# Patient Record
Sex: Female | Born: 1955 | Race: White | Hispanic: No | Marital: Married | Smoking: Former smoker
Health system: Southern US, Community
[De-identification: ages and names within clinical notes are randomized; demographics above are authoritative.]

## PROBLEM LIST (undated history)

## (undated) DIAGNOSIS — I491 Atrial premature depolarization: Secondary | ICD-10-CM

## (undated) DIAGNOSIS — J189 Pneumonia, unspecified organism: Secondary | ICD-10-CM

## (undated) DIAGNOSIS — M1990 Unspecified osteoarthritis, unspecified site: Secondary | ICD-10-CM

## (undated) DIAGNOSIS — R942 Abnormal results of pulmonary function studies: Secondary | ICD-10-CM

## (undated) DIAGNOSIS — G4730 Sleep apnea, unspecified: Secondary | ICD-10-CM

## (undated) DIAGNOSIS — B1920 Unspecified viral hepatitis C without hepatic coma: Secondary | ICD-10-CM

## (undated) DIAGNOSIS — T7840XA Allergy, unspecified, initial encounter: Secondary | ICD-10-CM

## (undated) DIAGNOSIS — F419 Anxiety disorder, unspecified: Secondary | ICD-10-CM

## (undated) DIAGNOSIS — E8770 Fluid overload, unspecified: Secondary | ICD-10-CM

## (undated) DIAGNOSIS — R0600 Dyspnea, unspecified: Secondary | ICD-10-CM

## (undated) DIAGNOSIS — K219 Gastro-esophageal reflux disease without esophagitis: Secondary | ICD-10-CM

## (undated) DIAGNOSIS — I7300 Raynaud's syndrome without gangrene: Secondary | ICD-10-CM

## (undated) DIAGNOSIS — R002 Palpitations: Secondary | ICD-10-CM

## (undated) DIAGNOSIS — M5126 Other intervertebral disc displacement, lumbar region: Secondary | ICD-10-CM

## (undated) DIAGNOSIS — D689 Coagulation defect, unspecified: Secondary | ICD-10-CM

## (undated) DIAGNOSIS — I82409 Acute embolism and thrombosis of unspecified deep veins of unspecified lower extremity: Secondary | ICD-10-CM

## (undated) DIAGNOSIS — G8929 Other chronic pain: Secondary | ICD-10-CM

## (undated) DIAGNOSIS — Z87891 Personal history of nicotine dependence: Secondary | ICD-10-CM

## (undated) DIAGNOSIS — M549 Dorsalgia, unspecified: Secondary | ICD-10-CM

## (undated) HISTORY — DX: Unspecified viral hepatitis C without hepatic coma: B19.20

## (undated) HISTORY — DX: Personal history of nicotine dependence: Z87.891

## (undated) HISTORY — DX: Unspecified osteoarthritis, unspecified site: M19.90

## (undated) HISTORY — DX: Coagulation defect, unspecified: D68.9

## (undated) HISTORY — DX: Atrial premature depolarization: I49.1

## (undated) HISTORY — DX: Abnormal results of pulmonary function studies: R94.2

## (undated) HISTORY — DX: Dorsalgia, unspecified: M54.9

## (undated) HISTORY — DX: Raynaud's syndrome without gangrene: I73.00

## (undated) HISTORY — DX: Anxiety disorder, unspecified: F41.9

## (undated) HISTORY — DX: Pneumonia, unspecified organism: J18.9

## (undated) HISTORY — DX: Acute embolism and thrombosis of unspecified deep veins of unspecified lower extremity: I82.409

## (undated) HISTORY — DX: Sleep apnea, unspecified: G47.30

## (undated) HISTORY — DX: Other chronic pain: G89.29

## (undated) HISTORY — DX: Palpitations: R00.2

## (undated) HISTORY — DX: Fluid overload, unspecified: E87.70

---

## 1983-05-20 HISTORY — PX: CHOLECYSTECTOMY: SHX55

## 1999-05-31 ENCOUNTER — Other Ambulatory Visit: Admission: RE | Admit: 1999-05-31 | Discharge: 1999-05-31 | Payer: Self-pay | Admitting: Family Medicine

## 2001-05-14 ENCOUNTER — Other Ambulatory Visit: Admission: RE | Admit: 2001-05-14 | Discharge: 2001-05-14 | Payer: Self-pay | Admitting: Family Medicine

## 2005-05-10 ENCOUNTER — Emergency Department (HOSPITAL_COMMUNITY): Admission: EM | Admit: 2005-05-10 | Discharge: 2005-05-11 | Payer: Self-pay | Admitting: Emergency Medicine

## 2006-07-20 ENCOUNTER — Ambulatory Visit: Payer: Self-pay | Admitting: Internal Medicine

## 2006-07-30 ENCOUNTER — Ambulatory Visit: Payer: Self-pay | Admitting: Internal Medicine

## 2006-12-03 ENCOUNTER — Telehealth (INDEPENDENT_AMBULATORY_CARE_PROVIDER_SITE_OTHER): Payer: Self-pay | Admitting: *Deleted

## 2006-12-08 ENCOUNTER — Encounter: Payer: Self-pay | Admitting: Internal Medicine

## 2008-02-17 ENCOUNTER — Encounter: Payer: Self-pay | Admitting: Internal Medicine

## 2008-06-27 ENCOUNTER — Ambulatory Visit: Payer: Self-pay | Admitting: Internal Medicine

## 2008-09-12 ENCOUNTER — Ambulatory Visit: Payer: Self-pay | Admitting: Internal Medicine

## 2008-09-13 ENCOUNTER — Ambulatory Visit: Payer: Self-pay | Admitting: Internal Medicine

## 2008-09-18 ENCOUNTER — Encounter (INDEPENDENT_AMBULATORY_CARE_PROVIDER_SITE_OTHER): Payer: Self-pay | Admitting: *Deleted

## 2009-03-02 ENCOUNTER — Ambulatory Visit: Payer: Self-pay | Admitting: Internal Medicine

## 2009-06-19 ENCOUNTER — Encounter: Payer: Self-pay | Admitting: Internal Medicine

## 2010-03-18 ENCOUNTER — Ambulatory Visit: Payer: Self-pay | Admitting: Genetic Counselor

## 2010-04-19 ENCOUNTER — Ambulatory Visit: Payer: Self-pay | Admitting: Internal Medicine

## 2010-05-24 ENCOUNTER — Encounter: Payer: Self-pay | Admitting: Internal Medicine

## 2010-05-24 ENCOUNTER — Ambulatory Visit
Admission: RE | Admit: 2010-05-24 | Discharge: 2010-05-24 | Payer: Self-pay | Source: Home / Self Care | Attending: Internal Medicine | Admitting: Internal Medicine

## 2010-05-24 ENCOUNTER — Other Ambulatory Visit: Payer: Self-pay | Admitting: Internal Medicine

## 2010-05-24 DIAGNOSIS — B182 Chronic viral hepatitis C: Secondary | ICD-10-CM | POA: Insufficient documentation

## 2010-05-24 LAB — LIPID PANEL
Cholesterol: 180 mg/dL (ref 0–200)
HDL: 37.5 mg/dL — ABNORMAL LOW (ref >39.00)
LDL Cholesterol: 118 mg/dL — ABNORMAL HIGH (ref 0–99)
Total CHOL/HDL Ratio: 5
Triglycerides: 123 mg/dL (ref 0.0–149.0)
VLDL: 24.6 mg/dL (ref 0.0–40.0)

## 2010-05-24 LAB — BASIC METABOLIC PANEL
BUN: 25 mg/dL — ABNORMAL HIGH (ref 6–23)
CO2: 29 mEq/L (ref 19–32)
Calcium: 9.3 mg/dL (ref 8.4–10.5)
Chloride: 104 mEq/L (ref 96–112)
Creatinine, Ser: 0.8 mg/dL (ref 0.4–1.2)
GFR: 79.45 mL/min (ref >60.00)
Glucose, Bld: 81 mg/dL (ref 70–99)
Potassium: 3.9 mEq/L (ref 3.5–5.1)
Sodium: 142 mEq/L (ref 135–145)

## 2010-05-24 LAB — CBC WITH DIFFERENTIAL/PLATELET
Basophils Absolute: 0 10*3/uL (ref 0.0–0.1)
Basophils Relative: 0.6 % (ref 0.0–3.0)
Eosinophils Absolute: 0 10*3/uL (ref 0.0–0.7)
Eosinophils Relative: 0.3 % (ref 0.0–5.0)
HCT: 38.8 % (ref 36.0–46.0)
Hemoglobin: 13.6 g/dL (ref 12.0–15.0)
Lymphocytes Relative: 49.2 % — ABNORMAL HIGH (ref 12.0–46.0)
Lymphs Abs: 2.9 10*3/uL (ref 0.7–4.0)
MCHC: 35 g/dL (ref 30.0–36.0)
MCV: 96 fl (ref 78.0–100.0)
Monocytes Absolute: 0.5 10*3/uL (ref 0.1–1.0)
Monocytes Relative: 8.1 % (ref 3.0–12.0)
Neutro Abs: 2.4 10*3/uL (ref 1.4–7.7)
Neutrophils Relative %: 41.8 % — ABNORMAL LOW (ref 43.0–77.0)
Platelets: 227 10*3/uL (ref 150.0–400.0)
RBC: 4.05 Mil/uL (ref 3.87–5.11)
RDW: 12.1 % (ref 11.5–14.6)
WBC: 5.8 10*3/uL (ref 4.5–10.5)

## 2010-05-24 LAB — TSH: TSH: 1.69 u[IU]/mL (ref 0.35–5.50)

## 2010-05-24 LAB — HEPATIC FUNCTION PANEL
ALT: 8 U/L (ref 0–35)
AST: 21 U/L (ref 0–37)
Albumin: 4.4 g/dL (ref 3.5–5.2)
Alkaline Phosphatase: 49 U/L (ref 39–117)
Bilirubin, Direct: 0.1 mg/dL (ref 0.0–0.3)
Total Bilirubin: 0.9 mg/dL (ref 0.3–1.2)
Total Protein: 6.9 g/dL (ref 6.0–8.3)

## 2010-05-27 ENCOUNTER — Telehealth: Payer: Self-pay | Admitting: Internal Medicine

## 2010-05-27 ENCOUNTER — Encounter: Payer: Self-pay | Admitting: Internal Medicine

## 2010-05-27 ENCOUNTER — Ambulatory Visit
Admission: RE | Admit: 2010-05-27 | Discharge: 2010-05-27 | Payer: Self-pay | Source: Home / Self Care | Attending: Internal Medicine | Admitting: Internal Medicine

## 2010-05-28 LAB — CONVERTED CEMR LAB

## 2010-05-30 LAB — CONVERTED CEMR LAB: HCV Ab: NEGATIVE

## 2010-06-16 LAB — CONVERTED CEMR LAB
ALT: 14 units/L (ref 0–40)
ALT: 9 units/L (ref 0–35)
AST: 21 units/L (ref 0–37)
AST: 26 units/L (ref 0–37)
Albumin: 4.2 g/dL (ref 3.5–5.2)
Albumin: 4.3 g/dL (ref 3.5–5.2)
Alkaline Phosphatase: 54 units/L (ref 39–117)
Alkaline Phosphatase: 62 units/L (ref 39–117)
BUN: 16 mg/dL (ref 6–23)
Basophils Absolute: 0 10*3/uL (ref 0.0–0.1)
Basophils Relative: 0.5 % (ref 0.0–3.0)
Bilirubin, Direct: 0.1 mg/dL (ref 0.0–0.3)
Bilirubin, Direct: 0.1 mg/dL (ref 0.0–0.3)
CO2: 28 meq/L (ref 19–32)
Calcium: 9.2 mg/dL (ref 8.4–10.5)
Chloride: 107 meq/L (ref 96–112)
Cholesterol: 147 mg/dL (ref 0–200)
Creatinine, Ser: 0.8 mg/dL (ref 0.4–1.2)
Eosinophils Absolute: 0 10*3/uL (ref 0.0–0.7)
Eosinophils Relative: 0.8 % (ref 0.0–5.0)
Free T4: 0.9 ng/dL (ref 0.6–1.6)
GFR calc non Af Amer: 79.96 mL/min (ref >60)
Glucose, Bld: 84 mg/dL (ref 70–99)
HCT: 38.8 % (ref 36.0–46.0)
HDL: 36.3 mg/dL — ABNORMAL LOW (ref >39.00)
Hemoglobin: 13.8 g/dL (ref 12.0–15.0)
Hgb A1c MFr Bld: 4.4 % — ABNORMAL LOW (ref 4.6–6.0)
LDL Cholesterol: 93 mg/dL (ref 0–99)
Lymphocytes Relative: 44.3 % (ref 12.0–46.0)
Lymphs Abs: 2.5 10*3/uL (ref 0.7–4.0)
MCHC: 35.7 g/dL (ref 30.0–36.0)
MCV: 98 fL (ref 78.0–100.0)
Monocytes Absolute: 0.5 10*3/uL (ref 0.1–1.0)
Monocytes Relative: 9.1 % (ref 3.0–12.0)
Neutro Abs: 2.7 10*3/uL (ref 1.4–7.7)
Neutrophils Relative %: 45.3 % (ref 43.0–77.0)
Platelets: 224 10*3/uL (ref 150.0–400.0)
Potassium: 4 meq/L (ref 3.5–5.1)
RBC: 3.96 M/uL (ref 3.87–5.11)
RDW: 11.9 % (ref 11.5–14.6)
Sodium: 141 meq/L (ref 135–145)
TSH: 2.64 microintl units/mL (ref 0.35–5.50)
Total Bilirubin: 0.9 mg/dL (ref 0.3–1.2)
Total Bilirubin: 1 mg/dL (ref 0.3–1.2)
Total CHOL/HDL Ratio: 4
Total Protein: 7 g/dL (ref 6.0–8.3)
Total Protein: 7.4 g/dL (ref 6.0–8.3)
Triglycerides: 91 mg/dL (ref 0.0–149.0)
VLDL: 18.2 mg/dL (ref 0.0–40.0)
Vit D, 1,25-Dihydroxy: 36 (ref 20–57)
WBC: 5.7 10*3/uL (ref 4.5–10.5)

## 2010-06-20 NOTE — Progress Notes (Signed)
Summary: Re-draw labs  Phone Note Call from Patient   Summary of Call: I spoke with [facility] labs and due to lab accident blood was not able to be used. Patient will have to come back in for re-draw.  I notified the patient and she will come now, her insurance runs out today. Lucious Groves CMA  May 27, 2010 1:00 PM

## 2010-06-20 NOTE — Assessment & Plan Note (Signed)
Summary: hep b vaccine, flu shot/kn  Nurse Visit   Allergies: 1)  ! Buckleys Cough (Dextromethorphan Hbr)  Immunizations Administered:  Hepatitis B Vaccine # 1:    Vaccine Type: HepB Adolescent    Site: right deltoid    Mfr: Merck    Dose: 1.0 ml    Route: IM    Given by: Jeremy Johann CMA    Exp. Date: 05/02/2012    Lot #: [id number]    VIS given: 12/03/05 version given April 19, 2010.  Orders Added: 1)  Admin 1st Vaccine [90471] 2)  Flu Vaccine 83yrs + [91478] 3)  Hepatitis B Vaccine ADOLESCENT (2 dose) [90743] 4)  Admin of Any Addtl Vaccine [90472] Flu Vaccine Consent Questions     Do you have a history of severe allergic reactions to this vaccine? no    Any prior history of allergic reactions to egg and/or gelatin? no    Do you have a sensitivity to the preservative Thimersol? no    Do you have a past history of Guillan-Barre Syndrome? no    Do you currently have an acute febrile illness? no    Have you ever had a severe reaction to latex? no    Vaccine information given and explained to patient? yes    Are you currently pregnant? no    Lot Number:[id number]   Exp Date:11/16/2010   Site Given  Left Deltoid IM .lbflu

## 2010-06-20 NOTE — Assessment & Plan Note (Signed)
Summary: cpx/will be fasting//kn   Vital Signs:  Patient profile:   55 year old female Height:      66.25 inches Weight:      131.6 pounds BMI:     21.16 Temp:     98.6 degrees F oral Pulse rate:   56 / minute Resp:     14 per minute BP sitting:   110 / 76  (left arm) Cuff size:   regular  Vitals Entered By: Shonna Chock CMA (May 24, 2010 11:41 AM)    History of Present Illness:    Rebecca Marks is here for a physical; she is asymptomatic.  Current Medications (verified): 1)  None  Allergies: 1)  ! 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 Post  Menopausal Status as per Dr Rob Bunting Lipoprofile : LDL 87 with 1321 total particles, 928 small dense particles. LDL goal = < 90.  CTS, bilateral, resolved with wrist splints.  Past Surgical History: Ruptured discs , no surgery ; G 3 P 0; Cholecystectomy  for cholelithiasis & cholecystitis Colonoscopy : none to date ; SOC reviewed  Family History: Father: lymphoma, DM,HTN,MI @ 26 Mother: breast cancer ,recurrent X 2,CVA, dementia Siblings:sister : breast cancer , GB disease; bro: migraines; MGM: [place]  Social History: Former Smoker: quit  08/07/2006 Occupation:  Nurse refresher program Alcohol use-no Regular exercise-yes: walks 3X/week  X 60 min  Review of Systems  The patient denies anorexia, fever, weight loss, weight gain, vision loss, decreased hearing, hoarseness, chest pain, syncope, dyspnea on exertion, peripheral edema, prolonged cough, headaches, hemoptysis, abdominal pain, melena, hematochezia, severe indigestion/heartburn, hematuria, suspicious skin lesions, depression, unusual weight change, abnormal bleeding, enlarged lymph nodes, and angioedema.     FOB & UA were WNL @ Gyn appt in 03/2010.  Physical Exam  General:  well-nourished;alert,appropriate and cooperative throughout examination Head:  Normocephalic and atraumatic without obvious abnormalities.  Eyes:  No corneal or conjunctival inflammation noted.  Perrla. Funduscopic exam benign, without hemorrhages, exudates or papilledema. No icterus Ears:  External ear exam shows no significant lesions or deformities.  Otoscopic examination reveals clear canals, tympanic membranes are intact bilaterally without bulging, retraction, inflammation or discharge. Hearing is grossly normal bilaterally. Nose:  External nasal examination shows no deformity or inflammation. Nasal mucosa are pink and moist without lesions or exudates. Mouth:  Oral mucosa and oropharynx without lesions or exudates.  Teeth in good repair; wearing a "flipper". Neck:  No deformities, masses, or tenderness noted. Lungs:  Normal respiratory effort, chest expands symmetrically. Lungs are clear to auscultation, no crackles or wheezes. Heart:  Normal rate and regular rhythm. S1 and S2 normal without gallop, murmur, click, rub . Soft S4 Abdomen:  Bowel sounds positive,abdomen soft and non-tender without masses, organomegaly or hernias noted. Genitalia:  Dr Jennette Kettle Msk:  No deformity or scoliosis noted of thoracic or lumbar spine.   Pulses:  R and L carotid,radial,dorsalis pedis and posterior tibial pulses are full and equal bilaterally Extremities:  No clubbing, cyanosis, edema, or deformity noted with normal full range of motion of all joints.   Neurologic:  alert & oriented X3 and DTRs symmetrical and normal.   Skin:  Intact without suspicious lesions or rashes. No jaundice Cervical Nodes:  No  lymphadenopathy noted Axillary Nodes:  No palpable lymphadenopathy Psych:  memory intact for recent and remote, normally interactive, and good eye contact.     Impression & Recommendations:  Problem # 1:  ROUTINE GENERAL MEDICAL  [email or web address]@[email or web address]  CARE FACL (ICD-V70.0)  Orders: EKG w/ Interpretation (93000) Venipuncture (21308) TLB-Lipid Panel (80061-LIPID) TLB-BMP (Basic Metabolic Panel-BMET) (80048-METABOL) TLB-CBC Platelet - w/Differential (85025-CBCD) TLB-Hepatic/Liver Function Pnl (80076-HEPATIC) TLB-TSH (Thyroid Stimulating Hormone) (84443-TSH) T-Hepatitis C Viral Load (65784-69629) T-Hepatitis C Antibody (52841-32440)  Problem # 2:  HEPATITIS C, HX OF (ICD-V12.09) TRANSITORY + serology; F/U NEGATIVE serology  Other Orders: Hepatitis B Vaccine ADOLESCENT (2 dose) (10272) Admin 1st Vaccine (53664)  Patient Instructions: 1)  Schedule a colonoscopy  to help detect colon cancer as per [facility] as discussed.   Orders Added: 1)  Est. Patient 40-64 years [99396] 2)  EKG w/ Interpretation [93000] 3)  Venipuncture [36415] 4)  TLB-Lipid Panel [80061-LIPID] 5)  TLB-BMP (Basic Metabolic Panel-BMET) [80048-METABOL] 6)  TLB-CBC Platelet - w/Differential [85025-CBCD] 7)  TLB-Hepatic/Liver Function Pnl [80076-HEPATIC] 8)  TLB-TSH (Thyroid Stimulating Hormone) [84443-TSH] 9)  T-Hepatitis C Viral Load [40347-42595] 10)  T-Hepatitis C Antibody [63875-64332] 11)  Hepatitis B Vaccine ADOLESCENT (2 dose) [90743] 12)  Admin 1st Vaccine [90471]   Immunization History:  Tetanus/Td Immunization History:    Tetanus/Td:  historical (05/19/2000)  Immunizations Administered:  Hepatitis B Vaccine # 2:    Vaccine Type: HepB Adolescent    Site: left deltoid    Mfr: Merck    Dose: 1.0 ml    Route: IM    Given by: Lucious Groves CMA    Exp. Date: 05/02/2012    Lot #: [id number]    VIS given: 12/03/05 version given May 24, 2010.   Immunization History:  Tetanus/Td Immunization History:    Tetanus/Td:  Historical (05/19/2000)  Immunizations Administered:  Hepatitis B Vaccine # 2:    Vaccine Type: HepB Adolescent    Site: left deltoid    Mfr: Merck    Dose: 1.0 ml    Route: IM    Given by: Lucious Groves CMA    Exp.  Date: 05/02/2012    Lot #: [id number]    VIS given: 12/03/05 version given May 24, 2010.

## 2010-10-01 ENCOUNTER — Encounter: Payer: Self-pay | Admitting: Internal Medicine

## 2010-12-12 ENCOUNTER — Ambulatory Visit (INDEPENDENT_AMBULATORY_CARE_PROVIDER_SITE_OTHER)
Admission: RE | Admit: 2010-12-12 | Discharge: 2010-12-12 | Disposition: A | Discharge status: Home / Self Care | Payer: BC Managed Care – PPO | Source: Ambulatory Visit | Attending: Internal Medicine | Admitting: Internal Medicine

## 2010-12-12 ENCOUNTER — Ambulatory Visit (INDEPENDENT_AMBULATORY_CARE_PROVIDER_SITE_OTHER): Payer: BC Managed Care – PPO | Admitting: Internal Medicine

## 2010-12-12 ENCOUNTER — Encounter: Payer: Self-pay | Admitting: Internal Medicine

## 2010-12-12 VITALS — BP 112/70 | HR 72 | Temp 99.2°F | Wt 131.0 lb

## 2010-12-12 DIAGNOSIS — M25561 Pain in right knee: Secondary | ICD-10-CM

## 2010-12-12 DIAGNOSIS — M25569 Pain in unspecified knee: Secondary | ICD-10-CM

## 2010-12-12 MED ORDER — TRAMADOL HCL 50 MG PO TABS: 50.0000 mg | ORAL_TABLET | Freq: Four times a day (QID) | ORAL | Status: DC | PRN
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
## 2010-12-12 NOTE — Patient Instructions (Signed)
Order  for the x-ray has been entered into the computer; no appointment needed. The films will be made at [address] across  From [facility].

## 2011-01-23 ENCOUNTER — Telehealth: Payer: Self-pay

## 2011-01-23 DIAGNOSIS — M25569 Pain in unspecified knee: Secondary | ICD-10-CM

## 2011-01-23 NOTE — Telephone Encounter (Signed)
Pt states she saw Dr. Alwyn Ren in July 2012 when pt hurt her right knee on vacation.  Pt states she believes she could possibly have a tear in her knee and would like to have a referral to an orthopedic specialist.  Pt request Dr. Otelia Sergeant if he is still practicing.  Pls advise.

## 2011-01-24 NOTE — Telephone Encounter (Signed)
OK , please schedule (Dx: knee pain, protracted)

## 2011-01-24 NOTE — Telephone Encounter (Signed)
Referral placed.

## 2011-03-11 ENCOUNTER — Encounter: Payer: Self-pay | Admitting: Internal Medicine

## 2011-03-11 ENCOUNTER — Ambulatory Visit (INDEPENDENT_AMBULATORY_CARE_PROVIDER_SITE_OTHER): Payer: BC Managed Care – PPO | Admitting: Internal Medicine

## 2011-03-11 VITALS — BP 124/80 | HR 79 | Temp 99.0°F | Wt 126.4 lb

## 2011-03-11 DIAGNOSIS — J209 Acute bronchitis, unspecified: Secondary | ICD-10-CM

## 2011-03-11 MED ORDER — AZITHROMYCIN 250 MG PO TABS: ORAL_TABLET | ORAL | Status: DC

## 2011-03-11 MED ORDER — HYDROCODONE-HOMATROPINE 5-1.5 MG/5ML PO SYRP: 5.0000 mL | ORAL_SOLUTION | Freq: Four times a day (QID) | ORAL | Status: DC | PRN

## 2011-03-11 NOTE — Progress Notes (Signed)
  Subjective:    Patient ID: Rebecca Marks, female    DOB: 10/20/1955, 55 y.o.   MRN: [id number]  HPI Respiratory tract infection Onset/symptoms:10/18 as nasal congestion Exposures (illness/environmental/extrinsic):husband ill with cold last week Progression of symptoms:upper chest congestion Treatments/response:night time decongestant Present symptoms: Fever/chills/sweats:low grade fever Frontal headache:not for 2 days Facial pain:no Nasal purulence:scant , resolving Sore throat:scratchy Dental pain:no Lymphadenopathy:no Wheezing/shortness of breath:no Cough/sputum/hemoptysis:essentially NP   Associated extrinsic/allergic symptoms:itchy eyes/ sneezing:no Past medical history: Seasonal allergies/asthma:PMH of bronchitis Smoking history:07/2006           Review of Systems     Objective:   Physical Exam General appearance is of good health and nourishment; no acute distress or increased work of breathing is present.  No  lymphadenopathy about the head, neck, or axilla noted.   Eyes: No conjunctival inflammation or lid edema is present.   Ears:  External ear exam shows no significant lesions or deformities.  Otoscopic examination reveals clear canals, tympanic membranes are intact bilaterally without bulging, retraction, inflammation or discharge.  Nose:  External nasal examination shows no deformity or inflammation. Nasal mucosa are pink and moist without lesions or exudates. No septal dislocation.No obstruction to airflow.   Oral exam: Dental hygiene is good; lips and gums are healthy appearing.There is no oropharyngeal erythema or exudate noted.   Neck:  No deformities, thyromegaly, masses, or tenderness noted.    Heart:  Normal rate and regular rhythm. S1 and S2 normal without gallop, murmur, click, rub or other extra sounds.   Lungs:Chest clear to auscultation; no wheezes, rhonchi,rales ,or rubs present.No increased work of breathing.    Extremities:  No  cyanosis, edema, or clubbing  noted    Skin: Warm & dry           Assessment & Plan:

## 2011-03-11 NOTE — Patient Instructions (Signed)
Force NON dairy fluids for next 48 hrs. Use a Neti pot daily as needed for sinus congestion

## 2011-05-15 ENCOUNTER — Ambulatory Visit (INDEPENDENT_AMBULATORY_CARE_PROVIDER_SITE_OTHER): Payer: BC Managed Care – PPO | Admitting: Internal Medicine

## 2011-05-15 ENCOUNTER — Encounter: Payer: Self-pay | Admitting: Internal Medicine

## 2011-05-15 VITALS — BP 100/60 | HR 89 | Temp 98.6°F | Resp 18 | Wt 125.0 lb

## 2011-05-15 DIAGNOSIS — R059 Cough, unspecified: Secondary | ICD-10-CM

## 2011-05-15 DIAGNOSIS — J209 Acute bronchitis, unspecified: Secondary | ICD-10-CM

## 2011-05-15 DIAGNOSIS — R05 Cough: Secondary | ICD-10-CM

## 2011-05-15 DIAGNOSIS — J111 Influenza due to unidentified influenza virus with other respiratory manifestations: Secondary | ICD-10-CM

## 2011-05-15 LAB — POCT INFLUENZA A/B

## 2011-05-15 MED ORDER — OSELTAMIVIR PHOSPHATE 75 MG PO CAPS: 75.0000 mg | ORAL_CAPSULE | Freq: Two times a day (BID) | ORAL | Status: AC

## 2011-05-15 MED ORDER — HYDROCODONE-HOMATROPINE 5-1.5 MG/5ML PO SYRP: 5.0000 mL | ORAL_SOLUTION | Freq: Four times a day (QID) | ORAL | Status: AC | PRN

## 2011-05-16 ENCOUNTER — Encounter: Payer: Self-pay | Admitting: Internal Medicine

## 2011-05-16 DIAGNOSIS — J111 Influenza due to unidentified influenza virus with other respiratory manifestations: Secondary | ICD-10-CM | POA: Insufficient documentation

## 2011-05-16 NOTE — Assessment & Plan Note (Signed)
Flu swab +. Begin tamiflu. Followup if no improvement or worsening.

## 2011-05-16 NOTE — Progress Notes (Signed)
  Subjective:    Patient ID: Rebecca Marks, female    DOB: 1955-12-10, 55 y.o.   MRN: [id number]  HPI Pt presents to clinic for evaluation of cough. Notes two day h/o fever tmax 100.6, np cough and head congestion. Cough is disturbing sleep. No alleviating or exacerbating factors. No other complaints.  No past medical history on file. No past surgical history on file.  reports that she quit smoking about 4 years ago. She has never used smokeless tobacco. She reports that she uses illicit drugs. She reports that she does not drink alcohol. family history is not on file. Allergies  Allergen Reactions  . Dextromethorphan     "drunk"; ataxic     Review of Systems see hpi     Objective:   Physical Exam  Nursing note and vitals reviewed. Constitutional: She appears well-developed and well-nourished. No distress.  HENT:  Head: Normocephalic and atraumatic.  Right Ear: Tympanic membrane, external ear and ear canal normal.  Left Ear: Tympanic membrane, external ear and ear canal normal.  Nose: Nose normal.  Mouth/Throat: Oropharynx is clear and moist. No oropharyngeal exudate.  Eyes: Conjunctivae are normal. Right eye exhibits no discharge. Left eye exhibits no discharge. No scleral icterus.  Neck: Neck supple.  Pulmonary/Chest: Effort normal and breath sounds normal. No respiratory distress. She has no wheezes. She has no rales.  Skin: Skin is warm and dry. She is not diaphoretic.  Psychiatric: She has a normal mood and affect.          Assessment & Plan:

## 2011-08-14 ENCOUNTER — Encounter: Payer: Self-pay | Admitting: Internal Medicine

## 2011-08-14 ENCOUNTER — Ambulatory Visit (INDEPENDENT_AMBULATORY_CARE_PROVIDER_SITE_OTHER): Payer: BC Managed Care – PPO | Admitting: Internal Medicine

## 2011-08-14 VITALS — BP 112/76 | HR 71 | Temp 98.0°F | Wt 120.2 lb

## 2011-08-14 DIAGNOSIS — M542 Cervicalgia: Secondary | ICD-10-CM

## 2011-08-14 DIAGNOSIS — Z8619 Personal history of other infectious and parasitic diseases: Secondary | ICD-10-CM

## 2011-08-14 DIAGNOSIS — I491 Atrial premature depolarization: Secondary | ICD-10-CM

## 2011-08-14 NOTE — Progress Notes (Signed)
  Subjective:    Patient ID: Rebecca Marks, female    DOB: 1955/10/01, 56 y.o.   MRN: [id number]  HPI For 3 weeks she describes almost constant, 24/7 pressure in the  anterior neck bilaterally. She questions whether this is related to life stresses. 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  She denies chest pain or exertional palpitations but does describe a "flop" at night at rest. She denies a constellation of headache, chest pain, flushing, diarrhea. She's had some pressure type headaches which she attributes to the seasonal environmental changes.  She denies depression or panic attacks.    Review of Systems She describes easy fatigability.  She has a remote history of hepatitis C; she denies abdominal pain, melena, rectal bleeding, dark  urine or clay colored stool.  She also  denies cough , sputum production or dyspnea     Objective:   Physical Exam  Gen.: Thin but well-nourished; in no acute distress Eyes: Extraocular motion intact; no lid lag or proptosis. No icterus  Neck: Full range of motion; thyroid normal  Heart: Normal rhythm and rate without significant murmur, gallop. S 4  Vascular: All pulses intact; no bruits present. Aorta is palpable but there is no aneurysm Lungs: Chest clear to auscultation without rales,rales, wheezes  Abdomen: Scaphoid; aorta is palpable without enlargement. No organomegaly or masses Neuro:Deep tendon reflexes are equal and within normal limits; no tremor  Skin: Warm and dry without significant lesions or rashes. No jaundice Psych: Normally communicative and interactive; no abnormal mood or affect clinically.         Assessment & Plan:  #1 neck discomfort; this is most likely related to exogenous stresses she describes. There is no suggestion of  carotid artery disease or thyroiditis.  #2 PACs on EKG; no ischemic changes present # 3 negative past medical history except for  hepatitis C from contaminated blood.  Plan: See orders and recommendations

## 2011-08-14 NOTE — Progress Notes (Signed)
Addended by: Silvio Pate D on: 08/14/2011 03:15 PM   Modules accepted: Orders

## 2011-08-14 NOTE — Patient Instructions (Addendum)
Please call if you would want to consider meds to address stress.Preventive Health Care: Exercise  30-45  minutes a day, 3-4 days a week. Walking is especially valuable in preventing Osteoporosis. Eat a low-fat diet with lots of fruits and vegetables, up to 7-9 servings per day. Consume less than 30 grams of sugar per day from foods & drinks with High Fructose Corn Syrup as # 1,2,3 or #4 on label. Depression is common in our stressful world. If you're feeling down or losing interest in things you normally enjoy, please call. Violence - If anyone is threatening or hurting you, please call immediately.  To prevent palpitations or premature beats, avoid stimulants such as decongestants, diet pills, nicotine, or caffeine (coffee, tea, cola, or chocolate) to excess.

## 2011-08-15 LAB — SEDIMENTATION RATE: Sed Rate: 1 mm/hr (ref 0–22)

## 2011-11-17 ENCOUNTER — Encounter: Payer: Self-pay | Admitting: Internal Medicine

## 2012-03-18 ENCOUNTER — Encounter: Payer: Self-pay | Admitting: Family Medicine

## 2012-03-18 ENCOUNTER — Ambulatory Visit (INDEPENDENT_AMBULATORY_CARE_PROVIDER_SITE_OTHER): Payer: BC Managed Care – PPO | Admitting: Family Medicine

## 2012-03-18 VITALS — BP 124/72 | HR 74 | Temp 98.1°F | Ht 66.25 in | Wt 123.2 lb

## 2012-03-18 DIAGNOSIS — J209 Acute bronchitis, unspecified: Secondary | ICD-10-CM

## 2012-03-18 DIAGNOSIS — J329 Chronic sinusitis, unspecified: Secondary | ICD-10-CM

## 2012-03-18 MED ORDER — AZITHROMYCIN 250 MG PO TABS: ORAL_TABLET | ORAL | Status: AC

## 2012-03-18 NOTE — Patient Instructions (Addendum)
This appears to be a sinus infection Start the Zpack as directed Drink plenty of fluids REST! Hang in there!!!!

## 2012-03-18 NOTE — Progress Notes (Signed)
  Subjective:    Patient ID: Rebecca Marks, female    DOB: 03-15-1956, 56 y.o.   MRN: [id number]  HPI ? Sinus infxn- had to ride 4 planes over the weekend, having facial pain/pressure.  + HA.  No tooth pain.  Bilateral ear fullness.  Has been using netti pot w/out relief.  sxs started Monday.  No fevers.  + fatigue.  No nausea.  + sick contacts.  + PND causing mild cough.  Hx of similar- reports typically improves w/ 2 rounds of zpacks.   Review of Systems For ROS see HPI     Objective:   Physical Exam  Vitals reviewed. Constitutional: She appears well-developed and well-nourished. No distress.  HENT:  Head: Normocephalic and atraumatic.  Right Ear: Tympanic membrane normal.  Left Ear: Tympanic membrane normal.  Nose: Mucosal edema and rhinorrhea present. Right sinus exhibits maxillary sinus tenderness and frontal sinus tenderness. Left sinus exhibits maxillary sinus tenderness and frontal sinus tenderness.  Mouth/Throat: Uvula is midline and mucous membranes are normal. Posterior oropharyngeal erythema present. No oropharyngeal exudate.  Eyes: Conjunctivae normal and EOM are normal. Pupils are equal, round, and reactive to light.  Neck: Normal range of motion. Neck supple.  Cardiovascular: Normal rate, regular rhythm and normal heart sounds.   Pulmonary/Chest: Effort normal and breath sounds normal. No respiratory distress. She has no wheezes.  Lymphadenopathy:    She has no cervical adenopathy.          Assessment & Plan:

## 2012-03-18 NOTE — Assessment & Plan Note (Signed)
New.  Pt's sxs and PE consistent w/ infxn.  Start abx.  Reviewed supportive care and red flags that should prompt return.  Pt expressed understanding and is in agreement w/ plan.  

## 2012-07-03 ENCOUNTER — Other Ambulatory Visit: Payer: Self-pay

## 2012-08-17 ENCOUNTER — Telehealth: Payer: Self-pay | Admitting: Internal Medicine

## 2012-08-17 ENCOUNTER — Ambulatory Visit (INDEPENDENT_AMBULATORY_CARE_PROVIDER_SITE_OTHER): Payer: BC Managed Care – PPO | Admitting: Family Medicine

## 2012-08-17 VITALS — BP 102/70 | HR 66 | Temp 98.1°F | Resp 16 | Ht 66.25 in | Wt 122.0 lb

## 2012-08-17 DIAGNOSIS — L209 Atopic dermatitis, unspecified: Secondary | ICD-10-CM

## 2012-08-17 DIAGNOSIS — N859 Noninflammatory disorder of uterus, unspecified: Secondary | ICD-10-CM

## 2012-08-17 MED ORDER — METHYLPREDNISOLONE 4 MG PO TABS: ORAL_TABLET | ORAL | Status: DC

## 2012-08-17 NOTE — Telephone Encounter (Signed)
Opened in error. BC °

## 2012-08-17 NOTE — Progress Notes (Signed)
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  Rash has spread to the back.  Objective:  Diffuse mild erythematous rash abdomen and, to lesser extent, lower back.  Assessment:  Atopic dermatitis Atopic dermatitis - Plan: methylPREDNISolone (MEDROL) 4 MG tablet

## 2012-09-09 ENCOUNTER — Emergency Department (HOSPITAL_COMMUNITY)
Admission: EM | Admit: 2012-09-09 | Discharge: 2012-09-09 | Disposition: A | Discharge status: Home / Self Care | Payer: BC Managed Care – PPO | Attending: Emergency Medicine | Admitting: Emergency Medicine

## 2012-09-09 ENCOUNTER — Encounter (HOSPITAL_COMMUNITY): Payer: Self-pay | Admitting: Emergency Medicine

## 2012-09-09 DIAGNOSIS — R109 Unspecified abdominal pain: Secondary | ICD-10-CM | POA: Insufficient documentation

## 2012-09-09 DIAGNOSIS — R112 Nausea with vomiting, unspecified: Secondary | ICD-10-CM | POA: Insufficient documentation

## 2012-09-09 DIAGNOSIS — F17200 Nicotine dependence, unspecified, uncomplicated: Secondary | ICD-10-CM | POA: Insufficient documentation

## 2012-09-09 DIAGNOSIS — R197 Diarrhea, unspecified: Secondary | ICD-10-CM | POA: Insufficient documentation

## 2012-09-09 DIAGNOSIS — Z9089 Acquired absence of other organs: Secondary | ICD-10-CM | POA: Insufficient documentation

## 2012-09-09 DIAGNOSIS — Z8619 Personal history of other infectious and parasitic diseases: Secondary | ICD-10-CM | POA: Insufficient documentation

## 2012-09-09 DIAGNOSIS — Z79899 Other long term (current) drug therapy: Secondary | ICD-10-CM | POA: Insufficient documentation

## 2012-09-09 LAB — CBC WITH DIFFERENTIAL/PLATELET
Eosinophils Absolute: 0 10*3/uL (ref 0.0–0.7)
Hemoglobin: 14.9 g/dL (ref 12.0–15.0)
Lymphocytes Relative: 3 % — ABNORMAL LOW (ref 12–46)
Lymphs Abs: 0.5 10*3/uL — ABNORMAL LOW (ref 0.7–4.0)
MCH: 33.9 pg (ref 26.0–34.0)
MCV: 90.7 fL (ref 78.0–100.0)
Monocytes Relative: 6 % (ref 3–12)
Neutrophils Relative %: 91 % — ABNORMAL HIGH (ref 43–77)
Platelets: 213 10*3/uL (ref 150–400)
RBC: 4.39 MIL/uL (ref 3.87–5.11)
WBC: 17.9 10*3/uL — ABNORMAL HIGH (ref 4.0–10.5)

## 2012-09-09 LAB — COMPREHENSIVE METABOLIC PANEL
ALT: 5 U/L (ref 0–35)
Alkaline Phosphatase: 50 U/L (ref 39–117)
BUN: 24 mg/dL — ABNORMAL HIGH (ref 6–23)
CO2: 27 mEq/L (ref 19–32)
GFR calc Af Amer: 81 mL/min — ABNORMAL LOW (ref >90)
GFR calc non Af Amer: 70 mL/min — ABNORMAL LOW (ref >90)
Glucose, Bld: 150 mg/dL — ABNORMAL HIGH (ref 70–99)
Potassium: 4.3 mEq/L (ref 3.5–5.1)
Sodium: 142 mEq/L (ref 135–145)
Total Bilirubin: 0.8 mg/dL (ref 0.3–1.2)

## 2012-09-09 LAB — LIPASE, BLOOD: Lipase: 21 U/L (ref 11–59)

## 2012-09-09 MED ORDER — ONDANSETRON 4 MG PO TBDP: 4.0000 mg | ORAL_TABLET | Freq: Four times a day (QID) | ORAL | Status: DC | PRN

## 2012-09-09 MED ORDER — LOPERAMIDE HCL 2 MG PO CAPS: 2.0000 mg | ORAL_CAPSULE | Freq: Four times a day (QID) | ORAL | Status: DC | PRN

## 2012-09-09 NOTE — ED Notes (Signed)
Pt states that at approx. 9:30 tonight she became sick with n/v/d. Husband states that he ate at same restaurant the other day and became sick as well.

## 2012-09-09 NOTE — ED Notes (Signed)
ZOX:WR60<AV> Expected date:09/09/12<BR> Expected time:12:39 AM<BR> Means of arrival:Ambulance<BR> Comments:<BR> 57 yo F  N/V/D

## 2012-09-09 NOTE — ED Provider Notes (Signed)
History     CSN: [id number]  Arrival date & time 09/09/12  0047   First MD Initiated Contact with Patient 09/09/12 0210      Chief Complaint  Patient presents with  . Emesis  . Diarrhea    (Consider location/radiation/quality/duration/timing/severity/associated sxs/prior treatment) HPI Rebecca Marks is a 57 y.o. female who says that she became acutely ill with vomiting, lower abdominal cramps and diarrhea about 2 and half hours after eating a shrimp omelette. Patient's husband said he was recently sick after eating seafood from the same location at the fresh market.  Patient received a dose of Zofran and now feels much better. She has received some fluids and feels much better. Her abdominal cramping was in the lower quadrants, it was severe, crampy, constant, relieved somewhat with diarrhea.  No other alleviating or exacerbating factors and no other associated symptoms. Denies any chest pains, shortness of breath, fevers, chills, lightheadedness or dizziness. She is an [employer].  Past Medical History  Diagnosis Date  . Hepatitis C     contaminated blood @ her job    Past Surgical History  Procedure Laterality Date  . Cholescystectomy    . G 0 p 0    . Herniated disc    . Cholecystectomy      Family History  Problem Relation Age of Onset  . Diabetes Father   . Hypertension Father   . Heart disease Father   . Lymphoma Father   . Stroke Mother   . Breast cancer Mother      X 3  . Cancer Mother     breast  . Cancer Sister     breast  . Alzheimer's disease Maternal Grandmother   . Stroke Maternal Grandfather     History  Substance Use Topics  . Smoking status: Current Every Day Smoker -- 0.25 packs/day  . Smokeless tobacco: Never Used     Comment: Recently started back Jan 2013.   [name] Alcohol Use: No    OB History   Grav Para Term Preterm Abortions TAB SAB Ect Mult Living                  Review of Systems At least 10pt or greater review of systems completed  and are negative except where specified in the HPI.  Allergies  Dextromethorphan  Home Medications   Current Outpatient Rx  Name  Route  Sig  Dispense  Refill  . Glucosamine-Chondroit-Vit C-Mn (GLUCOSAMINE CHONDR 1500 COMPLX PO)   Oral   Take 1,500 mg by mouth at bedtime.         . Multiple Vitamin (MULTIVITAMIN) tablet   Oral   Take 1 tablet by mouth daily.         [name] PHOSPHATIDYLSERINE PO   Oral   Take 200 mg by mouth 2 (two) times daily.         [name] UNABLE TO FIND   Oral   Take 4,000 Units by mouth daily. Vit D         . UNABLE TO FIND   Oral   Take 1,000 mg by mouth daily. Ionic Calcium           BP 101/52  Pulse 70  Temp(Src) 97.6 F (36.4 C) (Oral)  SpO2 100%  Physical Exam  Nursing notes reviewed.  Electronic medical record reviewed. VITAL SIGNS:   Filed Vitals:   09/09/12 0049 09/09/12 0104 09/09/12 0253  BP: 117/87 101/52 115/60  Pulse: 72 70 86  Temp:  97.6 F (36.4 C)   TempSrc:  Oral   Resp:   18  SpO2: 99% 100% 100%   CONSTITUTIONAL: Awake, oriented, appears non-toxic HENT: Atraumatic, normocephalic, oral mucosa pink and moist, airway patent. Nares patent without drainage. External ears normal. EYES: Conjunctiva clear, EOMI, PERRLA NECK: Trachea midline, non-tender, supple CARDIOVASCULAR: Normal heart rate, Normal rhythm, No murmurs, rubs, gallops PULMONARY/CHEST: Clear to auscultation, no rhonchi, wheezes, or rales. Symmetrical breath sounds. Non-tender. ABDOMINAL: Non-distended, soft, non-tender - no rebound or guarding.  BS normal. NEUROLOGIC: Non-focal, moving all four extremities, no gross sensory or motor deficits. EXTREMITIES: No clubbing, cyanosis, or edema SKIN: Warm, Dry, No erythema, No rash  ED Course  Procedures (including critical care time)  Labs Reviewed  CBC WITH DIFFERENTIAL - Abnormal; Notable for the following:    WBC 17.9 (*)    MCHC 37.4 (*)    Neutrophils Relative 91 (*)    Neutro Abs 16.3 (*)     Lymphocytes Relative 3 (*)    Lymphs Abs 0.5 (*)    Monocytes Absolute 1.1 (*)    All other components within normal limits  COMPREHENSIVE METABOLIC PANEL - Abnormal; Notable for the following:    Glucose, Bld 150 (*)    BUN 24 (*)    GFR calc non Af Amer 70 (*)    GFR calc Af Amer 81 (*)    All other components within normal limits  LIPASE, BLOOD   No results found.   1. Nausea vomiting and diarrhea   2. Abdominal cramps       MDM  Labs obtained in triage - at this point I think her illness is likely caused by either a preformed toxin ingestion versus a viral gastroenteritis with a coincidently ingestion of seafood. She said the seafood did not smell bad, and said it was fully cooked. Do not think the urinalysis is indicated at this time. White count likely elevated from vomiting, patient's abdomen is benign at this time I do not suspect dysentery without fever, no bloody diarrhea. Patient has mild dehydration 3 hydrated with 2 L of IV fluids and will be able to maintain by mouth hydration.  I did discuss the possibility of a short course of antibiotics with the patient, but given my low clinical suspicion for vibrio spp, or any other invasive bacterial dysentery, patient declines.  Discharge the patient home stable and in good condition with antiemetics and antidiarrheal. Patient to take antidiarrheal should diarrhea continue and will not take it with bloody stools or fevers over return to the emergency department for that or any other concerning symptoms.  Patient acknowledges medical plan, understands her questions have been answered.    Jones Skene, MD 09/09/12 2207

## 2012-12-08 ENCOUNTER — Other Ambulatory Visit: Payer: Self-pay | Admitting: Obstetrics & Gynecology

## 2012-12-08 DIAGNOSIS — Z803 Family history of malignant neoplasm of breast: Secondary | ICD-10-CM

## 2012-12-13 ENCOUNTER — Ambulatory Visit (INDEPENDENT_AMBULATORY_CARE_PROVIDER_SITE_OTHER): Payer: BC Managed Care – PPO | Admitting: Family Medicine

## 2012-12-13 VITALS — BP 118/64 | HR 80 | Temp 98.0°F | Resp 18 | Ht 66.25 in | Wt 118.4 lb

## 2012-12-13 DIAGNOSIS — J019 Acute sinusitis, unspecified: Secondary | ICD-10-CM

## 2012-12-13 MED ORDER — AZITHROMYCIN 250 MG PO TABS: ORAL_TABLET | ORAL | Status: DC

## 2012-12-13 MED ORDER — PHENYLEPH-PROMETHAZINE-COD 5-6.25-10 MG/5ML PO SYRP: 5.0000 mL | ORAL_SOLUTION | Freq: Four times a day (QID) | ORAL | Status: DC | PRN

## 2012-12-13 NOTE — Progress Notes (Signed)
Subjective:    Patient ID: Rebecca Marks, female    DOB: 01-10-1956, 57 y.o.   MRN: [id number] Chief Complaint  Patient presents with  . Sinusitis  . Cough    when laying down  . Sore Throat  . Fever    lastnight   HPI   Is having pressue in her head, temp up to 100 last night, has a lot of congestion but no trouble breathing - sinus pressure with PND causing cough keeping her from sleep. Cough largely non-productive.  Not getting any mucous out of her nose though keeps trying to blow it - just all draining down her throat.  Has been having a lot of chills - cold intolerance.  No ear pain or jaw pain, though ears been popping and unsure about hearing changes.  All just started 2-3d ago. Does not have any meds at home to try for this - has only tried tylenol.   Does netti pot daily.  Cat has mouth cancer and mother in law with pneumonia.  Has had 6 airplane trips 1 wk prior to her sxs beginning. Has a h/o freq sinus infections every summer and always takes 2 zpacks to treat - waits a few days after the first and then always has a secondary worsening and needs a refill. States she knows her own body very well so I should just listen to her. Is a retired [employer] fundraiser.  Past Medical History  Diagnosis Date  . Hepatitis C     contaminated blood @ her job   Current Outpatient Prescriptions on File Prior to Visit  Medication Sig Dispense Refill  . Glucosamine-Chondroit-Vit C-Mn (GLUCOSAMINE CHONDR 1500 COMPLX PO) Take 1,500 mg by mouth at bedtime.      . Multiple Vitamin (MULTIVITAMIN) tablet Take 1 tablet by mouth daily.      [name] PHOSPHATIDYLSERINE PO Take 200 mg by mouth 2 (two) times daily.      [name] loperamide (IMODIUM) 2 MG capsule Take 1 capsule (2 mg total) by mouth 4 (four) times daily as needed for diarrhea or loose stools.  12 capsule  0  . ondansetron (ZOFRAN ODT) 4 MG disintegrating tablet Take 1 tablet (4 mg total) by mouth every 6 (six) hours as needed for nausea.  10 tablet  0  . UNABLE TO  FIND Take 4,000 Units by mouth daily. Vit D      . UNABLE TO FIND Take 1,000 mg by mouth daily. Ionic Calcium       No current facility-administered medications on file prior to visit.   Allergies  Allergen Reactions  . Dextromethorphan     "drunk"; ataxic     Review of Systems  Constitutional: Positive for fever, chills, diaphoresis and fatigue. Negative for activity change and appetite change.  HENT: Positive for congestion, sore throat, postnasal drip and sinus pressure. Negative for ear pain, nosebleeds, rhinorrhea, sneezing, mouth sores, trouble swallowing, neck pain, neck stiffness, dental problem, voice change and ear discharge.   Eyes: Negative for discharge and itching.  Respiratory: Positive for cough. Negative for shortness of breath.   Cardiovascular: Negative for chest pain.  Gastrointestinal: Negative for nausea, vomiting and abdominal pain.  Skin: Negative for rash.  Neurological: Positive for headaches. Negative for dizziness and syncope.  Hematological: Negative for adenopathy.  Psychiatric/Behavioral: Positive for sleep disturbance.      BP 118/64  Pulse 80  Temp(Src) 98 F (36.7 C) (Oral)  Resp 18  Ht 5' 6.25" (1.683 m)  Wt 118 lb  6.4 oz (53.706 kg)  BMI 18.96 kg/m2  SpO2 97% Objective:   Physical Exam  Constitutional: She is oriented to person, place, and time. She appears well-developed and well-nourished. She does not appear ill. No distress.  HENT:  Head: Normocephalic and atraumatic.  Right Ear: Tympanic membrane, external ear and ear canal normal. Tympanic membrane is not injected and not retracted. No middle ear effusion.  Left Ear: Tympanic membrane, external ear and ear canal normal. Tympanic membrane is not injected and not retracted.  No middle ear effusion.  Nose: No mucosal edema or rhinorrhea. Right sinus exhibits no maxillary sinus tenderness and no frontal sinus tenderness. Left sinus exhibits no maxillary sinus tenderness and no frontal  sinus tenderness.  Mouth/Throat: Uvula is midline and mucous membranes are normal. Posterior oropharyngeal erythema present. No oropharyngeal exudate, posterior oropharyngeal edema or tonsillar abscesses.  Eyes: Conjunctivae are normal. Right eye exhibits no discharge. Left eye exhibits no discharge. No scleral icterus.  Neck: Normal range of motion. Neck supple.  Cardiovascular: Normal rate, regular rhythm, normal heart sounds and intact distal pulses.   Pulmonary/Chest: Effort normal and breath sounds normal.  Lymphadenopathy:       Head (right side): No submandibular, no preauricular and no posterior auricular adenopathy present.       Head (left side): No submandibular, no preauricular and no posterior auricular adenopathy present.    She has no cervical adenopathy.       Right: No supraclavicular adenopathy present.       Left: No supraclavicular adenopathy present.  Neurological: She is alert and oriented to person, place, and time.  Skin: Skin is warm and dry. She is not diaphoretic. No erythema.  Psychiatric: She has a normal mood and affect. Her behavior is normal.      Assessment & Plan:  Sinusitis, acute - Rec sudafed and mucinex. No signs of bacterial infection at this point in time but pt around immuno-suppressed relatives and traveling w/o ready access to medical care so gave snap rx for zpack w/ refill per her req and cough syrup to help sleep at night. Cont steam, netti pot, hot showers, etc.  Cons nasal steroid in future - may help with sensation of congestion w/o rhinitis.  Meds ordered this encounter  Medications  . azithromycin (ZITHROMAX) 250 MG tablet    Sig: Take 2 tabs PO x 1 dose, then 1 tab PO QD x 4 days    Dispense:  6 tablet    Refill:  1  . Phenyleph-Promethazine-Cod (PROMETHAZINE VC/CODEINE) 5-6.25-10 MG/5ML SYRP    Sig: Take 5 mLs by mouth every 6 (six) hours as needed.    Dispense:  180 mL    Refill:  0

## 2012-12-13 NOTE — Patient Instructions (Signed)
Hot showers or breathing in steam may help loosen the congestion.  Using a netti pot or sinus rinse is also likely to help you feel better and keep this from progressing. I recommend augmenting with 12 hr sudafed (behind the counter) and generic mucinex to help you move out the congestion.  If no improvement or you are getting worse, come back as you might need a course of steroids but hopefully with all of the above, you can avoid it.  Sinusitis Sinusitis is redness, soreness, and swelling (inflammation) of the paranasal sinuses. Paranasal sinuses are air pockets within the bones of your face (beneath the eyes, the middle of the forehead, or above the eyes). In healthy paranasal sinuses, mucus is able to drain out, and air is able to circulate through them by way of your nose. However, when your paranasal sinuses are inflamed, mucus and air can become trapped. This can allow bacteria and other germs to grow and cause infection. Sinusitis can develop quickly and last only a short time (acute) or continue over a long period (chronic). Sinusitis that lasts for more than 12 weeks is considered chronic.  CAUSES  Causes of sinusitis include:  Allergies.  Structural abnormalities, such as displacement of the cartilage that separates your nostrils (deviated septum), which can decrease the air flow through your nose and sinuses and affect sinus drainage.  Functional abnormalities, such as when the small hairs (cilia) that line your sinuses and help remove mucus do not work properly or are not present. SYMPTOMS  Symptoms of acute and chronic sinusitis are the same. The primary symptoms are pain and pressure around the affected sinuses. Other symptoms include:  Upper toothache.  Earache.  Headache.  Bad breath.  Decreased sense of smell and taste.  A cough, which worsens when you are lying flat.  Fatigue.  Fever.  Thick drainage from your nose, which often is green and may contain pus  (purulent).  Swelling and warmth over the affected sinuses. DIAGNOSIS  Your caregiver will perform a physical exam. During the exam, your caregiver may:  Look in your nose for signs of abnormal growths in your nostrils (nasal polyps).  Tap over the affected sinus to check for signs of infection.  View the inside of your sinuses (endoscopy) with a special imaging device with a light attached (endoscope), which is inserted into your sinuses. If your caregiver suspects that you have chronic sinusitis, one or more of the following tests may be recommended:  Allergy tests.  Nasal culture A sample of mucus is taken from your nose and sent to a lab and screened for bacteria.  Nasal cytology A sample of mucus is taken from your nose and examined by your caregiver to determine if your sinusitis is related to an allergy. TREATMENT  Most cases of acute sinusitis are related to a viral infection and will resolve on their own within 10 days. Sometimes medicines are prescribed to help relieve symptoms (pain medicine, decongestants, nasal steroid sprays, or saline sprays).  However, for sinusitis related to a bacterial infection, your caregiver will prescribe antibiotic medicines. These are medicines that will help kill the bacteria causing the infection.  Rarely, sinusitis is caused by a fungal infection. In theses cases, your caregiver will prescribe antifungal medicine. For some cases of chronic sinusitis, surgery is needed. Generally, these are cases in which sinusitis recurs more than 3 times per year, despite other treatments. HOME CARE INSTRUCTIONS   Drink plenty of water. Water helps thin the mucus  so your sinuses can drain more easily.  Use a humidifier.  Inhale steam 3 to 4 times a day (for example, sit in the bathroom with the shower running).  Apply a warm, moist washcloth to your face 3 to 4 times a day, or as directed by your caregiver.  Use saline nasal sprays to help moisten and  clean your sinuses.  Take over-the-counter or prescription medicines for pain, discomfort, or fever only as directed by your caregiver. SEEK IMMEDIATE MEDICAL CARE IF:  You have increasing pain or severe headaches.  You have nausea, vomiting, or drowsiness.  You have swelling around your face.  You have vision problems.  You have a stiff neck.  You have difficulty breathing. MAKE SURE YOU:   Understand these instructions.  Will watch your condition.  Will get help right away if you are not doing well or get worse. Document Released: 05/05/2005 Document Revised: 07/28/2011 Document Reviewed: 05/20/2011 [facility] Patient Information 2014 [place], [place].

## 2013-01-05 ENCOUNTER — Ambulatory Visit (INDEPENDENT_AMBULATORY_CARE_PROVIDER_SITE_OTHER): Payer: BC Managed Care – PPO | Admitting: Physician Assistant

## 2013-01-05 ENCOUNTER — Encounter: Payer: Self-pay | Admitting: Internal Medicine

## 2013-01-05 VITALS — BP 122/72 | HR 72 | Temp 98.7°F | Resp 16 | Ht 66.5 in | Wt 122.0 lb

## 2013-01-05 DIAGNOSIS — R300 Dysuria: Secondary | ICD-10-CM

## 2013-01-05 DIAGNOSIS — N390 Urinary tract infection, site not specified: Secondary | ICD-10-CM

## 2013-01-05 LAB — POCT URINALYSIS DIPSTICK
Nitrite, UA: NEGATIVE
Protein, UA: NEGATIVE
Spec Grav, UA: 1.015
Urobilinogen, UA: 0.2
pH, UA: 6

## 2013-01-05 LAB — POCT UA - MICROSCOPIC ONLY
Casts, Ur, LPF, POC: NEGATIVE
Crystals, Ur, HPF, POC: NEGATIVE
Epithelial cells, urine per micros: NEGATIVE

## 2013-01-05 MED ORDER — NITROFURANTOIN MONOHYD MACRO 100 MG PO CAPS: 100.0000 mg | ORAL_CAPSULE | Freq: Two times a day (BID) | ORAL | Status: DC

## 2013-01-05 MED ORDER — PHENAZOPYRIDINE HCL 200 MG PO TABS: 200.0000 mg | ORAL_TABLET | Freq: Three times a day (TID) | ORAL | Status: DC | PRN

## 2013-01-05 MED ORDER — FLUCONAZOLE 150 MG PO TABS: 150.0000 mg | ORAL_TABLET | Freq: Once | ORAL | Status: DC

## 2013-01-05 NOTE — Progress Notes (Signed)
  Subjective:    Patient ID: Rebecca Marks, female    DOB: 25-Jul-1955, 57 y.o.   MRN: [id number]  HPI   Rebecca Marks is a very pleasant 57 yr old female with 2 days of UTI symptoms.  Experiencing dysuria, frequency, urgency.  Symptoms are very uncomfortable.  Has been drinking plenty of cranberry juice and water but no improvement in symptoms.  Denies hematuria, abd pain, flank pain, NV, FC.  No vaginal symptoms.  Recently completed azithro x 2 and augmentin x 1 for sinusitis.  Subsequently developed yeast infection which has finally resolved.  Last UTI was years ago, but this feels similar to previous episodes.    Review of Systems  Constitutional: Negative for fever and chills.  HENT: Negative.   Respiratory: Negative.   Cardiovascular: Negative.   Gastrointestinal: Negative.   Genitourinary: Positive for dysuria, urgency and frequency. Negative for hematuria, flank pain and vaginal discharge.  Musculoskeletal: Negative.   Skin: Negative.   Neurological: Negative.        Objective:   Physical Exam  Vitals reviewed. Constitutional: She is oriented to person, place, and time. She appears well-developed and well-nourished. No distress.  HENT:  Head: Normocephalic and atraumatic.  Eyes: Conjunctivae are normal. No scleral icterus.  Cardiovascular: Normal rate, regular rhythm and normal heart sounds.   Pulmonary/Chest: Effort normal and breath sounds normal. She has no wheezes. She has no rales.  Abdominal: Soft. There is no tenderness.  Neurological: She is alert and oriented to person, place, and time.  Skin: Skin is warm and dry.  Psychiatric: She has a normal mood and affect. Her behavior is normal.    Results for orders placed in visit on 01/05/13  POCT URINALYSIS DIPSTICK      Result Value Range   Color, UA yellow     Clarity, UA cloudy     Glucose, UA eng     Bilirubin, UA eng     Ketones, UA eng     Spec Grav, UA 1.015     Blood, UA moderate     pH, UA 6.0     Protein, UA neg     Urobilinogen, UA 0.2     Nitrite, UA neg     Leukocytes, UA large (3+)    POCT UA - MICROSCOPIC ONLY      Result Value Range   WBC, Ur, HPF, POC tntc     RBC, urine, microscopic 0-1     Bacteria, U Microscopic 1+     Mucus, UA neg     Epithelial cells, urine per micros neg     Crystals, Ur, HPF, POC neg     Casts, Ur, LPF, POC neg     Yeast, UA neg           Assessment & Plan:  UTI (urinary tract infection) - Plan: Urine culture, nitrofurantoin, macrocrystal-monohydrate, (MACROBID) 100 MG capsule, phenazopyridine (PYRIDIUM) 200 MG tablet  Dysuria - Plan: POCT urinalysis dipstick, POCT UA - Microscopic Only   Rebecca Marks is a very pleasant 57 yr old female with UTI.  Will send cx and start macrobid.  Push fluids.  May use pyridium for the next 1-2 days for symptom relief.  If any symptoms worsening or not improving pt to call or RTC.  Will adjust therapy based on culture results if necessary.  I have also sent fluconazole to the pharmacy in case she develops another yeast infection after antibiotic use.

## 2013-01-05 NOTE — Patient Instructions (Addendum)
Begin taking the nitrofurantoin (Macrobid) tonight.  You can also use the pyridium every 8 hours if needed to help with symptoms.  Drink plenty of water.  I will let you know what your culture shows and if we need to make any changes based on that.  Please let me know if any symptoms are worsening or not improving.   Urinary Tract Infection Urinary tract infections (UTIs) can develop anywhere along your urinary tract. Your urinary tract is your body's drainage system for removing wastes and extra water. Your urinary tract includes two kidneys, two ureters, a bladder, and a urethra. Your kidneys are a pair of bean-shaped organs. Each kidney is about the size of your fist. They are located below your ribs, one on each side of your spine. CAUSES Infections are caused by microbes, which are microscopic organisms, including fungi, viruses, and bacteria. These organisms are so small that they can only be seen through a microscope. Bacteria are the microbes that most commonly cause UTIs. SYMPTOMS  Symptoms of UTIs may vary by age and gender of the patient and by the location of the infection. Symptoms in young women typically include a frequent and intense urge to urinate and a painful, burning feeling in the bladder or urethra during urination. Older women and men are more likely to be tired, shaky, and weak and have muscle aches and abdominal pain. A fever may mean the infection is in your kidneys. Other symptoms of a kidney infection include pain in your back or sides below the ribs, nausea, and vomiting. DIAGNOSIS To diagnose a UTI, your caregiver will ask you about your symptoms. Your caregiver also will ask to provide a urine sample. The urine sample will be tested for bacteria and white blood cells. White blood cells are made by your body to help fight infection. TREATMENT  Typically, UTIs can be treated with medication. Because most UTIs are caused by a bacterial infection, they usually can be treated  with the use of antibiotics. The choice of antibiotic and length of treatment depend on your symptoms and the type of bacteria causing your infection. HOME CARE INSTRUCTIONS  If you were prescribed antibiotics, take them exactly as your caregiver instructs you. Finish the medication even if you feel better after you have only taken some of the medication.  Drink enough water and fluids to keep your urine clear or pale yellow.  Avoid caffeine, tea, and carbonated beverages. They tend to irritate your bladder.  Empty your bladder often. Avoid holding urine for long periods of time.  Empty your bladder before and after sexual intercourse.  After a bowel movement, women should cleanse from front to back. Use each tissue only once. SEEK MEDICAL CARE IF:   You have back pain.  You develop a fever.  Your symptoms do not begin to resolve within 3 days. SEEK IMMEDIATE MEDICAL CARE IF:   You have severe back pain or lower abdominal pain.  You develop chills.  You have nausea or vomiting.  You have continued burning or discomfort with urination. MAKE SURE YOU:   Understand these instructions.  Will watch your condition.  Will get help right away if you are not doing well or get worse. Document Released: 02/12/2005 Document Revised: 11/04/2011 Document Reviewed: 06/13/2011 [facility] Patient Information 2014 [place], [place].

## 2013-01-09 LAB — URINE CULTURE: Colony Count: 70000

## 2013-03-02 LAB — HM PAP SMEAR

## 2013-03-23 ENCOUNTER — Other Ambulatory Visit: Payer: BC Managed Care – PPO

## 2013-03-24 ENCOUNTER — Other Ambulatory Visit: Payer: Self-pay

## 2013-08-02 ENCOUNTER — Encounter: Payer: Self-pay | Admitting: Internal Medicine

## 2013-08-02 ENCOUNTER — Ambulatory Visit (INDEPENDENT_AMBULATORY_CARE_PROVIDER_SITE_OTHER): Payer: BC Managed Care – PPO | Admitting: Internal Medicine

## 2013-08-02 ENCOUNTER — Other Ambulatory Visit (INDEPENDENT_AMBULATORY_CARE_PROVIDER_SITE_OTHER): Payer: BC Managed Care – PPO

## 2013-08-02 VITALS — BP 108/70 | HR 77 | Temp 98.7°F | Resp 12 | Wt 123.0 lb

## 2013-08-02 DIAGNOSIS — D582 Other hemoglobinopathies: Secondary | ICD-10-CM

## 2013-08-02 DIAGNOSIS — R5383 Other fatigue: Secondary | ICD-10-CM

## 2013-08-02 DIAGNOSIS — R5381 Other malaise: Secondary | ICD-10-CM

## 2013-08-02 DIAGNOSIS — I7300 Raynaud's syndrome without gangrene: Secondary | ICD-10-CM

## 2013-08-02 DIAGNOSIS — K5900 Constipation, unspecified: Secondary | ICD-10-CM

## 2013-08-02 DIAGNOSIS — M1990 Unspecified osteoarthritis, unspecified site: Secondary | ICD-10-CM

## 2013-08-02 DIAGNOSIS — Z8619 Personal history of other infectious and parasitic diseases: Secondary | ICD-10-CM

## 2013-08-02 DIAGNOSIS — Z23 Encounter for immunization: Secondary | ICD-10-CM

## 2013-08-02 LAB — CBC WITH DIFFERENTIAL/PLATELET
Basophils Absolute: 0 10*3/uL (ref 0.0–0.1)
Basophils Relative: 0.4 % (ref 0.0–3.0)
EOS PCT: 0.6 % (ref 0.0–5.0)
Eosinophils Absolute: 0 10*3/uL (ref 0.0–0.7)
HEMATOCRIT: 42 % (ref 36.0–46.0)
HEMOGLOBIN: 14.3 g/dL (ref 12.0–15.0)
LYMPHS ABS: 2.4 10*3/uL (ref 0.7–4.0)
LYMPHS PCT: 33.9 % (ref 12.0–46.0)
MCHC: 34 g/dL (ref 30.0–36.0)
MCV: 96.9 fl (ref 78.0–100.0)
MONOS PCT: 7.8 % (ref 3.0–12.0)
Monocytes Absolute: 0.6 10*3/uL (ref 0.1–1.0)
Neutro Abs: 4.1 10*3/uL (ref 1.4–7.7)
Neutrophils Relative %: 57.3 % (ref 43.0–77.0)
PLATELETS: 232 10*3/uL (ref 150.0–400.0)
RBC: 4.34 Mil/uL (ref 3.87–5.11)
RDW: 12.6 % (ref 11.5–14.6)
WBC: 7.2 10*3/uL (ref 4.5–10.5)

## 2013-08-02 LAB — IBC PANEL
IRON: 63 ug/dL (ref 42–145)
SATURATION RATIOS: 17.5 % — AB (ref 20.0–50.0)
Transferrin: 256.9 mg/dL (ref 212.0–360.0)

## 2013-08-02 LAB — TSH: TSH: 1.25 u[IU]/mL (ref 0.35–5.50)

## 2013-08-02 NOTE — Progress Notes (Signed)
Pre visit review using our clinic review tool, if applicable. No additional management support is needed unless otherwise documented below in the visit note. 

## 2013-08-02 NOTE — Patient Instructions (Addendum)
Your next office appointment will be determined based upon review of your pending labs  . Those instructions will be transmitted to you through My Chart . Miralax every 3 rd day as needed for constipation. Share results with all non [facility] medical staff seen

## 2013-08-02 NOTE — Progress Notes (Signed)
   Subjective:    Patient ID: Rebecca Marks, female    DOB: [birth year], 57 y.o.   MRN: [id number]  HPI   She is concerned about a hemoglobin of 15.1 on labs collected 01/31/13. She is not taking any iron. She states she's concerned about the possibility of hemochromatosis.  She has a past history of hepatitis C.  She smoked for almost 30 years up to one pack per day. At this time she rarely smokes, less than one every 2 weeks.  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    Review of Systems  The labs were done by Dr. Estanislado Pandy to evaluate pain and swelling in the index and third finger of the right hand at the DIP joints. Osteoarthritis was diagnosed. Her sedimentation rate was insignificant @ 1. She's been using complementary/supplemental therapies with good response. Additionally her uric acid was extremely good at 4.0. She has fatigue & constipation.  The fatigue it occurs at the end of a busy day. She has no fatigue, chest pain, or dyspnea with significant physical activity.  Present had extensive cardiac for surgery in January of this year and she has been his primary caregiver.  She believes her hepatitis C has resolved completely; she does request an updated evaluation .      Objective:   Physical Exam  General appearance:thin but in good health and nourishment w/o distress.  Eyes: No conjunctival inflammation or scleral icterus is present.  Oral exam: Dental hygiene is good; lips and gums are healthy appearing.There is no oropharyngeal erythema or exudate noted.   Thyroid is normal to palpation without enlargement, nodularity, or induration. Heart:  Normal rate and regular rhythm. S1 and S2 normal without gallop, murmur, click, rub or other extra sounds     Lungs:Chest clear to auscultation; no wheezes, rhonchi,rales ,or rubs present.No increased work of breathing.   Abdomen: bowel sounds normal, soft and non-tender without masses, organomegaly or hernias noted.  No  guarding or rebound .  Musculoskeletal: Able to lie flat and sit up without help. Gait normal  Skin:Warm & dry.  Intact without suspicious lesions or rashes ; no jaundice or tenting  Lymphatic: No lymphadenopathy is noted about the head, neck, axillary areas.    Deep tendon reflexes are equal and normal . Limb strength and tone are normal              Assessment & Plan:  #1 elevated Hgb #2 fatigue, non exertional #3constipation  #4 past history of hepatitis C; she's requesting a quanitative analysis of her status  See orders

## 2013-08-03 LAB — HEPATITIS C RNA QUANTITATIVE: HCV QUANT: NOT DETECTED [IU]/mL (ref <15)

## 2013-08-17 ENCOUNTER — Telehealth: Payer: Self-pay | Admitting: Internal Medicine

## 2013-08-17 NOTE — Telephone Encounter (Signed)
Patient Information:  Caller Name: Tilia  Phone: [phone]  Patient: Rebecca Marks, Rebecca Marks  Gender: Female  DOB: 1955/11/01  Age: 58 Years  PCP: Unice Cobble  Office Follow Up:  Does the office need to follow up with this patient?: No  Instructions For The Office: N/A  RN Note:  Onset of intermittent quick chest pain 08/15/13.  States she feels well but is fatigued.  Pain does not last longer than 5 minutes; they are described as "quick, stabbing pains lasting a couple of seconds, then gone."  Pain noted on left side of chest.  Afebrile. Mild cough which is occasional.  Per chest pain protocol, emergent symptoms denied; appt scheduled 08/18/13 1315 with Dr. Linna Darner per patient request.  Callback parameters given.  krs/can  Symptoms  Reason For Call & Symptoms: chest pain  Reviewed Health History In EMR: Yes  Reviewed Medications In EMR: Yes  Reviewed Allergies In EMR: Yes  Reviewed Surgeries / Procedures: Yes  Date of Onset of Symptoms: 08/15/2013  Guideline(s) Used:  Chest Pain  Disposition Per Guideline:   Home Care  Reason For Disposition Reached:   Intermittent mild chest pain lasting a few seconds each time  Advice Given:  Fleeting Chest Pain:  Fleeting chest pains that last only a few seconds and then go away are generally not serious. They may be from pinched muscles or nerves in your chest wall.  Expected Course:  These mild chest pains usually disappear within 3 days.  Patient Will Follow Care Advice:  YES

## 2013-08-18 ENCOUNTER — Ambulatory Visit (INDEPENDENT_AMBULATORY_CARE_PROVIDER_SITE_OTHER): Payer: BC Managed Care – PPO | Admitting: Internal Medicine

## 2013-08-18 ENCOUNTER — Ambulatory Visit (INDEPENDENT_AMBULATORY_CARE_PROVIDER_SITE_OTHER)
Admission: RE | Admit: 2013-08-18 | Discharge: 2013-08-18 | Disposition: A | Discharge status: Home / Self Care | Payer: BC Managed Care – PPO | Source: Ambulatory Visit | Attending: Internal Medicine | Admitting: Internal Medicine

## 2013-08-18 ENCOUNTER — Other Ambulatory Visit: Payer: BC Managed Care – PPO

## 2013-08-18 ENCOUNTER — Encounter: Payer: Self-pay | Admitting: Internal Medicine

## 2013-08-18 VITALS — BP 96/50 | HR 64 | Temp 98.0°F | Wt 122.0 lb

## 2013-08-18 DIAGNOSIS — R079 Chest pain, unspecified: Secondary | ICD-10-CM

## 2013-08-18 DIAGNOSIS — Z86718 Personal history of other venous thrombosis and embolism: Secondary | ICD-10-CM

## 2013-08-18 NOTE — Progress Notes (Signed)
Pre visit review using our clinic review tool, if applicable. No additional management support is needed unless otherwise documented below in the visit note. 

## 2013-08-18 NOTE — Progress Notes (Signed)
   Subjective:    Patient ID: Rebecca Marks, female    DOB: 1955/12/02, 58 y.o.   MRN: [id number]  HPI   She's been having recurrent left-sided chest pain since 08/15/13. This is described as sharp and stabbing lasting 1-2 seconds. The pain in the substernal or at the left anterior/lateral chest. There is no associated nausea and diaphoresis. It is not exertional & has no exacerbating or alleviating factors. It is not initiated by even heavy physical exercise  She does have a remote history of deep venous stenosis after traveling for long distance. She is not smoking at that time and was not on birth control pills.  Father had MI > 89 ; mother & PGF had CVAs , not pre mature Review of Systems No associated fever, chills, sweats, or weight loss are described. Palpitations occasionally @ night. , edema, calf pain, or claudication are denied. Cough paroxysmally w/o sputum.No dyspnea or hemoptysis .There is no associated back pain with anterior radiation of discomfort. No associated weakness, numbness/tingling in arms. Absent are dyspepsia, significant reflux or dysphagia. No changes in skin temperature or color in the area of the symptoms. Rash or skin lesions absent. No history of abnormal bruising or bleeding.      Objective:   Physical Exam Appearance: thin but healthy and well-nourished & in no acute distress  No carotid bruits are present.No neck pain distention present at 10 - 15 degrees. Thyroid normal to palpation  Heart rhythm and rate are normal with no gallop or murmur  Chest is clear with no increased work of breathing  There is no evidence of aortic aneurysm or renal artery bruits  Abdomen soft with no organomegaly or masses. No HJR  No clubbing, cyanosis or edema present.  Pedal pulses are intact .Homan's negative  No ischemic skin changes are present . Fingernails healthy   Alert and oriented. Strength, tone, DTRs reflexes normal          Assessment &  Plan:  #1 atypical chest pain #2 PMH of DVT See orders

## 2013-08-18 NOTE — Patient Instructions (Signed)
Your next office appointment will be determined based upon review of your pending labs & x-rays. Those instructions will be transmitted to you through My Chart . Do not take the Xarelto 15 mg twice a day and less than notify you to do so based on the pending labs.

## 2013-08-19 ENCOUNTER — Other Ambulatory Visit: Payer: Self-pay | Admitting: Internal Medicine

## 2013-08-19 DIAGNOSIS — R9389 Abnormal findings on diagnostic imaging of other specified body structures: Secondary | ICD-10-CM

## 2013-08-19 LAB — D-DIMER, QUANTITATIVE (NOT AT ARMC): D DIMER QUANT: 0.27 ug{FEU}/mL (ref 0.00–0.48)

## 2013-08-19 LAB — TROPONIN I: Troponin I: <0.01 ng/mL (ref <0.06)

## 2013-08-24 ENCOUNTER — Other Ambulatory Visit: Payer: BC Managed Care – PPO

## 2013-08-31 ENCOUNTER — Ambulatory Visit (INDEPENDENT_AMBULATORY_CARE_PROVIDER_SITE_OTHER)
Admission: RE | Admit: 2013-08-31 | Discharge: 2013-08-31 | Disposition: A | Discharge status: Home / Self Care | Payer: BC Managed Care – PPO | Source: Ambulatory Visit | Attending: Internal Medicine | Admitting: Internal Medicine

## 2013-08-31 DIAGNOSIS — R918 Other nonspecific abnormal finding of lung field: Secondary | ICD-10-CM

## 2013-08-31 DIAGNOSIS — R9389 Abnormal findings on diagnostic imaging of other specified body structures: Secondary | ICD-10-CM

## 2013-09-01 ENCOUNTER — Encounter: Payer: Self-pay | Admitting: Nurse Practitioner

## 2013-09-01 ENCOUNTER — Ambulatory Visit (INDEPENDENT_AMBULATORY_CARE_PROVIDER_SITE_OTHER): Payer: BC Managed Care – PPO | Admitting: Nurse Practitioner

## 2013-09-01 VITALS — BP 94/60 | HR 76 | Temp 98.2°F | Ht 66.5 in | Wt 124.0 lb

## 2013-09-01 DIAGNOSIS — R05 Cough: Secondary | ICD-10-CM

## 2013-09-01 DIAGNOSIS — J309 Allergic rhinitis, unspecified: Secondary | ICD-10-CM

## 2013-09-01 DIAGNOSIS — R059 Cough, unspecified: Secondary | ICD-10-CM

## 2013-09-01 MED ORDER — HYDROCODONE-HOMATROPINE 5-1.5 MG/5ML PO SYRP: 5.0000 mL | ORAL_SOLUTION | Freq: Every evening | ORAL | Status: DC | PRN

## 2013-09-01 NOTE — Patient Instructions (Signed)
Start rinsing sinuses daily throughout rest of allergy season. I think post nasal drip is contributing to cough. Chest CT was normal. Please let us know if you are not improving, develop fever, or feel worse.   Allergic Rhinitis Allergic rhinitis is when the mucous membranes in the nose respond to allergens. Allergens are particles in the air that cause your body to have an allergic reaction. This causes you to release allergic antibodies. Through a chain of events, these eventually cause you to release histamine into the blood stream. Although meant to protect the body, it is this release of histamine that causes your discomfort, such as frequent sneezing, congestion, and an itchy, runny nose.  CAUSES  Seasonal allergic rhinitis (hay fever) is caused by pollen allergens that may come from grasses, trees, and weeds. Year-round allergic rhinitis (perennial allergic rhinitis) is caused by allergens such as house dust mites, pet dander, and mold spores.  SYMPTOMS   Nasal stuffiness (congestion).  Itchy, runny nose with sneezing and tearing of the eyes. DIAGNOSIS  Your health care provider can help you determine the allergen or allergens that trigger your symptoms. If you and your health care provider are unable to determine the allergen, skin or blood testing may be used. TREATMENT  Allergic Rhinitis does not have a cure, but it can be controlled by:  Medicines and allergy shots (immunotherapy).  Avoiding the allergen. Hay fever may often be treated with antihistamines in pill or nasal spray forms. Antihistamines block the effects of histamine. There are over-the-counter medicines that may help with nasal congestion and swelling around the eyes. Check with your health care provider before taking or giving this medicine.  If avoiding the allergen or the medicine prescribed do not work, there are many new medicines your health care provider can prescribe. Stronger medicine may be used if initial  measures are ineffective. Desensitizing injections can be used if medicine and avoidance does not work. Desensitization is when a patient is given ongoing shots until the body becomes less sensitive to the allergen. Make sure you follow up with your health care provider if problems continue. HOME CARE INSTRUCTIONS It is not possible to completely avoid allergens, but you can reduce your symptoms by taking steps to limit your exposure to them. It helps to know exactly what you are allergic to so that you can avoid your specific triggers. SEEK MEDICAL CARE IF:   You have a fever.  You develop a cough that does not stop easily (persistent).  You have shortness of breath.  You start wheezing.  Symptoms interfere with normal daily activities. Document Released: 01/28/2001 Document Revised: 02/23/2013 Document Reviewed: 01/10/2013 [facility] Patient Information 2014 [place].

## 2013-09-01 NOTE — Progress Notes (Signed)
   Subjective:    Patient ID: Rebecca Marks, female    DOB: February 07, 1956, 58 y.o.   MRN: [id number]  Cough This is a new problem. The current episode started yesterday. The problem has been unchanged. The problem occurs every few minutes. The cough is non-productive. Associated symptoms include nasal congestion, postnasal drip and a sore throat. Pertinent negatives include no chest pain, chills, ear congestion, ear pain, fever, headaches, shortness of breath or wheezing. The symptoms are aggravated by dust (used blower for several hours 3 da). She has tried nothing for the symptoms. Her past medical history is significant for environmental allergies.      Review of Systems  Constitutional: Negative for fever, chills, activity change, appetite change and fatigue.  HENT: Positive for congestion (nasal), postnasal drip and sore throat. Negative for ear pain.   Respiratory: Positive for cough. Negative for chest tightness, shortness of breath and wheezing.   Cardiovascular: Negative for chest pain, palpitations and leg swelling.  Gastrointestinal: Negative for nausea, abdominal pain and diarrhea.  Musculoskeletal: Negative for back pain.  Allergic/Immunologic: Positive for environmental allergies.  Neurological: Negative for headaches.       Objective:   Physical Exam  Vitals reviewed. Constitutional: She is oriented to person, place, and time. She appears well-developed and well-nourished. No distress.  HENT:  Head: Normocephalic and atraumatic.  Right Ear: External ear normal.  Left Ear: External ear normal.  Mouth/Throat: No oropharyngeal exudate.  Post nasal drip  Eyes: Conjunctivae are normal. Right eye exhibits no discharge. Left eye exhibits no discharge.  Neck: Normal range of motion. Neck supple. No thyromegaly present.  Cardiovascular: Normal rate, regular rhythm and normal heart sounds.   No murmur heard. Pulmonary/Chest: Effort normal and breath sounds normal. No  respiratory distress. She has no wheezes. She has no rales.  Lymphadenopathy:    She has no cervical adenopathy.  Neurological: She is alert and oriented to person, place, and time.  Skin: Skin is warm and dry.  Psychiatric: She has a normal mood and affect. Her behavior is normal. Thought content normal.          Assessment & Plan:  1. Cough Likely r/t post-nasal drip.  Recent w/u for chest pain:  Xr showed pulm nodule R apex, not seen on Chest CT which was o/w  nml. D-dimer low.  - HYDROcodone-homatropine (HYCODAN) 5-1.5 MG/5ML syrup; Take 5 mLs by mouth at bedtime as needed for cough.  Dispense: 120 mL; Refill: 0 2 allergic rhinitis Daily sinus rinses. F/u 2 weeks.

## 2013-09-01 NOTE — Progress Notes (Signed)
Pre visit review using our clinic review tool, if applicable. No additional management support is needed unless otherwise documented below in the visit note. 

## 2013-09-02 ENCOUNTER — Inpatient Hospital Stay (HOSPITAL_COMMUNITY)
Admission: EM | Admit: 2013-09-02 | Discharge: 2013-09-07 | DRG: 871 | Disposition: A | Discharge status: Home / Self Care | Payer: BC Managed Care – PPO | Attending: Internal Medicine | Admitting: Internal Medicine

## 2013-09-02 ENCOUNTER — Encounter (HOSPITAL_COMMUNITY): Payer: Self-pay | Admitting: Emergency Medicine

## 2013-09-02 ENCOUNTER — Emergency Department (HOSPITAL_COMMUNITY): Payer: BC Managed Care – PPO

## 2013-09-02 DIAGNOSIS — Z8249 Family history of ischemic heart disease and other diseases of the circulatory system: Secondary | ICD-10-CM

## 2013-09-02 DIAGNOSIS — F17200 Nicotine dependence, unspecified, uncomplicated: Secondary | ICD-10-CM | POA: Diagnosis present

## 2013-09-02 DIAGNOSIS — B182 Chronic viral hepatitis C: Secondary | ICD-10-CM | POA: Diagnosis present

## 2013-09-02 DIAGNOSIS — B1920 Unspecified viral hepatitis C without hepatic coma: Secondary | ICD-10-CM | POA: Diagnosis present

## 2013-09-02 DIAGNOSIS — D649 Anemia, unspecified: Secondary | ICD-10-CM | POA: Diagnosis present

## 2013-09-02 DIAGNOSIS — Z807 Family history of other malignant neoplasms of lymphoid, hematopoietic and related tissues: Secondary | ICD-10-CM

## 2013-09-02 DIAGNOSIS — D72829 Elevated white blood cell count, unspecified: Secondary | ICD-10-CM

## 2013-09-02 DIAGNOSIS — M1990 Unspecified osteoarthritis, unspecified site: Secondary | ICD-10-CM | POA: Diagnosis present

## 2013-09-02 DIAGNOSIS — J189 Pneumonia, unspecified organism: Secondary | ICD-10-CM | POA: Diagnosis present

## 2013-09-02 DIAGNOSIS — E876 Hypokalemia: Secondary | ICD-10-CM | POA: Diagnosis present

## 2013-09-02 DIAGNOSIS — Z86718 Personal history of other venous thrombosis and embolism: Secondary | ICD-10-CM

## 2013-09-02 DIAGNOSIS — Z823 Family history of stroke: Secondary | ICD-10-CM

## 2013-09-02 DIAGNOSIS — Z79899 Other long term (current) drug therapy: Secondary | ICD-10-CM

## 2013-09-02 DIAGNOSIS — E860 Dehydration: Secondary | ICD-10-CM | POA: Diagnosis present

## 2013-09-02 DIAGNOSIS — J9600 Acute respiratory failure, unspecified whether with hypoxia or hypercapnia: Secondary | ICD-10-CM | POA: Diagnosis present

## 2013-09-02 DIAGNOSIS — E8779 Other fluid overload: Secondary | ICD-10-CM | POA: Diagnosis not present

## 2013-09-02 DIAGNOSIS — R5382 Chronic fatigue, unspecified: Secondary | ICD-10-CM

## 2013-09-02 DIAGNOSIS — A419 Sepsis, unspecified organism: Principal | ICD-10-CM | POA: Diagnosis present

## 2013-09-02 DIAGNOSIS — Z8619 Personal history of other infectious and parasitic diseases: Secondary | ICD-10-CM

## 2013-09-02 DIAGNOSIS — Z833 Family history of diabetes mellitus: Secondary | ICD-10-CM

## 2013-09-02 DIAGNOSIS — R5383 Other fatigue: Secondary | ICD-10-CM | POA: Diagnosis present

## 2013-09-02 DIAGNOSIS — R6510 Systemic inflammatory response syndrome (SIRS) of non-infectious origin without acute organ dysfunction: Secondary | ICD-10-CM

## 2013-09-02 DIAGNOSIS — I7300 Raynaud's syndrome without gangrene: Secondary | ICD-10-CM | POA: Diagnosis present

## 2013-09-02 DIAGNOSIS — Z803 Family history of malignant neoplasm of breast: Secondary | ICD-10-CM

## 2013-09-02 HISTORY — DX: Allergy, unspecified, initial encounter: T78.40XA

## 2013-09-02 HISTORY — DX: Unspecified osteoarthritis, unspecified site: M19.90

## 2013-09-02 HISTORY — DX: Other intervertebral disc displacement, lumbar region: M51.26

## 2013-09-02 LAB — CBC WITH DIFFERENTIAL/PLATELET
Basophils Absolute: 0 10*3/uL (ref 0.0–0.1)
Basophils Relative: 0 % (ref 0–1)
EOS PCT: 0 % (ref 0–5)
Eosinophils Absolute: 0 10*3/uL (ref 0.0–0.7)
HCT: 34.4 % — ABNORMAL LOW (ref 36.0–46.0)
Hemoglobin: 12.6 g/dL (ref 12.0–15.0)
LYMPHS ABS: 1.3 10*3/uL (ref 0.7–4.0)
LYMPHS PCT: 9 % — AB (ref 12–46)
MCH: 33.2 pg (ref 26.0–34.0)
MCHC: 36.6 g/dL — ABNORMAL HIGH (ref 30.0–36.0)
MCV: 90.8 fL (ref 78.0–100.0)
Monocytes Absolute: 1.3 10*3/uL — ABNORMAL HIGH (ref 0.1–1.0)
Monocytes Relative: 10 % (ref 3–12)
NEUTROS PCT: 81 % — AB (ref 43–77)
Neutro Abs: 10.8 10*3/uL — ABNORMAL HIGH (ref 1.7–7.7)
PLATELETS: 153 10*3/uL (ref 150–400)
RBC: 3.79 MIL/uL — ABNORMAL LOW (ref 3.87–5.11)
RDW: 12.1 % (ref 11.5–15.5)
WBC: 13.4 10*3/uL — ABNORMAL HIGH (ref 4.0–10.5)

## 2013-09-02 LAB — COMPREHENSIVE METABOLIC PANEL
ALK PHOS: 48 U/L (ref 39–117)
ALT: 7 U/L (ref 0–35)
AST: 27 U/L (ref 0–37)
Albumin: 3.9 g/dL (ref 3.5–5.2)
BILIRUBIN TOTAL: 0.6 mg/dL (ref 0.3–1.2)
BUN: 16 mg/dL (ref 6–23)
CO2: 22 meq/L (ref 19–32)
Calcium: 8.5 mg/dL (ref 8.4–10.5)
Chloride: 100 mEq/L (ref 96–112)
Creatinine, Ser: 0.76 mg/dL (ref 0.50–1.10)
GLUCOSE: 102 mg/dL — AB (ref 70–99)
POTASSIUM: 3.6 meq/L — AB (ref 3.7–5.3)
Sodium: 137 mEq/L (ref 137–147)
Total Protein: 6.6 g/dL (ref 6.0–8.3)

## 2013-09-02 LAB — URINALYSIS, ROUTINE W REFLEX MICROSCOPIC
BILIRUBIN URINE: NEGATIVE
Glucose, UA: NEGATIVE mg/dL
Ketones, ur: NEGATIVE mg/dL
Leukocytes, UA: NEGATIVE
Nitrite: NEGATIVE
PROTEIN: NEGATIVE mg/dL
Specific Gravity, Urine: 1.014 (ref 1.005–1.030)
Urobilinogen, UA: 0.2 mg/dL (ref 0.0–1.0)
pH: 5 (ref 5.0–8.0)

## 2013-09-02 LAB — URINE MICROSCOPIC-ADD ON

## 2013-09-02 LAB — I-STAT CG4 LACTIC ACID, ED: Lactic Acid, Venous: 0.96 mmol/L (ref 0.5–2.2)

## 2013-09-02 MED ORDER — SODIUM CHLORIDE 0.9 % IV SOLN
1000.0000 mL | INTRAVENOUS | Status: DC
  Administered 2013-09-03 (×3): 1000 mL via INTRAVENOUS

## 2013-09-02 MED ORDER — DEXTROSE 5 % IV SOLN
500.0000 mg | Freq: Once | INTRAVENOUS | Status: AC
  Administered 2013-09-02: 500 mg via INTRAVENOUS

## 2013-09-02 MED ORDER — ACETAMINOPHEN 325 MG PO TABS
650.0000 mg | ORAL_TABLET | Freq: Once | ORAL | Status: AC
  Administered 2013-09-02: 650 mg via ORAL
  Filled 2013-09-02: qty 2

## 2013-09-02 MED ORDER — SODIUM CHLORIDE 0.9 % IV SOLN
1000.0000 mL | Freq: Once | INTRAVENOUS | Status: AC
  Administered 2013-09-02: 1000 mL via INTRAVENOUS

## 2013-09-02 MED ORDER — DEXTROSE 5 % IV SOLN
1.0000 g | INTRAVENOUS | Status: DC
  Administered 2013-09-02 – 2013-09-05 (×4): 1 g via INTRAVENOUS
  Filled 2013-09-02 (×4): qty 10

## 2013-09-02 MED ORDER — CEFTRIAXONE SODIUM 1 G IJ SOLR
1.0000 g | Freq: Once | INTRAMUSCULAR | Status: DC
  Filled 2013-09-02: qty 10

## 2013-09-02 NOTE — H&P (Addendum)
[facility] History and Physical  Rebecca Marks ONG:[id number] DOB: 1955-08-21 DOA: 09/02/2013  Referring physician: Pattricia Boss PCP: Unice Cobble, MD    Chief Complaint: fever, weakness  HPI: Rebecca Marks is a 58 y.o. female with PMH of Hep C and arthritis who presents with fever, dehydration and severe weakness. She developed chest pain last wk in her left chest described as short and sharp. She had a work up done including cardiac enzymes and a CXR. CXR revealed a nodule and therefore a CT was done which was negative for a PE and for the nodule. She continued to have the pain and felt something was wrong. She noted that she was coughing as well and received a prescription for cough syrup. She became worse with fevers and sputum production. She is nauseated and feels very weak.    General: The patient denies anorexia, weight loss, Cardiac: +chest pain, no syncope, occassional palpitations, pedal edema  Respiratory: + dyspnea on exertion, cough, no wheezing  GI: Denies severe indigestion/heartburn, abdominal pain, diarrhea + nausea, vomiting tonight GU: Denies hematuria, incontinence, dysuria  Musculoskeletal: chronic back pain and arthritis Skin: Denies suspicious skin lesions Neurologic: Denies focal weakness or numbness, change in vision  Past Medical History  Diagnosis Date  . Hepatitis C     contaminated blood @ her job  . Ruptured lumbar disc   . Arthritis     R wrist and R knee   Past Surgical History  Procedure Laterality Date  . Cholescystectomy    . G 0 p 0    . Herniated disc    . Cholecystectomy     Social History:  Quit smoking about a couple of wks ago. Would smoke 2-3 cigarretes a day. She has never used smokeless tobacco. She reports that she does not drink alcohol or use illicit drugs.   Allergies  Allergen Reactions  . Dextromethorphan     "drunk"; ataxic    Family History  Problem Relation Age of Onset  . Diabetes Father   .  Hypertension Father   . Heart attack Father     >55  . Lymphoma Father   . Stroke Mother 50    after bedridden post fall with BLE paralysis  . Breast cancer Mother      X 3  . Cancer Mother     breast  . Cancer Sister     breast  . Alzheimer's disease Maternal Grandmother   . Stroke Maternal Grandfather     in 75s     Prior to Admission medications   Medication Sig Start Date End Date Taking? Authorizing Provider  HYDROcodone-homatropine (HYCODAN) 5-1.5 MG/5ML syrup Take 5 mLs by mouth at bedtime as needed for cough. 09/01/13   Irene Pap, NP  ibuprofen (ADVIL,MOTRIN) 800 MG tablet Take 800 mg by mouth at bedtime.    Historical Provider, MD  Misc Natural Products (TART CHERRY ADVANCED) CAPS Take 100 capsules by mouth daily.    Historical Provider, MD  OVER THE COUNTER MEDICATION Take 1 capsule by mouth daily. Ginger 300mg /Turmeric 300mg     Historical Provider, MD  PHOSPHATIDYLSERINE PO Take 200 mg by mouth daily.     Historical Provider, MD  UNABLE TO FIND Take 1,000 mg by mouth daily. Ionic Calcium    Historical Provider, MD  VOLTAREN 1 % GEL Apply 1 application topically at bedtime. 07/31/13   Historical Provider, MD  zolpidem (AMBIEN) 10 MG tablet Take 5 mg by mouth at bedtime as needed.  07/19/13  Historical Provider, MD     Physical Exam: Filed Vitals:   09/02/13 2145 09/02/13 2246 09/02/13 2313  BP: 106/68 106/61 103/59  Pulse: 98 96 92  Temp: 103 F (39.4 C)  99.4 F (37.4 C)  TempSrc: Oral  Oral  Resp: 16 19 16   SpO2: 98% 97% 93%     General: AAO x 3, no distress HEENT: Normocephalic and Atraumatic, Mucous membranes pink                PERRLA; EOM intact; No scleral icterus,                 Nares: Patent, Oropharynx: Clear, Fair Dentition                 Neck: FROM, no cervical lymphadenopathy, thyromegaly, carotid bruit or JVD;  Breasts: deferred CHEST WALL: No tenderness  CHEST: Normal respiration, clear to auscultation bilaterally  HEART: Regular rate  and rhythm; no murmurs rubs or gallops  BACK: No kyphosis or scoliosis; no CVA tenderness  ABDOMEN: Positive Bowel Sounds, soft, non-tender; no masses, no organomegaly Rectal Exam: deferred EXTREMITIES: No cyanosis, clubbing, or edema- mild swelling of distal phalangeal joints on hands Genitalia: not examined  SKIN:  no rash or ulceration  CNS: Alert and Oriented x 4, Nonfocal exam, CN 2-12 intact  Labs on Admission:  Basic Metabolic Panel:  Recent Labs Lab 09/02/13 2237  NA 137  K 3.6*  CL 100  CO2 22  GLUCOSE 102*  BUN 16  CREATININE 0.76  CALCIUM 8.5   Liver Function Tests:  Recent Labs Lab 09/02/13 2237  AST 27  ALT 7  ALKPHOS 48  BILITOT 0.6  PROT 6.6  ALBUMIN 3.9   No results found for this basename: LIPASE, AMYLASE,  in the last 168 hours No results found for this basename: AMMONIA,  in the last 168 hours CBC:  Recent Labs Lab 09/02/13 2237  WBC 13.4*  NEUTROABS 10.8*  HGB 12.6  HCT 34.4*  MCV 90.8  PLT 153   Cardiac Enzymes: No results found for this basename: CKTOTAL, CKMB, CKMBINDEX, TROPONINI,  in the last 168 hours  BNP (last 3 results) No results found for this basename: PROBNP,  in the last 8760 hours CBG: No results found for this basename: GLUCAP,  in the last 168 hours  Radiological Exams on Admission: Dg Chest Port 1 View  09/02/2013   CLINICAL DATA:  58 year old female with hypertension and emesis. Initial encounter.  EXAM: PORTABLE CHEST - 1 VIEW  COMPARISON:  Chest CT 08/31/2013 and earlier.  FINDINGS: Portable AP semi upright view at 2239 hrs. The patient is mildly rotated to the right. Stable cardiac size and mediastinal contours. No pneumothorax or pneumoperitoneum. EKG leads and wires overlie the chest. No pulmonary edema, pleural effusion or consolidation.  IMPRESSION: No acute cardiopulmonary abnormality.   Electronically Signed   By: Lars Pinks M.D.   On: 09/02/2013 22:50    EKG: Independently reviewed. Normal sinus rhythm with  right axis deviation  Assessment/Plan Principal Problem:   Sepsis- fever of 103, leukocytosis, tachycardia, hypotension - suspect PNA vs bronchitis vs influenza - cover with rocephin and zithromax - f/u blood cultures - check flu swab as well- Although this season is changing for this, I recently had a case of Influenza B a wk ago.  - aggressive hydration (has received 2 L in ER)- BP in low 90s and good urine output  Active Problems:   HEPATITIS C, HX OF -stable  Hypokalemia - replace with oral K    Consulted: none  Code Status: Full code  Family Communication: none  Disposition Plan: likely home in 2-3 days   Time spent: > 35 min  Debbe Odea, MD [facility]  If 7PM-7AM, please contact night-coverage [email or web address] 09/03/2013, 12:20 AM

## 2013-09-02 NOTE — ED Notes (Signed)
Bed: MG86 Expected date:  Expected time:  Means of arrival:  Comments: EMS 16F N/V hypotensive 90/68 from PCP

## 2013-09-02 NOTE — ED Provider Notes (Signed)
CSN: [id number]     Arrival date & time 09/02/13  2136 History   First MD Initiated Contact with Patient 09/02/13 2151     Chief Complaint  Patient presents with  . Hypotension  . Emesis     (Consider location/radiation/quality/duration/timing/severity/associated sxs/prior Treatment) HPI 58 year old female presents today with fever and weakness. She began having cough in the past 24 hours. It is productive. She has felt generally weak. She has been seen during the past 2 weeks for some chest pain reports that she had a normal CT of her chest she is a smoker but has been decreasing her smoking and is now down to one to 2 cigarettes per day. She has had some decreased by mouth intake do to decreased appetite with her illness but has not vomited and has been tried to take fluids. She denies any headache, sore throat, vomiting, abdominal pain, or diarrhea. Her husband took her to the [facility] and they were instructed that her blood pressure was low and to come here. They report that this is what her blood pressure is usually runs.  Past Medical History  Diagnosis Date  . Hepatitis C     contaminated blood @ her job  . Ruptured lumbar disc   . Arthritis     R wrist and R knee   Past Surgical History  Procedure Laterality Date  . Cholescystectomy    . G 0 p 0    . Herniated disc    . Cholecystectomy     Family History  Problem Relation Age of Onset  . Diabetes Father   . Hypertension Father   . Heart attack Father     >55  . Lymphoma Father   . Stroke Mother 62    after bedridden post fall with BLE paralysis  . Breast cancer Mother      X 3  . Cancer Mother     breast  . Cancer Sister     breast  . Alzheimer's disease Maternal Grandmother   . Stroke Maternal Grandfather     in 73s   History  Substance Use Topics  . Smoking status: Former Smoker -- 0.25 packs/day  . Smokeless tobacco: Never Used     Comment: smoked 1979-2008, up to 1 ppd. As of 08/01/13 1 cig  every 2 weeks  . Alcohol Use: No   OB History   Grav Para Term Preterm Abortions TAB SAB Ect Mult Living                 Review of Systems  All other systems reviewed and are negative.     Allergies  Dextromethorphan  Home Medications   Prior to Admission medications   Medication Sig Start Date End Date Taking? Authorizing Provider  HYDROcodone-homatropine (HYCODAN) 5-1.5 MG/5ML syrup Take 5 mLs by mouth at bedtime as needed for cough. 09/01/13   Irene Pap, NP  ibuprofen (ADVIL,MOTRIN) 800 MG tablet Take 800 mg by mouth at bedtime.    Historical Provider, MD  Misc Natural Products (TART CHERRY ADVANCED) CAPS Take 100 capsules by mouth daily.    Historical Provider, MD  OVER THE COUNTER MEDICATION Take 1 capsule by mouth daily. Ginger 300mg /Turmeric 300mg     Historical Provider, MD  PHOSPHATIDYLSERINE PO Take 200 mg by mouth daily.     Historical Provider, MD  UNABLE TO FIND Take 1,000 mg by mouth daily. Ionic Calcium    Historical Provider, MD  VOLTAREN 1 % GEL Apply  1 application topically at bedtime. 07/31/13   Historical Provider, MD  zolpidem (AMBIEN) 10 MG tablet Take 5 mg by mouth at bedtime as needed.  07/19/13   Historical Provider, MD   BP 106/61  Pulse 96  Temp(Src) 103 F (39.4 C) (Oral)  Resp 19  SpO2 97% Physical Exam  Nursing note and vitals reviewed. Constitutional: She is oriented to person, place, and time. She appears well-developed.  HENT:  Head: Normocephalic and atraumatic.  Right Ear: External ear normal.  Left Ear: External ear normal.  Nose: Nose normal.  Mouth/Throat: Oropharynx is clear and moist.  Eyes: Conjunctivae and EOM are normal. Pupils are equal, round, and reactive to light.  Neck: Normal range of motion. Neck supple.  Cardiovascular: Tachycardia present.   Pulmonary/Chest: Effort normal. She has no wheezes. She has no rales.  Coughing on exam  Abdominal: Soft. Bowel sounds are normal.  Musculoskeletal: Normal range of motion.   Neurological: She is alert and oriented to person, place, and time. She has normal reflexes.  Skin: Skin is warm and dry.  Psychiatric: She has a normal mood and affect. Her behavior is normal. Judgment and thought content normal.    ED Course  Procedures (including critical care time) Labs Review Labs Reviewed  CBC WITH DIFFERENTIAL - Abnormal; Notable for the following:    WBC 13.4 (*)    RBC 3.79 (*)    HCT 34.4 (*)    MCHC 36.6 (*)    Neutrophils Relative % 81 (*)    Neutro Abs 10.8 (*)    Lymphocytes Relative 9 (*)    Monocytes Absolute 1.3 (*)    All other components within normal limits  CULTURE, BLOOD (ROUTINE X 2)  CULTURE, BLOOD (ROUTINE X 2)  URINE CULTURE  COMPREHENSIVE METABOLIC PANEL  URINALYSIS, ROUTINE W REFLEX MICROSCOPIC  I-STAT CG4 LACTIC ACID, ED    Imaging Review Dg Chest Port 1 View  09/02/2013   CLINICAL DATA:  58 year old female with hypertension and emesis. Initial encounter.  EXAM: PORTABLE CHEST - 1 VIEW  COMPARISON:  Chest CT 08/31/2013 and earlier.  FINDINGS: Portable AP semi upright view at 2239 hrs. The patient is mildly rotated to the right. Stable cardiac size and mediastinal contours. No pneumothorax or pneumoperitoneum. EKG leads and wires overlie the chest. No pulmonary edema, pleural effusion or consolidation.  IMPRESSION: No acute cardiopulmonary abnormality.   Electronically Signed   By: Lars Pinks M.D.   On: 09/02/2013 22:50     EKG Interpretation None      MDM   Final diagnoses:  None   Patient is tachycardic with systolic blood pressure of 105. She is given IV fluid bolus and is treated for empirical pneumonia with Rocephin and Zithromax. Patient then had chest x-Henretter Piekarski done which does not show acute infiltrate but unclear if this is because of volume depletion as patient is symptomatic with cough. Blood cultures have also been obtained. The first point of care lactic acid is normal. She does use slightly tachycardic with heart rate of  104. Patient with sbp 96 and hr 97 after 1.5 liters ns.   Discussed with Dr. Wynelle Cleveland and she will see for admission.   Shaune Pollack, MD 09/03/13 [phone]

## 2013-09-02 NOTE — ED Notes (Signed)
Per EMS: Pt saw doctor yesterday, diagnosed with allergies. Pt went to PCP today for N/V. Pt vomiting for past 4 hrs. Initial BP 90/68. After 200 mL NS up to 110/70. Pt given 4 mg Zofran. Pt also has fever.

## 2013-09-03 ENCOUNTER — Encounter (HOSPITAL_COMMUNITY): Payer: Self-pay | Admitting: *Deleted

## 2013-09-03 DIAGNOSIS — I7300 Raynaud's syndrome without gangrene: Secondary | ICD-10-CM

## 2013-09-03 DIAGNOSIS — E876 Hypokalemia: Secondary | ICD-10-CM | POA: Diagnosis present

## 2013-09-03 DIAGNOSIS — J189 Pneumonia, unspecified organism: Secondary | ICD-10-CM

## 2013-09-03 LAB — CBC
HEMATOCRIT: 31.6 % — AB (ref 36.0–46.0)
HEMOGLOBIN: 11.4 g/dL — AB (ref 12.0–15.0)
MCH: 32.9 pg (ref 26.0–34.0)
MCHC: 36.1 g/dL — AB (ref 30.0–36.0)
MCV: 91.1 fL (ref 78.0–100.0)
Platelets: 149 10*3/uL — ABNORMAL LOW (ref 150–400)
RBC: 3.47 MIL/uL — ABNORMAL LOW (ref 3.87–5.11)
RDW: 12.3 % (ref 11.5–15.5)
WBC: 13.8 10*3/uL — ABNORMAL HIGH (ref 4.0–10.5)

## 2013-09-03 LAB — BASIC METABOLIC PANEL
BUN: 10 mg/dL (ref 6–23)
CHLORIDE: 109 meq/L (ref 96–112)
CO2: 23 mEq/L (ref 19–32)
CREATININE: 0.73 mg/dL (ref 0.50–1.10)
Calcium: 7.9 mg/dL — ABNORMAL LOW (ref 8.4–10.5)
GFR calc Af Amer: >90 mL/min (ref >90)
GFR calc non Af Amer: >90 mL/min (ref >90)
GLUCOSE: 132 mg/dL — AB (ref 70–99)
POTASSIUM: 4.3 meq/L (ref 3.7–5.3)
Sodium: 143 mEq/L (ref 137–147)

## 2013-09-03 LAB — INFLUENZA PANEL BY PCR (TYPE A & B)
H1N1 flu by pcr: NOT DETECTED
Influenza A By PCR: NEGATIVE
Influenza B By PCR: NEGATIVE

## 2013-09-03 MED ORDER — IBUPROFEN 800 MG PO TABS: 400.0000 mg | ORAL_TABLET | Freq: Four times a day (QID) | ORAL | Status: DC | PRN

## 2013-09-03 MED ORDER — ACETAMINOPHEN 325 MG PO TABS
650.0000 mg | ORAL_TABLET | Freq: Four times a day (QID) | ORAL | Status: DC | PRN
  Administered 2013-09-03 – 2013-09-06 (×11): 650 mg via ORAL
  Filled 2013-09-03 (×11): qty 2

## 2013-09-03 MED ORDER — DEXTROSE 5 % IV SOLN
500.0000 mg | Freq: Every day | INTRAVENOUS | Status: DC
  Administered 2013-09-03 – 2013-09-05 (×3): 500 mg via INTRAVENOUS
  Filled 2013-09-03 (×4): qty 500

## 2013-09-03 MED ORDER — POTASSIUM CHLORIDE 10 MEQ/100ML IV SOLN
10.0000 meq | INTRAVENOUS | Status: DC
  Filled 2013-09-03 (×4): qty 100

## 2013-09-03 MED ORDER — ENOXAPARIN SODIUM 40 MG/0.4ML ~~LOC~~ SOLN
40.0000 mg | SUBCUTANEOUS | Status: DC
  Administered 2013-09-03 – 2013-09-07 (×5): 40 mg via SUBCUTANEOUS
  Filled 2013-09-03 (×5): qty 0.4

## 2013-09-03 MED ORDER — DEXTROSE 5 % IV SOLN: 500.0000 mg | INTRAVENOUS | Status: DC

## 2013-09-03 MED ORDER — ACETAMINOPHEN 650 MG RE SUPP: 650.0000 mg | Freq: Four times a day (QID) | RECTAL | Status: DC | PRN

## 2013-09-03 MED ORDER — HYDROCODONE-HOMATROPINE 5-1.5 MG/5ML PO SYRP
5.0000 mL | ORAL_SOLUTION | Freq: Every evening | ORAL | Status: DC | PRN
Start: 2013-09-03 — End: 2013-09-07

## 2013-09-03 MED ORDER — POTASSIUM CHLORIDE CRYS ER 20 MEQ PO TBCR
40.0000 meq | EXTENDED_RELEASE_TABLET | Freq: Once | ORAL | Status: AC
  Administered 2013-09-03: 40 meq via ORAL
  Filled 2013-09-03: qty 2

## 2013-09-03 MED ORDER — HYDROCODONE-ACETAMINOPHEN 5-325 MG PO TABS
1.0000 | ORAL_TABLET | ORAL | Status: DC | PRN
Start: 2013-09-03 — End: 2013-09-07

## 2013-09-03 MED ORDER — ZOLPIDEM TARTRATE 5 MG PO TABS
5.0000 mg | ORAL_TABLET | Freq: Every evening | ORAL | Status: DC | PRN
  Administered 2013-09-03 – 2013-09-07 (×5): 5 mg via ORAL
  Filled 2013-09-03 (×5): qty 1

## 2013-09-03 NOTE — Plan of Care (Signed)
Problem: Phase I Progression Outcomes Goal: Hemodynamically stable Outcome: Progressing IVF at 125 cc/h

## 2013-09-03 NOTE — Progress Notes (Signed)
[facility] PROGRESS NOTE  Rebecca Marks NIO:[id number] DOB: [birth year] DOA: 09/02/2013 PCP: Unice Cobble, MD  Brief narrative: 59 y.o. female with PMH of Hep C and arthritis who presented to [facility] ED 09/02/2013 with fever, dehydration and severe weakness. Her BP was 90/51 on admission but it has improved to 106/68 with IV fluids. Tmax was 103 F. Oxygen saturation was 93% on room air. She was admitted for treatment of possible pneumonia.   Assessment/Plan:  Principal Problem:   Acute respiratory failure with hypoxia - likely secondary to community acquired pneumonia - started on azithromycin and rocpehin - follow up blood culture results - oxygen support via nasal canula to keep O2 saturation above 90% - follow up influenza PCR Active Problems:   Hypokalemia - resolved to WNL this am   Leukocytosis - likely due to pneumonia - management as above with azithromycin and rocephin   Code Status: full code  Family Communication: no family at the bedside  Disposition Plan: home when stable  Robbie Lis, MD  [facility] Pager [phone]  If 7PM-7AM, please contact night-coverage [email or web address] Password [email or web address] 09/03/2013, 10:52 AM   LOS: 1 day   Consultants:  None   Procedures:  None   Antibiotics:  Azithromycin 09/02/2013 --.  Rocephin 09/02/2013 -->  HPI/Subjective: No overnight events.  Objective: Filed Vitals:   09/03/13 0032 09/03/13 0033 09/03/13 0320 09/03/13 0548  BP: 91/53   90/51  Pulse: 88 100 87 96  Temp: 98.6 F (37 C)   100.6 F (38.1 C)  TempSrc: Oral   Oral  Resp: 16   18  Height: 5\' 6"  (1.676 m)     Weight: 56.427 kg (124 lb 6.4 oz)     SpO2: 95% 97% 100% 96%    Intake/Output Summary (Last 24 hours) at 09/03/13 1052 Last data filed at 09/03/13 0900  Gross per 24 hour  Intake   1384 ml  Output   2025 ml  Net   -641 ml    Exam:   General:  Pt is alert, follows commands appropriately, not in acute  distress  Cardiovascular: Regular rate and rhythm, S1/S2, no murmurs, no rubs, no gallops  Respiratory: Rhonchi in upper lung lobes, no wheezing  Abdomen: Soft, non tender, non distended, bowel sounds present, no guarding  Extremities: No edema, pulses DP and PT palpable bilaterally  Neuro: Grossly nonfocal  Data Reviewed: Basic Metabolic Panel:  Recent Labs Lab 09/02/13 2237 09/03/13 0537  NA 137 143  K 3.6* 4.3  CL 100 109  CO2 22 23  GLUCOSE 102* 132*  BUN 16 10  CREATININE 0.76 0.73  CALCIUM 8.5 7.9*   Liver Function Tests:  Recent Labs Lab 09/02/13 2237  AST 27  ALT 7  ALKPHOS 48  BILITOT 0.6  PROT 6.6  ALBUMIN 3.9   No results found for this basename: LIPASE, AMYLASE,  in the last 168 hours No results found for this basename: AMMONIA,  in the last 168 hours CBC:  Recent Labs Lab 09/02/13 2237 09/03/13 0537  WBC 13.4* 13.8*  NEUTROABS 10.8*  --   HGB 12.6 11.4*  HCT 34.4* 31.6*  MCV 90.8 91.1  PLT 153 149*   Cardiac Enzymes: No results found for this basename: CKTOTAL, CKMB, CKMBINDEX, TROPONINI,  in the last 168 hours BNP: No components found with this basename: POCBNP,  CBG: No results found for this basename: GLUCAP,  in the last 168 hours  No results found for this or any previous visit (from  the past 240 hour(s)).   Studies: Dg Chest Port 1 View 09/02/2013    IMPRESSION: No acute cardiopulmonary abnormality.      Scheduled Meds: . azithromycin  500 mg Intravenous QHS  . cefTRIAXone (ROCEPHIN)  IV  1 g Intravenous Q24H  . enoxaparin (LOVENOX) injection  40 mg Subcutaneous Q24H   Continuous Infusions: . sodium chloride 1,000 mL (09/03/13 0819)

## 2013-09-03 NOTE — ED Notes (Signed)
Hospitalist at bedside 

## 2013-09-03 NOTE — Progress Notes (Signed)
Patient admitted to 1305 from ED with fever, URI infection, and r/o Flu. Placed on droplet precautions. Patient oriented to room and unit. VSS. SBP 91. Afebrile.

## 2013-09-03 NOTE — Progress Notes (Signed)
Received report from [facility] in ED.

## 2013-09-04 ENCOUNTER — Inpatient Hospital Stay (HOSPITAL_COMMUNITY): Payer: BC Managed Care – PPO

## 2013-09-04 DIAGNOSIS — D72829 Elevated white blood cell count, unspecified: Secondary | ICD-10-CM

## 2013-09-04 LAB — BASIC METABOLIC PANEL
BUN: 8 mg/dL (ref 6–23)
CALCIUM: 7.9 mg/dL — AB (ref 8.4–10.5)
CO2: 22 mEq/L (ref 19–32)
Chloride: 109 mEq/L (ref 96–112)
Creatinine, Ser: 0.72 mg/dL (ref 0.50–1.10)
GFR calc non Af Amer: >90 mL/min (ref >90)
Glucose, Bld: 98 mg/dL (ref 70–99)
Potassium: 3.7 mEq/L (ref 3.7–5.3)
Sodium: 141 mEq/L (ref 137–147)

## 2013-09-04 LAB — BLOOD GAS, ARTERIAL
ACID-BASE DEFICIT: 1.3 mmol/L (ref 0.0–2.0)
Bicarbonate: 20.8 mEq/L (ref 20.0–24.0)
Drawn by: 331471
O2 SAT: 90.1 %
PCO2 ART: 28.1 mmHg — AB (ref 35.0–45.0)
Patient temperature: 98.6
TCO2: 18.7 mmol/L (ref 0–100)
pH, Arterial: 7.483 — ABNORMAL HIGH (ref 7.350–7.450)
pO2, Arterial: 51.3 mmHg — ABNORMAL LOW (ref 80.0–100.0)

## 2013-09-04 LAB — CBC
HCT: 29.2 % — ABNORMAL LOW (ref 36.0–46.0)
Hemoglobin: 10.7 g/dL — ABNORMAL LOW (ref 12.0–15.0)
MCH: 33.2 pg (ref 26.0–34.0)
MCHC: 36.6 g/dL — ABNORMAL HIGH (ref 30.0–36.0)
MCV: 90.7 fL (ref 78.0–100.0)
PLATELETS: 146 10*3/uL — AB (ref 150–400)
RBC: 3.22 MIL/uL — ABNORMAL LOW (ref 3.87–5.11)
RDW: 12.5 % (ref 11.5–15.5)
WBC: 7.1 10*3/uL (ref 4.0–10.5)

## 2013-09-04 LAB — URINE CULTURE

## 2013-09-04 LAB — PRO B NATRIURETIC PEPTIDE: PRO B NATRI PEPTIDE: 1147 pg/mL — AB (ref 0–125)

## 2013-09-04 LAB — TROPONIN I: Troponin I: <0.3 ng/mL (ref <0.30)

## 2013-09-04 MED ORDER — ONDANSETRON HCL 4 MG/2ML IJ SOLN
INTRAMUSCULAR | Status: AC
  Filled 2013-09-04: qty 2

## 2013-09-04 MED ORDER — ONDANSETRON HCL 4 MG/2ML IJ SOLN
4.0000 mg | Freq: Four times a day (QID) | INTRAMUSCULAR | Status: DC | PRN
  Administered 2013-09-05: 4 mg via INTRAVENOUS
  Filled 2013-09-04: qty 2

## 2013-09-04 MED ORDER — FUROSEMIDE 10 MG/ML IJ SOLN
40.0000 mg | Freq: Once | INTRAMUSCULAR | Status: AC
  Administered 2013-09-04: 40 mg via INTRAVENOUS
  Filled 2013-09-04: qty 4

## 2013-09-04 MED ORDER — LORAZEPAM 2 MG/ML IJ SOLN: 1.0000 mg | Freq: Three times a day (TID) | INTRAMUSCULAR | Status: DC | PRN

## 2013-09-04 MED ORDER — SODIUM CHLORIDE 0.9 % IV SOLN: 1000.0000 mL | INTRAVENOUS | Status: DC

## 2013-09-04 MED ORDER — ONDANSETRON HCL 4 MG/2ML IJ SOLN
4.0000 mg | Freq: Four times a day (QID) | INTRAMUSCULAR | Status: DC
  Administered 2013-09-04: 4 mg via INTRAVENOUS

## 2013-09-04 MED ORDER — GUAIFENESIN ER 600 MG PO TB12
1200.0000 mg | ORAL_TABLET | Freq: Two times a day (BID) | ORAL | Status: DC
Start: 2013-09-04 — End: 2013-09-07
  Administered 2013-09-04 – 2013-09-07 (×7): 1200 mg via ORAL
  Filled 2013-09-04 (×8): qty 2

## 2013-09-04 NOTE — Progress Notes (Signed)
[facility] PROGRESS NOTE  Rebecca Marks BLT:[id number] DOB: 1955-09-15 DOA: 09/02/2013 PCP: Unice Cobble, MD  Brief narrative: 58 y.o. female with PMH of Hep C and arthritis who presented to [facility] ED 09/02/2013 with fever, dehydration and severe weakness. Her BP was 90/51 on admission but it has improved to 106/68 with IV fluids. Tmax was 103 F. Oxygen saturation was 93% on room air. She was admitted for treatment of possible pneumonia.   Assessment/Plan:   Principal Problem:  Acute respiratory failure with hypoxia  - likely secondary to community acquired pneumonia; influenza negative; will obtain respiratory viral panel - appreciate ID consult and recommendations  - continue azithromycin and rocpehin  - follow up blood culture results  - oxygen support via nasal canula to keep O2 saturation above 90%   Active Problems:  Hypokalemia  - resolved and now WNL Leukocytosis  - likely due to pneumonia  - management as above with azithromycin and rocephin   Code Status: full code  Family Communication: no family at the bedside  Disposition Plan: home when stable   Consultants:  ID  Procedures:  None  Antibiotics:  Azithromycin 09/02/2013 --.  Rocephin 09/02/2013 -->   Robbie Lis, MD  [facility] Pager [phone]  If 7PM-7AM, please contact night-coverage [email or web address] Password [email or web address] 09/04/2013, 10:11 AM   LOS: 2 days     HPI/Subjective: Says she doesn't feel well this am.  Objective: Filed Vitals:   09/03/13 1415 09/03/13 2028 09/03/13 2206 09/04/13 0510  BP: 108/59 95/58  98/55  Pulse: 71 81  89  Temp: 98.5 F (36.9 C) 100.1 F (37.8 C) 98.9 F (37.2 C) 100.9 F (38.3 C)  TempSrc: Oral Oral  Oral  Resp: 16 16  16   Height:      Weight:      SpO2: 96% 97%  99%    Intake/Output Summary (Last 24 hours) at 09/04/13 1011 Last data filed at 09/04/13 0830  Gross per 24 hour  Intake   3035 ml  Output   2900 ml  Net    135 ml     Exam:   General:  Pt is alert, follows commands appropriately, not in acute distress  Cardiovascular: Regular rate and rhythm, S1/S2, no murmurs, no rubs, no gallops  Respiratory: Clear to auscultation bilaterally, no wheezing, no crackles, no rhonchi  Abdomen: Soft, non tender, non distended, bowel sounds present, no guarding  Extremities: No edema, pulses DP and PT palpable bilaterally  Neuro: Grossly nonfocal  Data Reviewed: Basic Metabolic Panel:  Recent Labs Lab 09/02/13 2237 09/03/13 0537 09/04/13 0633  NA 137 143 141  K 3.6* 4.3 3.7  CL 100 109 109  CO2 22 23 22   GLUCOSE 102* 132* 98  BUN 16 10 8   CREATININE 0.76 0.73 0.72  CALCIUM 8.5 7.9* 7.9*   Liver Function Tests:  Recent Labs Lab 09/02/13 2237  AST 27  ALT 7  ALKPHOS 48  BILITOT 0.6  PROT 6.6  ALBUMIN 3.9   No results found for this basename: LIPASE, AMYLASE,  in the last 168 hours No results found for this basename: AMMONIA,  in the last 168 hours CBC:  Recent Labs Lab 09/02/13 2237 09/03/13 0537 09/04/13 0633  WBC 13.4* 13.8* 7.1  NEUTROABS 10.8*  --   --   HGB 12.6 11.4* 10.7*  HCT 34.4* 31.6* 29.2*  MCV 90.8 91.1 90.7  PLT 153 149* 146*   Cardiac Enzymes: No results found for this basename: CKTOTAL, CKMB, CKMBINDEX, TROPONINI,  in the last 168 hours BNP: No components found with this basename: POCBNP,  CBG: No results found for this basename: GLUCAP,  in the last 168 hours  Recent Results (from the past 240 hour(s))  URINE CULTURE     Status: None   Collection Time    09/02/13 10:37 PM      Result Value Ref Range Status   Specimen Description URINE, CLEAN CATCH   Final   Special Requests NONE   Final   Culture  Setup Time     Final   Value: 09/03/2013 05:44     Performed at [place]     Final   Value: 5,000 COLONIES/ML     Performed at [facility]   Culture     Final   Value: INSIGNIFICANT GROWTH     Performed at [facility]   Report Status 09/04/2013 FINAL   Final     Studies: Dg Chest Port 1 View  09/02/2013   CLINICAL DATA:  58 year old female with hypertension and emesis. Initial encounter.  EXAM: PORTABLE CHEST - 1 VIEW  COMPARISON:  Chest CT 08/31/2013 and earlier.  FINDINGS: Portable AP semi upright view at 2239 hrs. The patient is mildly rotated to the right. Stable cardiac size and mediastinal contours. No pneumothorax or pneumoperitoneum. EKG leads and wires overlie the chest. No pulmonary edema, pleural effusion or consolidation.  IMPRESSION: No acute cardiopulmonary abnormality.   Electronically Signed   By: Lars Pinks M.D.   On: 09/02/2013 22:50    Scheduled Meds: . azithromycin  500 mg Intravenous QHS  . cefTRIAXone (ROCEPHIN)  IV  1 g Intravenous Q24H  . enoxaparin (LOVENOX) injection  40 mg Subcutaneous Q24H  . guaiFENesin  1,200 mg Oral BID   Continuous Infusions: . sodium chloride

## 2013-09-04 NOTE — Progress Notes (Addendum)
BNP elevated at 1147, order placed for 1 dose lasix 40 mg IV. 12 lead EKG showed NSR and the first set cardiac enzyme WNL. Transfer to telemetry  Will call cardio consult in am.  Leisa Lenz [facility]

## 2013-09-04 NOTE — Progress Notes (Signed)
Paged received from RN that pt complains of chest pressure. Order placed for CXR, BNP, ABG, 12 lead EKG, cardiac enzyme. Will follow up on these results and make further recommendations.  Leisa Lenz [facility] [phone]

## 2013-09-04 NOTE — Progress Notes (Signed)
Dr Charlies Silvers notified of results of blood gas & nausea, order for Zofran for nausea received.

## 2013-09-04 NOTE — Progress Notes (Signed)
Late note for 2000- 2130 09/04/13. Dr. Charlies Silvers paged with BNP results earlier, and order received to transfer patient to telemetry. Patient was aware of plan, and lasix given as ordered. Report was called to receiving RN, and patient transferred to room 1443 with no difficulty.

## 2013-09-05 DIAGNOSIS — I509 Heart failure, unspecified: Secondary | ICD-10-CM

## 2013-09-05 DIAGNOSIS — R5383 Other fatigue: Secondary | ICD-10-CM | POA: Diagnosis present

## 2013-09-05 DIAGNOSIS — J189 Pneumonia, unspecified organism: Secondary | ICD-10-CM | POA: Diagnosis present

## 2013-09-05 DIAGNOSIS — D649 Anemia, unspecified: Secondary | ICD-10-CM | POA: Diagnosis present

## 2013-09-05 LAB — RESPIRATORY VIRUS PANEL
ADENOVIRUS: NOT DETECTED
INFLUENZA A H1: NOT DETECTED
INFLUENZA B 1: NOT DETECTED
Influenza A H3: NOT DETECTED
Influenza A: NOT DETECTED
METAPNEUMOVIRUS: DETECTED — AB
Parainfluenza 1: NOT DETECTED
Parainfluenza 2: NOT DETECTED
Parainfluenza 3: NOT DETECTED
RHINOVIRUS: NOT DETECTED
Respiratory Syncytial Virus A: NOT DETECTED
Respiratory Syncytial Virus B: NOT DETECTED

## 2013-09-05 LAB — TROPONIN I
Troponin I: <0.3 ng/mL (ref <0.30)
Troponin I: <0.3 ng/mL (ref <0.30)

## 2013-09-05 MED ORDER — FUROSEMIDE 20 MG PO TABS
20.0000 mg | ORAL_TABLET | Freq: Every day | ORAL | Status: DC
  Administered 2013-09-05 – 2013-09-06 (×2): 20 mg via ORAL
  Filled 2013-09-05 (×3): qty 1

## 2013-09-05 MED ORDER — FUROSEMIDE 10 MG/ML IJ SOLN: 40.0000 mg | Freq: Once | INTRAMUSCULAR | Status: DC

## 2013-09-05 NOTE — Progress Notes (Addendum)
[facility] PROGRESS NOTE  Aki Burdin FXT:[id number] DOB: 1956/05/13 DOA: 09/02/2013 PCP: Unice Cobble, MD  Brief narrative: 58 y.o. female with PMH of Hep C and arthritis who presented to [facility] ED 09/02/2013 with fever, dehydration and severe weakness. Her BP was 90/51 on admission but it has improved to 106/68 with IV fluids. Tmax was 103 F. Oxygen saturation was 93% on room air. She was admitted for treatment of possible pneumonia.   Assessment/Plan:   Principal Problem:  Acute respiratory failure with hypoxia  - likely secondary to community acquired pneumonia - she was short of breath yesterday and her CXR showed possible interstitial edema and BNP was 1147. We stopped IV fluids and gave 1 dose IV lasix 40 mg IV. The 12 lead EKG showed NSR and 3 sets of cardiac enzymes are normal. She feels better this am. We put her  On low dose PO lasix while she is in hospital. - respiratory viral panel is pending - influenza PCR is negative; blood and urine culture results show no growth to date - appreciate ID consult and recommendations  - continue azithromycin and rocpehin   Active Problems:  Hypokalemia  - resolved and now WNL  Leukocytosis  - likely due to pneumonia  - management as above with azithromycin and rocephin   Code Status: full code  Family Communication: no family at the bedside  Disposition Plan: home when stable   Consultants:  ID (Dr. Megan Salon, Jenny Reichmann) Procedures:  None  Antibiotics:  Azithromycin 09/02/2013 --> Rocephin 09/02/2013 -->   Robbie Lis, MD  [facility] Pager [phone]  If 7PM-7AM, please contact night-coverage [email or web address] Password [email or web address] 09/05/2013, 9:12 AM   LOS: 3 days    HPI/Subjective: Says she feels better.  Objective: Filed Vitals:   09/04/13 2100 09/04/13 2124 09/04/13 2350 09/05/13 0519  BP: 116/60  104/64 96/65  Pulse: 88  77 103  Temp: 98.5 F (36.9 C)  98.1 F (36.7 C) 99.1 F (37.3 C)  TempSrc: Oral   Oral Oral  Resp: 20  20 20   Height:      Weight:  59.7 kg (131 lb 9.8 oz)  56.5 kg (124 lb 9 oz)  SpO2: 93%  94% 92%    Intake/Output Summary (Last 24 hours) at 09/05/13 0912 Last data filed at 09/05/13 0830  Gross per 24 hour  Intake   2080 ml  Output   5900 ml  Net  -3820 ml    Exam:   General:  Pt is alert, follows commands appropriately, not in acute distress  Cardiovascular: Regular rate and rhythm, S1/S2, no murmurs, no rubs, no gallops  Respiratory: Clear to auscultation bilaterally, no wheezing, no crackles, no rhonchi  Abdomen: Soft, non tender, non distended, bowel sounds present, no guarding  Extremities: No edema, pulses DP and PT palpable bilaterally  Neuro: Grossly nonfocal  Data Reviewed: Basic Metabolic Panel:  Recent Labs Lab 09/02/13 2237 09/03/13 0537 09/04/13 0633  NA 137 143 141  K 3.6* 4.3 3.7  CL 100 109 109  CO2 22 23 22   GLUCOSE 102* 132* 98  BUN 16 10 8   CREATININE 0.76 0.73 0.72  CALCIUM 8.5 7.9* 7.9*   Liver Function Tests:  Recent Labs Lab 09/02/13 2237  AST 27  ALT 7  ALKPHOS 48  BILITOT 0.6  PROT 6.6  ALBUMIN 3.9   No results found for this basename: LIPASE, AMYLASE,  in the last 168 hours No results found for this basename: AMMONIA,  in the last  168 hours CBC:  Recent Labs Lab 09/02/13 2237 09/03/13 0537 09/04/13 0633  WBC 13.4* 13.8* 7.1  NEUTROABS 10.8*  --   --   HGB 12.6 11.4* 10.7*  HCT 34.4* 31.6* 29.2*  MCV 90.8 91.1 90.7  PLT 153 149* 146*   Cardiac Enzymes:  Recent Labs Lab 09/04/13 1826 09/05/13 0005 09/05/13 0630  TROPONINI <0.30 <0.30 <0.30   BNP: No components found with this basename: POCBNP,  CBG: No results found for this basename: GLUCAP,  in the last 168 hours  Recent Results (from the past 240 hour(s))  CULTURE, BLOOD (ROUTINE X 2)     Status: None   Collection Time    09/02/13 10:37 PM      Result Value Ref Range Status   Specimen Description BLOOD RIGHT ANTECUBITAL    Final   Special Requests BOTTLES DRAWN AEROBIC AND ANAEROBIC 5CC   Final   Culture  Setup Time     Final   Value: 09/03/2013 04:51     Performed at [facility]   Culture     Final   Value:        BLOOD CULTURE RECEIVED NO GROWTH TO DATE CULTURE WILL BE HELD FOR 5 DAYS BEFORE ISSUING A FINAL NEGATIVE REPORT     Performed at [facility]   Report Status PENDING   Incomplete  URINE CULTURE     Status: None   Collection Time    09/02/13 10:37 PM      Result Value Ref Range Status   Specimen Description URINE, CLEAN CATCH   Final   Special Requests NONE   Final   Culture  Setup Time     Final   Value: 09/03/2013 05:44     Performed at [place]     Final   Value: 5,000 COLONIES/ML     Performed at [facility]   Culture     Final   Value: INSIGNIFICANT GROWTH     Performed at [facility]   Report Status 09/04/2013 FINAL   Final  CULTURE, BLOOD (ROUTINE X 2)     Status: None   Collection Time    09/02/13 10:45 PM      Result Value Ref Range Status   Specimen Description BLOOD LEFT HAND   Final   Special Requests BOTTLES DRAWN AEROBIC AND ANAEROBIC 3.5CC   Final   Culture  Setup Time     Final   Value: 09/03/2013 04:51     Performed at [facility]   Culture     Final   Value:        BLOOD CULTURE RECEIVED NO GROWTH TO DATE CULTURE WILL BE HELD FOR 5 DAYS BEFORE ISSUING A FINAL NEGATIVE REPORT     Performed at [facility]   Report Status PENDING   Incomplete     Studies: Dg Chest Port 1 View 09/05/2013    IMPRESSION: Mild developing interstitial changes suggesting developing edema or interstitial pneumonitis.    Scheduled Meds: . azithromycin  500 mg Intravenous QHS  . cefTRIAXone (ROCEPHIN)  IV  1 g Intravenous Q24H  . enoxaparin (LOVENOX) injection  40 mg Subcutaneous Q24H  . furosemide  20 mg Oral Daily  . guaiFENesin  1,200 mg Oral BID   Continuous Infusions: . sodium chloride 1,000 mL  (09/04/13 0945)

## 2013-09-05 NOTE — Progress Notes (Signed)
  Echocardiogram 2D Echocardiogram has been performed.  Valinda Hoar 09/05/2013, 2:51 PM

## 2013-09-05 NOTE — Consult Note (Signed)
[facility] [place] for Infectious Disease    Date of Admission:  09/02/2013           Day 4 ceftriaxone        Day 4 azithromycin       Reason for Consult: Community acquired pneumonia    Referring Physician: Dr. Leisa Lenz  Primary Care Physician: Dr. Unice Cobble  Principal Problem:   CAP (community acquired pneumonia) Active Problems:   HEPATITIS C, HX OF   Osteoarthritis   Raynaud phenomenon   History of DVT (deep vein thrombosis)   Sepsis   Hypokalemia   Normocytic anemia   Chronic fatigue   . azithromycin  500 mg Intravenous QHS  . cefTRIAXone (ROCEPHIN)  IV  1 g Intravenous Q24H  . enoxaparin (LOVENOX) injection  40 mg Subcutaneous Q24H  . furosemide  20 mg Oral Daily  . guaiFENesin  1,200 mg Oral BID    Recommendations: 1. Continue current antibiotic   Assessment: I agree with current treatment for community-acquired pneumonia. I believe that she is improving slowly. He appears to have some superimposed volume overload but that appears to have improved as well with his continuation of her IV fluids and Lasix. I will followup tomorrow afternoon.    HPI: Rebecca Marks is a 58 y.o. female who recently developed a cough and sinus congestion. Upon initial evaluation it was felt he probably had seasonal allergies. However within 24 hours she was having fever, chills, worsening cough and shortness of breath leading to admission 4 days ago. She had fever and hypotension on admission. Her initial chest x-ray was unimpressive but she developed bilateral infiltrates over the past 48 hours. She was treated with IV fluids and empiric antibiotics for community-acquired pneumonia. She appears to be defervescing and her leukocytosis has improved. She had slightly more shortness of breath yesterday which I suspect may have been caused by superimposed volume overload. She feels better today.   Review of Systems: Pertinent items are noted in HPI.  Past Medical  History  Diagnosis Date  . Hepatitis C     contaminated blood @ her job  . Ruptured lumbar disc     4 herniated disc   . Arthritis     R wrist and R knee  . Allergy     History  Substance Use Topics  . Smoking status: Former Smoker -- 0.25 packs/day for 25 years    Types: Cigarettes    Quit date: 08/19/2013  . Smokeless tobacco: Never Used     Comment: smoked 1979-2008, up to 1 ppd. As of 08/01/13 1 cig every 2 weeks  . Alcohol Use: No    Family History  Problem Relation Age of Onset  . Diabetes Father   . Hypertension Father   . Heart attack Father     >55  . Lymphoma Father   . Stroke Mother 25    after bedridden post fall with BLE paralysis  . Breast cancer Mother      X 3  . Cancer Mother     breast  . Cancer Sister     breast  . Alzheimer's disease Maternal Grandmother   . Stroke Maternal Grandfather     in 3s   Allergies  Allergen Reactions  . Dextromethorphan     "drunk"; ataxic    OBJECTIVE: Blood pressure 101/55, pulse 89, temperature 98.4 F (36.9 C), temperature source Oral, resp. rate 18, height 5\' 6"  (1.676 m), weight  56.5 kg (124 lb 9 oz), SpO2 94.00%. General: She is alert, talkative and in no distress Skin: No rash Lungs: Prominent basilar crackles posteriorly Cor: Regular S1 and S2 no murmurs Abdomen: Soft and nontender with no palpable masses Joints extremities: Some changes of chronic osteoarthritis  Lab Results Lab Results  Component Value Date   WBC 7.1 09/04/2013   HGB 10.7* 09/04/2013   HCT 29.2* 09/04/2013   MCV 90.7 09/04/2013   PLT 146* 09/04/2013    Lab Results  Component Value Date   CREATININE 0.72 09/04/2013   BUN 8 09/04/2013   NA 141 09/04/2013   K 3.7 09/04/2013   CL 109 09/04/2013   CO2 22 09/04/2013    Lab Results  Component Value Date   ALT 7 09/02/2013   AST 27 09/02/2013   ALKPHOS 48 09/02/2013   BILITOT 0.6 09/02/2013     Microbiology: Recent Results (from the past 240 hour(s))  CULTURE, BLOOD (ROUTINE X 2)      Status: None   Collection Time    09/02/13 10:37 PM      Result Value Ref Range Status   Specimen Description BLOOD RIGHT ANTECUBITAL   Final   Special Requests BOTTLES DRAWN AEROBIC AND ANAEROBIC 5CC   Final   Culture  Setup Time     Final   Value: 09/03/2013 04:51     Performed at [facility]   Culture     Final   Value:        BLOOD CULTURE RECEIVED NO GROWTH TO DATE CULTURE WILL BE HELD FOR 5 DAYS BEFORE ISSUING A FINAL NEGATIVE REPORT     Performed at [facility]   Report Status PENDING   Incomplete  URINE CULTURE     Status: None   Collection Time    09/02/13 10:37 PM      Result Value Ref Range Status   Specimen Description URINE, CLEAN CATCH   Final   Special Requests NONE   Final   Culture  Setup Time     Final   Value: 09/03/2013 05:44     Performed at [facility]     Final   Value: 5,000 COLONIES/ML     Performed at [facility]   Culture     Final   Value: INSIGNIFICANT GROWTH     Performed at [facility]   Report Status 09/04/2013 FINAL   Final  CULTURE, BLOOD (ROUTINE X 2)     Status: None   Collection Time    09/02/13 10:45 PM      Result Value Ref Range Status   Specimen Description BLOOD LEFT HAND   Final   Special Requests BOTTLES DRAWN AEROBIC AND ANAEROBIC 3.5CC   Final   Culture  Setup Time     Final   Value: 09/03/2013 04:51     Performed at [facility]   Culture     Final   Value:        BLOOD CULTURE RECEIVED NO GROWTH TO DATE CULTURE WILL BE HELD FOR 5 DAYS BEFORE ISSUING A FINAL NEGATIVE REPORT     Performed at [facility]   Report Status PENDING   Incomplete    Michel Bickers, [place] [facility] [phone] pager   [phone] cell 09/05/2013, 4:02 PM

## 2013-09-06 DIAGNOSIS — D649 Anemia, unspecified: Secondary | ICD-10-CM

## 2013-09-06 LAB — PRO B NATRIURETIC PEPTIDE: PRO B NATRI PEPTIDE: 454.5 pg/mL — AB (ref 0–125)

## 2013-09-06 MED ORDER — LEVOFLOXACIN 500 MG PO TABS
500.0000 mg | ORAL_TABLET | Freq: Every day | ORAL | Status: DC
  Administered 2013-09-06 – 2013-09-07 (×2): 500 mg via ORAL
  Filled 2013-09-06 (×2): qty 1

## 2013-09-06 NOTE — Progress Notes (Signed)
Patient ID: Rebecca Marks, female   DOB: 11/03/1955, 58 y.o.   MRN: [id number]         [facility] for Infectious Disease    Date of Admission:  09/02/2013           Day 5 ceftriaxone        Day 5 azithromycin  Principal Problem:   CAP (community acquired pneumonia) Active Problems:   HEPATITIS C, HX OF   Osteoarthritis   Raynaud phenomenon   History of DVT (deep vein thrombosis)   Sepsis   Hypokalemia   Normocytic anemia   Chronic fatigue   . azithromycin  500 mg Intravenous QHS  . cefTRIAXone (ROCEPHIN)  IV  1 g Intravenous Q24H  . enoxaparin (LOVENOX) injection  40 mg Subcutaneous Q24H  . furosemide  20 mg Oral Daily  . guaiFENesin  1,200 mg Oral BID    Subjective: She is feeling better. She's been walking in the hallway today using her supplemental oxygen. She told me that her O2 saturation dropped to 88% when she was walking on room air. Her oxygen saturation remained in the high 90s with supplemental O2. Her cough has improved.  Objective: Temp:  [98.1 F (36.7 C)-101.4 F (38.6 C)] 98.1 F (36.7 C) (04/21 1351) Pulse Rate:  [71-81] 71 (04/21 1351) Resp:  [18-20] 20 (04/21 1351) BP: (103-114)/(56-68) 112/58 mmHg (04/21 1351) SpO2:  [88 %-96 %] 95 % (04/21 1351)  General: She looks better. She is smiling and in good spirits just returning from a walk in the hallway Skin: No rash Lungs: Still has crackles in the lung bases posteriorly but this is improved over yesterday Cor: Regular S1 and S2 with no murmurs  Lab Results Lab Results  Component Value Date   WBC 7.1 09/04/2013   HGB 10.7* 09/04/2013   HCT 29.2* 09/04/2013   MCV 90.7 09/04/2013   PLT 146* 09/04/2013    Lab Results  Component Value Date   CREATININE 0.72 09/04/2013   BUN 8 09/04/2013   NA 141 09/04/2013   K 3.7 09/04/2013   CL 109 09/04/2013   CO2 22 09/04/2013    Lab Results  Component Value Date   ALT 7 09/02/2013   AST 27 09/02/2013   ALKPHOS 48 09/02/2013   BILITOT 0.6 09/02/2013      Microbiology: Recent Results (from the past 240 hour(s))  CULTURE, BLOOD (ROUTINE X 2)     Status: None   Collection Time    09/02/13 10:37 PM      Result Value Ref Range Status   Specimen Description BLOOD RIGHT ANTECUBITAL   Final   Special Requests BOTTLES DRAWN AEROBIC AND ANAEROBIC 5CC   Final   Culture  Setup Time     Final   Value: 09/03/2013 04:51     Performed at [facility]   Culture     Final   Value:        BLOOD CULTURE RECEIVED NO GROWTH TO DATE CULTURE WILL BE HELD FOR 5 DAYS BEFORE ISSUING A FINAL NEGATIVE REPORT     Performed at [facility]   Report Status PENDING   Incomplete  URINE CULTURE     Status: None   Collection Time    09/02/13 10:37 PM      Result Value Ref Range Status   Specimen Description URINE, CLEAN CATCH   Final   Special Requests NONE   Final   Culture  Setup Time  Final   Value: 09/03/2013 05:44     Performed at [facility]     Final   Value: 5,000 COLONIES/ML     Performed at [facility]   Culture     Final   Value: INSIGNIFICANT GROWTH     Performed at [facility]   Report Status 09/04/2013 FINAL   Final  CULTURE, BLOOD (ROUTINE X 2)     Status: None   Collection Time    09/02/13 10:45 PM      Result Value Ref Range Status   Specimen Description BLOOD LEFT HAND   Final   Special Requests BOTTLES DRAWN AEROBIC AND ANAEROBIC 3.5CC   Final   Culture  Setup Time     Final   Value: 09/03/2013 04:51     Performed at [facility]   Culture     Final   Value:        BLOOD CULTURE RECEIVED NO GROWTH TO DATE CULTURE WILL BE HELD FOR 5 DAYS BEFORE ISSUING A FINAL NEGATIVE REPORT     Performed at [facility]   Report Status PENDING   Incomplete  RESPIRATORY VIRUS PANEL     Status: Abnormal   Collection Time    09/04/13  8:34 AM      Result Value Ref Range Status   Source - RVPAN NASAL SWAB   Corrected   Comment: CORRECTED ON 04/20 AT 2042: PREVIOUSLY  REPORTED AS NASAL SWAB   Respiratory Syncytial Virus A NOT DETECTED   Final   Respiratory Syncytial Virus B NOT DETECTED   Final   Influenza A NOT DETECTED   Final   Influenza B NOT DETECTED   Final   Parainfluenza 1 NOT DETECTED   Final   Parainfluenza 2 NOT DETECTED   Final   Parainfluenza 3 NOT DETECTED   Final   Metapneumovirus DETECTED (*)  Final   Rhinovirus NOT DETECTED   Final   Adenovirus NOT DETECTED   Final   Influenza A H1 NOT DETECTED   Final   Influenza A H3 NOT DETECTED   Final   Comment: (NOTE)           Normal Reference Range for each Analyte: NOT DETECTED     Testing performed using the Luminex xTAG Respiratory Viral Panel test     kit.     This test was developed and its performance characteristics determined     by [facility]. It has not been cleared or approved by the [name]     Food and Drug Administration. This test is used for clinical purposes.     It should not be regarded as investigational or for research. This     laboratory is certified under the Clinical Laboratory Improvement     Amendments of 1988 (CLIA) as qualified to perform high complexity     clinical laboratory testing.     Performed at [facility]    Studies/Results: Dg Chest Port 1 View  09/05/2013   CLINICAL DATA:  Fever, shortness of breath, nausea and vomiting, hepatitis-C.  EXAM: PORTABLE CHEST - 1 VIEW  COMPARISON:  DG CHEST 1V PORT dated 09/02/2013  FINDINGS: Slightly shallow inspiration. Heart size and pulmonary vascularity are normal. There is developing fine interstitial reticular nodular infiltration particularly in the lung bases which may indicate developing interstitial edema or pneumonitis. No pneumothorax. No pleural effusions.  IMPRESSION: Mild developing interstitial changes suggesting developing edema  or interstitial pneumonitis.   Electronically Signed   By: Lucienne Capers M.D.   On: 09/05/2013 00:32    Assessment: Her metapneumovirus PCR is positive. Adults  can develop symptomatic pneumonia from metapneumovirus infection and reinfection. It is also possible that she could have bacterial superinfection following a metapneumovirus infection. She is improving slowly. I favor switching her to oral levofloxacin and treating at least 48 more hours. She may be ready for discharge home soon.   Plan: 1. Simplify antibiotic therapy to oral levofloxacin   Michel Bickers, MD [facility] for [place] [phone] pager   [phone] cell 09/06/2013, 2:07 PM

## 2013-09-06 NOTE — Progress Notes (Signed)
[facility] PROGRESS NOTE  Rebecca Marks WYO:[id number] DOB: [birth year] DOA: 09/02/2013 PCP: Unice Cobble, MD  Brief narrative: 59 y.o. female with PMH of Hep C and arthritis who presented to [facility] ED 09/02/2013 with fever, dehydration and severe weakness. Her BP was 90/51 on admission but it has improved to 106/68 with IV fluids. Tmax was 103 F. Oxygen saturation was 93% on room air. She was admitted for treatment of pneumonia.   Assessment/Plan:   Principal Problem:  Acute respiratory failure with hypoxia  - likely secondary to community acquired pneumonia; she is metapneumovirus PCR positive. Per ID we will stop IV antibiotics and start PO levaquin for next 48 hours. Pleaser note azithromycin and rocephin were started on admission and will be stopped today. - influenza PCR is negative; blood and urine culture results show no growth to date  - appreciate ID recommendations   Active Problems:  Hypokalemia  - resolved and now WNL  Leukocytosis  - likely due to pneumonia, viral and bacterial  Shortness of breath - she was short of breath 4/19 and her CXR showed possible interstitial edema and BNP was 1147. We stopped IV fluids and gave 1 dose IV lasix 40 mg IV. The 12 lead EKG showed NSR and 3 sets of cardiac enzymes are normal. She feels better this am. We put her on low dose PO lasix, $RemoveB'20mg'yoYWsuIZ$  PO daily but we will stop this today. Repeat BNP down to 454.   Code Status: full code  Family Communication: no family at the bedside  Disposition Plan: home when stable   Consultants:  ID (Dr. Megan Salon, Jenny Reichmann) Procedures:  None  Antibiotics:  Azithromycin 09/02/2013 --> 09/06/2013 Rocephin 09/02/2013 --> 09/06/2013 Levaquin 09/06/2013 -->   Robbie Lis, MD  [facility] Pager [phone]  If 7PM-7AM, please contact night-coverage [email or web address] Password [facility] 09/06/2013, 2:57 PM   LOS: 4 days    HPI/Subjective: Feels better this am but has headache.   Objective: Filed  Vitals:   09/06/13 1331 09/06/13 1332 09/06/13 1333 09/06/13 1351  BP:    112/58  Pulse:    71  Temp:    98.1 F (36.7 C)  TempSrc:    Oral  Resp:    20  Height:      Weight:      SpO2: 96% 88% 93% 95%    Intake/Output Summary (Last 24 hours) at 09/06/13 1457 Last data filed at 09/06/13 0602  Gross per 24 hour  Intake 720.67 ml  Output   1900 ml  Net -1179.33 ml    Exam:   General:  Pt is alert, follows commands appropriately, not in acute distress  Cardiovascular: Regular rate and rhythm, S1/S2, no murmurs, no rubs, no gallops  Respiratory: Clear to auscultation bilaterally, no wheezing, no crackles, no rhonchi  Abdomen: Soft, non tender, non distended, bowel sounds present, no guarding  Extremities: No edema, pulses DP and PT palpable bilaterally  Neuro: Grossly nonfocal  Data Reviewed: Basic Metabolic Panel:  Recent Labs Lab 09/02/13 2237 09/03/13 0537 09/04/13 0633  NA 137 143 141  K 3.6* 4.3 3.7  CL 100 109 109  CO2 $Re'22 23 22  'BCi$ GLUCOSE 102* 132* 98  BUN $Re'16 10 8  'KZC$ CREATININE 0.76 0.73 0.72  CALCIUM 8.5 7.9* 7.9*   Liver Function Tests:  Recent Labs Lab 09/02/13 2237  AST 27  ALT 7  ALKPHOS 48  BILITOT 0.6  PROT 6.6  ALBUMIN 3.9   No results found for this basename: LIPASE, AMYLASE,  in the last 168 hours No results found for this basename: AMMONIA,  in the last 168 hours CBC:  Recent Labs Lab 09/02/13 2237 09/03/13 0537 09/04/13 0633  WBC 13.4* 13.8* 7.1  NEUTROABS 10.8*  --   --   HGB 12.6 11.4* 10.7*  HCT 34.4* 31.6* 29.2*  MCV 90.8 91.1 90.7  PLT 153 149* 146*   Cardiac Enzymes:  Recent Labs Lab 09/04/13 1826 09/05/13 0005 09/05/13 0630  TROPONINI <0.30 <0.30 <0.30   BNP: No components found with this basename: POCBNP,  CBG: No results found for this basename: GLUCAP,  in the last 168 hours  Recent Results (from the past 240 hour(s))  CULTURE, BLOOD (ROUTINE X 2)     Status: None   Collection Time    09/02/13 10:37  PM      Result Value Ref Range Status   Specimen Description BLOOD RIGHT ANTECUBITAL   Final   Special Requests BOTTLES DRAWN AEROBIC AND ANAEROBIC 5CC   Final   Culture  Setup Time     Final   Value: 09/03/2013 04:51     Performed at [facility]   Culture     Final   Value:        BLOOD CULTURE RECEIVED NO GROWTH TO DATE CULTURE WILL BE HELD FOR 5 DAYS BEFORE ISSUING A FINAL NEGATIVE REPORT     Performed at [facility]   Report Status PENDING   Incomplete  URINE CULTURE     Status: None   Collection Time    09/02/13 10:37 PM      Result Value Ref Range Status   Specimen Description URINE, CLEAN CATCH   Final   Special Requests NONE   Final   Culture  Setup Time     Final   Value: 09/03/2013 05:44     Performed at [place]     Final   Value: 5,000 COLONIES/ML     Performed at [facility]   Culture     Final   Value: INSIGNIFICANT GROWTH     Performed at [facility]   Report Status 09/04/2013 FINAL   Final  CULTURE, BLOOD (ROUTINE X 2)     Status: None   Collection Time    09/02/13 10:45 PM      Result Value Ref Range Status   Specimen Description BLOOD LEFT HAND   Final   Special Requests BOTTLES DRAWN AEROBIC AND ANAEROBIC 3.5CC   Final   Culture  Setup Time     Final   Value: 09/03/2013 04:51     Performed at [facility]   Culture     Final   Value:        BLOOD CULTURE RECEIVED NO GROWTH TO DATE CULTURE WILL BE HELD FOR 5 DAYS BEFORE ISSUING A FINAL NEGATIVE REPORT     Performed at [facility]   Report Status PENDING   Incomplete  RESPIRATORY VIRUS PANEL     Status: Abnormal   Collection Time    09/04/13  8:34 AM      Result Value Ref Range Status   Source - RVPAN NASAL SWAB   Corrected   Comment: CORRECTED ON 04/20 AT 2042: PREVIOUSLY REPORTED AS NASAL SWAB   Respiratory Syncytial Virus A NOT DETECTED   Final   Respiratory Syncytial Virus B NOT DETECTED   Final   Influenza A NOT  DETECTED   Final  Influenza B NOT DETECTED   Final   Parainfluenza 1 NOT DETECTED   Final   Parainfluenza 2 NOT DETECTED   Final   Parainfluenza 3 NOT DETECTED   Final   Metapneumovirus DETECTED (*)  Final   Rhinovirus NOT DETECTED   Final   Adenovirus NOT DETECTED   Final   Influenza A H1 NOT DETECTED   Final   Influenza A H3 NOT DETECTED   Final   Comment: (NOTE)           Normal Reference Range for each Analyte: NOT DETECTED     Testing performed using the Luminex xTAG Respiratory Viral Panel test     kit.     This test was developed and its performance characteristics determined     by [facility]. It has not been cleared or approved by the [name]     Food and Drug Administration. This test is used for clinical purposes.     It should not be regarded as investigational or for research. This     laboratory is certified under the [place] (CLIA) as qualified to perform high complexity     clinical laboratory testing.     Performed at [facility]     Studies: Dg Chest Port 1 View  09/05/2013   CLINICAL DATA:  Fever, shortness of breath, nausea and vomiting, hepatitis-C.  EXAM: PORTABLE CHEST - 1 VIEW  COMPARISON:  DG CHEST 1V PORT dated 09/02/2013  FINDINGS: Slightly shallow inspiration. Heart size and pulmonary vascularity are normal. There is developing fine interstitial reticular nodular infiltration particularly in the lung bases which may indicate developing interstitial edema or pneumonitis. No pneumothorax. No pleural effusions.  IMPRESSION: Mild developing interstitial changes suggesting developing edema or interstitial pneumonitis.   Electronically Signed   By: Lucienne Capers M.D.   On: 09/05/2013 00:32    Scheduled Meds: . enoxaparin (LOVENOX) injection  40 mg Subcutaneous Q24H  . furosemide  20 mg Oral Daily  . guaiFENesin  1,200 mg Oral BID  . levofloxacin  500 mg Oral Daily   Continuous Infusions: . sodium  chloride 1,000 mL (09/04/13 0945)

## 2013-09-06 NOTE — Progress Notes (Signed)
SATURATION QUALIFICATIONS: (This note is used to comply with regulatory documentation for home oxygen)  Patient Saturations on Room Air at Rest = 90%  Patient Saturations on Room Air while Ambulating = 88%  Patient Saturations on 2 Liters of oxygen while Ambulating = 93%  Please briefly explain why patient needs home oxygen: desaturation with ambulation without oxygen

## 2013-09-07 DIAGNOSIS — R5381 Other malaise: Secondary | ICD-10-CM

## 2013-09-07 DIAGNOSIS — R5383 Other fatigue: Secondary | ICD-10-CM

## 2013-09-07 MED ORDER — IBUPROFEN 400 MG PO TABS: 400.0000 mg | ORAL_TABLET | Freq: Every day | ORAL | Status: DC | PRN

## 2013-09-07 MED ORDER — LEVOFLOXACIN 500 MG PO TABS: 500.0000 mg | ORAL_TABLET | Freq: Every day | ORAL | Status: DC

## 2013-09-07 NOTE — Discharge Summary (Signed)
Physician Discharge Summary  Rebecca Marks VEL:[id number] DOB: February 17, 1956 DOA: 09/02/2013  PCP: Unice Cobble, MD  Admit date: 09/02/2013 Discharge date: 09/07/2013  Time spent: 45 minutes  Recommendations for Outpatient Follow-up:  1. PCP in 1 week 2. Repeat CXR in 1 week  Discharge Diagnoses:  Principal Problem:   CAP (community acquired pneumonia)   Meta pneumovirus    HEPATITIS C, HX OF   Osteoarthritis   Raynaud phenomenon   History of DVT (deep vein thrombosis)   Sepsis   Hypokalemia   Normocytic anemia   Chronic fatigue   Discharge Condition: stable  Diet recommendation: regular  Filed Weights   09/03/13 0032 09/04/13 2124 09/05/13 0519  Weight: 56.427 kg (124 lb 6.4 oz) 59.7 kg (131 lb 9.8 oz) 56.5 kg (124 lb 9 oz)    History of present illness:  Rebecca Marks is a 59 y.o. female with PMH of Hep C and arthritis who presents with fever, dehydration and severe weakness. She developed chest pain last wk in her left chest described as short and sharp. She had a work up done including cardiac enzymes and a CXR. CXR revealed a nodule and therefore a CT was done which was negative for a PE and for the nodule. She continued to have the pain and felt something was wrong. She noted that she was coughing as well and received a prescription for cough syrup. She became worse with fevers and sputum production. She is nauseated and feels very weak.    Hospital Course:  Acute respiratory failure with hypoxia  - likely secondary to community acquired pneumonia;  Respiratory panel positive for meta pneumovirus. - Improved with empiric IV Rocephin and Zithromax to cover for secondary bacterial infection. Also seen by Dr.Campbell ID in consultation -change to Po Levaquin for 2 more days -influenza PCR is negative; blood culture results show no growth to date   Hypokalemia  - resolved and now WNL   Leukocytosis  - likely due to pneumonia, viral and bacterial   -improved  Volume overload -Iatrogenic from FLUID resuscitation and third spacing from sepsis -resolved with 2 days of diuresis  -2D ECHO normal -Do not think she needs further diuretics   Consultations:  Infectious disease  Discharge Exam: Filed Vitals:   09/07/13 0550  BP: 101/59  Pulse: 70  Temp: 98.7 F (37.1 C)  Resp: 18    General: AAOx3 Cardiovascular: S1S2/RRR Respiratory: scattered ronchi  Discharge Instructions You were cared for by a hospitalist during your hospital stay. If you have any questions about your discharge medications or the care you received while you were in the hospital after you are discharged, you can call the unit and asked to speak with the hospitalist on call if the hospitalist that took care of you is not available. Once you are discharged, your primary care physician will handle any further medical issues. Please note that NO REFILLS for any discharge medications will be authorized once you are discharged, as it is imperative that you return to your primary care physician (or establish a relationship with a primary care physician if you do not have one) for your aftercare needs so that they can reassess your need for medications and monitor your lab values.  Discharge Orders   Future Appointments Provider Department Dept Phone   10/27/2013 2:30 PM Larey Dresser, MD [facility] [phone]   Future Orders Complete By Expires   Diet general  As directed    Increase activity slowly  As directed        Medication List         HYDROcodone-homatropine 5-1.5 MG/5ML syrup  Commonly known as:  HYCODAN  Take 5 mLs by mouth at bedtime as needed for cough.     ibuprofen 400 MG tablet  Commonly known as:  ADVIL,MOTRIN  Take 1 tablet (400 mg total) by mouth daily as needed.     levofloxacin 500 MG tablet  Commonly known as:  LEVAQUIN  Take 1 tablet (500 mg total) by mouth daily. For 1 day     multivitamin with minerals  Tabs tablet  Take 1 tablet by mouth daily.     OVER THE COUNTER MEDICATION  Take 1 capsule by mouth daily. Ginger $RemoveBefor'300mg'RmEsxZktgnQL$ /Turmeri'300mg'$      PHOSPHATIDYLSERINE PO  Take 200 mg by mouth daily.     TART CHERRY ADVANCED Caps  Take 100 capsules by mouth daily.     UNABLE TO FIND  Take 1,000 mg by mouth daily. Ionic Calcium     VOLTAREN 1 % Gel  Generic drug:  diclofenac sodium  Apply 1 application topically at bedtime. Applied to knee, hand, fingers.     zolpidem 10 MG tablet  Commonly known as:  AMBIEN  Take 5 mg by mouth at bedtime as needed.       Allergies  Allergen Reactions  . Dextromethorphan     "drunk"; ataxic       Follow-up Information   Follow up with Unice Cobble, MD. Schedule an appointment as soon as possible for a visit in 1 week.   Specialty:  Internal Medicine   Contact information:   [address] [address] [phone]        The results of significant diagnostics from this hospitalization (including imaging, microbiology, ancillary and laboratory) are listed below for reference.    Significant Diagnostic Studies: Dg Chest 2 View  08/18/2013   CLINICAL DATA:  Chest pain.  EXAM: CHEST  2 VIEW  COMPARISON:  None.  FINDINGS: Mediastinum and hilar structures are normal. Lungs are clear of acute infiltrates. No pleural effusion or pneumothorax. Questionable pulmonary nodule in the right pulmonary apex. Chest CT is suggested for further evaluation. Heart size and pulmonary vascularity normal. No acute bony abnormality identified.  IMPRESSION: 1. Questionable pulmonary nodule right pulmonary apex. Chest CT suggested for further evaluation . 2. No focal abnormality otherwise noted.   Electronically Signed   By: Marcello Moores  Register   On: 08/18/2013 15:04   Ct Chest Wo Contrast  08/31/2013   CLINICAL DATA:  Abnormal chest x-ray.  EXAM: CT CHEST WITHOUT CONTRAST  TECHNIQUE: Multidetector CT imaging of the chest was performed following the standard  protocol without IV contrast.  COMPARISON:  Chest x-ray 08/18/2013.  FINDINGS: D chest wall is unremarkable. No breast masses, supraclavicular or axillary adenopathy. Small scattered lymph nodes are noted. The thyroid gland appears normal. The bony thorax is intact. No destructive bone lesions or spinal canal compromise.  The heart is normal in size. No pericardial effusion. No mediastinal or hilar mass or adenopathy. The aorta is normal in caliber. No atherosclerotic changes.  Examination of the lung parenchyma demonstrates no acute pulmonary findings. No emphysematous changes or interstitial lung disease. No bronchiectasis. No worrisome pulmonary nodules or masses.  IMPRESSION: Normal chest CT.  No pulmonary lesions or acute pulmonary findings.   Electronically Signed   By: Kalman Jewels M.D.   On: 08/31/2013 16:06   Dg Chest [facility]  09/05/2013   CLINICAL DATA:  Fever, shortness of breath, nausea and vomiting, hepatitis-C.  EXAM: PORTABLE CHEST - 1 VIEW  COMPARISON:  DG CHEST 1V PORT dated 09/02/2013  FINDINGS: Slightly shallow inspiration. Heart size and pulmonary vascularity are normal. There is developing fine interstitial reticular nodular infiltration particularly in the lung bases which may indicate developing interstitial edema or pneumonitis. No pneumothorax. No pleural effusions.  IMPRESSION: Mild developing interstitial changes suggesting developing edema or interstitial pneumonitis.   Electronically Signed   By: Lucienne Capers M.D.   On: 09/05/2013 00:32   Dg Chest Port 1 View  09/02/2013   CLINICAL DATA:  58 year old female with hypertension and emesis. Initial encounter.  EXAM: PORTABLE CHEST - 1 VIEW  COMPARISON:  Chest CT 08/31/2013 and earlier.  FINDINGS: Portable AP semi upright view at 2239 hrs. The patient is mildly rotated to the right. Stable cardiac size and mediastinal contours. No pneumothorax or pneumoperitoneum. EKG leads and wires overlie the chest. No pulmonary edema,  pleural effusion or consolidation.  IMPRESSION: No acute cardiopulmonary abnormality.   Electronically Signed   By: Lars Pinks M.D.   On: 09/02/2013 22:50    Microbiology: Recent Results (from the past 240 hour(s))  CULTURE, BLOOD (ROUTINE X 2)     Status: None   Collection Time    09/02/13 10:37 PM      Result Value Ref Range Status   Specimen Description BLOOD RIGHT ANTECUBITAL   Final   Special Requests BOTTLES DRAWN AEROBIC AND ANAEROBIC 5CC   Final   Culture  Setup Time     Final   Value: 09/03/2013 04:51     Performed at [facility]   Culture     Final   Value:        BLOOD CULTURE RECEIVED NO GROWTH TO DATE CULTURE WILL BE HELD FOR 5 DAYS BEFORE ISSUING A FINAL NEGATIVE REPORT     Performed at [facility]   Report Status PENDING   Incomplete  URINE CULTURE     Status: None   Collection Time    09/02/13 10:37 PM      Result Value Ref Range Status   Specimen Description URINE, CLEAN CATCH   Final   Special Requests NONE   Final   Culture  Setup Time     Final   Value: 09/03/2013 05:44     Performed at [place]     Final   Value: 5,000 COLONIES/ML     Performed at [facility]   Culture     Final   Value: INSIGNIFICANT GROWTH     Performed at [facility]   Report Status 09/04/2013 FINAL   Final  CULTURE, BLOOD (ROUTINE X 2)     Status: None   Collection Time    09/02/13 10:45 PM      Result Value Ref Range Status   Specimen Description BLOOD LEFT HAND   Final   Special Requests BOTTLES DRAWN AEROBIC AND ANAEROBIC 3.5CC   Final   Culture  Setup Time     Final   Value: 09/03/2013 04:51     Performed at [facility]   Culture     Final   Value:        BLOOD CULTURE RECEIVED NO GROWTH TO DATE CULTURE WILL BE HELD FOR 5 DAYS BEFORE ISSUING A FINAL NEGATIVE REPORT     Performed at [facility]   Report  Status PENDING   Incomplete  RESPIRATORY VIRUS PANEL     Status: Abnormal   Collection Time     09/04/13  8:34 AM      Result Value Ref Range Status   Source - RVPAN NASAL SWAB   Corrected   Comment: CORRECTED ON 04/20 AT 2042: PREVIOUSLY REPORTED AS NASAL SWAB   Respiratory Syncytial Virus A NOT DETECTED   Final   Respiratory Syncytial Virus B NOT DETECTED   Final   Influenza A NOT DETECTED   Final   Influenza B NOT DETECTED   Final   Parainfluenza 1 NOT DETECTED   Final   Parainfluenza 2 NOT DETECTED   Final   Parainfluenza 3 NOT DETECTED   Final   Metapneumovirus DETECTED (*)  Final   Rhinovirus NOT DETECTED   Final   Adenovirus NOT DETECTED   Final   Influenza A H1 NOT DETECTED   Final   Influenza A H3 NOT DETECTED   Final   Comment: (NOTE)           Normal Reference Range for each Analyte: NOT DETECTED     Testing performed using the Luminex xTAG Respiratory Viral Panel test     kit.     This test was developed and its performance characteristics determined     by [facility]. It has not been cleared or approved by the [name]     Food and Drug Administration. This test is used for clinical purposes.     It should not be regarded as investigational or for research. This     laboratory is certified under the [place] (CLIA) as qualified to perform high complexity     clinical laboratory testing.     Performed at [facility]: Basic Metabolic Panel:  Recent Labs Lab 09/02/13 2237 09/03/13 0537 09/04/13 0633  NA 137 143 141  K 3.6* 4.3 3.7  CL 100 109 109  CO2 $Re'22 23 22  'FjL$ GLUCOSE 102* 132* 98  BUN $Re'16 10 8  'xXQ$ CREATININE 0.76 0.73 0.72  CALCIUM 8.5 7.9* 7.9*   Liver Function Tests:  Recent Labs Lab 09/02/13 2237  AST 27  ALT 7  ALKPHOS 48  BILITOT 0.6  PROT 6.6  ALBUMIN 3.9   No results found for this basename: LIPASE, AMYLASE,  in the last 168 hours No results found for this basename: AMMONIA,  in the last 168 hours CBC:  Recent Labs Lab 09/02/13 2237 09/03/13 0537 09/04/13 0633   WBC 13.4* 13.8* 7.1  NEUTROABS 10.8*  --   --   HGB 12.6 11.4* 10.7*  HCT 34.4* 31.6* 29.2*  MCV 90.8 91.1 90.7  PLT 153 149* 146*   Cardiac Enzymes:  Recent Labs Lab 09/04/13 1826 09/05/13 0005 09/05/13 0630  TROPONINI <0.30 <0.30 <0.30   BNP: BNP (last 3 results)  Recent Labs  09/04/13 1826 09/06/13 0333  PROBNP 1147.0* 454.5*   CBG: No results found for this basename: GLUCAP,  in the last 168 hours     Signed:  [facility] 09/07/2013, 1:35 PM

## 2013-09-07 NOTE — Progress Notes (Signed)
Pt amb 2 laps around unit w/ steady, indep gait. O2 sat on RA 92-95% with ambulation. Pt tol well.

## 2013-09-07 NOTE — Progress Notes (Signed)
D/C instructions reviewed w/ pt. Pt verbalizes understanding and all questions answered. Pt has called husband for ride home. Awaiting his arrival.

## 2013-09-09 LAB — CULTURE, BLOOD (ROUTINE X 2)
Culture: NO GROWTH
Culture: NO GROWTH

## 2013-09-12 ENCOUNTER — Encounter: Payer: Self-pay | Admitting: Internal Medicine

## 2013-09-12 ENCOUNTER — Ambulatory Visit (INDEPENDENT_AMBULATORY_CARE_PROVIDER_SITE_OTHER)
Admission: RE | Admit: 2013-09-12 | Discharge: 2013-09-12 | Disposition: A | Discharge status: Home / Self Care | Payer: BC Managed Care – PPO | Source: Ambulatory Visit | Attending: Internal Medicine | Admitting: Internal Medicine

## 2013-09-12 ENCOUNTER — Other Ambulatory Visit (INDEPENDENT_AMBULATORY_CARE_PROVIDER_SITE_OTHER): Payer: BC Managed Care – PPO

## 2013-09-12 ENCOUNTER — Ambulatory Visit (INDEPENDENT_AMBULATORY_CARE_PROVIDER_SITE_OTHER): Payer: BC Managed Care – PPO | Admitting: Internal Medicine

## 2013-09-12 VITALS — BP 124/76 | HR 77 | Temp 97.7°F | Resp 12 | Wt 118.0 lb

## 2013-09-12 DIAGNOSIS — J45909 Unspecified asthma, uncomplicated: Secondary | ICD-10-CM

## 2013-09-12 DIAGNOSIS — J189 Pneumonia, unspecified organism: Secondary | ICD-10-CM

## 2013-09-12 LAB — CBC WITH DIFFERENTIAL/PLATELET
Basophils Absolute: 0 10*3/uL (ref 0.0–0.1)
Basophils Relative: 0.3 % (ref 0.0–3.0)
Eosinophils Absolute: 0.2 10*3/uL (ref 0.0–0.7)
Eosinophils Relative: 1.7 % (ref 0.0–5.0)
HEMATOCRIT: 42.1 % (ref 36.0–46.0)
HEMOGLOBIN: 14.6 g/dL (ref 12.0–15.0)
LYMPHS ABS: 4.1 10*3/uL — AB (ref 0.7–4.0)
Lymphocytes Relative: 37.5 % (ref 12.0–46.0)
MCHC: 34.6 g/dL (ref 30.0–36.0)
MCV: 95.9 fl (ref 78.0–100.0)
MONO ABS: 0.9 10*3/uL (ref 0.1–1.0)
Monocytes Relative: 7.8 % (ref 3.0–12.0)
Neutro Abs: 5.8 10*3/uL (ref 1.4–7.7)
Neutrophils Relative %: 52.7 % (ref 43.0–77.0)
PLATELETS: 499 10*3/uL — AB (ref 150.0–400.0)
RBC: 4.39 Mil/uL (ref 3.87–5.11)
RDW: 12.4 % (ref 11.5–14.6)
WBC: 11 10*3/uL — AB (ref 4.5–10.5)

## 2013-09-12 MED ORDER — FLUTICASONE-SALMETEROL 250-50 MCG/DOSE IN AEPB: 1.0000 | INHALATION_SPRAY | Freq: Two times a day (BID) | RESPIRATORY_TRACT | Status: DC

## 2013-09-12 MED ORDER — PREDNISONE 20 MG PO TABS: 20.0000 mg | ORAL_TABLET | Freq: Two times a day (BID) | ORAL | Status: DC

## 2013-09-12 MED ORDER — FLUCONAZOLE 150 MG PO TABS: 150.0000 mg | ORAL_TABLET | Freq: Once | ORAL | Status: DC

## 2013-09-12 NOTE — Patient Instructions (Signed)
Your next office appointment will be determined based upon review of your pending labs & x-rays. Those instructions will be transmitted to you through My Chart . 

## 2013-09-12 NOTE — Addendum Note (Signed)
Addended by: Roma Schanz R on: 09/12/2013 04:44 PM   Modules accepted: Orders

## 2013-09-12 NOTE — Progress Notes (Signed)
   Subjective:    Patient ID: Rebecca Marks, female    DOB: 1955/11/07, 58 y.o.   MRN: [id number]  HPI She was an IP 4 17-4/22/15 with presumed community-acquired pneumonia. She describes facial and extremity edema with the IV fluids. Chest x-ray did reveal an interstitial edema type pattern without distinct  lobar pneumonia. BNP was up to 1147. PO2 was 51.3. ECHO was normal  She was given Rocephin and IV Zithromax. She was discharged on Levaquin which she ha s completed    Review of Systems  She has some dyspnea. She's been using the incentive spirometry and now has exceeded her inspiratory capacity goals as set.  She denies fever, chills, sweats, or pleuritic chest pain.  She has scant sputum.  No edema or PND @ home.     Objective:   Physical Exam   General appearance: Thin but in good health ;well nourished; no acute distress or increased work of breathing is present.  No  lymphadenopathy about the head, neck, or axilla noted.   Eyes: No conjunctival inflammation or lid edema is present. There is no scleral icterus.  Ears:  External ear exam shows no significant lesions or deformities.  Otoscopic examination reveals clear canals, tympanic membranes are intact bilaterally without bulging, retraction, inflammation or discharge.  Nose:  External nasal examination shows no deformity or inflammation. Nasal mucosa are pink and moist without lesions or exudates. No septal dislocation or deviation.No obstruction to airflow.   Oral exam: Dental hygiene is good; lips and gums are healthy appearing.There is no oropharyngeal erythema or exudate noted.   Neck:  No deformities, thyromegaly, masses, or tenderness noted.    No neck vein distention at 10  Heart:  Normal rate and regular rhythm. S1 and S2 normal without gallop, murmur, click, rub or other extra sounds.   Lungs: Scattered low-grade rhonchi, rales, wheezes, somewhat asymmetric.No increased work of breathing.    Abdomen  reveals no organomegaly, masses, or tenderness. No hepatojugular reflux  Extremities:  No cyanosis, edema, or clubbing  noted . Homans sign negative   Skin: Warm & dry w/o jaundice or tenting.         Assessment & Plan:  #1 community-acquired pneumonia with an atypical presentation of interstitial edema. Facial and extremity edema after IV fluids with an elevated BNP.  #2 post infectious reactive airway process.  See orders

## 2013-09-12 NOTE — Progress Notes (Signed)
Pre visit review using our clinic review tool, if applicable. No additional management support is needed unless otherwise documented below in the visit note. 

## 2013-10-19 ENCOUNTER — Telehealth: Payer: Self-pay | Admitting: *Deleted

## 2013-10-19 ENCOUNTER — Other Ambulatory Visit: Payer: Self-pay | Admitting: *Deleted

## 2013-10-19 MED ORDER — ALBUTEROL SULFATE HFA 108 (90 BASE) MCG/ACT IN AERS: 1.0000 | INHALATION_SPRAY | RESPIRATORY_TRACT | Status: DC | PRN

## 2013-10-19 NOTE — Telephone Encounter (Signed)
   Albuterol metered-dose inhaler 1-2 puffs every 4 hours as needed. If symptoms persist or progress; be seen @ local medical center immediately.

## 2013-10-19 NOTE — Telephone Encounter (Signed)
t called states she is at the [facility] and is experiencing difficulty breathing.  She is requesting an inhaler.  Please advise

## 2013-10-19 NOTE — Telephone Encounter (Signed)
Rx sent pt aware 

## 2013-10-24 ENCOUNTER — Ambulatory Visit (INDEPENDENT_AMBULATORY_CARE_PROVIDER_SITE_OTHER): Payer: BC Managed Care – PPO | Admitting: Internal Medicine

## 2013-10-24 ENCOUNTER — Encounter: Payer: Self-pay | Admitting: Internal Medicine

## 2013-10-24 VITALS — BP 100/70 | HR 85 | Temp 98.2°F | Wt 120.4 lb

## 2013-10-24 DIAGNOSIS — R0989 Other specified symptoms and signs involving the circulatory and respiratory systems: Secondary | ICD-10-CM

## 2013-10-24 DIAGNOSIS — R0600 Dyspnea, unspecified: Secondary | ICD-10-CM

## 2013-10-24 DIAGNOSIS — R0609 Other forms of dyspnea: Secondary | ICD-10-CM

## 2013-10-24 NOTE — Progress Notes (Signed)
   Subjective:    Patient ID: Rebecca Marks, female    DOB: 1955-08-08, 58 y.o.   MRN: [id number]  HPI  While out of town she requested a different inhaler as the Advair did not seem to be of benefit.  She has used the Proventil up to 2 times a day with subjective benefit  She opted not to take the oral steroids, she was concerned about long term adverse effects  She continues to have exertional dyspnea after climbing a flight of stairs.  She has an occasional cough without associated sputum production  She has no history of asthma.    Review of Systems She denies extrinsic symptoms of itchy, watery eyes, sneezing  She has no angioedema symptoms of swelling of the lips or tongue  She has not noted any wheezing.  She denies paroxysmal nocturnal dyspnea.        Objective:   Physical Exam   Thin but well-nourished and healthy in appearance.  No  lymphadenopathy about the head, neck, or axilla noted.   Eyes: No conjunctival inflammation or lid edema is present. There is no scleral icterus.  Ears:  External ear exam shows no significant lesions or deformities.  Otoscopic examination reveals clear canals, tympanic membranes are intact bilaterally without bulging, retraction, inflammation or discharge.  Nose:  External nasal examination shows no deformity or inflammation. Nasal mucosa are pink and moist without lesions or exudates. No septal dislocation or deviation.No obstruction to airflow.   Oral exam: Dental hygiene is good; lips and gums are healthy appearing.There is no oropharyngeal erythema or exudate noted.   Neck:  No deformities, thyromegaly, masses, or tenderness noted.   Supple with full range of motion without pain.   Heart:  Normal rate and regular rhythm. S1 and S2 normal without gallop, murmur, click, rub or other extra sounds.   Lungs:Chest clear to auscultation; no wheezes, rhonchi,rales ,or rubs present.No increased work of breathing.     Extremities:  No cyanosis, edema, or clubbing  noted    Skin: Warm & dry          Assessment & Plan:  #1 dyspnea See orders

## 2013-10-24 NOTE — Progress Notes (Signed)
Pre visit review using our clinic review tool, if applicable. No additional management support is needed unless otherwise documented below in the visit note. 

## 2013-10-24 NOTE — Patient Instructions (Signed)
If albuterol, the rescue agent, is needed more than 2-3 times per week except  to prevent exercise-induced symptoms; the maintenance agent should be used  preventatively on a daily basis. Cardiovascular exercise, this can be as simple a program as walking, is recommended 30-45 minutes 3-4 times per week. If you're not exercising you should take 6-8 weeks to build up to this level.

## 2013-10-27 ENCOUNTER — Ambulatory Visit: Payer: BC Managed Care – PPO | Admitting: Cardiology

## 2013-11-01 ENCOUNTER — Other Ambulatory Visit: Payer: Self-pay | Admitting: Internal Medicine

## 2013-11-01 ENCOUNTER — Encounter: Payer: Self-pay | Admitting: Internal Medicine

## 2013-11-01 DIAGNOSIS — R0600 Dyspnea, unspecified: Secondary | ICD-10-CM

## 2013-11-01 DIAGNOSIS — R0609 Other forms of dyspnea: Secondary | ICD-10-CM

## 2013-11-09 ENCOUNTER — Encounter: Payer: Self-pay | Admitting: Internal Medicine

## 2013-11-09 ENCOUNTER — Telehealth: Payer: Self-pay | Admitting: *Deleted

## 2013-11-09 NOTE — Telephone Encounter (Signed)
Left msg on triage stating that she have ? URI constantly coughing, not able to sleep. Wanting md to call in hycodan cough syrup. Called pt back no answer inform her md would need to see her before rx anything. Couldn't call in the cough syrup because it is a control substance too...Johny Chess

## 2013-11-10 ENCOUNTER — Other Ambulatory Visit: Payer: Self-pay | Admitting: Internal Medicine

## 2013-11-10 MED ORDER — HYDROCODONE-HOMATROPINE 5-1.5 MG/5ML PO SYRP: 5.0000 mL | ORAL_SOLUTION | Freq: Four times a day (QID) | ORAL | Status: DC | PRN

## 2013-11-22 ENCOUNTER — Ambulatory Visit (INDEPENDENT_AMBULATORY_CARE_PROVIDER_SITE_OTHER): Payer: BC Managed Care – PPO | Admitting: Internal Medicine

## 2013-11-22 ENCOUNTER — Telehealth: Payer: Self-pay | Admitting: *Deleted

## 2013-11-22 DIAGNOSIS — R0989 Other specified symptoms and signs involving the circulatory and respiratory systems: Secondary | ICD-10-CM

## 2013-11-22 DIAGNOSIS — R0600 Dyspnea, unspecified: Secondary | ICD-10-CM

## 2013-11-22 DIAGNOSIS — R0609 Other forms of dyspnea: Secondary | ICD-10-CM

## 2013-11-22 NOTE — Telephone Encounter (Signed)
Message copied by Earnstine Regal on Tue Nov 22, 2013  9:17 AM ------      Message from: Hendricks Limes      Created: Mon Nov 21, 2013  9:02 AM       Can you help schedule PFTs ; ordered 6/16 ? Thanks, Hopp ------

## 2013-11-22 NOTE — Progress Notes (Signed)
PFT done today. 

## 2013-11-22 NOTE — Telephone Encounter (Signed)
Gave msg to [facility]) she has contacted pt with status...Johny Chess

## 2013-11-23 LAB — PULMONARY FUNCTION TEST
DL/VA % PRED: 72 %
DL/VA: 3.58 ml/min/mmHg/L
DLCO UNC: 19.13 ml/min/mmHg
DLCO unc % pred: 73 %
FEF 25-75 Post: 2.22 L/sec
FEF 25-75 Pre: 2.19 L/sec
FEF2575-%CHANGE-POST: 1 %
FEF2575-%PRED-PRE: 86 %
FEF2575-%Pred-Post: 87 %
FEV1-%Change-Post: 0 %
FEV1-%PRED-POST: 102 %
FEV1-%Pred-Pre: 101 %
FEV1-POST: 2.8 L
FEV1-PRE: 2.78 L
FEV1FVC-%CHANGE-POST: 3 %
FEV1FVC-%PRED-PRE: 94 %
FEV6-%CHANGE-POST: -2 %
FEV6-%Pred-Post: 105 %
FEV6-%Pred-Pre: 108 %
FEV6-Post: 3.59 L
FEV6-Pre: 3.69 L
FEV6FVC-%Change-Post: 0 %
FEV6FVC-%PRED-POST: 103 %
FEV6FVC-%Pred-Pre: 102 %
FVC-%Change-Post: -3 %
FVC-%Pred-Post: 101 %
FVC-%Pred-Pre: 104 %
FVC-Post: 3.59 L
FVC-Pre: 3.71 L
Post FEV1/FVC ratio: 78 %
Post FEV6/FVC ratio: 100 %
Pre FEV1/FVC ratio: 75 %
Pre FEV6/FVC Ratio: 100 %
RV % pred: 95 %
RV: 1.92 L
TLC % pred: 118 %
TLC: 6.23 L

## 2013-11-29 ENCOUNTER — Encounter: Payer: Self-pay | Admitting: *Deleted

## 2013-11-29 ENCOUNTER — Other Ambulatory Visit: Payer: Self-pay | Admitting: *Deleted

## 2013-12-06 ENCOUNTER — Encounter: Payer: Self-pay | Admitting: Cardiology

## 2013-12-06 ENCOUNTER — Ambulatory Visit (INDEPENDENT_AMBULATORY_CARE_PROVIDER_SITE_OTHER): Payer: BC Managed Care – PPO | Admitting: Cardiology

## 2013-12-06 ENCOUNTER — Encounter: Payer: Self-pay | Admitting: Internal Medicine

## 2013-12-06 VITALS — BP 104/68 | HR 78 | Ht 66.0 in | Wt 120.0 lb

## 2013-12-06 DIAGNOSIS — R0609 Other forms of dyspnea: Secondary | ICD-10-CM

## 2013-12-06 DIAGNOSIS — R002 Palpitations: Secondary | ICD-10-CM

## 2013-12-06 DIAGNOSIS — R0989 Other specified symptoms and signs involving the circulatory and respiratory systems: Secondary | ICD-10-CM

## 2013-12-06 DIAGNOSIS — R0600 Dyspnea, unspecified: Secondary | ICD-10-CM

## 2013-12-06 NOTE — Patient Instructions (Addendum)
Your physician has recommended that you wear a holter monitor. Holter monitors are medical devices that record the heart's electrical activity. Doctors most often use these monitors to diagnose arrhythmias. Arrhythmias are problems with the speed or rhythm of the heartbeat. The monitor is a small, portable device. You can wear one while you do your normal daily activities. This is usually used to diagnose what is causing palpitations/syncope (passing out). [place] physician recommends that you return for a FASTING lipid profile/BNP.   Your physician wants you to follow-up in: 1 year with Dr Aundra Dubin. (July 2016). You will receive a reminder letter in the mail two months in advance. If you don't receive a letter, please call our office to schedule the follow-up appointment.

## 2013-12-07 ENCOUNTER — Other Ambulatory Visit (INDEPENDENT_AMBULATORY_CARE_PROVIDER_SITE_OTHER): Payer: BC Managed Care – PPO

## 2013-12-07 DIAGNOSIS — R002 Palpitations: Secondary | ICD-10-CM | POA: Insufficient documentation

## 2013-12-07 DIAGNOSIS — R0600 Dyspnea, unspecified: Secondary | ICD-10-CM | POA: Insufficient documentation

## 2013-12-07 LAB — LIPID PANEL
CHOLESTEROL: 153 mg/dL (ref 0–200)
HDL: 35.5 mg/dL — ABNORMAL LOW (ref >39.00)
LDL Cholesterol: 90 mg/dL (ref 0–99)
NonHDL: 117.5
Total CHOL/HDL Ratio: 4
Triglycerides: 136 mg/dL (ref 0.0–149.0)
VLDL: 27.2 mg/dL (ref 0.0–40.0)

## 2013-12-07 LAB — BRAIN NATRIURETIC PEPTIDE: PRO B NATRI PEPTIDE: 44 pg/mL (ref 0.0–100.0)

## 2013-12-07 NOTE — Progress Notes (Signed)
Patient ID: Rebecca Marks, female   DOB: 1956-05-03, 58 y.o.   MRN: [id number] PCP: Dr. Linna Darner  58 yo with PNA episode in 4/15 and subsequent exertional dyspnea presents for cardiology evaluation. In 4/15, she was hospitalized with PNA that turned out to be due to metapneumovirus.  During her hospital stay, she received a lot of IV fluid and developed lower extremity edema and worsening dyspnea.  BNP was 1147 when checked.  Echo was done and was normal.  This was thought to be iatrogenic volume overload.    Patient recovered slowly from PNA.  She initially was short of breath with any moderate exertion.  She has gradually improved and is now mostly back to normal.  She had PFTs done, showing minimal obstructive airways disease.  She has mild dyspnea after walking up 2 flights of steps or when walking up a hill.  Otherwise, no exertional dyspnea.  She is very active.  She has also noticed a fluttering sensation in her chest when she is lying in bed.  It will happen for a few seconds then resolve.  It happens most nights and has been present for > 1 year.  This sensation does worry her. No lightheadedness or syncope.   ECG: NSR, rightward axis  Labs (3/15): TSH normal Labs (4/15): BNP 1147 => 454   PMH: 1. Osteoarthritis 2. Raynauds phenomenon 3. HCV: Spontaneously cleared.  4. H/o DVT in her 74s while on OCPs.  5. PNA (metapneumovirus) in 4/15 6. Echo (4/15) with EF 60-65%, normal RV, no significant valvular abnormalities.  7. PFTs (7/15) with minimal obstructive airways disease  SH: Married, prior smoking, occasional ETOH.   FH: Father with MI x 3, earliest in his 30s.   ROS: All systems reviewed and negative except as per HPI.   Current Outpatient Prescriptions  Medication Sig Dispense Refill  . albuterol (PROVENTIL HFA;VENTOLIN HFA) 108 (90 BASE) MCG/ACT inhaler Inhale 1-2 puffs into the lungs every 4 (four) hours as needed for shortness of breath.  1 Inhaler  0  . ibuprofen  (ADVIL,MOTRIN) 400 MG tablet Take 800 mg by mouth at bedtime.      . Misc Natural Products (TART CHERRY ADVANCED) CAPS Take 100 capsules by mouth daily.      . Multiple Vitamin (MULTIVITAMIN WITH MINERALS) TABS tablet Take 1 tablet by mouth daily.      [name] OVER THE COUNTER MEDICATION Take 1 capsule by mouth daily. Ginger 300mg /Turmeric 300mg       . PHOSPHATIDYLSERINE PO Take 200 mg by mouth daily.       [name] UNABLE TO FIND Take 1,000 mg by mouth daily. Ionic Calcium      . VOLTAREN 1 % GEL Apply 1 application topically at bedtime. Applied to knee, hand, fingers.       No current facility-administered medications for this visit.    BP 104/68  Pulse 78  Ht 5\' 6"  (1.676 m)  Wt 120 lb (54.432 kg)  BMI 19.38 kg/m2 General: NAD Neck: No JVD, no thyromegaly or thyroid nodule.  Lungs: Clear to auscultation bilaterally with normal respiratory effort. CV: Nondisplaced PMI.  Heart regular S1/S2, no S3/S4, no murmur.  No peripheral edema.  No carotid bruit.  Normal pedal pulses.  Abdomen: Soft, nontender, no hepatosplenomegaly, no distention.  Skin: Intact without lesions or rashes.  Neurologic: Alert and oriented x 3.  Psych: Normal affect. Extremities: No clubbing or cyanosis.  HEENT: Normal.   Assessment/Plan: 1. Exertional dyspnea: This developed after her PNA in  4/15 and is finally mostly resolved.  At the time of PNA, she appears to have developed iatrogenic volume overload due to a large amount of IV fluid with elevated BNP (though this does suggest that she probably has a tendency towards diastolic CHF).  Echo was normal and PFTs were minimally abnormal. I suspect that dyspnea has been primarily due to some degree of post-PNA scarring.   - I will check BNP to make sure that it is no longer elevated.  2. Palpitations: She primarily notes palpitations when lying down in the bed at night.  I suspect PACs or PVCs.  TSH normal in 3/15.  I will arrange for a 24 hour holter monitor to assess.  3. She  has not had lipids done recently, I will check.   Loralie Champagne 12/07/2013

## 2013-12-12 ENCOUNTER — Encounter (INDEPENDENT_AMBULATORY_CARE_PROVIDER_SITE_OTHER): Payer: BC Managed Care – PPO

## 2013-12-12 ENCOUNTER — Encounter: Payer: Self-pay | Admitting: *Deleted

## 2013-12-12 DIAGNOSIS — R002 Palpitations: Secondary | ICD-10-CM

## 2013-12-12 NOTE — Progress Notes (Signed)
Patient ID: Rebecca Marks, female   DOB: 04-16-1956, 58 y.o.   MRN: [id number] EVO 24 hour holter monitor applied to patient.

## 2014-01-06 ENCOUNTER — Telehealth: Payer: Self-pay | Admitting: *Deleted

## 2014-01-06 NOTE — Telephone Encounter (Signed)
Dr Aundra Dubin reviewed monitor done 12/12/13:  Few PACs  O/W OK  LM on detailed voice mail

## 2014-03-09 ENCOUNTER — Other Ambulatory Visit (INDEPENDENT_AMBULATORY_CARE_PROVIDER_SITE_OTHER): Payer: BC Managed Care – PPO

## 2014-03-09 ENCOUNTER — Ambulatory Visit (INDEPENDENT_AMBULATORY_CARE_PROVIDER_SITE_OTHER): Payer: BC Managed Care – PPO | Admitting: Internal Medicine

## 2014-03-09 ENCOUNTER — Encounter: Payer: Self-pay | Admitting: Internal Medicine

## 2014-03-09 ENCOUNTER — Ambulatory Visit (INDEPENDENT_AMBULATORY_CARE_PROVIDER_SITE_OTHER)
Admission: RE | Admit: 2014-03-09 | Discharge: 2014-03-09 | Disposition: A | Discharge status: Home / Self Care | Payer: BC Managed Care – PPO | Source: Ambulatory Visit | Attending: Internal Medicine | Admitting: Internal Medicine

## 2014-03-09 VITALS — BP 108/76 | HR 71 | Temp 99.2°F | Resp 14 | Wt 120.4 lb

## 2014-03-09 DIAGNOSIS — R0609 Other forms of dyspnea: Secondary | ICD-10-CM

## 2014-03-09 DIAGNOSIS — R059 Cough, unspecified: Secondary | ICD-10-CM

## 2014-03-09 DIAGNOSIS — R5383 Other fatigue: Secondary | ICD-10-CM

## 2014-03-09 DIAGNOSIS — R05 Cough: Secondary | ICD-10-CM

## 2014-03-09 DIAGNOSIS — R0600 Dyspnea, unspecified: Secondary | ICD-10-CM

## 2014-03-09 LAB — CBC WITH DIFFERENTIAL/PLATELET
BASOS PCT: 0.4 % (ref 0.0–3.0)
Basophils Absolute: 0 10*3/uL (ref 0.0–0.1)
Eosinophils Absolute: 0 10*3/uL (ref 0.0–0.7)
Eosinophils Relative: 0.3 % (ref 0.0–5.0)
HCT: 43.1 % (ref 36.0–46.0)
Hemoglobin: 14.5 g/dL (ref 12.0–15.0)
Lymphocytes Relative: 33.4 % (ref 12.0–46.0)
Lymphs Abs: 2.8 10*3/uL (ref 0.7–4.0)
MCHC: 33.6 g/dL (ref 30.0–36.0)
MCV: 96.7 fl (ref 78.0–100.0)
MONO ABS: 0.7 10*3/uL (ref 0.1–1.0)
Monocytes Relative: 8.3 % (ref 3.0–12.0)
NEUTROS ABS: 4.9 10*3/uL (ref 1.4–7.7)
NEUTROS PCT: 57.6 % (ref 43.0–77.0)
Platelets: 224 10*3/uL (ref 150.0–400.0)
RBC: 4.45 Mil/uL (ref 3.87–5.11)
RDW: 12.2 % (ref 11.5–15.5)
WBC: 8.4 10*3/uL (ref 4.0–10.5)

## 2014-03-09 LAB — SEDIMENTATION RATE: SED RATE: 4 mm/h (ref 0–22)

## 2014-03-09 MED ORDER — DOXYCYCLINE HYCLATE 100 MG PO TABS: 100.0000 mg | ORAL_TABLET | Freq: Two times a day (BID) | ORAL | Status: DC

## 2014-03-09 NOTE — Patient Instructions (Signed)
Your next office appointment will be determined based upon review of your pending labs & x-rays. Those instructions will be transmitted to you through My Chart   Followup as needed for your acute issue. Please report any significant change in your symptoms. 

## 2014-03-09 NOTE — Progress Notes (Signed)
   Subjective:    Patient ID: Rebecca Marks, female    DOB: 1956/03/05, 58 y.o.   MRN: [id number]  HPI   She presents with  ongoing fatigue and exertional dyspnea. Symptoms have been present since she was hospitalized 4/17 -09/07/13 with community acquired pneumonia with Meta pneumovirus. She feels she could go to bed @ 5 pm but waits until 9 PM .She typically awakens by 3 am and has difficulty going back to sleep. She continues to have a cough productive of clear - brown sputum .  PFTs revealed only minimal obstruction & diffusion impairment.   She has had increased stress and describes some depression as they had to sell a family home.     Review of Systems  Constitutional: No significant change in weight; change in appetite. Eye: intermittent retinal buckling with vision blurring Cardiovascular: no palpitations; racing; irregularity ENT/GI: Some constipation. No diarrhea;hoarseness;dysphagia Derm: no change in nails,hair,skin. ? Bug bite this past Summer w/o associated rash. No definite tick exposure. Neuro: no numbness or tingling; tremor MS: pain DIP joints R index & 2nd fingers.Chronic LBP. Evaluated by Rheumatologist Endo: no temperature intolerance to heat ,cold      Objective:   Physical Exam   Pertinent or positive findings include: She is thin but appears well nourished. There are degenerative joint changes of the DIP joints of the right index and second fingers. Aorta is palpable but  there is no aneurysm.   Gen.:  in no acute distress Eyes: Extraocular motion intact; no lid lag or proptosis ,nystagmus Neck: full ROM; no masses ; thyroid normal  Heart: Normal rhythm and rate without significant murmur, gallop, or extra heart sounds Lungs: Chest clear to auscultation without rales,rales, wheezes Neuro:Deep tendon reflexes are equal and within normal limits; no tremor  Skin: Warm and dry without significant lesions or rashes; no onycholysis Lymphatic: no  cervical or axillary LA Psych: Normally communicative and interactive; no abnormal mood or affect clinically.           Assessment & Plan:    #1 exertional dyspnea with minimal obstructive and minimal perfusion defects on pulmonary function test   #2 profound fatigue  #3 protracted bronchitis with purulent sputum   #4 unidentified vector bite Summer 2015  Plan: See orders and recommendations. If these are negative stress test will be pursued

## 2014-03-09 NOTE — Progress Notes (Signed)
Pre visit review using our clinic review tool, if applicable. No additional management support is needed unless otherwise documented below in the visit note. 

## 2014-03-10 LAB — HEPATIC FUNCTION PANEL
ALBUMIN: 4 g/dL (ref 3.5–5.2)
ALT: 6 U/L (ref 0–35)
AST: 18 U/L (ref 0–37)
Alkaline Phosphatase: 55 U/L (ref 39–117)
BILIRUBIN DIRECT: 0.1 mg/dL (ref 0.0–0.3)
TOTAL PROTEIN: 7.5 g/dL (ref 6.0–8.3)
Total Bilirubin: 0.9 mg/dL (ref 0.2–1.2)

## 2014-03-10 LAB — BASIC METABOLIC PANEL
BUN: 21 mg/dL (ref 6–23)
CALCIUM: 9.5 mg/dL (ref 8.4–10.5)
CO2: 25 mEq/L (ref 19–32)
Chloride: 105 mEq/L (ref 96–112)
Creatinine, Ser: 0.8 mg/dL (ref 0.4–1.2)
GFR: 76.16 mL/min (ref >60.00)
Glucose, Bld: 80 mg/dL (ref 70–99)
POTASSIUM: 4.2 meq/L (ref 3.5–5.1)
SODIUM: 139 meq/L (ref 135–145)

## 2014-03-10 LAB — T4, FREE: FREE T4: 0.9 ng/dL (ref 0.60–1.60)

## 2014-03-10 LAB — TSH: TSH: 1.36 u[IU]/mL (ref 0.35–4.50)

## 2014-03-11 ENCOUNTER — Other Ambulatory Visit: Payer: Self-pay | Admitting: Internal Medicine

## 2014-03-11 DIAGNOSIS — R0609 Other forms of dyspnea: Secondary | ICD-10-CM

## 2014-03-11 DIAGNOSIS — R0600 Dyspnea, unspecified: Secondary | ICD-10-CM

## 2014-03-11 DIAGNOSIS — R5383 Other fatigue: Secondary | ICD-10-CM

## 2014-03-31 ENCOUNTER — Encounter: Payer: Self-pay | Admitting: Internal Medicine

## 2014-04-07 ENCOUNTER — Ambulatory Visit: Payer: BC Managed Care – PPO

## 2014-04-07 ENCOUNTER — Ambulatory Visit (INDEPENDENT_AMBULATORY_CARE_PROVIDER_SITE_OTHER): Payer: BC Managed Care – PPO | Admitting: *Deleted

## 2014-04-07 DIAGNOSIS — Z23 Encounter for immunization: Secondary | ICD-10-CM

## 2014-04-11 ENCOUNTER — Encounter (HOSPITAL_COMMUNITY): Payer: BC Managed Care – PPO

## 2014-04-28 ENCOUNTER — Encounter: Payer: Self-pay | Admitting: Physician Assistant

## 2014-04-28 ENCOUNTER — Ambulatory Visit (INDEPENDENT_AMBULATORY_CARE_PROVIDER_SITE_OTHER): Payer: BC Managed Care – PPO | Admitting: Physician Assistant

## 2014-04-28 VITALS — BP 126/74 | HR 71 | Temp 98.2°F | Wt 122.0 lb

## 2014-04-28 DIAGNOSIS — M6248 Contracture of muscle, other site: Secondary | ICD-10-CM

## 2014-04-28 DIAGNOSIS — S060X0A Concussion without loss of consciousness, initial encounter: Secondary | ICD-10-CM

## 2014-04-28 DIAGNOSIS — M62838 Other muscle spasm: Secondary | ICD-10-CM

## 2014-04-28 MED ORDER — METHOCARBAMOL 500 MG PO TABS
500.0000 mg | ORAL_TABLET | Freq: Three times a day (TID) | ORAL | Status: DC | PRN
Start: 2014-04-28 — End: 2014-06-05

## 2014-04-28 NOTE — Assessment & Plan Note (Signed)
With mild muscle spasm of neck.  Alternate Tylenol and Ibuprofen.  Rx Robaxin for muscle spasms.  Not to be used when driving or operating heavy machinery.  Apply topical Aspercreme to the area. Heating pad.  Post-concussive symptoms and course discussed with patient.  Follow-up next week if symptoms aren't resolving.

## 2014-04-28 NOTE — Progress Notes (Signed)
Patient presents to clinic today c/o intermittent headaches after hitting the back of her head on the car door 2 days ago.  Denies LOC or AMS.  Has been feeling well otherwise.  Denies vision changes, lightheadedness or dizziness.  Ambulating without difficulty.  Has not taken anything for symptoms.  Past Medical History  Diagnosis Date  . Hepatitis C     contaminated blood @ her job  . Ruptured lumbar disc     4 herniated disc   . Arthritis     R wrist and R knee  . Allergy   . CAP (community acquired pneumonia) 4/17-22/15    Rochephin & IV Zithromax & 3 days Levaquin    Current Outpatient Prescriptions on File Prior to Visit  Medication Sig Dispense Refill  . ibuprofen (ADVIL,MOTRIN) 400 MG tablet Take 800 mg by mouth as needed.     . Misc Natural Products (TART CHERRY ADVANCED) CAPS Take 100 capsules by mouth daily.    . Multiple Vitamin (MULTIVITAMIN WITH MINERALS) TABS tablet Take 1 tablet by mouth daily.    [name] OVER THE COUNTER MEDICATION Take 1 capsule by mouth daily. Ginger 300mg /Turmeric 300mg     . PHOSPHATIDYLSERINE PO Take 200 mg by mouth daily.     [name] UNABLE TO FIND Take 1,000 mg by mouth daily. Ionic Calcium    . VOLTAREN 1 % GEL Apply 1 application topically at bedtime. Applied to knee, hand, fingers.    [name] doxycycline (VIBRA-TABS) 100 MG tablet Take 1 tablet (100 mg total) by mouth 2 (two) times daily. (Patient not taking: Reported on 04/28/2014) 20 tablet 0   No current facility-administered medications on file prior to visit.    Allergies  Allergen Reactions  . Dextromethorphan     "drunk"; ataxic    Family History  Problem Relation Age of Onset  . Diabetes Father   . Hypertension Father   . Heart attack Father     >55  . Lymphoma Father   . Stroke Mother 24    after bedridden post fall with BLE paralysis  . Breast cancer Mother      X 3  . Breast cancer Mother   . Breast cancer Sister   . Alzheimer's disease Maternal Grandmother   . Stroke Maternal  Grandfather     in 58s    History   Social History  . Marital Status: Married    Spouse Name: N/A    Number of Children: N/A  . Years of Education: N/A   Social History Main Topics  . Smoking status: Former Smoker -- 0.25 packs/day for 25 years    Types: Cigarettes    Quit date: 08/19/2013  . Smokeless tobacco: Never Used     Comment: smoked 1979-2008, up to 1 ppd. As of 08/01/13 1 cig every 2 weeks  . Alcohol Use: No  . Drug Use: No     Comment: History-long time ago  . Sexual Activity: Yes    Birth Control/ Protection: None   Other Topics Concern  . Not on file   Social History Narrative  . No narrative on file   Review of Systems - See HPI.  All other ROS are negative.  BP 126/74 mmHg  Pulse 71  Temp(Src) 98.2 F (36.8 C)  Wt 122 lb (55.339 kg)  SpO2 99%  Physical Exam  Constitutional: She is oriented to person, place, and time and well-developed, well-nourished, and in no distress.  HENT:  Head: Normocephalic and atraumatic.  Eyes: Conjunctivae are normal.  Neck: Neck supple.  Cardiovascular: Normal rate, regular rhythm, normal heart sounds and intact distal pulses.   Pulmonary/Chest: Effort normal and breath sounds normal. No respiratory distress. She has no wheezes. She has no rales. She exhibits no tenderness.  Neurological: She is alert and oriented to person, place, and time. No cranial nerve deficit. Gait normal. GCS score is 15.  Skin: Skin is warm and dry. No rash noted.  Psychiatric: Affect normal.  Vitals reviewed.   Recent Results (from the past 2160 hour(s))  Basic metabolic panel     Status: None   Collection Time: 03/09/14  2:22 PM  Result Value Ref Range   Sodium 139 135 - 145 mEq/L   Potassium 4.2 3.5 - 5.1 mEq/L   Chloride 105 96 - 112 mEq/L   CO2 25 19 - 32 mEq/L   Glucose, Bld 80 70 - 99 mg/dL   BUN 21 6 - 23 mg/dL   Creatinine, Ser 0.8 0.4 - 1.2 mg/dL   Calcium 9.5 8.4 - 10.5 mg/dL   GFR 76.16 >60.00 mL/min  CBC with  Differential     Status: None   Collection Time: 03/09/14  2:22 PM  Result Value Ref Range   WBC 8.4 4.0 - 10.5 K/uL   RBC 4.45 3.87 - 5.11 Mil/uL   Hemoglobin 14.5 12.0 - 15.0 g/dL   HCT 43.1 36.0 - 46.0 %   MCV 96.7 78.0 - 100.0 fl   MCHC 33.6 30.0 - 36.0 g/dL   RDW 12.2 11.5 - 15.5 %   Platelets 224.0 150.0 - 400.0 K/uL   Neutrophils Relative % 57.6 43.0 - 77.0 %   Lymphocytes Relative 33.4 12.0 - 46.0 %   Monocytes Relative 8.3 3.0 - 12.0 %   Eosinophils Relative 0.3 0.0 - 5.0 %   Basophils Relative 0.4 0.0 - 3.0 %   Neutro Abs 4.9 1.4 - 7.7 K/uL   Lymphs Abs 2.8 0.7 - 4.0 K/uL   Monocytes Absolute 0.7 0.1 - 1.0 K/uL   Eosinophils Absolute 0.0 0.0 - 0.7 K/uL   Basophils Absolute 0.0 0.0 - 0.1 K/uL  Hepatic function panel     Status: None   Collection Time: 03/09/14  2:22 PM  Result Value Ref Range   Total Bilirubin 0.9 0.2 - 1.2 mg/dL   Bilirubin, Direct 0.1 0.0 - 0.3 mg/dL   Alkaline Phosphatase 55 39 - 117 U/L   AST 18 0 - 37 U/L   ALT 6 0 - 35 U/L   Total Protein 7.5 6.0 - 8.3 g/dL   Albumin 4.0 3.5 - 5.2 g/dL  TSH     Status: None   Collection Time: 03/09/14  2:22 PM  Result Value Ref Range   TSH 1.36 0.35 - 4.50 uIU/mL  T4, free     Status: None   Collection Time: 03/09/14  2:22 PM  Result Value Ref Range   Free T4 0.90 0.60 - 1.60 ng/dL  Sedimentation rate     Status: None   Collection Time: 03/09/14  2:22 PM  Result Value Ref Range   Sed Rate 4 0 - 22 mm/hr    Assessment/Plan: Concussion without loss of consciousness With mild muscle spasm of neck.  Alternate Tylenol and Ibuprofen.  Rx Robaxin for muscle spasms.  Not to be used when driving or operating heavy machinery.  Apply topical Aspercreme to the area. Heating pad.  Post-concussive symptoms and course discussed with patient.  Follow-up next week if  symptoms aren't resolving.

## 2014-04-28 NOTE — Patient Instructions (Signed)
Please alternate tylenol and ibuprofen for headache.  Use Robaxin to relieve muscle tension.  Do not use before driving or operating heavy machinery. Apply topical Aspercreme or Icy Hot to the area.  Call or return to clinic if symptoms are not improving over the next few days.  Post-Concussion Syndrome Post-concussion syndrome describes the symptoms that can occur after a head injury. These symptoms can last from weeks to months. CAUSES  It is not clear why some head injuries cause post-concussion syndrome. It can occur whether your head injury was mild or severe and whether you were wearing head protection or not.  SIGNS AND SYMPTOMS  Memory difficulties.  Dizziness.  Headaches.  Double vision or blurry vision.  Sensitivity to light.  Hearing difficulties.  Depression.  Tiredness.  Weakness.  Difficulty with concentration.  Difficulty sleeping or staying asleep.  Vomiting.  Poor balance or instability on your feet.  Slow reaction time.  Difficulty learning and remembering things you have heard. DIAGNOSIS  There is no test to determine whether you have post-concussion syndrome. Your health care provider may order an imaging scan of your brain, such as a CT scan, to check for other problems that may be causing your symptoms (such as severe injury inside your skull). TREATMENT  Usually, these problems disappear over time without medical care. Your health care provider may prescribe medicine to help ease your symptoms. It is important to follow up with a neurologist to evaluate your recovery and address any lingering symptoms or issues. HOME CARE INSTRUCTIONS   Only take over-the-counter or prescription medicines for pain, discomfort, or fever as directed by your health care provider. Do not take aspirin. Aspirin can slow blood clotting.  Sleep with your head slightly elevated to help with headaches.  Avoid any situation where there is potential for another head injury  (football, hockey, soccer, basketball, martial arts, downhill snow sports, and horseback riding). Your condition will get worse every time you experience a concussion. You should avoid these activities until you are evaluated by the appropriate follow-up health care providers.  Keep all follow-up appointments as directed by your health care provider. SEEK IMMEDIATE MEDICAL CARE IF:  You develop confusion or unusual drowsiness.  You cannot wake the injured person.  You develop nausea or persistent, forceful vomiting.  You feel like you are moving when you are not (vertigo).  You notice the injured person's eyes moving rapidly back and forth. This may be a sign of vertigo.  You have convulsions or faint.  You have severe, persistent headaches that are not relieved by medicine.  You cannot use your arms or legs normally.  Your pupils change size.  You have clear or bloody discharge from the nose or ears.  Your problems are getting worse, not better. MAKE SURE YOU:  Understand these instructions.  Will watch your condition.  Will get help right away if you are not doing well or get worse. Document Released: 10/25/2001 Document Revised: 02/23/2013 Document Reviewed: 08/10/2013 [facility] Patient Information 2015 [place], [place]. This information is not intended to replace advice given to you by your health care provider. Make sure you discuss any questions you have with your health care provider.

## 2014-04-28 NOTE — Progress Notes (Signed)
Pre visit review using our clinic review tool, if applicable. No additional management support is needed unless otherwise documented below in the visit note. 

## 2014-05-01 ENCOUNTER — Telehealth: Payer: Self-pay | Admitting: *Deleted

## 2014-05-01 NOTE — Telephone Encounter (Signed)
[facility] Client [facility] Patient Name: Rebecca Marks Gender: Female DOB: April 14, 1956 Age: 58 Y 11 M 2 D Return Phone Number: [phone] (Primary) Address: City/State/Zip:  Client [place] [facility] - Client Client Site [place] - Day Physician [place] Type Call Call Type Triage / Clinical Relationship To Patient Self Return Phone Number [phone] (Primary) Chief Complaint Head Injury (non urgent symptom) Initial Comment Caller states she got hit by a car door behind her ear, having a headache. PreDisposition Go to Urgent Care/Walk-In Clinic Nurse Assessment Nurse: Vallery Sa, RN, [place] Date/Time Eilene Ghazi Time): 04/28/2014 1:44:29 PM Confirm and document reason for call. If symptomatic, describe symptoms. ---Caller states she had a car door hit her in the head 2-3 days ago and she has a headache. No cuts to the skin. Has the patient traveled out of the country within the last 30 days? ---No Does the patient require triage? ---Yes Related visit to physician within the last 2 weeks? ---No Does the PT have any chronic conditions? (i.e. diabetes, asthma, etc.) ---Yes List chronic conditions. ---Superbug infection April 2015 that she thinks is resolved, back injuries in the past Guidelines Guideline Title Affirmed Question Affirmed Notes Nurse Date/Time (Eastern Time) Head Injury Scalp swelling, bruise or pain (all triage questions negative) [place], RN, [facility] 04/28/2014 1:47:06 PM Disp. Time Eilene Ghazi Time) Disposition Final User 04/28/2014 1:52:22 PM [place], RN, [facility] Caller Understands: Yes Disagree/Comply: Comply Care Advice Given Per Guideline PLEASE NOTE: All timestamps contained within this report are represented as [place] Standard Time. CONFIDENTIALTY NOTICE: This fax transmission is intended only for the addressee. It contains  information that is legally privileged, confidential or otherwise protected from use or disclosure. If you are not the intended recipient, you are strictly prohibited from reviewing, disclosing, copying using or disseminating any of this information or taking any action in reliance on or regarding this information. If you have received this fax in error, please notify us immediately by telephone so that we can arrange for its return to [name]. Phone: [phone], Toll-Free: [phone], Fax: [phone] Page: 2 of 2 Call Id: [id number] Care Advice Given Per Guideline HOME CARE: You should be able to treat this at home. LOCAL COLD: * Apply a cold pack or an ice bag (wrapped in a moist towel) to the area for 20 minutes. Repeat in 1 hour, then every 4 hours while awake. * Continue this for the first 48 hours after an injury (Reason: to reduce the swelling and pain). OBSERVATION: * The head-injured person should be observed closely during the first 2 hours following the injury. * The head-injured person should be awoken every 4 hours for the first 24 hours; check the ability to walk and talk. PAIN MEDICINES: ACETAMINOPHEN (E.G., TYLENOL): EXPECTED COURSE: * Pain and swelling usually begin to improve 2 or 3 days after an injury. * Swelling is usually gone in 7 days. Pain and tenderness at the site may take 1-2 weeks to completely resolve. * Most head trauma only causes an injury to the scalp. CALL BACK IF: * Severe headache persists over 2 hours after ice pack and pain medications * Extremity weakness or numbness occurs * Slurred speech or blurred vision occurs * Vomiting occurs * You become worse. CARE ADVICE given per Head Injury (Adult) guideline. After Care Instructions Given Call Event Type User Date / Time Description

## 2014-05-19 HISTORY — PX: COLONOSCOPY: SHX174

## 2014-05-31 ENCOUNTER — Encounter: Payer: Self-pay | Admitting: Internal Medicine

## 2014-06-05 ENCOUNTER — Ambulatory Visit (INDEPENDENT_AMBULATORY_CARE_PROVIDER_SITE_OTHER): Payer: BLUE CROSS/BLUE SHIELD | Admitting: Internal Medicine

## 2014-06-05 ENCOUNTER — Encounter: Payer: Self-pay | Admitting: Internal Medicine

## 2014-06-05 VITALS — BP 110/74 | HR 68 | Temp 98.4°F | Ht 66.0 in | Wt 122.2 lb

## 2014-06-05 DIAGNOSIS — G44329 Chronic post-traumatic headache, not intractable: Secondary | ICD-10-CM

## 2014-06-05 MED ORDER — TRAMADOL HCL 50 MG PO TABS: 50.0000 mg | ORAL_TABLET | Freq: Three times a day (TID) | ORAL | Status: DC | PRN

## 2014-06-05 MED ORDER — METHOCARBAMOL 500 MG PO TABS: ORAL_TABLET | ORAL | Status: DC

## 2014-06-05 NOTE — Patient Instructions (Signed)

## 2014-06-05 NOTE — Progress Notes (Signed)
Pre visit review using our clinic review tool, if applicable. No additional management support is needed unless otherwise documented below in the visit note. 

## 2014-06-05 NOTE — Progress Notes (Signed)
   Subjective:    Patient ID: Rebecca Marks, female    DOB: [birth year], 59 y.o.   MRN: [id number]  HPI  She has had 3 traumatic injuries to the head.  Initial episode was 04/28/14; she was seen at another clinic and Tylenol and Motrin recommended  She had an identical episode 05/31/14. Both episodes involved trying to get into her car which was parked in a narrow space. When she attempted to squeeze into the opening the window of the car struck the left mastoid area causing her head to strike the frame of the car. She then had a whiplash phenomena striking the left side the head against the window again  Also the washing machine lid hit her on top of the head 1/17  She describes some difficulty with reading & comprehension since.  She had nausea on 1/16 but this resolved  She's had marked neck stiffness over the last 48 hours  Since the episodes she notes marked fatigue simply driving a car  The main symptoms are @ the right lateral skull area described as  dull with a "jaggy" type pain internally. This can last hours up to level VII.  She has had much "loss" the last year  & is seeing a therapist; Wellbutrin has been recommended.   She has a past medical history of migraine . She's not had a migraine recently. She has increased caffeine intake recently    Review of Systems She has no blurred vision, double vision, loss of vision  She's had minor tinnitus without hearing loss  She denies any bleeding dyscrasias.    Objective:   Physical Exam   There is no evidence of hemotympanum.   Gen.:  well-nourished; in no acute distress Eyes: Extraocular motion intact; no lid lag or proptosis ,no nystagmus Neck: full ROM; no masses ; thyroid normal  Heart: Normal rhythm and rate without significant murmur, gallop, or extra heart sounds Lungs: Chest clear to auscultation without rales,rales, wheezes Neuro:Deep tendon reflexes are equal and within normal limits; no tremor    Cranial nerve exam is normal. Gait including heel and toe walking is normal Skin: Warm and dry without significant lesions or rashes; no onycholysis Lymphatic: no cervical or axillary LA Psych: Normally communicative and interactive; no abnormal mood or affect clinically.           Assessment & Plan:  #1 posttraumatic headache; negative neurologic exam  #2 exogenous stress  Plan: See orders and recommendations

## 2014-08-08 ENCOUNTER — Ambulatory Visit: Payer: BLUE CROSS/BLUE SHIELD | Admitting: Family Medicine

## 2014-08-21 ENCOUNTER — Encounter: Payer: Self-pay | Admitting: Family Medicine

## 2014-08-21 ENCOUNTER — Ambulatory Visit (INDEPENDENT_AMBULATORY_CARE_PROVIDER_SITE_OTHER): Payer: BLUE CROSS/BLUE SHIELD | Admitting: Family Medicine

## 2014-08-21 VITALS — BP 110/78 | HR 87 | Wt 120.0 lb

## 2014-08-21 DIAGNOSIS — G8929 Other chronic pain: Secondary | ICD-10-CM

## 2014-08-21 DIAGNOSIS — M545 Other chronic pain: Secondary | ICD-10-CM | POA: Insufficient documentation

## 2014-08-21 DIAGNOSIS — M9903 Segmental and somatic dysfunction of lumbar region: Secondary | ICD-10-CM

## 2014-08-21 DIAGNOSIS — M9904 Segmental and somatic dysfunction of sacral region: Secondary | ICD-10-CM

## 2014-08-21 DIAGNOSIS — M6289 Other specified disorders of muscle: Secondary | ICD-10-CM

## 2014-08-21 DIAGNOSIS — M999 Biomechanical lesion, unspecified: Secondary | ICD-10-CM | POA: Insufficient documentation

## 2014-08-21 DIAGNOSIS — M9902 Segmental and somatic dysfunction of thoracic region: Secondary | ICD-10-CM | POA: Diagnosis not present

## 2014-08-21 NOTE — Progress Notes (Signed)
Pre visit review using our clinic review tool, if applicable. No additional management support is needed unless otherwise documented below in the visit note. 

## 2014-08-21 NOTE — Assessment & Plan Note (Signed)
Discussed with patient about the possibility of muscle fatigue being secondary to metabolic imbalances. We discussed the possibility of getting labs but we will hold at this time. I do think that vitamin D and iron supplementation could be beneficial with the low likelihood of having any side effects. Patient will do the iron 3 times a week. We also discussed protein supplementation as well as carbohydrate loading after significant activity. Patient will try to make these changes and come back and see me again in 3 weeks.

## 2014-08-21 NOTE — Assessment & Plan Note (Signed)
Patient's back pain is multifactorial. I do believe the patient likely has some underlying arthritis but we'll not get an x-ray today. We discussed Southern bone topical anti-inflammatories and patient may try this. Patient has other pain medications for breakthrough pain. Patient will try over-the-counter natural supplementations which I think will be beneficial in patient was given home exercises. Patient and will come back and see me again in 3 weeks for further evaluation.

## 2014-08-21 NOTE — Patient Instructions (Addendum)
Good to see you.  Exercises 3 times a week.  Wearing good shoes can help Vitamin D 2000 IU daily Turmeric 500mg  twice daily CoQ 10 400mg  daily Iron 325 mg 3 times a week.  After walking would consider 4:1 ration of carbs to protein, example is chocolate milk Don't go longer then 3 hours without some kind of snack.  See me again in 3 weeks.

## 2014-08-21 NOTE — Progress Notes (Signed)
[name] [place], [place] [address] Phone: [phone] Subjective:    I'm seeing this patient by the request  of:  Unice Cobble, MD   CC: back pain and muscle aches  OLI:[id number] Zuma Hust is a 59 y.o. female coming in with complaint of back pain as well as muscle aches.patient has had a past medical history significant for for herniated disks in the lumbar spine nor diagnosed in 1993 and 94. Patient elected do conservative therapy and did not have any back surgery. Patient states that the pain is more of an intermittent problem. Patient does have more of a dull throbbing aching sensation. Patient states that the muscle pain seems to be worse. States that it after any activity she has more of a twitching of the muscles. Patient notices a more in her back as well as the legs. Nothing that spasms and nothing that keeps her from activity. Patient does not like to work out regularly. Patient attempts to watch her diet fairly regularly. Patient is not line pain medications but takes tramadol occasionally. Denies any weakness of the lower extremity but once again fatigue seems to be the biggest problem. Patient rates the problem a proximally 5 out of 10. Patient feels that she is young and should be active but doesn't.      Past medical history, social, surgical and family history all reviewed in electronic medical record.   Review of Systems: No headache, visual changes, nausea, vomiting, diarrhea, constipation, dizziness, abdominal pain, skin rash, fevers, chills, night sweats, weight loss, swollen lymph nodes, body aches, joint swelling, muscle aches, chest pain, shortness of breath, mood changes.   Objective Blood pressure 110/78, pulse 87, weight 120 lb (54.432 kg), SpO2 99 %.  General: No apparent distress alert and oriented x3 mood and affect normal, dressed appropriately.  HEENT: Pupils equal, extraocular movements intact    Respiratory: Patient's speak in full sentences and does not appear short of breath  Cardiovascular: No lower extremity edema, non tender, no erythema  Skin: Warm dry intact with no signs of infection or rash on extremities or on axial skeleton.  Abdomen: Soft nontender  Neuro: Cranial nerves II through XII are intact, neurovascularly intact in all extremities with 2+ DTRs and 2+ pulses.  Lymph: No lymphadenopathy of posterior or anterior cervical chain or axillae bilaterally.  Gait normal with good balance and coordination.  MSK:  Non tender with full range of motion and good stability and symmetric strength and tone of shoulders, elbows, wrist, hip, knee and ankles bilaterally.  Back Exam:  Inspection: Unremarkable  Motion: Flexion 35 deg, Extension 35 deg, Side Bending to 35 deg bilaterally,  Rotation to 35 deg bilaterally  SLR laying: Negative  XSLR laying: Negative  Palpable tenderness: mild tenderness of the paraspinal musculature of the right side of the lumbar spine FABER: negative. Sensory change: Gross sensation intact to all lumbar and sacral dermatomes.  Reflexes: 2+ at both patellar tendons, 2+ at achilles tendons, Babinski's downgoing.  Strength at foot  Plantar-flexion: 5/5 Dorsi-flexion: 5/5 Eversion: 5/5 Inversion: 5/5  Leg strength  Quad: 5/5 Hamstring: 5/5 Hip flexor: 4/5 Hip abductors: 4/5  Gait unremarkable.  Osteopathic manipulation Cervical C2 flexed rotated and side bent right Thoracic T3 extended rotated and side bent right T7 extended rotated and side bent left Lumbar L2 flexed rotated and side bent right Sacrum Left on left    Impression and Recommendations:     This case required medical  decision making of moderate complexity.

## 2014-08-21 NOTE — Assessment & Plan Note (Signed)
Decision today to treat with OMT was based on Physical Exam  After verbal consent patient was treated with HVLA, ME techniques in ago, thoracic lumbar and sacral areas  Patient tolerated the procedure well with improvement in symptoms  Patient given exercises, stretches and lifestyle modifications  See medications in patient instructions if given  Patient will follow up in 3 weeks

## 2014-09-11 ENCOUNTER — Ambulatory Visit (INDEPENDENT_AMBULATORY_CARE_PROVIDER_SITE_OTHER): Payer: BLUE CROSS/BLUE SHIELD | Admitting: Family Medicine

## 2014-09-11 ENCOUNTER — Encounter: Payer: Self-pay | Admitting: Family Medicine

## 2014-09-11 VITALS — BP 108/72 | HR 86 | Ht 66.0 in | Wt 121.0 lb

## 2014-09-11 DIAGNOSIS — M9902 Segmental and somatic dysfunction of thoracic region: Secondary | ICD-10-CM | POA: Diagnosis not present

## 2014-09-11 DIAGNOSIS — M9903 Segmental and somatic dysfunction of lumbar region: Secondary | ICD-10-CM | POA: Diagnosis not present

## 2014-09-11 DIAGNOSIS — M9904 Segmental and somatic dysfunction of sacral region: Secondary | ICD-10-CM

## 2014-09-11 DIAGNOSIS — M545 Low back pain, unspecified: Secondary | ICD-10-CM

## 2014-09-11 DIAGNOSIS — M6289 Other specified disorders of muscle: Secondary | ICD-10-CM | POA: Diagnosis not present

## 2014-09-11 DIAGNOSIS — M999 Biomechanical lesion, unspecified: Secondary | ICD-10-CM

## 2014-09-11 DIAGNOSIS — G8929 Other chronic pain: Secondary | ICD-10-CM

## 2014-09-11 NOTE — Patient Instructions (Signed)
Good to see you! You are doing great! This will take some time Continue the vitamins and consider valerian root for sleep and anxiety Stand on wall with heels, butt shoulder and head touching for goal of 5 minutes daily Duct tape tennis ball between shoulder blade s on chair with sitting long amount of time Keep monitor at eye level See me again in 4 weeks.

## 2014-09-11 NOTE — Assessment & Plan Note (Signed)
Patient is making some improvement. We discussed different postural changes as well as core strengthening exercises that I think will be helpful. Patient will continue the over-the-counter natural supplementations and patient was given another prescription for topical anti-inflammatories. Patient then will come back and see me again in 4-6 weeks for further evaluation and treatment.

## 2014-09-11 NOTE — Progress Notes (Signed)
Pre visit review using our clinic review tool, if applicable. No additional management support is needed unless otherwise documented below in the visit note. 

## 2014-09-11 NOTE — Progress Notes (Signed)
  [name] [place], [place] [address] Phone: [phone] Subjective:    CC: back pain and muscle aches follow up  [id number] Rebecca Marks is a 59 y.o. 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 Patient was seen previously and did have osteopathic manipulation as well as given home exercises, icing protocol, we discussed over-the-counter natural supplementations. Patient statesoverall she has made some improvement. Patient has noticed a direct correlation with anxiety and stress also contributing to some of her muscle aches and pains. Patient states as long she does he exercises on a fairly regular basis she has noticed improvement. Patient is taking the natural vitamins supplementations.  Muscle aches- has improved overall. Patient states that actually the diet changes we suggested has made a significant improvement in the muscle aches. Patient continues to work out regularly and is increasing her walking slowly. Patient is happy with the results.     Past medical history, social, surgical and family history all reviewed in electronic medical record.   Review of Systems: No headache, visual changes, nausea, vomiting, diarrhea, constipation, dizziness, abdominal pain, skin rash, fevers, chills, night sweats, weight loss, swollen lymph nodes, body aches, joint swelling, muscle aches, chest pain, shortness of breath, mood changes.   Objective Blood pressure 108/72, pulse 86, height 5\' 6"  (1.676 m), weight 121 lb (54.885 kg), SpO2 98 %.  General: No apparent distress alert and oriented x3 mood and affect normal, dressed appropriately.  HEENT: Pupils equal, extraocular movements intact  Respiratory: Patient's speak in full sentences and does not appear short of  breath  Cardiovascular: No lower extremity edema, non tender, no erythema  Skin: Warm dry intact with no signs of infection or rash on extremities or on axial skeleton.  Abdomen: Soft nontender  Neuro: Cranial nerves II through XII are intact, neurovascularly intact in all extremities with 2+ DTRs and 2+ pulses.  Lymph: No lymphadenopathy of posterior or anterior cervical chain or axillae bilaterally.  Gait normal with good balance and coordination.  MSK:  Non tender with full range of motion and good stability and symmetric strength and tone of shoulders, elbows, wrist, hip, knee and ankles bilaterally.  Back Exam:  Inspection: Unremarkable  Motion: Flexion 35 deg, Extension 35 deg, Side Bending to 35 deg bilaterally,  Rotation to 35 deg bilaterally  SLR laying: Negative  XSLR laying: Negative  Palpable tenderness: mild tenderness of the paraspinal musculature of the right side of the lumbar spine still present FABER: negative. Sensory change: Gross sensation intact to all lumbar and sacral dermatomes.  Reflexes: 2+ at both patellar tendons, 2+ at achilles tendons, Babinski's downgoing.  Strength at foot  Plantar-flexion: 5/5 Dorsi-flexion: 5/5 Eversion: 5/5 Inversion: 5/5  Leg strength  Quad: 5/5 Hamstring: 5/5 Hip flexor: 4/5 Hip abductors: 4/5  Gait unremarkable.  Osteopathic manipulation Cervical C2 flexed rotated and side bent right Thoracic T3 extended rotated and side bent right T7 extended rotated and side bent left Lumbar L2 flexed rotated and side bent right Sacrum Left on left samepattern as previously   Impression and Recommendations:     This case required medical decision making of moderate complexity.

## 2014-09-11 NOTE — Assessment & Plan Note (Signed)
Decision today to treat with OMT was based on Physical Exam  After verbal consent patient was treated with HVLA, ME techniques in ago, thoracic lumbar and sacral areas  Patient tolerated the procedure well with improvement in symptoms  Patient given exercises, stretches and lifestyle modifications  See medications in patient instructions if given  Patient will follow up in 4-6 weeks

## 2014-09-11 NOTE — Assessment & Plan Note (Signed)
Improve her diet changes at this time. Encourage patient to continue the exercises to work on her endurance as well.

## 2014-10-11 ENCOUNTER — Ambulatory Visit: Payer: BLUE CROSS/BLUE SHIELD | Admitting: Family Medicine

## 2014-10-24 ENCOUNTER — Encounter: Payer: Self-pay | Admitting: Internal Medicine

## 2014-11-15 ENCOUNTER — Ambulatory Visit (INDEPENDENT_AMBULATORY_CARE_PROVIDER_SITE_OTHER): Payer: BLUE CROSS/BLUE SHIELD | Admitting: Internal Medicine

## 2014-11-15 ENCOUNTER — Encounter: Payer: Self-pay | Admitting: Internal Medicine

## 2014-11-15 VITALS — BP 106/70 | HR 74 | Temp 98.3°F | Resp 16 | Wt 124.0 lb

## 2014-11-15 DIAGNOSIS — M791 Myalgia: Secondary | ICD-10-CM | POA: Diagnosis not present

## 2014-11-15 DIAGNOSIS — Z8619 Personal history of other infectious and parasitic diseases: Secondary | ICD-10-CM | POA: Diagnosis not present

## 2014-11-15 DIAGNOSIS — M609 Myositis, unspecified: Secondary | ICD-10-CM

## 2014-11-15 DIAGNOSIS — IMO0001 Reserved for inherently not codable concepts without codable children: Secondary | ICD-10-CM

## 2014-11-15 DIAGNOSIS — G479 Sleep disorder, unspecified: Secondary | ICD-10-CM | POA: Diagnosis not present

## 2014-11-15 MED ORDER — TRAMADOL HCL 50 MG PO TABS: 50.0000 mg | ORAL_TABLET | Freq: Three times a day (TID) | ORAL | Status: DC | PRN

## 2014-11-15 NOTE — Progress Notes (Signed)
Pre visit review using our clinic review tool, if applicable. No additional management support is needed unless otherwise documented below in the visit note. 

## 2014-11-15 NOTE — Progress Notes (Signed)
   Subjective:    Patient ID: Rebecca Marks, female    DOB: [birth year], 59 y.o.   MRN: [id number]  HPI She's had generalized pain described as a "dull glow" especially in the legs for 6 months. This weekend the pain was worse despite her resting 3-4 times during the day for 30 minutes. The pain can be up to level VIII-X. The pain is dull and constant. Walking exacerbates the pain.  She has been seen by a sports medicine specialist. She attempted see a rheumatologist for possible fibromyalgia; she was told that the doctor does not treat this condition  She's been taking Naprosyn & ibuprofen with minimal benefit. She is on glucosamine. Tramadol  @ night will decrease the pain and "break the cycle pain".  For 18 months she's had difficulty going to sleep as well as early awakening.  She has a past history of hepatitis C; titers been negative.   Colonoscopy is pending in the near future. She is on supplemental iron at the request of the sports medicine specialist.  Review of Systems Chest pain, palpitations, tachycardia, exertional dyspnea, paroxysmal nocturnal dyspnea, claudication or edema are absent. No unexplained weight loss, abdominal pain, significant dyspepsia, dysphagia, melena, rectal bleeding, or persistently small caliber stools. Dysuria, pyuria, hematuria, frequency, nocturia or polyuria are denied. Change in hair, skin, nails denied. No bowel changes of constipation or diarrhea. No intolerance to heat or cold.     Objective:   Physical Exam Pertinent or positive findings include: She has minor crepitus of the knees. She has DIP changes of the first and second fingers of the right hand.  General appearance :Thin but adequately nourished; in no distress.  Eyes: No conjunctival inflammation or scleral icterus is present.  Oral exam:  Lips and gums are healthy appearing.There is no oropharyngeal erythema or exudate noted. Dental hygiene is good.  Heart:  Normal rate and  regular rhythm. S1 and S2 normal without gallop, murmur, click, rub or other extra sounds    Lungs:Chest clear to auscultation; no wheezes, rhonchi,rales ,or rubs present.No increased work of breathing.   Abdomen: bowel sounds normal, soft and non-tender without masses, organomegaly or hernias noted.  No guarding or rebound.   Vascular : all pulses equal ; no bruits present.  Skin:Warm & dry.  Intact without suspicious lesions or rashes ; no tenting or jaundice   Lymphatic: No lymphadenopathy is noted about the head, neck, axilla   Neuro: Strength, tone & DTRs normal.        Assessment & Plan:  #1 myalgias  #2 chronic sleep disorder  #3 history of hepatitis C  Plan: See orders and recommendations

## 2014-11-15 NOTE — Patient Instructions (Signed)
To prevent sleep dysfunction follow these instructions for sleep hygiene. Do not read, watch TV, or eat in bed. Do not get into bed until you are ready to turn off the light &  to go to sleep. Do not ingest stimulants ( decongestants, diet pills, nicotine, caffeine) after the evening meal.Do not take daytime naps.Cardiovascular exercise, this can be as simple a program as walking, is recommended 30-45 minutes 3-4 times per week. If you're not exercising you should take 6-8 weeks to build up to this level. The Sleep Disorder  referral will be scheduled and you'll be notified of the time.Please call the Referral Co-Ordinator @ [phone] if you have not been notified of appointment time within 7-10 days.

## 2014-11-16 ENCOUNTER — Other Ambulatory Visit (INDEPENDENT_AMBULATORY_CARE_PROVIDER_SITE_OTHER): Payer: BLUE CROSS/BLUE SHIELD

## 2014-11-16 DIAGNOSIS — M791 Myalgia: Secondary | ICD-10-CM

## 2014-11-16 DIAGNOSIS — IMO0001 Reserved for inherently not codable concepts without codable children: Secondary | ICD-10-CM

## 2014-11-16 DIAGNOSIS — Z8619 Personal history of other infectious and parasitic diseases: Secondary | ICD-10-CM | POA: Diagnosis not present

## 2014-11-16 DIAGNOSIS — M609 Myositis, unspecified: Secondary | ICD-10-CM

## 2014-11-16 LAB — BASIC METABOLIC PANEL
BUN: 20 mg/dL (ref 6–23)
CALCIUM: 9.7 mg/dL (ref 8.4–10.5)
CHLORIDE: 102 meq/L (ref 96–112)
CO2: 31 mEq/L (ref 19–32)
Creatinine, Ser: 0.89 mg/dL (ref 0.40–1.20)
GFR: 69.12 mL/min (ref >60.00)
GLUCOSE: 93 mg/dL (ref 70–99)
Potassium: 4.1 mEq/L (ref 3.5–5.1)
SODIUM: 139 meq/L (ref 135–145)

## 2014-11-16 LAB — CBC WITH DIFFERENTIAL/PLATELET
BASOS ABS: 0 10*3/uL (ref 0.0–0.1)
BASOS PCT: 0.6 % (ref 0.0–3.0)
Eosinophils Absolute: 0.1 10*3/uL (ref 0.0–0.7)
Eosinophils Relative: 1.8 % (ref 0.0–5.0)
HCT: 43.2 % (ref 36.0–46.0)
Hemoglobin: 14.9 g/dL (ref 12.0–15.0)
LYMPHS ABS: 4 10*3/uL (ref 0.7–4.0)
Lymphocytes Relative: 51.3 % — ABNORMAL HIGH (ref 12.0–46.0)
MCHC: 34.6 g/dL (ref 30.0–36.0)
MCV: 95.6 fl (ref 78.0–100.0)
MONO ABS: 0.8 10*3/uL (ref 0.1–1.0)
MONOS PCT: 10.8 % (ref 3.0–12.0)
NEUTROS PCT: 35.5 % — AB (ref 43.0–77.0)
Neutro Abs: 2.8 10*3/uL (ref 1.4–7.7)
Platelets: 227 10*3/uL (ref 150.0–400.0)
RBC: 4.51 Mil/uL (ref 3.87–5.11)
RDW: 12.3 % (ref 11.5–15.5)
WBC: 7.8 10*3/uL (ref 4.0–10.5)

## 2014-11-16 LAB — CK: Total CK: 97 U/L (ref 7–177)

## 2014-11-16 LAB — HEPATIC FUNCTION PANEL
ALK PHOS: 46 U/L (ref 39–117)
ALT: 5 U/L (ref 0–35)
AST: 16 U/L (ref 0–37)
Albumin: 4.7 g/dL (ref 3.5–5.2)
Bilirubin, Direct: 0.1 mg/dL (ref 0.0–0.3)
TOTAL PROTEIN: 7.5 g/dL (ref 6.0–8.3)
Total Bilirubin: 0.8 mg/dL (ref 0.2–1.2)

## 2014-11-16 LAB — MAGNESIUM: MAGNESIUM: 2.1 mg/dL (ref 1.5–2.5)

## 2014-11-16 LAB — SEDIMENTATION RATE: Sed Rate: 7 mm/hr (ref 0–22)

## 2014-11-16 LAB — VITAMIN D 25 HYDROXY (VIT D DEFICIENCY, FRACTURES): VITD: 53.11 ng/mL (ref 30.00–100.00)

## 2014-11-17 ENCOUNTER — Telehealth: Payer: Self-pay | Admitting: Family Medicine

## 2014-11-17 ENCOUNTER — Encounter: Payer: Self-pay | Admitting: Internal Medicine

## 2014-11-17 ENCOUNTER — Other Ambulatory Visit: Payer: Self-pay | Admitting: Internal Medicine

## 2014-11-17 DIAGNOSIS — D72820 Lymphocytosis (symptomatic): Secondary | ICD-10-CM | POA: Insufficient documentation

## 2014-11-17 DIAGNOSIS — IMO0001 Reserved for inherently not codable concepts without codable children: Secondary | ICD-10-CM

## 2014-11-17 NOTE — Telephone Encounter (Signed)
Patient would like a call back as soon as possible.  You can reach patient at [phone].  Patient is afraid she will end up in the hospital this weekend.

## 2014-11-17 NOTE — Telephone Encounter (Signed)
Patient states she seen Dr. Linna Marks this week for pain over body.  Today she is having pain in lower part of body.  States Rebecca Marks is waiting on blood work.  She has sent message to Dr. Linna Marks to respond with results today but she would like to know if Dr. Tamala Julian could see her today to have a second opinion.  Patient states she could be here by 12.  Please call back in regards.

## 2014-11-17 NOTE — Telephone Encounter (Signed)
Pt is seeing dr Tamala Julian at 8:15a on 7.5.16.

## 2014-11-21 ENCOUNTER — Ambulatory Visit (INDEPENDENT_AMBULATORY_CARE_PROVIDER_SITE_OTHER): Payer: BLUE CROSS/BLUE SHIELD | Admitting: Family Medicine

## 2014-11-21 ENCOUNTER — Encounter: Payer: Self-pay | Admitting: Family Medicine

## 2014-11-21 ENCOUNTER — Other Ambulatory Visit (INDEPENDENT_AMBULATORY_CARE_PROVIDER_SITE_OTHER): Payer: BLUE CROSS/BLUE SHIELD

## 2014-11-21 VITALS — BP 102/70 | HR 66 | Ht 66.0 in | Wt 126.0 lb

## 2014-11-21 DIAGNOSIS — G8929 Other chronic pain: Secondary | ICD-10-CM

## 2014-11-21 DIAGNOSIS — M9903 Segmental and somatic dysfunction of lumbar region: Secondary | ICD-10-CM

## 2014-11-21 DIAGNOSIS — M6289 Other specified disorders of muscle: Secondary | ICD-10-CM

## 2014-11-21 DIAGNOSIS — M545 Low back pain, unspecified: Secondary | ICD-10-CM

## 2014-11-21 DIAGNOSIS — Z8619 Personal history of other infectious and parasitic diseases: Secondary | ICD-10-CM | POA: Diagnosis not present

## 2014-11-21 DIAGNOSIS — M2550 Pain in unspecified joint: Secondary | ICD-10-CM

## 2014-11-21 DIAGNOSIS — M999 Biomechanical lesion, unspecified: Secondary | ICD-10-CM

## 2014-11-21 LAB — TSH: TSH: 2.79 u[IU]/mL (ref 0.35–4.50)

## 2014-11-21 LAB — HEPATITIS C RNA QUANTITATIVE: HCV Quantitative: NOT DETECTED IU/mL (ref <15)

## 2014-11-21 LAB — CALCIUM: Calcium: 9.6 mg/dL (ref 8.4–10.5)

## 2014-11-21 NOTE — Assessment & Plan Note (Signed)
Quantifying labs still in progress

## 2014-11-21 NOTE — Assessment & Plan Note (Signed)
Decision today to treat with OMT was based on Physical Exam  After verbal consent patient was treated with HVLA, ME techniques in ago, thoracic lumbar and sacral areas  Patient tolerated the procedure well with improvement in symptoms  Patient given exercises, stretches and lifestyle modifications  See medications in patient instructions if given  Patient will follow up in 3 weeks

## 2014-11-21 NOTE — Patient Instructions (Addendum)
I am sorry you are hurting so much.  We will get labs today.  Continue your other meds and stay off the iron for now.  Continue to work on the diet.  I think a rheumatologist is a good idea.  See me again in 3 weeks.

## 2014-11-21 NOTE — Progress Notes (Signed)
  [name] [place],  [address] Phone: [phone] Subjective:    CC: back pain and muscle aches follow up  ASN:[id number] Rebecca Marks is a 59 y.o. 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 Patient was seen previously and did have osteopathic manipulation as well as given home exercises, icing protocol, we discussed over-the-counter natural supplementations. Patient was making improvements but unfortunately over the holiday weekend patient having significant more muscle pains and aches. Patient had more of the lower extremity pains. Patient signed primary care provider in certain labs were taken. These were unremarkable. Patient's hepatitis C titers are still pending. Patient states it seemed to be her whole body but now is only affecting her lower legs. States that the legs still hurt. States that any activity seems to give her more of a 15. By other physicians and not having any significant improvement. Patient states even at night it is waking her up.      Past medical history, social, surgical and family history all reviewed in electronic medical record.   Review of Systems: No headache, visual changes, nausea, vomiting, diarrhea, constipation, dizziness, abdominal pain, skin rash, fevers, chills, night sweats, weight loss, swollen lymph nodes, body aches, joint swelling, muscle aches, chest pain, shortness of breath, mood changes.   Objective Blood pressure 102/70, pulse 66, height 5\' 6"  (1.676 m), weight 126 lb (57.153 kg), SpO2 99 %.  General: No apparent distress alert and oriented x3 mood and affect normal, dressed appropriately.  HEENT: Pupils equal, extraocular movements intact  Respiratory: Patient's speak in full sentences and does not appear  short of breath  Cardiovascular: No lower extremity edema, non tender, no erythema  Skin: Warm dry intact with no signs of infection or rash on extremities or on axial skeleton.  Abdomen: Soft nontender  Neuro: Cranial nerves II through XII are intact, neurovascularly intact in all extremities with 2+ DTRs and 2+ pulses.  Lymph: No lymphadenopathy of posterior or anterior cervical chain or axillae bilaterally.  Gait normal with good balance and coordination.  MSK:  Non tender with full range of motion and good stability and symmetric strength and tone of shoulders, elbows, wrist, hip, knee and ankles bilaterally.  Back Exam:  Inspection: Unremarkable  Motion: Flexion 35 deg, Extension 35 deg, Side Bending to 35 deg bilaterally,  Rotation to 35 deg bilaterally  SLR laying: Negative  XSLR laying: Negative  Palpable tenderness: Mild tenderness of the paraspinal musculature. FABER: negative. Sensory change: Gross sensation intact to all lumbar and sacral dermatomes.  Reflexes: 2+ at both patellar tendons, 2+ at achilles tendons, Babinski's downgoing.  Strength at foot  Plantar-flexion: 5/5 Dorsi-flexion: 5/5 Eversion: 5/5 Inversion: 5/5  Leg strength  Quad: 5/5 Hamstring: 5/5 Hip flexor: 4/5 Hip abductors: 4/5  Gait unremarkable.  Osteopathic manipulation Cervical C2 flexed rotated and side bent right Thoracic T3 extended rotated and side bent right T4 extended rotated and side bent left Lumbar L2 flexed rotated and side bent right Sacrum Left on left Ileum Left anterior   Impression and Recommendations:     This case required medical decision making of moderate complexity.

## 2014-11-21 NOTE — Assessment & Plan Note (Signed)
As stated above. 

## 2014-11-21 NOTE — Progress Notes (Signed)
Pre visit review using our clinic review tool, if applicable. No additional management support is needed unless otherwise documented below in the visit note. 

## 2014-11-21 NOTE — Assessment & Plan Note (Signed)
Patient continues to have the chronic low back pain as well as multiple myalgias. Patient has this intermittently. Patient is concerned because she does have a family history of rheumatoid arthritis. We will get labs to rule out some of this. Patient has been referred to rheumatology for further evaluation by primary care provider. Patient is to stop some of the over-the-counter medications that could've been contribute in. Discussed other possible natural supplementations that could be beneficial during this time. Discussed with patient about increasing her activity slowly. We will rule out parathyroid for hypercalcemia as possible causes. Patient and will come back and see me again in 2-3 weeks for further evaluation and treatment.  Spent  25 minutes with patient face-to-face and had greater than 50% of counseling including as described above in assessment and plan.

## 2014-11-22 LAB — ANA: Anti Nuclear Antibody(ANA): NEGATIVE

## 2014-11-22 LAB — PAN-ANCA
ANCA Screen: NEGATIVE
Myeloperoxidase Abs: <1
Serine Protease 3: <1

## 2014-11-22 LAB — CYCLIC CITRUL PEPTIDE ANTIBODY, IGG

## 2014-11-22 LAB — PTH, INTACT AND CALCIUM
CALCIUM: 9.5 mg/dL (ref 8.4–10.5)
PTH: 32 pg/mL (ref 14–64)

## 2014-11-22 LAB — ANTI-DNA ANTIBODY, DOUBLE-STRANDED

## 2014-11-27 ENCOUNTER — Encounter: Payer: Self-pay | Admitting: Family Medicine

## 2014-11-27 MED ORDER — METHOCARBAMOL 500 MG PO TABS: 500.0000 mg | ORAL_TABLET | Freq: Three times a day (TID) | ORAL | Status: DC

## 2014-12-04 ENCOUNTER — Encounter: Payer: Self-pay | Admitting: Internal Medicine

## 2014-12-08 LAB — HM MAMMOGRAPHY: HM MAMMO: NEGATIVE

## 2014-12-12 ENCOUNTER — Encounter: Payer: Self-pay | Admitting: Family Medicine

## 2014-12-12 ENCOUNTER — Ambulatory Visit (INDEPENDENT_AMBULATORY_CARE_PROVIDER_SITE_OTHER): Payer: BLUE CROSS/BLUE SHIELD | Admitting: Family Medicine

## 2014-12-12 ENCOUNTER — Encounter: Payer: Self-pay | Admitting: Internal Medicine

## 2014-12-12 VITALS — BP 120/62 | HR 70 | Ht 66.0 in | Wt 120.0 lb

## 2014-12-12 DIAGNOSIS — M999 Biomechanical lesion, unspecified: Secondary | ICD-10-CM

## 2014-12-12 DIAGNOSIS — R5382 Chronic fatigue, unspecified: Secondary | ICD-10-CM

## 2014-12-12 DIAGNOSIS — M545 Low back pain, unspecified: Secondary | ICD-10-CM

## 2014-12-12 DIAGNOSIS — M9903 Segmental and somatic dysfunction of lumbar region: Secondary | ICD-10-CM

## 2014-12-12 DIAGNOSIS — M9902 Segmental and somatic dysfunction of thoracic region: Secondary | ICD-10-CM

## 2014-12-12 DIAGNOSIS — M9904 Segmental and somatic dysfunction of sacral region: Secondary | ICD-10-CM

## 2014-12-12 DIAGNOSIS — G8929 Other chronic pain: Secondary | ICD-10-CM

## 2014-12-12 NOTE — Progress Notes (Signed)
  [name] [place], [place] [address] Phone: [phone] Subjective:    CC: back pain and muscle aches follow up  [id number] Rebecca Marks is a 59 y.o. 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 Patient was seen previously and did have osteopathic manipulation as well as given home exercises, icing protocol, we discussed over-the-counter natural supplementations.  Patient continued to have significant muscle discomfort and we did get labs. Patient labs for autoimmune diseases were unremarkable. Patient has been referred to rheumatology due to these chronic complaints. Patient states overall she is doing better. Patient's rheumatology she thinks is on the right track. Patient has noticed less pain overall      Past medical history, social, surgical and family history all reviewed in electronic medical record.   Review of Systems: No headache, visual changes, nausea, vomiting, diarrhea, constipation, dizziness, abdominal pain, skin rash, fevers, chills, night sweats, weight loss, swollen lymph nodes, body aches, joint swelling, muscle aches, chest pain, shortness of breath, mood changes.   Objective Blood pressure 120/62, pulse 70, height 5\' 6"  (1.676 m), weight 120 lb (54.432 kg), SpO2 97 %.  General: No apparent distress alert and oriented x3 mood and affect normal, dressed appropriately.  HEENT: Pupils equal, extraocular movements intact  Respiratory: Patient's speak in full sentences and does not appear short of breath  Cardiovascular: No lower extremity edema, non tender, no erythema  Skin: Warm dry intact with no signs of infection or rash on extremities or on axial skeleton.  Abdomen: Soft nontender  Neuro: Cranial nerves II through XII are intact,  neurovascularly intact in all extremities with 2+ DTRs and 2+ pulses.  Lymph: No lymphadenopathy of posterior or anterior cervical chain or axillae bilaterally.  Gait normal with good balance and coordination.  MSK:  Non tender with full range of motion and good stability and symmetric strength and tone of shoulders, elbows, wrist, hip, knee and ankles bilaterally.  Back Exam:  Inspection: Unremarkable  Motion: Flexion 35 deg, Extension 35 deg, Side Bending to 35 deg bilaterally,  Rotation to 35 deg bilaterally  SLR laying: Negative  XSLR laying: Negative  Palpable tenderness: Mild tenderness of the paraspinal musculature but improving FABER: negative. Sensory change: Gross sensation intact to all lumbar and sacral dermatomes.  Reflexes: 2+ at both patellar tendons, 2+ at achilles tendons, Babinski's downgoing.  Strength at foot  Plantar-flexion: 5/5 Dorsi-flexion: 5/5 Eversion: 5/5 Inversion: 5/5  Leg strength  Quad: 5/5 Hamstring: 5/5 Hip flexor: 4/5 Hip abductors: 4/5  Gait unremarkable.  Osteopathic manipulation Cervical C2 flexed rotated and side bent right Thoracic T3 extended rotated and side bent right T4 extended rotated and side bent left Lumbar L2 flexed rotated and side bent right Sacrum Left on left Ileum Left anterior   Impression and Recommendations:     This case required medical decision making of moderate complexity.

## 2014-12-12 NOTE — Assessment & Plan Note (Signed)
Questionable history of spinal stenosis. 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 Patient continues to have some dull aching pain I would consider repeat x-rays.

## 2014-12-12 NOTE — Patient Instructions (Signed)
Good to see you Continue the medicines you are doing Ice is your friend I am so glad you are doing better monitor the back and if we need we can get xrays No changes in my exercises except the [place] Continue with rheumatology.  See me again in 5-6 weeks.

## 2014-12-12 NOTE — Progress Notes (Signed)
Pre visit review using our clinic review tool, if applicable. No additional management support is needed unless otherwise documented below in the visit note. 

## 2014-12-12 NOTE — Assessment & Plan Note (Signed)
Patient seems to be doing somewhat better with her chronic fatigue. Patient has seen rheumatology and there is concern for possible fibromyalgia. Patient has made some different changes and is taking a benzodiazepine at night and she is sleeping much more comfortable he. We discussed icing regimen as well as the home exercises in greater detail. Patient will work on core strength as well. Patient will see me again in 4-5 weeks. Continues respond well to osteopathic manipulation as well. Patient will come back and see me again in 5-6 weeks for further evaluation and treatment.

## 2014-12-12 NOTE — Assessment & Plan Note (Signed)
Decision today to treat with OMT was based on Physical Exam  After verbal consent patient was treated with HVLA, ME techniques in ago, thoracic lumbar and sacral areas  Patient tolerated the procedure well with improvement in symptoms  Patient given exercises, stretches and lifestyle modifications  See medications in patient instructions if given  Patient will follow up in 4-6 weeks

## 2014-12-29 ENCOUNTER — Encounter: Payer: BLUE CROSS/BLUE SHIELD | Admitting: Internal Medicine

## 2015-01-16 ENCOUNTER — Encounter: Payer: Self-pay | Admitting: Family Medicine

## 2015-01-16 ENCOUNTER — Ambulatory Visit (INDEPENDENT_AMBULATORY_CARE_PROVIDER_SITE_OTHER): Payer: BLUE CROSS/BLUE SHIELD | Admitting: Family Medicine

## 2015-01-16 VITALS — BP 102/78 | HR 77 | Ht 66.0 in | Wt 123.0 lb

## 2015-01-16 DIAGNOSIS — G8929 Other chronic pain: Secondary | ICD-10-CM

## 2015-01-16 DIAGNOSIS — M545 Low back pain, unspecified: Secondary | ICD-10-CM

## 2015-01-16 DIAGNOSIS — M999 Biomechanical lesion, unspecified: Secondary | ICD-10-CM

## 2015-01-16 DIAGNOSIS — M9902 Segmental and somatic dysfunction of thoracic region: Secondary | ICD-10-CM | POA: Diagnosis not present

## 2015-01-16 DIAGNOSIS — M9903 Segmental and somatic dysfunction of lumbar region: Secondary | ICD-10-CM | POA: Diagnosis not present

## 2015-01-16 DIAGNOSIS — M9904 Segmental and somatic dysfunction of sacral region: Secondary | ICD-10-CM

## 2015-01-16 NOTE — Assessment & Plan Note (Signed)
Low back pain  Chronic improving, continue vitamins  Continue icing.  We discussed continuing the course strengthening exercises and the hip abductors. Patient come back and see me again in 3-4 weeks.

## 2015-01-16 NOTE — Progress Notes (Signed)
Pre visit review using our clinic review tool, if applicable. No additional management support is needed unless otherwise documented below in the visit note. 

## 2015-01-16 NOTE — Assessment & Plan Note (Signed)
Decision today to treat with OMT was based on Physical Exam  After verbal consent patient was treated with HVLA, ME techniques in ago, thoracic lumbar and sacral areas  Patient tolerated the procedure well with improvement in symptoms  Patient given exercises, stretches and lifestyle modifications  See medications in patient instructions if given  Patient will follow up in 4-6 weeks    

## 2015-01-16 NOTE — Progress Notes (Signed)
  [facility] [place], [place] [address] Phone: [phone] Subjective:    CC: back pain and muscle aches follow up  [id number] Rebecca Marks is a 59 y.o. 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 Patient was seen previously and did have osteopathic manipulation as well as given home exercises, icing protocol, we discussed over-the-counter natural supplementations.  Patient continued to have significant muscle discomfort and we did get labs. Patient labs for autoimmune diseases were unremarkable. Patient has been referred to rheumatology due to these chronic complaints.we've been doing over-the-counter natural supplementation as well as home exercises working on core strength. Patient has responded fairly well to osteopathic manipulation. Patient states overall she is doing better. Patient states that overall she is not having as much the worsening pain. Still some dull aching sensation. Has not been working on his regularly though. Denies any new symptoms. Having a lot of stress in her family though.      Past medical history, social, surgical and family history all reviewed in electronic medical record.   Review of Systems: No headache, visual changes, nausea, vomiting, diarrhea, constipation, dizziness, abdominal pain, skin rash, fevers, chills, night sweats, weight loss, swollen lymph nodes, body aches, joint swelling, muscle aches, chest pain, shortness of breath, mood changes.   Objective Blood pressure 102/78, pulse 77, height 5\' 6"  (1.676 m), weight 123 lb (55.792 kg), SpO2 98 %.  General: No apparent distress alert and oriented x3 mood and affect normal, dressed appropriately.  HEENT: Pupils equal, extraocular movements intact  Respiratory: Patient's  speak in full sentences and does not appear short of breath  Cardiovascular: No lower extremity edema, non tender, no erythema  Skin: Warm dry intact with no signs of infection or rash on extremities or on axial skeleton.  Abdomen: Soft nontender  Neuro: Cranial nerves II through XII are intact, neurovascularly intact in all extremities with 2+ DTRs and 2+ pulses.  Lymph: No lymphadenopathy of posterior or anterior cervical chain or axillae bilaterally.  Gait normal with good balance and coordination.  MSK:  Non tender with full range of motion and good stability and symmetric strength and tone of shoulders, elbows, wrist, hip, knee and ankles bilaterally.  Back Exam:  Inspection: Unremarkable  Motion: Flexion 35 deg, Extension 35 deg, Side Bending to 35 deg bilaterally,  Rotation to 35 deg bilaterally  SLR laying: Negative  XSLR laying: Negative  Palpable tenderness: Mild tenderness of the paraspinal musculature but improving FABER: negative. Sensory change: Gross sensation intact to all lumbar and sacral dermatomes.  Reflexes: 2+ at both patellar tendons, 2+ at achilles tendons, Babinski's downgoing.  Strength at foot  Plantar-flexion: 5/5 Dorsi-flexion: 5/5 Eversion: 5/5 Inversion: 5/5  Leg strength  Quad: 5/5 Hamstring: 5/5 Hip flexor: 4/5 Hip abductors: 4/5  Gait unremarkable.  Osteopathic manipulation Cervical C2 flexed rotated and side bent right Thoracic T3 extended rotated and side bent right T4 extended rotated and side bent left Lumbar L2 flexed rotated and side bent right Sacrum Left on left Ileum Left anterior   Impression and Recommendations:     This case required medical decision making of moderate complexity.

## 2015-01-16 NOTE — Patient Instructions (Signed)
Good to see you You could duct tape the tennis ball Ice is your friend Continue the posture exercises I am sorry on the family stress Double the vitamin D Try cross training with the biking and walking.  Consider compression sleeve on the legs Go shopping! See me again in 5-6 weeks.

## 2015-01-25 ENCOUNTER — Ambulatory Visit (AMBULATORY_SURGERY_CENTER): Payer: Self-pay | Admitting: *Deleted

## 2015-01-25 VITALS — Ht 66.0 in | Wt 124.0 lb

## 2015-01-25 DIAGNOSIS — Z1211 Encounter for screening for malignant neoplasm of colon: Secondary | ICD-10-CM

## 2015-01-25 NOTE — Progress Notes (Signed)
No egg or soy allergy. No anesthesia problems.  No home O2.  No diet meds.  

## 2015-01-26 ENCOUNTER — Encounter: Payer: Self-pay | Admitting: Gastroenterology

## 2015-02-08 ENCOUNTER — Ambulatory Visit (AMBULATORY_SURGERY_CENTER): Payer: BLUE CROSS/BLUE SHIELD | Admitting: Gastroenterology

## 2015-02-08 ENCOUNTER — Encounter: Payer: Self-pay | Admitting: Gastroenterology

## 2015-02-08 VITALS — BP 97/70 | HR 59 | Temp 97.5°F | Resp 27 | Ht 66.0 in | Wt 124.0 lb

## 2015-02-08 DIAGNOSIS — D120 Benign neoplasm of cecum: Secondary | ICD-10-CM | POA: Diagnosis not present

## 2015-02-08 DIAGNOSIS — Z1211 Encounter for screening for malignant neoplasm of colon: Secondary | ICD-10-CM | POA: Diagnosis present

## 2015-02-08 MED ORDER — SODIUM CHLORIDE 0.9 % IV SOLN: 500.0000 mL | INTRAVENOUS | Status: DC

## 2015-02-08 NOTE — Progress Notes (Signed)
Report to PACU, RN, vss, BBS= Clear.  

## 2015-02-08 NOTE — Op Note (Signed)
[place]  [employer] [address]. [place], [address]   COLONOSCOPY PROCEDURE REPORT  PATIENT: Rebecca, Marks  MR#: [id number] BIRTHDATE: Aug 16, 1955 , 13  yrs. old GENDER: female ENDOSCOPIST: Screven Cellar, MD REFERRED BY: Unice Cobble, MD PROCEDURE DATE:  02/08/2015 PROCEDURE:   Colonoscopy with biopsy First Screening Colonoscopy - Avg.  risk and is 50 yrs.  old or older Yes.  Prior Negative Screening - Now for repeat screening. N/A  History of Adenoma - Now for follow-up colonoscopy & has been > or = to 3 yrs.  N/A  Polyps removed today? Yes ASA CLASS:   Class II INDICATIONS:Screening for colonic neoplasia and Colorectal Neoplasm Risk Assessment for this procedure is average risk. MEDICATIONS: Propofol 200 mg IV  DESCRIPTION OF PROCEDURE:   After the risks benefits and alternatives of the procedure were thoroughly explained, informed consent was obtained.  The digital rectal exam revealed no abnormalities of the rectum.   The LB [id number]-H190 [id number]  endoscope was introduced through the anus and advanced to the cecum, which was identified by both the appendix and ileocecal valve. No adverse events experienced.   The quality of the prep was adequate  The instrument was then slowly withdrawn as the colon was fully examined. Estimated blood loss is zero unless otherwise noted in this procedure report.      COLON FINDINGS: Two flat polyps measuring 4 mm in size were found at the cecum.  A polypectomy was performed with cold forceps.  The resection was complete, the polyp tissue was completely retrieved and sent to histology.   There was mild diverticulosis noted in the sigmoid colon.   The examination was otherwise normal.  Retroflexed views revealed no abnormalities. The time to cecum = 4.08 Withdrawal time = 12.02   The scope was withdrawn and the procedure completed. COMPLICATIONS: There were no immediate complications.  ENDOSCOPIC IMPRESSION: 1.   Two  flat polyps were found at the cecum; polypectomy was performed with cold forceps 2.   Mild diverticulosis was noted in the sigmoid colon 3.   The examination was otherwise normal  RECOMMENDATIONS: Resume diet Resume medications Await pathology results.  Further recommendations regarding the timing of your next colonoscopy will be based on these results.  eSigned:  Waverly Cellar, MD 02/08/2015 8:24 AM   cc: Unice Cobble MD, the patient   PATIENT NAME:  Rebecca, Marks MR#: [id number]

## 2015-02-08 NOTE — Progress Notes (Signed)
Called to room to assist during endoscopic procedure.  Patient ID and intended procedure confirmed with present staff. Received instructions for my participation in the procedure from the performing physician.  

## 2015-02-08 NOTE — Progress Notes (Addendum)
0330 patient reporting she had started her prep. She started with small amount of cramping, and she had a "vagal reaction". Stated she layed on the floor with hot "thick sweat'. No chest pain or dyspnea. Reports that this is not the first time it has happened the first time 4 years ago in the air port. Next time in [facility] dining room. Both reactions time with bowel movemnt following episode. Reported to CRNA assigned to patient, [facility] Monday. Patient stating she does want to proceed with the procedure. Patient reports she did not tell her husband related to he is easily upset and is afraid.

## 2015-02-08 NOTE — Patient Instructions (Signed)
Discharge instructions given. Handouts on polyps and diverticulosis. Resume previous medications. YOU HAD AN ENDOSCOPIC PROCEDURE TODAY AT THE [facility]:   Refer to the procedure report that was given to you for any specific questions about what was found during the examination.  If the procedure report does not answer your questions, please call your gastroenterologist to clarify.  If you requested that your care partner not be given the details of your procedure findings, then the procedure report has been included in a sealed envelope for you to review at your convenience later.  YOU SHOULD EXPECT: Some feelings of bloating in the abdomen. Passage of more gas than usual.  Walking can help get rid of the air that was put into your GI tract during the procedure and reduce the bloating. If you had a lower endoscopy (such as a colonoscopy or flexible sigmoidoscopy) you may notice spotting of blood in your stool or on the toilet paper. If you underwent a bowel prep for your procedure, you may not have a normal bowel movement for a few days.  Please Note:  You might notice some irritation and congestion in your nose or some drainage.  This is from the oxygen used during your procedure.  There is no need for concern and it should clear up in a day or so.  SYMPTOMS TO REPORT IMMEDIATELY:   Following lower endoscopy (colonoscopy or flexible sigmoidoscopy):  Excessive amounts of blood in the stool  Significant tenderness or worsening of abdominal pains  Swelling of the abdomen that is new, acute  Fever of 100F or higher   For urgent or emergent issues, a gastroenterologist can be reached at any hour by calling [phone].   DIET: Your first meal following the procedure should be a small meal and then it is ok to progress to your normal diet. Heavy or fried foods are harder to digest and may make you feel nauseous or bloated.  Likewise, meals heavy in dairy and vegetables can  increase bloating.  Drink plenty of fluids but you should avoid alcoholic beverages for 24 hours.  ACTIVITY:  You should plan to take it easy for the rest of today and you should NOT DRIVE or use heavy machinery until tomorrow (because of the sedation medicines used during the test).    FOLLOW UP: Our staff will call the number listed on your records the next business day following your procedure to check on you and address any questions or concerns that you may have regarding the information given to you following your procedure. If we do not reach you, we will leave a message.  However, if you are feeling well and you are not experiencing any problems, there is no need to return our call.  We will assume that you have returned to your regular daily activities without incident.  If any biopsies were taken you will be contacted by phone or by letter within the next 1-3 weeks.  Please call us at [phone] if you have not heard about the biopsies in 3 weeks.    SIGNATURES/CONFIDENTIALITY: You and/or your care partner have signed paperwork which will be entered into your electronic medical record.  These signatures attest to the fact that that the information above on your After Visit Summary has been reviewed and is understood.  Full responsibility of the confidentiality of this discharge information lies with you and/or your care-partner. 

## 2015-02-09 ENCOUNTER — Telehealth: Payer: Self-pay | Admitting: *Deleted

## 2015-02-09 NOTE — Telephone Encounter (Signed)
No answer, message left for the patient. 

## 2015-02-13 ENCOUNTER — Encounter: Payer: Self-pay | Admitting: Gastroenterology

## 2015-02-18 ENCOUNTER — Encounter: Payer: Self-pay | Admitting: Physician Assistant

## 2015-02-18 DIAGNOSIS — I491 Atrial premature depolarization: Secondary | ICD-10-CM | POA: Insufficient documentation

## 2015-02-18 NOTE — Progress Notes (Signed)
Cardiology Office Note Date:  02/19/2015  Patient ID:  Rebecca Marks, DOB Dec 23, 1955, MRN [id number] PCP:  Unice Cobble, MD  Cardiologist:  Aundra Dubin  Chief Complaint: f/u dyspnea  History of Present Illness: Rebecca Marks is a 59 y.o. female with history of chronic back pain (previous autoimmune w/u unrevealing per rehab notes), OA, Raynaud's phenomenon, HCV (spontaneously cleared per notes), h/o DVT in her 81s while on OCPs, PNA (metapneumovirus) in 4/15, PFTs (7/15) with minimal obstructive airways disease, prior tobacco abuse who presents for f/u. She has history of volume overload during hospital stay for PNA in 08/2013 felt iatrogenic due to receiving a lot of IV fluid. 2D echo 08/2013 showed EF 09-47%, normal diastolic function parameters. Holter 11/2013 done for rare palpitations showed NSR with few PACs.  She is here to re-establish care with cardiology. She says she recently decided given her family history of CAD that she wanted to line up having specialists in place if needed. She now sees dermatology and GI as well for preventative maintenance. She has no history of HTN. BP is well controlled. Lipids and glucose are followed by PCP and she denies a history of HLD or DM. Her father had 3 MIs, first occurring in his 57s - passed away after 3rd MI. She endorses chronic dyspnea that occurs with strenuous exertion (i.e. briskly walking up 2 flights of stairs). She denies chest pain or other cardiac symptoms. She has been under a lot of family stress lately - mother-in-law is in a living facility and her sister-in-laws from out-of-town have created increased stress around this situation.   Past Medical History  Diagnosis Date  . Hepatitis C     contaminated blood @ her job, prior notes indicate spontaneously cleared  . Ruptured lumbar disc     4 herniated disc   . Arthritis     R wrist and R knee  . Allergy   . CAP (community acquired pneumonia) 4/17-22/15    Rochephin & IV  Zithromax & 3 days Levaquin  . Anxiety   . DVT (deep venous thrombosis) ([place])     a. in her 20s while on OCPs.  . Palpitations   . Osteoarthritis   . Chronic back pain   . Raynaud phenomenon   . Pulmonary function study abnormality     a. 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  [name] History of tobacco abuse   . Volume overload     a.  history of volume overload during hospital stay for PNA in 08/2013 felt iatrogenic due to receiving a lot of IV fluid. 2D echo 08/2013 showed EF 09-62%, normal diastolic function parameters.  . Premature atrial contractions     a. Holter 11/2013: few PACs.    Past Surgical History  Procedure Laterality Date  . Cholecystectomy  1985    Current Outpatient Prescriptions  Medication Sig Dispense Refill  . ALPRAZolam (XANAX) 0.5 MG tablet Take 0.5 mg by mouth at bedtime as needed for anxiety or sleep.   1  . Ascorbic Acid (VITAMIN C) 1000 MG tablet Take 2,000 mg by mouth 2 (two) times daily.     . Biotin 5000 MCG CAPS Take 1 capsule by mouth daily.    . Cholecalciferol (VITAMIN D3) 2000 UNITS TABS Take 2 tablets by mouth daily.    . Coenzyme Q10 200 MG capsule Take 200 mg by mouth daily.    [name] CORAL CALCIUM PO Take by mouth. 1000mg -3000mg  daily.    . cyclobenzaprine (FLEXERIL) 10  MG tablet Take 7.5 mg by mouth at bedtime.    . Glucosamine-Chondroitin (GLUCOSAMINE CHONDR COMPLEX PO) Take 1 capsule by mouth daily.    [name] ibuprofen (ADVIL,MOTRIN) 200 MG tablet Take 800 mg by mouth 4 (four) times daily.    . Misc Natural Products (TART CHERRY ADVANCED) CAPS Take 100 capsules by mouth daily.    . Omega-3 Fatty Acids (ULTRA OMEGA 3 PO) Take by mouth. 1-2 grams daily.    [name] OVER THE COUNTER MEDICATION Take 1 capsule by mouth daily. Ginger 300mg /Turmeric 300mg     . PHOSPHATIDYLSERINE PO Take 300 mg by mouth daily.     . VOLTAREN 1 % GEL Apply 1 application topically at bedtime. Applied to knee, hand, fingers.     No current facility-administered  medications for this visit.    Allergies:   Dextromethorphan   Social History:  The patient  reports that she quit smoking about 18 months ago. Her smoking use included Cigarettes. She has a 6.25 pack-year smoking history. She has never used smokeless tobacco. She reports that she does not drink alcohol or use illicit drugs.   Family History:  The patient's family history includes Alzheimer's disease in her maternal grandmother; Breast cancer in her mother and sister; Diabetes in her father; Heart attack in her father; Hypertension in her father; Lymphoma in her father; Stroke in her maternal grandfather; Stroke (age of onset: 41) in her mother. There is no history of Colon cancer.  ROS:  Please see the history of present illness.  All other systems are reviewed and otherwise negative.   PHYSICAL EXAM:  VS:  BP 110/72 mmHg  Pulse 73  Ht 5\' 6"  (1.676 m)  Wt 126 lb 6.4 oz (57.335 kg)  BMI 20.41 kg/m2 BMI: Body mass index is 20.41 kg/(m^2). Well nourished, well developed thin WF, in no acute distress HEENT: normocephalic, atraumatic Neck: no JVD, carotid bruits or masses Cardiac:  normal S1, S2; RRR; no murmurs, rubs, or gallops Lungs:  clear to auscultation bilaterally, no wheezing, rhonchi or rales Abd: soft, nontender, no hepatomegaly, + BS MS: no deformity or atrophy Ext: no edema Skin: warm and dry, no rash Neuro:  moves all extremities spontaneously, no focal abnormalities noted, follows commands Psych: euthymic mood, full affect   EKG:  Done today shows NSR 73bpm, nonspeciifc TWI aVL, biphasic T in V2, otherwise nonacute  Recent Labs: 11/16/2014: ALT 5; BUN 20; Creatinine, Ser 0.89; Hemoglobin 14.9; Magnesium 2.1; Platelets 227.0; Potassium 4.1; Sodium 139 11/21/2014: TSH 2.79  No results found for requested labs within last 365 days.   CrCl cannot be calculated (Patient has no serum creatinine result on file.).   Wt Readings from Last 3 Encounters:  02/19/15 126 lb 6.4 oz  (57.335 kg)  02/08/15 124 lb (56.246 kg)  01/25/15 124 lb (56.246 kg)     Other studies reviewed: Additional studies/records reviewed today include: summarized above  ASSESSMENT AND PLAN:  1. H/o dyspnea - stable, chronic. She is interested in pursuing further cardiac workup given her family history of CAD. EKG with mild nonspecific changes. It is not unreasonable to obtain a baseline - will proceed with ETT to risk stratify. 2. H/o volume overload 08/2013 felt 2/2 iatrogenic IVF administration - appears euvolemic. Echocardiogram in 2015 showed normal EF and normal diastolic function. 3. Premature atrial contractions - generally asymptomatic with this.  Disposition: F/u with Dr. Aundra Dubin in 1 year.  Current medicines are reviewed at length with the patient today.  The patient did  not have any concerns regarding medicines.  Raechel Ache PA-C 02/19/2015 12:03 PM     [address] [place] [address] [phone] (office)  [phone] (fax)

## 2015-02-19 ENCOUNTER — Ambulatory Visit (INDEPENDENT_AMBULATORY_CARE_PROVIDER_SITE_OTHER): Payer: BLUE CROSS/BLUE SHIELD | Admitting: Physician Assistant

## 2015-02-19 ENCOUNTER — Encounter: Payer: Self-pay | Admitting: Physician Assistant

## 2015-02-19 VITALS — BP 110/72 | HR 73 | Ht 66.0 in | Wt 126.4 lb

## 2015-02-19 DIAGNOSIS — R002 Palpitations: Secondary | ICD-10-CM

## 2015-02-19 DIAGNOSIS — R0600 Dyspnea, unspecified: Secondary | ICD-10-CM | POA: Diagnosis not present

## 2015-02-19 DIAGNOSIS — I491 Atrial premature depolarization: Secondary | ICD-10-CM

## 2015-02-19 DIAGNOSIS — E876 Hypokalemia: Secondary | ICD-10-CM | POA: Diagnosis not present

## 2015-02-19 DIAGNOSIS — E8770 Fluid overload, unspecified: Secondary | ICD-10-CM | POA: Diagnosis not present

## 2015-02-19 NOTE — Patient Instructions (Addendum)
Medication Instructions:  Your physician recommends that you continue on your current medications as directed. Please refer to the Current Medication list given to you today.   Labwork: None ordered  Testing/Procedures: Your physician wants you to have a ETT (Exercise Tolerance Test).   Follow-Up: Your physician wants you to follow-up in: [place] Aundra Dubin.  You will receive a reminder letter in the mail two months in advance. If you don't receive a letter, please call our office to schedule the follow-up appointment.

## 2015-02-21 ENCOUNTER — Telehealth (HOSPITAL_COMMUNITY): Payer: Self-pay

## 2015-02-21 NOTE — Telephone Encounter (Signed)
Encounter complete. 

## 2015-02-23 ENCOUNTER — Ambulatory Visit (HOSPITAL_COMMUNITY)
Admission: RE | Admit: 2015-02-23 | Discharge: 2015-02-23 | Disposition: A | Discharge status: Home / Self Care | Payer: BLUE CROSS/BLUE SHIELD | Source: Ambulatory Visit | Attending: Physician Assistant | Admitting: Physician Assistant

## 2015-02-23 ENCOUNTER — Ambulatory Visit: Payer: BLUE CROSS/BLUE SHIELD | Admitting: Cardiology

## 2015-02-23 DIAGNOSIS — R0600 Dyspnea, unspecified: Secondary | ICD-10-CM | POA: Diagnosis not present

## 2015-02-23 LAB — EXERCISE TOLERANCE TEST
CHL RATE OF PERCEIVED EXERTION: 16
CSEPED: 8 min
CSEPEW: 10.1 METS
CSEPPHR: 142 {beats}/min
Exercise duration (sec): 1 s
MPHR: 162 {beats}/min
Percent HR: 87 %
Rest HR: 75 {beats}/min

## 2015-02-28 ENCOUNTER — Ambulatory Visit: Payer: BLUE CROSS/BLUE SHIELD | Admitting: Family Medicine

## 2015-02-28 LAB — HM PAP SMEAR

## 2015-03-21 LAB — HM DEXA SCAN

## 2015-05-01 ENCOUNTER — Telehealth: Payer: Self-pay

## 2015-05-01 NOTE — Telephone Encounter (Signed)
Left Voice Mail for pt to call back.   RE: Flu Vaccine for 2016  

## 2015-05-02 ENCOUNTER — Ambulatory Visit (INDEPENDENT_AMBULATORY_CARE_PROVIDER_SITE_OTHER): Payer: BLUE CROSS/BLUE SHIELD

## 2015-05-02 DIAGNOSIS — Z23 Encounter for immunization: Secondary | ICD-10-CM

## 2015-08-17 ENCOUNTER — Telehealth: Payer: Self-pay | Admitting: Internal Medicine

## 2015-08-17 NOTE — Telephone Encounter (Signed)
Is requesting Dr. Ronnald Ramp to take her on as a patient.  Former [employer] manager.

## 2015-08-18 ENCOUNTER — Encounter: Payer: Self-pay | Admitting: Family Medicine

## 2015-08-18 ENCOUNTER — Ambulatory Visit (INDEPENDENT_AMBULATORY_CARE_PROVIDER_SITE_OTHER): Payer: BLUE CROSS/BLUE SHIELD | Admitting: Family Medicine

## 2015-08-18 VITALS — BP 110/78 | HR 84 | Temp 97.9°F | Wt 128.0 lb

## 2015-08-18 DIAGNOSIS — J069 Acute upper respiratory infection, unspecified: Secondary | ICD-10-CM | POA: Insufficient documentation

## 2015-08-18 MED ORDER — AMOXICILLIN-POT CLAVULANATE 875-125 MG PO TABS: 1.0000 | ORAL_TABLET | Freq: Two times a day (BID) | ORAL | Status: DC

## 2015-08-18 MED ORDER — FLUCONAZOLE 150 MG PO TABS: 150.0000 mg | ORAL_TABLET | ORAL | Status: DC

## 2015-08-18 NOTE — Progress Notes (Signed)
  Tommi Rumps, MD Phone: [phone]  Rebecca Marks is a 60 y.o. female who presents today for same-day visit.  Patient notes 2 weeks of sinus congestion and mildly productive cough. Notes some postnasal drip and rhinorrhea with this. She is blowing clearish mucus out of her nose. No fevers. No shortness of breath. She is using a Nettie pot and this has benefited her significantly. Over the last several days she has started to get better. She does note sick contacts.  PMH: Former smoker   ROS see history of present illness  Objective  Physical Exam Filed Vitals:   08/18/15 0932  BP: 110/78  Pulse: 84  Temp: 97.9 F (36.6 C)    BP Readings from Last 3 Encounters:  08/18/15 110/78  02/19/15 110/72  02/08/15 97/70   Wt Readings from Last 3 Encounters:  08/18/15 128 lb (58.06 kg)  02/19/15 126 lb 6.4 oz (57.335 kg)  02/08/15 124 lb (56.246 kg)    Physical Exam  Constitutional: She is well-developed, well-nourished, and in no distress.  HENT:  Head: Normocephalic and atraumatic.  Right Ear: External ear normal.  Left Ear: External ear normal.  Mouth/Throat: Oropharynx is clear and moist. No oropharyngeal exudate.  Normal TMs bilaterally, no tenderness to percussion of sinuses  Eyes: Conjunctivae are normal. Pupils are equal, round, and reactive to light.  Neck: Neck supple.  Cardiovascular: Normal rate, regular rhythm and normal heart sounds.  Exam reveals no gallop and no friction rub.   No murmur heard. Pulmonary/Chest: Effort normal and breath sounds normal. No respiratory distress. She has no wheezes. She has no rales.  Lymphadenopathy:    She has no cervical adenopathy.  Neurological: She is alert. Gait normal.  Skin: Skin is warm and dry. She is not diaphoretic.     Assessment/Plan: Please see individual problem list.  Acute upper respiratory infection Suspect viral upper respiratory infection as cause given recent improvement. Given duration could  be consistent with bacterial illness though would not expect that to improve without other intervention. Discussed this with the patient. Benign lung exam. Stable vital signs. Discussed providing the patient with an antibiotic prescription to take if she does not continue to improve over the next several days. She is only to fill this if she does not improve. If she worsens she will seek medical attention. If she fills the antibiotic she will also fill the Diflucan as she is prone to yeast infections with antibiotics. She will also take a probiotic if she is on the antibiotic. Given return precautions.    No orders of the defined types were placed in this encounter.    Meds ordered this encounter  Medications  . amoxicillin-clavulanate (AUGMENTIN) 875-125 MG tablet    Sig: Take 1 tablet by mouth 2 (two) times daily.    Dispense:  14 tablet    Refill:  0  . fluconazole (DIFLUCAN) 150 MG tablet    Sig: Take 1 tablet (150 mg total) by mouth every 3 (three) days.    Dispense:  2 tablet    Refill:  0    Tommi Rumps, MD [place]

## 2015-08-18 NOTE — Progress Notes (Signed)
Pre visit review using our clinic review tool, if applicable. No additional management support is needed unless otherwise documented below in the visit note. 

## 2015-08-18 NOTE — Patient Instructions (Signed)
Nice to meet you. You likely have a sinus infection. This could be viral or bacterial given your improvement.  Please continue flonase, claritin and the netti pot. We will provide you with an antibiotic to take if you do not continue to improve. Please fill this only if your symptoms do not improve.  If you develop chest pain, shortness of breath, cough productive of blood, fevers, or any new or change in symptoms please seek medical attention prior to filling the antibiotic.

## 2015-08-18 NOTE — Assessment & Plan Note (Signed)
Suspect viral upper respiratory infection as cause given recent improvement. Given duration could be consistent with bacterial illness though would not expect that to improve without other intervention. Discussed this with the patient. Benign lung exam. Stable vital signs. Discussed providing the patient with an antibiotic prescription to take if she does not continue to improve over the next several days. She is only to fill this if she does not improve. If she worsens she will seek medical attention. If she fills the antibiotic she will also fill the Diflucan as she is prone to yeast infections with antibiotics. She will also take a probiotic if she is on the antibiotic. Given return precautions.

## 2015-08-20 NOTE — Telephone Encounter (Signed)
OK by me 

## 2015-08-21 DIAGNOSIS — F4323 Adjustment disorder with mixed anxiety and depressed mood: Secondary | ICD-10-CM | POA: Diagnosis not present

## 2015-08-21 NOTE — Telephone Encounter (Signed)
Left message to call back to make appt.

## 2015-08-30 ENCOUNTER — Ambulatory Visit: Payer: BLUE CROSS/BLUE SHIELD | Admitting: Family

## 2015-08-30 DIAGNOSIS — G44209 Tension-type headache, unspecified, not intractable: Secondary | ICD-10-CM | POA: Diagnosis not present

## 2015-09-04 ENCOUNTER — Telehealth: Payer: Self-pay | Admitting: Cardiology

## 2015-09-05 DIAGNOSIS — F4323 Adjustment disorder with mixed anxiety and depressed mood: Secondary | ICD-10-CM | POA: Diagnosis not present

## 2015-09-11 ENCOUNTER — Ambulatory Visit (INDEPENDENT_AMBULATORY_CARE_PROVIDER_SITE_OTHER): Payer: BLUE CROSS/BLUE SHIELD | Admitting: Internal Medicine

## 2015-09-11 ENCOUNTER — Encounter: Payer: Self-pay | Admitting: Internal Medicine

## 2015-09-11 VITALS — BP 106/72 | HR 87 | Temp 97.8°F | Resp 16 | Ht 66.0 in | Wt 128.0 lb

## 2015-09-11 DIAGNOSIS — Z0001 Encounter for general adult medical examination with abnormal findings: Secondary | ICD-10-CM | POA: Diagnosis not present

## 2015-09-11 DIAGNOSIS — J301 Allergic rhinitis due to pollen: Secondary | ICD-10-CM | POA: Diagnosis not present

## 2015-09-11 DIAGNOSIS — Z23 Encounter for immunization: Secondary | ICD-10-CM

## 2015-09-11 DIAGNOSIS — B182 Chronic viral hepatitis C: Secondary | ICD-10-CM | POA: Diagnosis not present

## 2015-09-11 DIAGNOSIS — Z0000 Encounter for general adult medical examination without abnormal findings: Secondary | ICD-10-CM | POA: Insufficient documentation

## 2015-09-11 DIAGNOSIS — E559 Vitamin D deficiency, unspecified: Secondary | ICD-10-CM | POA: Insufficient documentation

## 2015-09-11 NOTE — Patient Instructions (Signed)
Preventive Care for Adults, Female A healthy lifestyle and preventive care can promote health and wellness. Preventive health guidelines for women include the following key practices.  A routine yearly physical is a good way to check with your health care provider about your health and preventive screening. It is a chance to share any concerns and updates on your health and to receive a thorough exam.  Visit your dentist for a routine exam and preventive care every 6 months. Brush your teeth twice a day and floss once a day. Good oral hygiene prevents tooth decay and gum disease.  The frequency of eye exams is based on your age, health, family medical history, use of contact lenses, and other factors. Follow your health care provider's recommendations for frequency of eye exams.  Eat a healthy diet. Foods like vegetables, fruits, whole grains, low-fat dairy products, and lean protein foods contain the nutrients you need without too many calories. Decrease your intake of foods high in solid fats, added sugars, and salt. Eat the right amount of calories for you.Get information about a proper diet from your health care provider, if necessary.  Regular physical exercise is one of the most important things you can do for your health. Most adults should get at least 150 minutes of moderate-intensity exercise (any activity that increases your heart rate and causes you to sweat) each week. In addition, most adults need muscle-strengthening exercises on 2 or more days a week.  Maintain a healthy weight. The body mass index (BMI) is a screening tool to identify possible weight problems. It provides an estimate of body fat based on height and weight. Your health care provider can find your BMI and can help you achieve or maintain a healthy weight.For adults 20 years and older:  A BMI below 18.5 is considered underweight.  A BMI of 18.5 to 24.9 is normal.  A BMI of 25 to 29.9 is considered overweight.  A  BMI of 30 and above is considered obese.  Maintain normal blood lipids and cholesterol levels by exercising and minimizing your intake of saturated fat. Eat a balanced diet with plenty of fruit and vegetables. Blood tests for lipids and cholesterol should begin at age 45 and be repeated every 5 years. If your lipid or cholesterol levels are high, you are over 50, or you are at high risk for heart disease, you may need your cholesterol levels checked more frequently.Ongoing high lipid and cholesterol levels should be treated with medicines if diet and exercise are not working.  If you smoke, find out from your health care provider how to quit. If you do not use tobacco, do not start.  Lung cancer screening is recommended for adults aged 45-80 years who are at high risk for developing lung cancer because of a history of smoking. A yearly low-dose CT scan of the lungs is recommended for people who have at least a 30-pack-year history of smoking and are a current smoker or have quit within the past 15 years. A pack year of smoking is smoking an average of 1 pack of cigarettes a day for 1 year (for example: 1 pack a day for 30 years or 2 packs a day for 15 years). Yearly screening should continue until the smoker has stopped smoking for at least 15 years. Yearly screening should be stopped for people who develop a health problem that would prevent them from having lung cancer treatment.  If you are pregnant, do not drink alcohol. If you are  breastfeeding, be very cautious about drinking alcohol. If you are not pregnant and choose to drink alcohol, do not have more than 1 drink per day. One drink is considered to be 12 ounces (355 mL) of beer, 5 ounces (148 mL) of wine, or 1.5 ounces (44 mL) of liquor.  Avoid use of street drugs. Do not share needles with anyone. Ask for help if you need support or instructions about stopping the use of drugs.  High blood pressure causes heart disease and increases the risk  of stroke. Your blood pressure should be checked at least every 1 to 2 years. Ongoing high blood pressure should be treated with medicines if weight loss and exercise do not work.  If you are 55-79 years old, ask your health care provider if you should take aspirin to prevent strokes.  Diabetes screening is done by taking a blood sample to check your blood glucose level after you have not eaten for a certain period of time (fasting). If you are not overweight and you do not have risk factors for diabetes, you should be screened once every 3 years starting at age 45. If you are overweight or obese and you are 40-70 years of age, you should be screened for diabetes every year as part of your cardiovascular risk assessment.  Breast cancer screening is essential preventive care for women. You should practice "breast self-awareness." This means understanding the normal appearance and feel of your breasts and may include breast self-examination. Any changes detected, no matter how small, should be reported to a health care provider. Women in their 20s and 30s should have a clinical breast exam (CBE) by a health care provider as part of a regular health exam every 1 to 3 years. After age 40, women should have a CBE every year. Starting at age 40, women should consider having a mammogram (breast X-ray test) every year. Women who have a family history of breast cancer should talk to their health care provider about genetic screening. Women at a high risk of breast cancer should talk to their health care providers about having an MRI and a mammogram every year.  Breast cancer gene (BRCA)-related cancer risk assessment is recommended for women who have family members with BRCA-related cancers. BRCA-related cancers include breast, ovarian, tubal, and peritoneal cancers. Having family members with these cancers may be associated with an increased risk for harmful changes (mutations) in the breast cancer genes BRCA1 and  BRCA2. Results of the assessment will determine the need for genetic counseling and BRCA1 and BRCA2 testing.  Your health care provider may recommend that you be screened regularly for cancer of the pelvic organs (ovaries, uterus, and vagina). This screening involves a pelvic examination, including checking for microscopic changes to the surface of your cervix (Pap test). You may be encouraged to have this screening done every 3 years, beginning at age 21.  For women ages 30-65, health care providers may recommend pelvic exams and Pap testing every 3 years, or they may recommend the Pap and pelvic exam, combined with testing for human papilloma virus (HPV), every 5 years. Some types of HPV increase your risk of cervical cancer. Testing for HPV may also be done on women of any age with unclear Pap test results.  Other health care providers may not recommend any screening for nonpregnant women who are considered low risk for pelvic cancer and who do not have symptoms. Ask your health care provider if a screening pelvic exam is right for   you.  If you have had past treatment for cervical cancer or a condition that could lead to cancer, you need Pap tests and screening for cancer for at least 20 years after your treatment. If Pap tests have been discontinued, your risk factors (such as having a new sexual partner) need to be reassessed to determine if screening should resume. Some women have medical problems that increase the chance of getting cervical cancer. In these cases, your health care provider may recommend more frequent screening and Pap tests.  Colorectal cancer can be detected and often prevented. Most routine colorectal cancer screening begins at the age of 50 years and continues through age 75 years. However, your health care provider may recommend screening at an earlier age if you have risk factors for colon cancer. On a yearly basis, your health care provider may provide home test kits to check  for hidden blood in the stool. Use of a small camera at the end of a tube, to directly examine the colon (sigmoidoscopy or colonoscopy), can detect the earliest forms of colorectal cancer. Talk to your health care provider about this at age 50, when routine screening begins. Direct exam of the colon should be repeated every 5-10 years through age 75 years, unless early forms of precancerous polyps or small growths are found.  People who are at an increased risk for hepatitis B should be screened for this virus. You are considered at high risk for hepatitis B if:  You were born in a country where hepatitis B occurs often. Talk with your health care provider about which countries are considered high risk.  Your parents were born in a high-risk country and you have not received a shot to protect against hepatitis B (hepatitis B vaccine).  You have HIV or AIDS.  You use needles to inject street drugs.  You live with, or have sex with, someone who has hepatitis B.  You get hemodialysis treatment.  You take certain medicines for conditions like cancer, organ transplantation, and autoimmune conditions.  Hepatitis C blood testing is recommended for all people born from 1945 through 1965 and any individual with known risks for hepatitis C.  Practice safe sex. Use condoms and avoid high-risk sexual practices to reduce the spread of sexually transmitted infections (STIs). STIs include gonorrhea, chlamydia, syphilis, trichomonas, herpes, HPV, and human immunodeficiency virus (HIV). Herpes, HIV, and HPV are viral illnesses that have no cure. They can result in disability, cancer, and death.  You should be screened for sexually transmitted illnesses (STIs) including gonorrhea and chlamydia if:  You are sexually active and are younger than 24 years.  You are older than 24 years and your health care provider tells you that you are at risk for this type of infection.  Your sexual activity has changed  since you were last screened and you are at an increased risk for chlamydia or gonorrhea. Ask your health care provider if you are at risk.  If you are at risk of being infected with HIV, it is recommended that you take a prescription medicine daily to prevent HIV infection. This is called preexposure prophylaxis (PrEP). You are considered at risk if:  You are sexually active and do not regularly use condoms or know the HIV status of your partner(s).  You take drugs by injection.  You are sexually active with a partner who has HIV.  Talk with your health care provider about whether you are at high risk of being infected with HIV. If   you choose to begin PrEP, you should first be tested for HIV. You should then be tested every 3 months for as long as you are taking PrEP.  Osteoporosis is a disease in which the bones lose minerals and strength with aging. This can result in serious bone fractures or breaks. The risk of osteoporosis can be identified using a bone density scan. Women ages 67 years and over and women at risk for fractures or osteoporosis should discuss screening with their health care providers. Ask your health care provider whether you should take a calcium supplement or vitamin D to reduce the rate of osteoporosis.  Menopause can be associated with physical symptoms and risks. Hormone replacement therapy is available to decrease symptoms and risks. You should talk to your health care provider about whether hormone replacement therapy is right for you.  Use sunscreen. Apply sunscreen liberally and repeatedly throughout the day. You should seek shade when your shadow is shorter than you. Protect yourself by wearing long sleeves, pants, a wide-brimmed hat, and sunglasses year round, whenever you are outdoors.  Once a month, do a whole body skin exam, using a mirror to look at the skin on your back. Tell your health care provider of new moles, moles that have irregular borders, moles that  are larger than a pencil eraser, or moles that have changed in shape or color.  Stay current with required vaccines (immunizations).  Influenza vaccine. All adults should be immunized every year.  Tetanus, diphtheria, and acellular pertussis (Td, Tdap) vaccine. Pregnant women should receive 1 dose of Tdap vaccine during each pregnancy. The dose should be obtained regardless of the length of time since the last dose. Immunization is preferred during the 27th-36th week of gestation. An adult who has not previously received Tdap or who does not know her vaccine status should receive 1 dose of Tdap. This initial dose should be followed by tetanus and diphtheria toxoids (Td) booster doses every 10 years. Adults with an unknown or incomplete history of completing a 3-dose immunization series with Td-containing vaccines should begin or complete a primary immunization series including a Tdap dose. Adults should receive a Td booster every 10 years.  Varicella vaccine. An adult without evidence of immunity to varicella should receive 2 doses or a second dose if she has previously received 1 dose. Pregnant females who do not have evidence of immunity should receive the first dose after pregnancy. This first dose should be obtained before leaving the health care facility. The second dose should be obtained 4-8 weeks after the first dose.  Human papillomavirus (HPV) vaccine. Females aged 13-26 years who have not received the vaccine previously should obtain the 3-dose series. The vaccine is not recommended for use in pregnant females. However, pregnancy testing is not needed before receiving a dose. If a female is found to be pregnant after receiving a dose, no treatment is needed. In that case, the remaining doses should be delayed until after the pregnancy. Immunization is recommended for any person with an immunocompromised condition through the age of 61 years if she did not get any or all doses earlier. During the  3-dose series, the second dose should be obtained 4-8 weeks after the first dose. The third dose should be obtained 24 weeks after the first dose and 16 weeks after the second dose.  Zoster vaccine. One dose is recommended for adults aged 30 years or older unless certain conditions are present.  Measles, mumps, and rubella (MMR) vaccine. Adults born  before 1957 generally are considered immune to measles and mumps. Adults born in 1957 or later should have 1 or more doses of MMR vaccine unless there is a contraindication to the vaccine or there is laboratory evidence of immunity to each of the three diseases. A routine second dose of MMR vaccine should be obtained at least 28 days after the first dose for students attending postsecondary schools, health care workers, or international travelers. People who received inactivated measles vaccine or an unknown type of measles vaccine during 1963-1967 should receive 2 doses of MMR vaccine. People who received inactivated mumps vaccine or an unknown type of mumps vaccine before 1979 and are at high risk for mumps infection should consider immunization with 2 doses of MMR vaccine. For females of childbearing age, rubella immunity should be determined. If there is no evidence of immunity, females who are not pregnant should be vaccinated. If there is no evidence of immunity, females who are pregnant should delay immunization until after pregnancy. Unvaccinated health care workers born before 1957 who lack laboratory evidence of measles, mumps, or rubella immunity or laboratory confirmation of disease should consider measles and mumps immunization with 2 doses of MMR vaccine or rubella immunization with 1 dose of MMR vaccine.  Pneumococcal 13-valent conjugate (PCV13) vaccine. When indicated, a person who is uncertain of his immunization history and has no record of immunization should receive the PCV13 vaccine. All adults 65 years of age and older should receive this  vaccine. An adult aged 19 years or older who has certain medical conditions and has not been previously immunized should receive 1 dose of PCV13 vaccine. This PCV13 should be followed with a dose of pneumococcal polysaccharide (PPSV23) vaccine. Adults who are at high risk for pneumococcal disease should obtain the PPSV23 vaccine at least 8 weeks after the dose of PCV13 vaccine. Adults older than 59 years of age who have normal immune system function should obtain the PPSV23 vaccine dose at least 1 year after the dose of PCV13 vaccine.  Pneumococcal polysaccharide (PPSV23) vaccine. When PCV13 is also indicated, PCV13 should be obtained first. All adults aged 65 years and older should be immunized. An adult younger than age 65 years who has certain medical conditions should be immunized. Any person who resides in a nursing home or long-term care facility should be immunized. An adult smoker should be immunized. People with an immunocompromised condition and certain other conditions should receive both PCV13 and PPSV23 vaccines. People with human immunodeficiency virus (HIV) infection should be immunized as soon as possible after diagnosis. Immunization during chemotherapy or radiation therapy should be avoided. Routine use of PPSV23 vaccine is not recommended for American Indians, [place] Natives, or people younger than 65 years unless there are medical conditions that require PPSV23 vaccine. When indicated, people who have unknown immunization and have no record of immunization should receive PPSV23 vaccine. One-time revaccination 5 years after the first dose of PPSV23 is recommended for people aged 19-64 years who have chronic kidney failure, nephrotic syndrome, asplenia, or immunocompromised conditions. People who received 1-2 doses of PPSV23 before age 65 years should receive another dose of PPSV23 vaccine at age 65 years or later if at least 5 years have passed since the previous dose. Doses of PPSV23 are not  needed for people immunized with PPSV23 at or after age 65 years.  Meningococcal vaccine. Adults with asplenia or persistent complement component deficiencies should receive 2 doses of quadrivalent meningococcal conjugate (MenACWY-D) vaccine. The doses should be obtained   at least 2 months apart. Microbiologists working with certain meningococcal bacteria, [place] recruits, people at risk during an outbreak, and people who travel to or live in countries with a high rate of meningitis should be immunized. A first-year college student up through age 34 years who is living in a residence hall should receive a dose if she did not receive a dose on or after her 16th birthday. Adults who have certain high-risk conditions should receive one or more doses of vaccine.  Hepatitis A vaccine. Adults who wish to be protected from this disease, have certain high-risk conditions, work with hepatitis A-infected animals, work in hepatitis A research labs, or travel to or work in countries with a high rate of hepatitis A should be immunized. Adults who were previously unvaccinated and who anticipate close contact with an international adoptee during the first 60 days after arrival in the [place] from a country with a high rate of hepatitis A should be immunized.  Hepatitis B vaccine. Adults who wish to be protected from this disease, have certain high-risk conditions, may be exposed to blood or other infectious body fluids, are household contacts or sex partners of hepatitis B positive people, are clients or workers in certain care facilities, or travel to or work in countries with a high rate of hepatitis B should be immunized.  Haemophilus influenzae type b (Hib) vaccine. A previously unvaccinated person with asplenia or sickle cell disease or having a scheduled splenectomy should receive 1 dose of Hib vaccine. Regardless of previous immunization, a recipient of a hematopoietic stem cell transplant should receive a  3-dose series 6-12 months after her successful transplant. Hib vaccine is not recommended for adults with HIV infection. Preventive Services / Frequency Ages 35 to 4 years  Blood pressure check.** / Every 3-5 years.  Lipid and cholesterol check.** / Every 5 years beginning at age 60.  Clinical breast exam.** / Every 3 years for women in their 71s and 10s.  BRCA-related cancer risk assessment.** / For women who have family members with a BRCA-related cancer (breast, ovarian, tubal, or peritoneal cancers).  Pap test.** / Every 2 years from ages 76 through 26. Every 3 years starting at age 61 through age 76 or 93 with a history of 3 consecutive normal Pap tests.  HPV screening.** / Every 3 years from ages 37 through ages 60 to 51 with a history of 3 consecutive normal Pap tests.  Hepatitis C blood test.** / For any individual with known risks for hepatitis C.  Skin self-exam. / Monthly.  Influenza vaccine. / Every year.  Tetanus, diphtheria, and acellular pertussis (Tdap, Td) vaccine.** / Consult your health care provider. Pregnant women should receive 1 dose of Tdap vaccine during each pregnancy. 1 dose of Td every 10 years.  Varicella vaccine.** / Consult your health care provider. Pregnant females who do not have evidence of immunity should receive the first dose after pregnancy.  HPV vaccine. / 3 doses over 6 months, if 93 and younger. The vaccine is not recommended for use in pregnant females. However, pregnancy testing is not needed before receiving a dose.  Measles, mumps, rubella (MMR) vaccine.** / You need at least 1 dose of MMR if you were born in 1957 or later. You may also need a 2nd dose. For females of childbearing age, rubella immunity should be determined. If there is no evidence of immunity, females who are not pregnant should be vaccinated. If there is no evidence of immunity, females who are  pregnant should delay immunization until after pregnancy.  Pneumococcal  13-valent conjugate (PCV13) vaccine.** / Consult your health care provider.  Pneumococcal polysaccharide (PPSV23) vaccine.** / 1 to 2 doses if you smoke cigarettes or if you have certain conditions.  Meningococcal vaccine.** / 1 dose if you are age 68 to 8 years and a Market researcher living in a residence hall, or have one of several medical conditions, you need to get vaccinated against meningococcal disease. You may also need additional booster doses.  Hepatitis A vaccine.** / Consult your health care provider.  Hepatitis B vaccine.** / Consult your health care provider.  Haemophilus influenzae type b (Hib) vaccine.** / Consult your health care provider. Ages 7 to 53 years  Blood pressure check.** / Every year.  Lipid and cholesterol check.** / Every 5 years beginning at age 25 years.  Lung cancer screening. / Every year if you are aged 11-80 years and have a 30-pack-year history of smoking and currently smoke or have quit within the past 15 years. Yearly screening is stopped once you have quit smoking for at least 15 years or develop a health problem that would prevent you from having lung cancer treatment.  Clinical breast exam.** / Every year after age 48 years.  BRCA-related cancer risk assessment.** / For women who have family members with a BRCA-related cancer (breast, ovarian, tubal, or peritoneal cancers).  Mammogram.** / Every year beginning at age 41 years and continuing for as long as you are in good health. Consult with your health care provider.  Pap test.** / Every 3 years starting at age 65 years through age 37 or 70 years with a history of 3 consecutive normal Pap tests.  HPV screening.** / Every 3 years from ages 72 years through ages 60 to 40 years with a history of 3 consecutive normal Pap tests.  Fecal occult blood test (FOBT) of stool. / Every year beginning at age 21 years and continuing until age 5 years. You may not need to do this test if you get  a colonoscopy every 10 years.  Flexible sigmoidoscopy or colonoscopy.** / Every 5 years for a flexible sigmoidoscopy or every 10 years for a colonoscopy beginning at age 35 years and continuing until age 48 years.  Hepatitis C blood test.** / For all people born from [birth year] through 1965 and any individual with known risks for hepatitis C.  Skin self-exam. / Monthly.  Influenza vaccine. / Every year.  Tetanus, diphtheria, and acellular pertussis (Tdap/Td) vaccine.** / Consult your health care provider. Pregnant women should receive 1 dose of Tdap vaccine during each pregnancy. 1 dose of Td every 10 years.  Varicella vaccine.** / Consult your health care provider. Pregnant females who do not have evidence of immunity should receive the first dose after pregnancy.  Zoster vaccine.** / 1 dose for adults aged 30 years or older.  Measles, mumps, rubella (MMR) vaccine.** / You need at least 1 dose of MMR if you were born in 1957 or later. You may also need a second dose. For females of childbearing age, rubella immunity should be determined. If there is no evidence of immunity, females who are not pregnant should be vaccinated. If there is no evidence of immunity, females who are pregnant should delay immunization until after pregnancy.  Pneumococcal 13-valent conjugate (PCV13) vaccine.** / Consult your health care provider.  Pneumococcal polysaccharide (PPSV23) vaccine.** / 1 to 2 doses if you smoke cigarettes or if you have certain conditions.  Meningococcal vaccine.** /  Consult your health care provider.  Hepatitis A vaccine.** / Consult your health care provider.  Hepatitis B vaccine.** / Consult your health care provider.  Haemophilus influenzae type b (Hib) vaccine.** / Consult your health care provider. Ages 64 years and over  Blood pressure check.** / Every year.  Lipid and cholesterol check.** / Every 5 years beginning at age 23 years.  Lung cancer screening. / Every year if you  are aged 16-80 years and have a 30-pack-year history of smoking and currently smoke or have quit within the past 15 years. Yearly screening is stopped once you have quit smoking for at least 15 years or develop a health problem that would prevent you from having lung cancer treatment.  Clinical breast exam.** / Every year after age 74 years.  BRCA-related cancer risk assessment.** / For women who have family members with a BRCA-related cancer (breast, ovarian, tubal, or peritoneal cancers).  Mammogram.** / Every year beginning at age 44 years and continuing for as long as you are in good health. Consult with your health care provider.  Pap test.** / Every 3 years starting at age 58 years through age 22 or 39 years with 3 consecutive normal Pap tests. Testing can be stopped between 65 and 70 years with 3 consecutive normal Pap tests and no abnormal Pap or HPV tests in the past 10 years.  HPV screening.** / Every 3 years from ages 64 years through ages 70 or 61 years with a history of 3 consecutive normal Pap tests. Testing can be stopped between 65 and 70 years with 3 consecutive normal Pap tests and no abnormal Pap or HPV tests in the past 10 years.  Fecal occult blood test (FOBT) of stool. / Every year beginning at age 40 years and continuing until age 27 years. You may not need to do this test if you get a colonoscopy every 10 years.  Flexible sigmoidoscopy or colonoscopy.** / Every 5 years for a flexible sigmoidoscopy or every 10 years for a colonoscopy beginning at age 7 years and continuing until age 32 years.  Hepatitis C blood test.** / For all people born from [birth year] through 1964 and any individual with known risks for hepatitis C.  Osteoporosis screening.** / A one-time screening for women ages 30 years and over and women at risk for fractures or osteoporosis.  Skin self-exam. / Monthly.  Influenza vaccine. / Every year.  Tetanus, diphtheria, and acellular pertussis (Tdap/Td)  vaccine.** / 1 dose of Td every 10 years.  Varicella vaccine.** / Consult your health care provider.  Zoster vaccine.** / 1 dose for adults aged 35 years or older.  Pneumococcal 13-valent conjugate (PCV13) vaccine.** / Consult your health care provider.  Pneumococcal polysaccharide (PPSV23) vaccine.** / 1 dose for all adults aged 46 years and older.  Meningococcal vaccine.** / Consult your health care provider.  Hepatitis A vaccine.** / Consult your health care provider.  Hepatitis B vaccine.** / Consult your health care provider.  Haemophilus influenzae type b (Hib) vaccine.** / Consult your health care provider. ** Family history and personal history of risk and conditions may change your health care provider's recommendations.   This information is not intended to replace advice given to you by your health care provider. Make sure you discuss any questions you have with your health care provider.   Document Released: 07/01/2001 Document Revised: 05/26/2014 Document Reviewed: 09/30/2010 Elsevier Interactive Patient Education [employer].

## 2015-09-11 NOTE — Progress Notes (Signed)
Subjective:  Patient ID: Rebecca Marks, female    DOB: [birth year]  Age: 60 y.o. MRN: [id number]  CC: Annual Exam and Allergic Rhinitis   NEW TO ME  HPI Journeii Harvill presents for a CPX -  She wants to have her vitamin D level checked. She has a history of hepatitis C and wants to have her viral load rechecked. She complains of nasal allergies and wants to be referred to an allergist.  History Irmalinda has a past medical history of Hepatitis C; Ruptured lumbar disc; Arthritis; Allergy; CAP (community acquired pneumonia) (4/17-22/15); Anxiety; DVT (deep venous thrombosis) ([place]); Palpitations; Osteoarthritis; Chronic back pain; Raynaud phenomenon; Pulmonary function study abnormality; History of tobacco abuse; Volume overload; and Premature atrial contractions.   She has past surgical history that includes Cholecystectomy (1985).   Her family history includes Alzheimer's disease in her maternal grandmother; Breast cancer in her mother and sister; Diabetes in her father; Heart attack in her father; Hypertension in her father; Lymphoma in her father; Stroke in her maternal grandfather; Stroke (age of onset: 51) in her mother. There is no history of Colon cancer.She reports that she quit smoking about 2 years ago. Her smoking use included Cigarettes. She has a 6.25 pack-year smoking history. She has never used smokeless tobacco. She reports that she does not drink alcohol or use illicit drugs.  Outpatient Prescriptions Prior to Visit  Medication Sig Dispense Refill  . ALPRAZolam (XANAX) 0.5 MG tablet Take 0.5 mg by mouth at bedtime as needed for anxiety or sleep.   1  . Ascorbic Acid (VITAMIN C) 1000 MG tablet Take 2,000 mg by mouth 2 (two) times daily.     . Biotin 5000 MCG CAPS Take 1 capsule by mouth daily.    . Cholecalciferol (VITAMIN D3) 2000 UNITS TABS Take 2 tablets by mouth daily.    . Coenzyme Q10 200 MG capsule Take 200 mg by mouth daily.    [name] CORAL CALCIUM PO Take by mouth.  1000mg -3000mg  daily.    . cyclobenzaprine (FLEXERIL) 10 MG tablet Take 7.5 mg by mouth at bedtime.    . Glucosamine-Chondroitin (GLUCOSAMINE CHONDR COMPLEX PO) Take 1 capsule by mouth daily.    [name] ibuprofen (ADVIL,MOTRIN) 200 MG tablet Take 800 mg by mouth daily.     . Misc Natural Products (TART CHERRY ADVANCED) CAPS Take 100 capsules by mouth daily.    . Omega-3 Fatty Acids (ULTRA OMEGA 3 PO) Take by mouth. 1-2 grams daily.    [name] OVER THE COUNTER MEDICATION Take 1 capsule by mouth daily. Ginger 300mg /Turmeric 300mg     . PHOSPHATIDYLSERINE PO Take 300 mg by mouth daily.     . VOLTAREN 1 % GEL Apply 1 application topically at bedtime. Applied to knee, hand, fingers.    [name] amoxicillin-clavulanate (AUGMENTIN) 875-125 MG tablet Take 1 tablet by mouth 2 (two) times daily. 14 tablet 0  . fluconazole (DIFLUCAN) 150 MG tablet Take 1 tablet (150 mg total) by mouth every 3 (three) days. 2 tablet 0   No facility-administered medications prior to visit.    ROS Review of Systems  Constitutional: Negative.  Negative for fever, chills, diaphoresis, appetite change and fatigue.  HENT: Positive for congestion, postnasal drip and rhinorrhea. Negative for facial swelling, nosebleeds, sinus pressure, sneezing, tinnitus and trouble swallowing.   Eyes: Negative.   Respiratory: Negative.  Negative for cough, choking, chest tightness, shortness of breath and stridor.   Cardiovascular: Negative.  Negative for chest pain, palpitations and leg swelling.  Gastrointestinal:  Negative.  Negative for nausea, vomiting, abdominal pain, diarrhea and constipation.  Endocrine: Negative.   Genitourinary: Negative.  Negative for dysuria, hematuria, enuresis and difficulty urinating.  Musculoskeletal: Negative.  Negative for myalgias, back pain, joint swelling, gait problem and neck pain.  Skin: Negative.  Negative for color change and rash.  Allergic/Immunologic: Negative.   Neurological: Negative.   Hematological: Negative.   Negative for adenopathy. Does not bruise/bleed easily.  Psychiatric/Behavioral: Negative.     Objective:  BP 106/72 mmHg  Pulse 87  Temp(Src) 97.8 F (36.6 C) (Oral)  Resp 16  Ht 5\' 6"  (1.676 m)  Wt 128 lb (58.06 kg)  BMI 20.67 kg/m2  SpO2 99%  Physical Exam  Constitutional: She is oriented to person, place, and time. She appears well-developed and well-nourished. No distress.  HENT:  Head: Normocephalic and atraumatic.  Mouth/Throat: Oropharynx is clear and moist. No oropharyngeal exudate.  Eyes: Conjunctivae are normal. Right eye exhibits no discharge. Left eye exhibits no discharge. No scleral icterus.  Neck: Normal range of motion. Neck supple. No JVD present. No tracheal deviation present. No thyromegaly present.  Cardiovascular: Normal rate, regular rhythm, normal heart sounds and intact distal pulses.  Exam reveals no gallop and no friction rub.   No murmur heard. Pulmonary/Chest: Effort normal and breath sounds normal. No stridor. No respiratory distress. She has no wheezes. She has no rales. She exhibits no tenderness.  Abdominal: Soft. Bowel sounds are normal. She exhibits no distension and no mass. There is no tenderness. There is no rebound and no guarding.  Musculoskeletal: Normal range of motion. She exhibits no edema or tenderness.  Lymphadenopathy:    She has no cervical adenopathy.  Neurological: She is oriented to person, place, and time.  Skin: Skin is warm and dry. No rash noted. She is not diaphoretic. No erythema. No pallor.  Psychiatric: She has a normal mood and affect. Her behavior is normal. Judgment and thought content normal.  Vitals reviewed.   Lab Results  Component Value Date   WBC 10.3 09/12/2015   HGB 13.9 09/12/2015   HCT 39.9 09/12/2015   PLT 231.0 09/12/2015   GLUCOSE 102* 09/12/2015   CHOL 165 09/12/2015   TRIG 124.0 09/12/2015   HDL 39.40 09/12/2015   LDLCALC 100* 09/12/2015   ALT 6 09/12/2015   AST 16 09/12/2015   NA 139  09/12/2015   K 4.3 09/12/2015   CL 103 09/12/2015   CREATININE 0.82 09/12/2015   BUN 21 09/12/2015   CO2 28 09/12/2015   TSH 2.61 09/12/2015   INR 1.0 09/12/2015   HGBA1C 4.4* 07/30/2006    Assessment & Plan:   Tessica was seen today for annual exam and allergic rhinitis .  Diagnoses and all orders for this visit:  Hep C w/o coma, chronic ([place])- her  PT is normal so I think she still has a normal liver reserve, her hep C RNA is negative -     Protime-INR; Future -     Hepatitis C RNA quantitative; Future -     Urinalysis, Routine w reflex microscopic (not at [facility]); Future  Vitamin D deficiency- her vitamin D level is normal now -     VITAMIN D 25 Hydroxy (Vit-D Deficiency, Fractures); Future  Routine general medical examination at a health care facility- exam completed, labs ordered and reviewed, her Pap smears up-to-date, her mammogram is up-to-date, her colonoscopy is up-to-date, vaccines reviewed and updated, she was given patient education material. -     Comprehensive  metabolic panel; Future -     CBC with Differential/Platelet; Future -     TSH; Future -     Lipid panel; Future  Allergic rhinitis due to pollen -     Ambulatory referral to Allergy  Need for vaccination with 13-polyvalent pneumococcal conjugate vaccine -     Pneumococcal conjugate vaccine 13-valent   I have discontinued Ms. Malstrom's amoxicillin-clavulanate and fluconazole. I am also having her maintain her PHOSPHATIDYLSERINE PO, VOLTAREN, TART CHERRY ADVANCED, OVER THE COUNTER MEDICATION, Biotin, CORAL CALCIUM PO, Glucosamine-Chondroitin (GLUCOSAMINE CHONDR COMPLEX PO), Omega-3 Fatty Acids (ULTRA OMEGA 3 PO), vitamin C, ALPRAZolam, Vitamin D3, Coenzyme Q10, cyclobenzaprine, and ibuprofen.  No orders of the defined types were placed in this encounter.     Follow-up: Return if symptoms worsen or fail to improve.  Scarlette Calico, MD

## 2015-09-12 ENCOUNTER — Other Ambulatory Visit (INDEPENDENT_AMBULATORY_CARE_PROVIDER_SITE_OTHER): Payer: BLUE CROSS/BLUE SHIELD

## 2015-09-12 DIAGNOSIS — E559 Vitamin D deficiency, unspecified: Secondary | ICD-10-CM | POA: Diagnosis not present

## 2015-09-12 DIAGNOSIS — Z0000 Encounter for general adult medical examination without abnormal findings: Secondary | ICD-10-CM

## 2015-09-12 DIAGNOSIS — B182 Chronic viral hepatitis C: Secondary | ICD-10-CM

## 2015-09-12 LAB — COMPREHENSIVE METABOLIC PANEL
ALBUMIN: 4.6 g/dL (ref 3.5–5.2)
ALK PHOS: 60 U/L (ref 39–117)
ALT: 6 U/L (ref 0–35)
AST: 16 U/L (ref 0–37)
BUN: 21 mg/dL (ref 6–23)
CO2: 28 mEq/L (ref 19–32)
CREATININE: 0.82 mg/dL (ref 0.40–1.20)
Calcium: 9.7 mg/dL (ref 8.4–10.5)
Chloride: 103 mEq/L (ref 96–112)
GFR: 75.76 mL/min (ref >60.00)
GLUCOSE: 102 mg/dL — AB (ref 70–99)
POTASSIUM: 4.3 meq/L (ref 3.5–5.1)
Sodium: 139 mEq/L (ref 135–145)
TOTAL PROTEIN: 7.3 g/dL (ref 6.0–8.3)
Total Bilirubin: 0.6 mg/dL (ref 0.2–1.2)

## 2015-09-12 LAB — LIPID PANEL
CHOL/HDL RATIO: 4
Cholesterol: 165 mg/dL (ref 0–200)
HDL: 39.4 mg/dL (ref >39.00)
LDL Cholesterol: 100 mg/dL — ABNORMAL HIGH (ref 0–99)
NONHDL: 125.21
Triglycerides: 124 mg/dL (ref 0.0–149.0)
VLDL: 24.8 mg/dL (ref 0.0–40.0)

## 2015-09-12 LAB — CBC WITH DIFFERENTIAL/PLATELET
BASOS PCT: 0.4 % (ref 0.0–3.0)
Basophils Absolute: 0 10*3/uL (ref 0.0–0.1)
EOS ABS: 0.1 10*3/uL (ref 0.0–0.7)
EOS PCT: 1.4 % (ref 0.0–5.0)
HCT: 39.9 % (ref 36.0–46.0)
Hemoglobin: 13.9 g/dL (ref 12.0–15.0)
LYMPHS ABS: 3.5 10*3/uL (ref 0.7–4.0)
Lymphocytes Relative: 34.1 % (ref 12.0–46.0)
MCHC: 34.9 g/dL (ref 30.0–36.0)
MCV: 94.4 fl (ref 78.0–100.0)
MONO ABS: 0.9 10*3/uL (ref 0.1–1.0)
Monocytes Relative: 9.2 % (ref 3.0–12.0)
NEUTROS PCT: 54.9 % (ref 43.0–77.0)
Neutro Abs: 5.6 10*3/uL (ref 1.4–7.7)
Platelets: 231 10*3/uL (ref 150.0–400.0)
RBC: 4.23 Mil/uL (ref 3.87–5.11)
RDW: 12.7 % (ref 11.5–15.5)
WBC: 10.3 10*3/uL (ref 4.0–10.5)

## 2015-09-12 LAB — PROTIME-INR
INR: 1 ratio (ref 0.8–1.0)
Prothrombin Time: 10.7 s (ref 9.6–13.1)

## 2015-09-12 LAB — VITAMIN D 25 HYDROXY (VIT D DEFICIENCY, FRACTURES): VITD: 75.03 ng/mL (ref 30.00–100.00)

## 2015-09-12 LAB — TSH: TSH: 2.61 u[IU]/mL (ref 0.35–4.50)

## 2015-09-13 ENCOUNTER — Encounter: Payer: Self-pay | Admitting: Internal Medicine

## 2015-09-13 DIAGNOSIS — F4323 Adjustment disorder with mixed anxiety and depressed mood: Secondary | ICD-10-CM | POA: Diagnosis not present

## 2015-09-13 LAB — HEPATITIS C RNA QUANTITATIVE: HCV QUANT: NOT DETECTED [IU]/mL (ref <15)

## 2015-09-14 ENCOUNTER — Encounter: Payer: Self-pay | Admitting: Internal Medicine

## 2015-09-19 NOTE — Addendum Note (Signed)
Addended by: Janith Lima on: 09/19/2015 01:11 PM   Modules accepted: Miquel Dunn

## 2015-09-25 DIAGNOSIS — F4323 Adjustment disorder with mixed anxiety and depressed mood: Secondary | ICD-10-CM | POA: Diagnosis not present

## 2015-10-09 ENCOUNTER — Ambulatory Visit: Payer: BLUE CROSS/BLUE SHIELD | Admitting: Allergy and Immunology

## 2015-10-16 DIAGNOSIS — J309 Allergic rhinitis, unspecified: Secondary | ICD-10-CM | POA: Diagnosis not present

## 2015-10-16 DIAGNOSIS — R05 Cough: Secondary | ICD-10-CM | POA: Diagnosis not present

## 2015-10-25 DIAGNOSIS — F4323 Adjustment disorder with mixed anxiety and depressed mood: Secondary | ICD-10-CM | POA: Diagnosis not present

## 2015-11-06 DIAGNOSIS — F4323 Adjustment disorder with mixed anxiety and depressed mood: Secondary | ICD-10-CM | POA: Diagnosis not present

## 2015-11-13 DIAGNOSIS — M19041 Primary osteoarthritis, right hand: Secondary | ICD-10-CM | POA: Diagnosis not present

## 2015-11-13 DIAGNOSIS — M2550 Pain in unspecified joint: Secondary | ICD-10-CM | POA: Diagnosis not present

## 2015-11-13 DIAGNOSIS — M19042 Primary osteoarthritis, left hand: Secondary | ICD-10-CM | POA: Diagnosis not present

## 2015-11-13 DIAGNOSIS — F5104 Psychophysiologic insomnia: Secondary | ICD-10-CM | POA: Diagnosis not present

## 2015-12-17 DIAGNOSIS — Z1231 Encounter for screening mammogram for malignant neoplasm of breast: Secondary | ICD-10-CM | POA: Diagnosis not present

## 2015-12-17 DIAGNOSIS — Z803 Family history of malignant neoplasm of breast: Secondary | ICD-10-CM | POA: Diagnosis not present

## 2015-12-17 LAB — HM MAMMOGRAPHY

## 2015-12-20 ENCOUNTER — Encounter: Payer: Self-pay | Admitting: Internal Medicine

## 2016-01-15 DIAGNOSIS — F4323 Adjustment disorder with mixed anxiety and depressed mood: Secondary | ICD-10-CM | POA: Diagnosis not present

## 2016-01-25 NOTE — Telephone Encounter (Signed)
Please close Encounter °

## 2016-02-01 DIAGNOSIS — F4323 Adjustment disorder with mixed anxiety and depressed mood: Secondary | ICD-10-CM | POA: Diagnosis not present

## 2016-02-08 ENCOUNTER — Ambulatory Visit (INDEPENDENT_AMBULATORY_CARE_PROVIDER_SITE_OTHER): Payer: BLUE CROSS/BLUE SHIELD | Admitting: Internal Medicine

## 2016-02-08 ENCOUNTER — Encounter: Payer: Self-pay | Admitting: Internal Medicine

## 2016-02-08 VITALS — BP 120/80 | HR 87 | Temp 98.7°F | Ht 66.0 in | Wt 129.0 lb

## 2016-02-08 DIAGNOSIS — F439 Reaction to severe stress, unspecified: Secondary | ICD-10-CM

## 2016-02-08 DIAGNOSIS — B182 Chronic viral hepatitis C: Secondary | ICD-10-CM

## 2016-02-08 DIAGNOSIS — Z23 Encounter for immunization: Secondary | ICD-10-CM | POA: Diagnosis not present

## 2016-02-08 DIAGNOSIS — M545 Low back pain, unspecified: Secondary | ICD-10-CM

## 2016-02-08 DIAGNOSIS — G8929 Other chronic pain: Secondary | ICD-10-CM

## 2016-02-08 DIAGNOSIS — Z638 Other specified problems related to primary support group: Secondary | ICD-10-CM | POA: Diagnosis not present

## 2016-02-08 DIAGNOSIS — R202 Paresthesia of skin: Secondary | ICD-10-CM | POA: Diagnosis not present

## 2016-02-08 DIAGNOSIS — E559 Vitamin D deficiency, unspecified: Secondary | ICD-10-CM

## 2016-02-08 MED ORDER — DULOXETINE HCL 20 MG PO CPEP: 20.0000 mg | ORAL_CAPSULE | Freq: Every day | ORAL | 5 refills | Status: DC

## 2016-02-08 MED ORDER — CIPROFLOXACIN HCL 250 MG PO TABS: 250.0000 mg | ORAL_TABLET | Freq: Two times a day (BID) | ORAL | 0 refills | Status: DC

## 2016-02-08 NOTE — Progress Notes (Signed)
Subjective:  Patient ID: Rebecca Marks, female    DOB: Jun 23, 1955  Age: 60 y.o. MRN: [id number]  CC: No chief complaint on file.   HPI Rebecca Marks presents for escalating stress with her mother-in law's family x 2 years C/o one episode of urgency and a blood drop in urine x1 Hurts all over at times 1-2 days a week C/o leg pain C/o a throat lump w/stress  Outpatient Medications Prior to Visit  Medication Sig Dispense Refill  . ALPRAZolam (XANAX) 0.5 MG tablet Take 0.5 mg by mouth at bedtime as needed for anxiety or sleep.   1  . Ascorbic Acid (VITAMIN C) 1000 MG tablet Take 2,000 mg by mouth 2 (two) times daily.     . Biotin 5000 MCG CAPS Take 1 capsule by mouth daily.    . Cholecalciferol (VITAMIN D3) 2000 UNITS TABS Take 2 tablets by mouth daily.    . Coenzyme Q10 200 MG capsule Take 200 mg by mouth daily.    [name] CORAL CALCIUM PO Take by mouth. 1000mg -3000mg  daily.    . cyclobenzaprine (FLEXERIL) 10 MG tablet Take 7.5 mg by mouth at bedtime.    . Glucosamine-Chondroitin (GLUCOSAMINE CHONDR COMPLEX PO) Take 1 capsule by mouth daily.    [name] ibuprofen (ADVIL,MOTRIN) 200 MG tablet Take 800 mg by mouth daily.     . Misc Natural Products (TART CHERRY ADVANCED) CAPS Take 100 capsules by mouth daily.    . Omega-3 Fatty Acids (ULTRA OMEGA 3 PO) Take by mouth. 1-2 grams daily.    [name] OVER THE COUNTER MEDICATION Take 1 capsule by mouth daily. Ginger 300mg /Turmeric 300mg     . PHOSPHATIDYLSERINE PO Take 300 mg by mouth daily.     . VOLTAREN 1 % GEL Apply 1 application topically at bedtime. Applied to knee, hand, fingers.     No facility-administered medications prior to visit.     ROS Review of Systems  Constitutional: Positive for fatigue. Negative for activity change, appetite change, chills and unexpected weight change.  HENT: Negative for congestion, mouth sores and sinus pressure.   Eyes: Negative for visual disturbance.  Respiratory: Negative for cough and chest tightness.     Gastrointestinal: Negative for abdominal pain and nausea.  Genitourinary: Negative for difficulty urinating, frequency and vaginal pain.  Musculoskeletal: Positive for arthralgias, back pain and myalgias. Negative for gait problem.  Skin: Negative for pallor and rash.  Neurological: Negative for dizziness, tremors, weakness, numbness and headaches.  Psychiatric/Behavioral: Positive for dysphoric mood and sleep disturbance. Negative for confusion and suicidal ideas. The patient is nervous/anxious.     Objective:  BP 120/80   Pulse 87   Temp 98.7 F (37.1 C) (Oral)   Ht 5\' 6"  (1.676 m)   Wt 129 lb (58.5 kg)   SpO2 96%   BMI 20.82 kg/m   BP Readings from Last 3 Encounters:  02/08/16 120/80  09/11/15 106/72  08/18/15 110/78    Wt Readings from Last 3 Encounters:  02/08/16 129 lb (58.5 kg)  09/11/15 128 lb (58.1 kg)  08/18/15 128 lb (58.1 kg)    Physical Exam  Constitutional: She appears well-developed. No distress.  HENT:  Head: Normocephalic.  Right Ear: External ear normal.  Left Ear: External ear normal.  Nose: Nose normal.  Mouth/Throat: Oropharynx is clear and moist.  Eyes: Conjunctivae are normal. Pupils are equal, round, and reactive to light. Right eye exhibits no discharge. Left eye exhibits no discharge.  Neck: Normal range of motion. Neck supple. No  JVD present. No tracheal deviation present. No thyromegaly present.  Cardiovascular: Normal rate, regular rhythm and normal heart sounds.   Pulmonary/Chest: No stridor. No respiratory distress. She has no wheezes.  Abdominal: Soft. Bowel sounds are normal. She exhibits no distension and no mass. There is no tenderness. There is no rebound and no guarding.  Musculoskeletal: She exhibits no edema or tenderness.  Lymphadenopathy:    She has no cervical adenopathy.  Neurological: She displays normal reflexes. No cranial nerve deficit. She exhibits normal muscle tone. Coordination normal.  Skin: No rash noted. No  erythema.  Psychiatric: She has a normal mood and affect. Her behavior is normal. Judgment and thought content normal.    Lab Results  Component Value Date   WBC 10.3 09/12/2015   HGB 13.9 09/12/2015   HCT 39.9 09/12/2015   PLT 231.0 09/12/2015   GLUCOSE 102 (H) 09/12/2015   CHOL 165 09/12/2015   TRIG 124.0 09/12/2015   HDL 39.40 09/12/2015   LDLCALC 100 (H) 09/12/2015   ALT 6 09/12/2015   AST 16 09/12/2015   NA 139 09/12/2015   K 4.3 09/12/2015   CL 103 09/12/2015   CREATININE 0.82 09/12/2015   BUN 21 09/12/2015   CO2 28 09/12/2015   TSH 2.61 09/12/2015   INR 1.0 09/12/2015   HGBA1C 4.4 (L) 07/30/2006    No results found.  Assessment & Plan:   There are no diagnoses linked to this encounter. I am having Rebecca Marks maintain her PHOSPHATIDYLSERINE PO, VOLTAREN, TART CHERRY ADVANCED, OVER THE COUNTER MEDICATION, Biotin, CORAL CALCIUM PO, Glucosamine-Chondroitin (GLUCOSAMINE CHONDR COMPLEX PO), Omega-3 Fatty Acids (ULTRA OMEGA 3 PO), vitamin C, ALPRAZolam, Vitamin D3, Coenzyme Q10, cyclobenzaprine, ibuprofen, and traMADol.  Meds ordered this encounter  Medications  . traMADol (ULTRAM) 50 MG tablet    Sig: Take 25 mg by mouth at bedtime as needed.     Follow-up: No Follow-up on file.  Walker Kehr, MD

## 2016-02-08 NOTE — Assessment & Plan Note (Signed)
Vit D 

## 2016-02-08 NOTE — Assessment & Plan Note (Signed)
Resolved

## 2016-02-08 NOTE — Assessment & Plan Note (Addendum)
escalating stress with her mother-in law's family x 2 years Options discussed Start a low dose Cymbalta Taper off Xanax

## 2016-02-08 NOTE — Progress Notes (Signed)
Pre visit review using our clinic review tool, if applicable. No additional management support is needed unless otherwise documented below in the visit note. 

## 2016-02-12 ENCOUNTER — Other Ambulatory Visit (INDEPENDENT_AMBULATORY_CARE_PROVIDER_SITE_OTHER): Payer: BLUE CROSS/BLUE SHIELD

## 2016-02-12 DIAGNOSIS — E559 Vitamin D deficiency, unspecified: Secondary | ICD-10-CM

## 2016-02-12 DIAGNOSIS — Z638 Other specified problems related to primary support group: Secondary | ICD-10-CM | POA: Diagnosis not present

## 2016-02-12 DIAGNOSIS — R202 Paresthesia of skin: Secondary | ICD-10-CM

## 2016-02-12 DIAGNOSIS — F439 Reaction to severe stress, unspecified: Secondary | ICD-10-CM

## 2016-02-12 LAB — BASIC METABOLIC PANEL
BUN: 15 mg/dL (ref 6–23)
CO2: 30 meq/L (ref 19–32)
Calcium: 9.2 mg/dL (ref 8.4–10.5)
Chloride: 105 mEq/L (ref 96–112)
Creatinine, Ser: 0.75 mg/dL (ref 0.40–1.20)
GFR: 83.86 mL/min (ref >60.00)
GLUCOSE: 92 mg/dL (ref 70–99)
POTASSIUM: 4.3 meq/L (ref 3.5–5.1)
SODIUM: 140 meq/L (ref 135–145)

## 2016-02-12 LAB — URINALYSIS
Bilirubin Urine: NEGATIVE
HGB URINE DIPSTICK: NEGATIVE
Ketones, ur: NEGATIVE
Leukocytes, UA: NEGATIVE
NITRITE: NEGATIVE
PH: 6 (ref 5.0–8.0)
Specific Gravity, Urine: <=1.005 — AB (ref 1.000–1.030)
TOTAL PROTEIN, URINE-UPE24: NEGATIVE
URINE GLUCOSE: NEGATIVE
Urobilinogen, UA: 0.2 (ref 0.0–1.0)

## 2016-02-12 LAB — HEPATIC FUNCTION PANEL
ALBUMIN: 4.3 g/dL (ref 3.5–5.2)
ALK PHOS: 62 U/L (ref 39–117)
ALT: 4 U/L (ref 0–35)
AST: 15 U/L (ref 0–37)
Bilirubin, Direct: 0 mg/dL (ref 0.0–0.3)
TOTAL PROTEIN: 7 g/dL (ref 6.0–8.3)
Total Bilirubin: 0.4 mg/dL (ref 0.2–1.2)

## 2016-02-12 LAB — CBC WITH DIFFERENTIAL/PLATELET
BASOS ABS: 0 10*3/uL (ref 0.0–0.1)
Basophils Relative: 0.5 % (ref 0.0–3.0)
EOS PCT: 1.5 % (ref 0.0–5.0)
Eosinophils Absolute: 0.1 10*3/uL (ref 0.0–0.7)
HCT: 39.8 % (ref 36.0–46.0)
HEMOGLOBIN: 14 g/dL (ref 12.0–15.0)
LYMPHS ABS: 3.1 10*3/uL (ref 0.7–4.0)
Lymphocytes Relative: 44.4 % (ref 12.0–46.0)
MCHC: 35.2 g/dL (ref 30.0–36.0)
MCV: 94.2 fl (ref 78.0–100.0)
MONOS PCT: 9.1 % (ref 3.0–12.0)
Monocytes Absolute: 0.6 10*3/uL (ref 0.1–1.0)
NEUTROS PCT: 44.5 % (ref 43.0–77.0)
Neutro Abs: 3.1 10*3/uL (ref 1.4–7.7)
Platelets: 234 10*3/uL (ref 150.0–400.0)
RBC: 4.23 Mil/uL (ref 3.87–5.11)
RDW: 12.4 % (ref 11.5–15.5)
WBC: 7 10*3/uL (ref 4.0–10.5)

## 2016-02-12 LAB — LIPID PANEL
CHOL/HDL RATIO: 4
Cholesterol: 152 mg/dL (ref 0–200)
HDL: 37.4 mg/dL — AB (ref >39.00)
LDL Cholesterol: 80 mg/dL (ref 0–99)
NONHDL: 114.45
TRIGLYCERIDES: 172 mg/dL — AB (ref 0.0–149.0)
VLDL: 34.4 mg/dL (ref 0.0–40.0)

## 2016-02-12 LAB — VITAMIN B12: VITAMIN B 12: 426 pg/mL (ref 211–911)

## 2016-02-12 LAB — CK: CK TOTAL: 110 U/L (ref 7–177)

## 2016-02-12 LAB — TSH: TSH: 1.63 u[IU]/mL (ref 0.35–4.50)

## 2016-02-29 ENCOUNTER — Ambulatory Visit: Payer: BLUE CROSS/BLUE SHIELD | Admitting: Cardiology

## 2016-03-18 DIAGNOSIS — F4323 Adjustment disorder with mixed anxiety and depressed mood: Secondary | ICD-10-CM | POA: Diagnosis not present

## 2016-03-19 ENCOUNTER — Ambulatory Visit (INDEPENDENT_AMBULATORY_CARE_PROVIDER_SITE_OTHER): Payer: BLUE CROSS/BLUE SHIELD | Admitting: Family Medicine

## 2016-03-19 VITALS — BP 128/80 | HR 85 | Temp 98.9°F | Resp 16 | Ht 66.5 in | Wt 135.0 lb

## 2016-03-19 DIAGNOSIS — J069 Acute upper respiratory infection, unspecified: Secondary | ICD-10-CM

## 2016-03-19 MED ORDER — PREDNISONE 10 MG (21) PO TBPK: ORAL_TABLET | ORAL | 0 refills | Status: DC

## 2016-03-19 MED ORDER — CLARITHROMYCIN 250 MG PO TABS: 250.0000 mg | ORAL_TABLET | Freq: Two times a day (BID) | ORAL | 0 refills | Status: DC

## 2016-03-19 MED ORDER — FLUCONAZOLE 150 MG PO TABS: 150.0000 mg | ORAL_TABLET | Freq: Once | ORAL | 0 refills | Status: AC

## 2016-03-19 NOTE — Patient Instructions (Signed)
It was very good to meet you today!  Take the Biaxin twice daily for the next 7 days.  Use the prednisone if you're having issues in the next few days with cough.   I have sent in diflucan as well.  Enjoy your trip this weekend

## 2016-03-19 NOTE — Progress Notes (Signed)
     IF you received an x-ray today, you will receive an invoice from [facility]. Please contact [facility] at [phone] with questions or concerns regarding your invoice.   IF you received labwork today, you will receive an invoice from [facility]. Please contact [facility] at [phone] with questions or concerns regarding your invoice.   Our billing staff will not be able to assist you with questions regarding bills from these companies.  You will be contacted with the lab results as soon as they are available. The fastest way to get your results is to activate your My Chart account. Instructions are located on the last page of this paperwork. If you have not heard from us regarding the results in 2 weeks, please contact this office.      

## 2016-03-19 NOTE — Progress Notes (Signed)
Rebecca Marks is a 60 y.o. female who presents to [facility] today for cough:  1.  Cough:  Present for past several days.  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  Had productive cough.  She herself has productive cough as well.  Some URI symptoms as well.  Subjective fevers.    Long-term smoker. She quit 3 years ago.  Has had hospitalization in past 2 years for "viral superbug."  Has had trouble with 2-3 episodes of bronchitis per year for past several years.    No N/V.    ROS as above.    Family history of HTN, heart disease, and HLD in father.    PMH reviewed. Patient is a nonsmoker.   Past Medical History:  Diagnosis Date  . Allergy   . Anxiety   . Arthritis    R wrist and R knee  . CAP (community acquired pneumonia) 4/17-22/15   Rochephin & IV Zithromax & 3 days Levaquin  . Chronic back pain   . DVT (deep venous thrombosis) ([place])    a. in her 20s while on OCPs.  . Hepatitis C    contaminated blood @ her job, prior notes indicate spontaneously cleared  . History of tobacco abuse   . Osteoarthritis   . Palpitations   . Premature atrial contractions    a. Holter 11/2013: few PACs.  . Pulmonary function study abnormality    a. 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  . Raynaud phenomenon   . Ruptured lumbar disc    4 herniated disc   . Volume overload    a.  history of volume overload during hospital stay for PNA in 08/2013 felt iatrogenic due to receiving a lot of IV fluid. 2D echo 08/2013 showed EF 123456, normal diastolic function parameters.   Past Surgical History:  Procedure Laterality Date  . CHOLECYSTECTOMY  1985    Medications reviewed. Current Outpatient Prescriptions  Medication Sig Dispense Refill  . ALPRAZolam (XANAX) 0.5 MG tablet Take 0.5 mg by mouth at bedtime as needed for anxiety or sleep.   1  . Ascorbic Acid (VITAMIN C) 1000 MG tablet Take 2,000 mg by mouth 2  (two) times daily.     . Biotin 5000 MCG CAPS Take 1 capsule by mouth daily.    . Cholecalciferol (VITAMIN D3) 2000 UNITS TABS Take 2 tablets by mouth daily.    . Coenzyme Q10 200 MG capsule Take 200 mg by mouth daily.    [name] CORAL CALCIUM PO Take by mouth. 1000mg -3000mg  daily.    . cyclobenzaprine (FLEXERIL) 10 MG tablet Take 7.5 mg by mouth at bedtime.    . Glucosamine-Chondroitin (GLUCOSAMINE CHONDR COMPLEX PO) Take 1 capsule by mouth daily.    [name] ibuprofen (ADVIL,MOTRIN) 200 MG tablet Take 800 mg by mouth daily.     . Misc Natural Products (TART CHERRY ADVANCED) CAPS Take 100 capsules by mouth daily.    . Omega-3 Fatty Acids (ULTRA OMEGA 3 PO) Take by mouth. 1-2 grams daily.    [name] OVER THE COUNTER MEDICATION Take 1 capsule by mouth daily. Ginger 300mg /Turmeric 300mg     . PHOSPHATIDYLSERINE PO Take 300 mg by mouth daily.     . traMADol (ULTRAM) 50 MG tablet Take 25 mg by mouth at bedtime as needed.    . VOLTAREN 1 % GEL Apply 1 application topically at bedtime. Applied to knee, hand, fingers.    . ciprofloxacin (  CIPRO) 250 MG tablet Take 1 tablet (250 mg total) by mouth 2 (two) times daily. (Patient not taking: Reported on 03/19/2016) 8 tablet 0  . DULoxetine (CYMBALTA) 20 MG capsule Take 1 capsule (20 mg total) by mouth daily. (Patient not taking: Reported on 03/19/2016) 30 capsule 5   No current facility-administered medications for this visit.      Physical Exam:  BP 128/80 (BP Location: Right Arm, Patient Position: Sitting, Cuff Size: Normal)   Pulse 85   Temp 98.9 F (37.2 C) (Oral)   Resp 16   Ht 5' 6.5" (1.689 m)   Wt 135 lb (61.2 kg)   SpO2 100%   BMI 21.46 kg/m  Gen:  Patient sitting on exam table, appears stated age in no acute distress Head: Normocephalic atraumatic Eyes: EOMI, PERRL, sclera and conjunctiva non-erythematous Ears:  Canals clear bilaterally.  TMs pearly gray bilaterally without erythema or bulging.   Nose:  Nasal turbinates grossly enlarged bilaterally. Some  exudates noted. Tender to palpation of maxillary sinus  Mouth: Mucosa membranes moist. Tonsils +2, nonenlarged, non-erythematous. Neck: No cervical lymphadenopathy noted Heart:  RRR, no murmurs auscultated. Pulm:  Clear to auscultation bilaterally with good air movement.  No wheezes or rales noted.  Multiple episodes of coughing during exam.      Assessment and Plan:  1.  URI with cough - treat with biaxin plus prednisone to treat cough. - multiple episodes of bronchitis.  - Needs eval for COPD.   - has trip out of town this weekend.  I have printed off prednisone to take if she needs it.  - she also frequently gets yeast infection with abx -- sent in diflucan.

## 2016-03-25 DIAGNOSIS — F4323 Adjustment disorder with mixed anxiety and depressed mood: Secondary | ICD-10-CM | POA: Diagnosis not present

## 2016-04-01 DIAGNOSIS — Z6821 Body mass index (BMI) 21.0-21.9, adult: Secondary | ICD-10-CM | POA: Diagnosis not present

## 2016-04-01 DIAGNOSIS — Z01419 Encounter for gynecological examination (general) (routine) without abnormal findings: Secondary | ICD-10-CM | POA: Diagnosis not present

## 2016-04-09 ENCOUNTER — Ambulatory Visit: Payer: BLUE CROSS/BLUE SHIELD | Admitting: Internal Medicine

## 2016-05-02 ENCOUNTER — Ambulatory Visit (INDEPENDENT_AMBULATORY_CARE_PROVIDER_SITE_OTHER): Payer: BLUE CROSS/BLUE SHIELD | Admitting: Cardiology

## 2016-05-02 ENCOUNTER — Encounter: Payer: Self-pay | Admitting: Cardiology

## 2016-05-02 VITALS — BP 120/76 | HR 70 | Ht 65.0 in | Wt 134.4 lb

## 2016-05-02 DIAGNOSIS — I491 Atrial premature depolarization: Secondary | ICD-10-CM

## 2016-05-02 DIAGNOSIS — F4323 Adjustment disorder with mixed anxiety and depressed mood: Secondary | ICD-10-CM | POA: Diagnosis not present

## 2016-05-02 NOTE — Patient Instructions (Signed)
Medication Instructions:  Your physician recommends that you continue on your current medications as directed. Please refer to the Current Medication list given to you today.   Labwork: None   Testing/Procedures: None   Follow-Up: Your physician recommends that you schedule a follow-up appointment as needed with Dr Aundra Dubin.        If you need a refill on your cardiac medications before your next appointment, please call your pharmacy.

## 2016-05-04 NOTE — Progress Notes (Signed)
Patient ID: Rebecca Marks, female   DOB: 11/01/1955, 60 y.o.   MRN: [id number] PCP: Dr. Ronnald Ramp  60 yo with history of diastolic CHF exacerbation in setting of iatrogenic volume overload during episode of PNA presents for cardiology followup.  She has been doing well.  She does note some palpitations when lying in bed at night.  She does not have exertional dyspnea or chest pain.  She gets regular exercise.   ECG: NSR, rightward axis  Labs (3/15): TSH normal Labs (4/15): BNP 1147 => 454  Labs (9/17): LDL 80, HDL 37, K 4.3, creatinine 0.75  PMH: 1. Osteoarthritis 2. Raynauds phenomenon 3. HCV: Spontaneously cleared.  4. H/o DVT in her 50s while on OCPs.  5. PNA (metapneumovirus) in 4/15 6. Echo (4/15) with EF 60-65%, normal RV, no significant valvular abnormalities.  7. PFTs (7/15) with minimal obstructive airways disease 8. PACs: Holter (7/15) with occasional PACs.   SH: Married, prior smoking, occasional ETOH.   FH: Father with MI x 3, earliest in his 71s.   ROS: All systems reviewed and negative except as per HPI.   Current Outpatient Prescriptions  Medication Sig Dispense Refill  . ALPRAZolam (XANAX) 0.5 MG tablet Take 0.5 mg by mouth at bedtime as needed for anxiety or sleep.   1  . Ascorbic Acid (VITAMIN C) 1000 MG tablet Take 2,000 mg by mouth 2 (two) times daily.     . Biotin 5000 MCG CAPS Take 1 capsule by mouth daily.    . Cholecalciferol (VITAMIN D3) 2000 UNITS TABS Take 2 tablets by mouth daily.    . Coenzyme Q10 200 MG capsule Take 200 mg by mouth daily.    [name] CORAL CALCIUM PO Take by mouth. 1000mg -3000mg  daily.    . cyclobenzaprine (FLEXERIL) 10 MG tablet Take 7.5 mg by mouth at bedtime.    . Glucosamine-Chondroitin (GLUCOSAMINE CHONDR COMPLEX PO) Take 1 capsule by mouth daily.    [name] ibuprofen (ADVIL,MOTRIN) 200 MG tablet Take 800 mg by mouth daily.     . Misc Natural Products (TART CHERRY ADVANCED) CAPS Take 100 capsules by mouth daily.    . Omega-3 Fatty Acids  (ULTRA OMEGA 3 PO) Take by mouth. 1-2 grams daily.    [name] OVER THE COUNTER MEDICATION Take 1 capsule by mouth daily. Ginger 300mg /Turmeric 300mg     . PHOSPHATIDYLSERINE PO Take 300 mg by mouth daily.     . traMADol (ULTRAM) 50 MG tablet Take 25 mg by mouth at bedtime as needed.    . VOLTAREN 1 % GEL Apply 1 application topically at bedtime. Applied to knee, hand, fingers.     No current facility-administered medications for this visit.     BP 120/76   Pulse 70   Ht 5\' 5"  (1.651 m)   Wt 134 lb 6.4 oz (61 kg)   SpO2 98%   BMI 22.37 kg/m  General: NAD Neck: No JVD, no thyromegaly or thyroid nodule.  Lungs: Clear to auscultation bilaterally with normal respiratory effort. CV: Nondisplaced PMI.  Heart regular S1/S2, no S3/S4, no murmur.  No peripheral edema.  No carotid bruit.  Normal pedal pulses.  Abdomen: Soft, nontender, no hepatosplenomegaly, no distention.  Skin: Intact without lesions or rashes.  Neurologic: Alert and oriented x 3.  Psych: Normal affect. Extremities: No clubbing or cyanosis.  HEENT: Normal.   Assessment/Plan: Palpitations: She primarily notes palpitations when lying down in the bed at night. Prior holter showed PACs, I suspect that her current palpitations are due to  PACs.  I recommended that she stop drinking coffee in the evening or drink decaf.  Followup with me prn (can [facility] with [facility]).   Loralie Champagne 05/04/2016

## 2016-05-07 DIAGNOSIS — F4323 Adjustment disorder with mixed anxiety and depressed mood: Secondary | ICD-10-CM | POA: Diagnosis not present

## 2016-05-16 ENCOUNTER — Ambulatory Visit (INDEPENDENT_AMBULATORY_CARE_PROVIDER_SITE_OTHER): Payer: BLUE CROSS/BLUE SHIELD | Admitting: Physician Assistant

## 2016-05-16 VITALS — BP 124/84 | HR 84 | Temp 99.1°F | Resp 16 | Ht 65.0 in | Wt 132.4 lb

## 2016-05-16 DIAGNOSIS — J988 Other specified respiratory disorders: Secondary | ICD-10-CM | POA: Diagnosis not present

## 2016-05-16 MED ORDER — CLARITHROMYCIN ER 500 MG PO TB24: 1000.0000 mg | ORAL_TABLET | Freq: Every day | ORAL | 0 refills | Status: DC

## 2016-05-16 MED ORDER — FLUCONAZOLE 150 MG PO TABS: 150.0000 mg | ORAL_TABLET | Freq: Once | ORAL | 0 refills | Status: AC

## 2016-05-16 NOTE — Patient Instructions (Addendum)
Please take maximum strength guafinesin 1200mg  every 12 hours. Please continue to hydrate well with 64 oz. Please take medication as prescribed.    Viral Respiratory Infection Introduction A viral respiratory infection is an illness that affects parts of the body used for breathing, like the lungs, nose, and throat. It is caused by a germ called a virus. Some examples of this kind of infection are:  A cold.  The flu (influenza).  A respiratory syncytial virus (RSV) infection. How do I know if I have this infection? Most of the time this infection causes:  A stuffy or runny nose.  Yellow or green fluid in the nose.  A cough.  Sneezing.  Tiredness (fatigue).  Achy muscles.  A sore throat.  Sweating or chills.  A fever.  A headache. How is this infection treated? If the flu is diagnosed early, it may be treated with an antiviral medicine. This medicine shortens the length of time a person has symptoms. Symptoms may be treated with over-the-counter and prescription medicines, such as:  Expectorants. These make it easier to cough up mucus.  Decongestant nasal sprays. Doctors do not prescribe antibiotic medicines for viral infections. They do not work with this kind of infection. How do I know if I should stay home? To keep others from getting sick, stay home if you have:  A fever.  A lasting cough.  A sore throat.  A runny nose.  Sneezing.  Muscles aches.  Headaches.  Tiredness.  Weakness.  Chills.  Sweating.  An upset stomach (nausea). Follow these instructions at home:  Rest as much as possible.  Take over-the-counter and prescription medicines only as told by your doctor.  Drink enough fluid to keep your pee (urine) clear or pale yellow.  Gargle with salt water. Do this 3-4 times per day or as needed. To make a salt-water mixture, dissolve -1 tsp of salt in 1 cup of warm water. Make sure the salt dissolves all the way.  Use nose drops made  from salt water. This helps with stuffiness (congestion). It also helps soften the skin around your nose.  Do not drink alcohol.  Do not use tobacco products, including cigarettes, chewing tobacco, and e-cigarettes. If you need help quitting, ask your doctor. Get help if:  Your symptoms last for 10 days or longer.  Your symptoms get worse over time.  You have a fever.  You have very bad pain in your face or forehead.  Parts of your jaw or neck become very swollen. Get help right away if:  You feel pain or pressure in your chest.  You have shortness of breath.  You faint or feel like you will faint.  You keep throwing up (vomiting).  You feel confused. This information is not intended to replace advice given to you by your health care provider. Make sure you discuss any questions you have with your health care provider. Document Released: 04/17/2008 Document Revised: 10/11/2015 Document Reviewed: 10/11/2014  2017 Elsevier

## 2016-05-16 NOTE — Progress Notes (Signed)
[facility] [address] [place] [address] [phone]  Date:  05/16/2016   Name:  Rebecca Marks   DOB:  12-Sep-1955   MRN:  [id number]  PCP:  Walker Kehr, MD    History of Present Illness: Chief Complaint  Patient presents with  . URI    non productive cough, sore throat x 3 days    Rebecca Marks is a 60 y.o. female patient who presents to [facility] for cough and sore throat for 3 days.  Non-productive cough and sore throat 3 days.  Morning productive only.  No color known.  No sob, but is not breathing deeply.  Subjective fever and chills. No bodyaches.  She has some nasal congestion, but no ear pain.   No smoking, former smoker 5 cigarettes per day for about 15 years.  16000, 5 bottles per water.      Patient Active Problem List   Diagnosis Date Noted  . Stress at home 02/08/2016  . Vitamin D deficiency 09/11/2015  . Routine general medical examination at a health care facility 09/11/2015  . Allergic rhinitis due to pollen 09/11/2015  . Premature atrial contractions   . Chronic low back pain 08/21/2014  . Nonallopathic lesion of lumbosacral region 08/21/2014  . Nonallopathic lesion of thoracic region 08/21/2014  . Nonallopathic lesion of sacral region 08/21/2014  . Osteoarthritis 08/02/2013  . Raynaud phenomenon 08/02/2013  . Hep C w/o coma, chronic ([place]) 05/24/2010    Past Medical History:  Diagnosis Date  . Allergy   . Anxiety   . Arthritis    R wrist and R knee  . CAP (community acquired pneumonia) 4/17-22/15   Rochephin & IV Zithromax & 3 days Levaquin  . Chronic back pain   . DVT (deep venous thrombosis) ([place])    a. in her 20s while on OCPs.  . Hepatitis C    contaminated blood @ her job, prior notes indicate spontaneously cleared  . History of tobacco abuse   . Osteoarthritis   . Palpitations   . Premature atrial contractions    a. Holter 11/2013: few PACs.  . Pulmonary function study abnormality    a. PFTs (7/15) with  minimal obstructive airways disease.  . Raynaud phenomenon   . Ruptured lumbar disc    4 herniated disc   . Volume overload    a.  history of volume overload during hospital stay for PNA in 08/2013 felt iatrogenic due to receiving a lot of IV fluid. 2D echo 08/2013 showed EF 123456, normal diastolic function parameters.    Past Surgical History:  Procedure Laterality Date  . CHOLECYSTECTOMY  1985    Social History  Substance Use Topics  . Smoking status: Former Smoker    Packs/day: 0.25    Years: 25.00    Types: Cigarettes    Quit date: 08/19/2013  . Smokeless tobacco: Never Used     Comment: smoked 1979-2008, up to 1 ppd. As of 08/01/13 1 cig every 2 weeks  . Alcohol use No    Family History  Problem Relation Age of Onset  . Diabetes Father   . Hypertension Father   . Heart attack Father     >55  . Lymphoma Father   . Stroke Mother 107    after bedridden post fall with BLE paralysis  . Breast cancer Mother      X 3  . Breast cancer Sister   . Alzheimer's disease Maternal Grandmother   . Stroke  Maternal Grandfather     in 38s  . Colon cancer Neg Hx     No Known Allergies  Medication list has been reviewed and updated.  Current Outpatient Prescriptions on File Prior to Visit  Medication Sig Dispense Refill  . ALPRAZolam (XANAX) 0.5 MG tablet Take 0.5 mg by mouth at bedtime as needed for anxiety or sleep.   1  . Ascorbic Acid (VITAMIN C) 1000 MG tablet Take 2,000 mg by mouth 2 (two) times daily.     . Biotin 5000 MCG CAPS Take 1 capsule by mouth daily.    . Cholecalciferol (VITAMIN D3) 2000 UNITS TABS Take 2 tablets by mouth daily.    . Coenzyme Q10 200 MG capsule Take 200 mg by mouth daily.    [name] CORAL CALCIUM PO Take by mouth. 1000mg -3000mg  daily.    . cyclobenzaprine (FLEXERIL) 10 MG tablet Take 7.5 mg by mouth at bedtime.    [name] ibuprofen (ADVIL,MOTRIN) 200 MG tablet Take 800 mg by mouth daily.     . Misc Natural Products (TART CHERRY ADVANCED) CAPS Take 100 capsules  by mouth daily.    . Omega-3 Fatty Acids (ULTRA OMEGA 3 PO) Take by mouth. 1-2 grams daily.    [name] OVER THE COUNTER MEDICATION Take 1 capsule by mouth daily. Ginger 300mg /Turmeric 300mg     . PHOSPHATIDYLSERINE PO Take 300 mg by mouth daily.     . traMADol (ULTRAM) 50 MG tablet Take 25 mg by mouth at bedtime as needed.    . VOLTAREN 1 % GEL Apply 1 application topically at bedtime. Applied to knee, hand, fingers.    . Glucosamine-Chondroitin (GLUCOSAMINE CHONDR COMPLEX PO) Take 1 capsule by mouth daily.     No current facility-administered medications on file prior to visit.     ROS ROS otherwise unremarkable unless listed above.   Physical Examination: BP 124/84 (BP Location: Right Arm, Patient Position: Sitting, Cuff Size: Small)   Pulse 84   Temp 99.1 F (37.3 C) (Oral)   Resp 16   Ht 5\' 5"  (1.651 m)   Wt 132 lb 6.4 oz (60.1 kg)   SpO2 99%   BMI 22.03 kg/m  Ideal Body Weight: Weight in (lb) to have BMI = 25: 149.9  Physical Exam  Constitutional: She is oriented to person, place, and time. She appears well-developed and well-nourished. No distress.  HENT:  Head: Normocephalic and atraumatic.  Right Ear: Tympanic membrane, external ear and ear canal normal.  Left Ear: Tympanic membrane, external ear and ear canal normal.  Nose: Mucosal edema and rhinorrhea present. Right sinus exhibits no maxillary sinus tenderness and no frontal sinus tenderness. Left sinus exhibits no maxillary sinus tenderness and no frontal sinus tenderness.  Mouth/Throat: No uvula swelling. No oropharyngeal exudate, posterior oropharyngeal edema or posterior oropharyngeal erythema.  Eyes: Conjunctivae and EOM are normal. Pupils are equal, round, and reactive to light.  Cardiovascular: Normal rate and regular rhythm.  Exam reveals no gallop, no distant heart sounds and no friction rub.   No murmur heard. Pulmonary/Chest: Effort normal. No respiratory distress. She has no decreased breath sounds. She has no  wheezes. She has no rhonchi.  Lymphadenopathy:       Head (right side): No submandibular, no tonsillar, no preauricular and no posterior auricular adenopathy present.       Head (left side): No submandibular, no tonsillar, no preauricular and no posterior auricular adenopathy present.  Neurological: She is alert and oriented to person, place, and time.  Skin: She  is not diaphoretic.  Psychiatric: She has a normal mood and affect. Her behavior is normal.     Assessment and Plan: Rebecca Marks is a 60 y.o. female who is here today for cc of cough. Given hx, will cover for possible bacterial etiology. This however is likely viral.   Respiratory infection - Plan: DISCONTINUED: clarithromycin (BIAXIN XL) 500 MG 24 hr tablet   Ivar Drape, PA-C [facility] 05/16/2016 4:12 PM

## 2016-05-20 ENCOUNTER — Encounter: Payer: Self-pay | Admitting: Family

## 2016-05-20 ENCOUNTER — Ambulatory Visit (INDEPENDENT_AMBULATORY_CARE_PROVIDER_SITE_OTHER): Payer: BLUE CROSS/BLUE SHIELD | Admitting: Family

## 2016-05-20 DIAGNOSIS — J988 Other specified respiratory disorders: Secondary | ICD-10-CM | POA: Diagnosis not present

## 2016-05-20 MED ORDER — CLARITHROMYCIN ER 500 MG PO TB24: 1000.0000 mg | ORAL_TABLET | Freq: Every day | ORAL | 0 refills | Status: AC

## 2016-05-20 MED ORDER — PROMETHAZINE-CODEINE 6.25-10 MG/5ML PO SYRP: 5.0000 mL | ORAL_SOLUTION | Freq: Four times a day (QID) | ORAL | 0 refills | Status: DC | PRN

## 2016-05-20 NOTE — Patient Instructions (Signed)
Thank you for choosing [facility].  SUMMARY AND INSTRUCTIONS:  Medication:  Your prescription(s) have been submitted to your pharmacy or been printed and provided for you. Please take as directed and contact our office if you believe you are having problem(s) with the medication(s) or have any questions.  Follow up:  If your symptoms worsen or fail to improve, please contact our office for further instruction, or in case of emergency go directly to the emergency room at the closest medical facility.    General Recommendations:    Please drink plenty of fluids.  Get plenty of rest   Sleep in humidified air  Use saline nasal sprays  Netti pot   OTC Medications:  Decongestants - helps relieve congestion   Flonase (generic fluticasone) or Nasacort (generic triamcinolone) - please make sure to use the "cross-over" technique at a 45 degree angle towards the opposite eye as opposed to straight up the nasal passageway.   Sudafed (generic pseudoephedrine - Note this is the one that is available behind the pharmacy counter); Products with phenylephrine (-PE) may also be used but is often not as effective as pseudoephedrine.   If you have HIGH BLOOD PRESSURE - Coricidin HBP; AVOID any product that is -D as this contains pseudoephedrine which may increase your blood pressure.  Afrin (oxymetazoline) every 6-8 hours for up to 3 days.   Allergies - helps relieve runny nose, itchy eyes and sneezing   Claritin (generic loratidine), Allegra (fexofenidine), or Zyrtec (generic cyrterizine) for runny nose. These medications should not cause drowsiness.  Note - Benadryl (generic diphenhydramine) may be used however may cause drowsiness  Cough -   Delsym or Robitussin (generic dextromethorphan)  Expectorants - helps loosen mucus to ease removal   Mucinex (generic guaifenesin) as directed on the package.  Headaches / General Aches   Tylenol (generic acetaminophen) - DO NOT  EXCEED 3 grams (3,000 mg) in a 24 hour time period  Advil/Motrin (generic ibuprofen)   Sore Throat -   Salt water gargle   Chloraseptic (generic benzocaine) spray or lozenges / Sucrets (generic dyclonine)     

## 2016-05-20 NOTE — Assessment & Plan Note (Signed)
Symptoms and exam consistent with acute upper respiratory infection most likely viral. Recommend continued watchful waiting as pulmonary exam is normal. Continue over-the-counter medications for symptom relief and supportive care. Start promethazine-codeine as needed for cough and sleep. Additional clarithromycin provided to complete dose if needed.

## 2016-05-20 NOTE — Progress Notes (Signed)
Subjective:    Patient ID: Rebecca Marks, female    DOB: [birth year], 61 y.o.   MRN: [id number]  Chief Complaint  Patient presents with  . Cough    chest congestion and productive cough, x1 week    HPI:  Rebecca Marks is a 61 y.o. female who  has a past medical history of Allergy; Anxiety; Arthritis; CAP (community acquired pneumonia) (4/17-22/15); Chronic back pain; DVT (deep venous thrombosis) ([place]); Hepatitis C; History of tobacco abuse; Osteoarthritis; Palpitations; Premature atrial contractions; Pulmonary function study abnormality; Raynaud phenomenon; Ruptured lumbar disc; and Volume overload. and presents today for an acute office visit.  This is a new problem. Associated symptoms of chest congestion and productive cough that have been going on for approximately one week. Low grade temperature. Describes that she had a barking type coughing with some wheezing that is worse in the morning. Was seen at a primary care office 4 days ago and was diagnosed with a respiratory infection with instructions to take guaifenesin and hydrate well. Reports taking the medication as prescribed and denies adverse side effects. Overall the symptoms have progressively worsened since initial onset with a T-max of about 100.5. Modifying factors include garlic which has not helped very much.    kNo Known Allergies    Outpatient Medications Prior to Visit  Medication Sig Dispense Refill  . ALPRAZolam (XANAX) 0.5 MG tablet Take 0.5 mg by mouth at bedtime as needed for anxiety or sleep.   1  . Ascorbic Acid (VITAMIN C) 1000 MG tablet Take 2,000 mg by mouth 2 (two) times daily.     . Biotin 5000 MCG CAPS Take 1 capsule by mouth daily.    . Cholecalciferol (VITAMIN D3) 2000 UNITS TABS Take 2 tablets by mouth daily.    . Coenzyme Q10 200 MG capsule Take 200 mg by mouth daily.    [name] CORAL CALCIUM PO Take by mouth. 1000mg -3000mg  daily.    . cyclobenzaprine (FLEXERIL) 10 MG tablet Take 7.5 mg by mouth  at bedtime.    [name] ibuprofen (ADVIL,MOTRIN) 200 MG tablet Take 800 mg by mouth daily.     . Omega-3 Fatty Acids (ULTRA OMEGA 3 PO) Take by mouth. 1-2 grams daily.    [name] PHOSPHATIDYLSERINE PO Take 300 mg by mouth daily.     . traMADol (ULTRAM) 50 MG tablet Take 25 mg by mouth at bedtime as needed.    . VOLTAREN 1 % GEL Apply 1 application topically at bedtime. Applied to knee, hand, fingers.    . clarithromycin (BIAXIN XL) 500 MG 24 hr tablet Take 2 tablets (1,000 mg total) by mouth daily. 5 tablet 0  . Misc Natural Products (TART CHERRY ADVANCED) CAPS Take 100 capsules by mouth daily.    . Glucosamine-Chondroitin (GLUCOSAMINE CHONDR COMPLEX PO) Take 1 capsule by mouth daily.    [name] OVER THE COUNTER MEDICATION Take 1 capsule by mouth daily. Ginger 300mg /Turmeric 300mg      No facility-administered medications prior to visit.      Review of Systems  Constitutional: Positive for fever. Negative for chills.  HENT: Positive for congestion. Negative for sinus pain, sinus pressure and sore throat.   Respiratory: Positive for cough and wheezing. Negative for chest tightness and shortness of breath.   Neurological: Negative for headaches.      Objective:    BP 136/86 (BP Location: Left Arm, Patient Position: Sitting, Cuff Size: Normal)   Pulse 78   Temp 98.6 F (37 C) (Oral)   Resp 16  Ht 5\' 5"  (1.651 m)   Wt 137 lb (62.1 kg)   SpO2 94%   BMI 22.80 kg/m  Nursing note and vital signs reviewed.  Physical Exam  Constitutional: She is oriented to person, place, and time. She appears well-developed and well-nourished. No distress.  HENT:  Right Ear: Hearing, tympanic membrane, external ear and ear canal normal.  Left Ear: Hearing, tympanic membrane, external ear and ear canal normal.  Nose: Nose normal.  Mouth/Throat: Uvula is midline, oropharynx is clear and moist and mucous membranes are normal.  Cardiovascular: Normal rate, regular rhythm, normal heart sounds and intact distal pulses.     Pulmonary/Chest: Effort normal and breath sounds normal. No respiratory distress. She has no wheezes. She has no rales. She exhibits no tenderness.  Neurological: She is alert and oriented to person, place, and time.  Skin: Skin is warm and dry.  Psychiatric: She has a normal mood and affect. Her behavior is normal. Judgment and thought content normal.       Assessment & Plan:   Problem List Items Addressed This Visit      Respiratory   Respiratory infection    Symptoms and exam consistent with acute upper respiratory infection most likely viral. Recommend continued watchful waiting as pulmonary exam is normal. Continue over-the-counter medications for symptom relief and supportive care. Start promethazine-codeine as needed for cough and sleep. Additional clarithromycin provided to complete dose if needed.      Relevant Medications   clarithromycin (BIAXIN XL) 500 MG 24 hr tablet       I have discontinued Ms. Duhe's TART CHERRY ADVANCED, OVER THE COUNTER MEDICATION, and Glucosamine-Chondroitin (GLUCOSAMINE CHONDR COMPLEX PO). I am also having her start on promethazine-codeine. Additionally, I am having her maintain her PHOSPHATIDYLSERINE PO, VOLTAREN, Biotin, CORAL CALCIUM PO, Omega-3 Fatty Acids (ULTRA OMEGA 3 PO), vitamin C, ALPRAZolam, Vitamin D3, Coenzyme Q10, cyclobenzaprine, ibuprofen, traMADol, and clarithromycin.   Meds ordered this encounter  Medications  . clarithromycin (BIAXIN XL) 500 MG 24 hr tablet    Sig: Take 2 tablets (1,000 mg total) by mouth daily.    Dispense:  9 tablet    Refill:  0    Order Specific Question:   Supervising Provider    Answer:   Pricilla Holm A [id number]  . promethazine-codeine (PHENERGAN WITH CODEINE) 6.25-10 MG/5ML syrup    Sig: Take 5 mLs by mouth every 6 (six) hours as needed for cough.    Dispense:  180 mL    Refill:  0    Order Specific Question:   Supervising Provider    Answer:   Pricilla Holm A [id number]      Follow-up: Return if symptoms worsen or fail to improve.  Mauricio Po, FNP

## 2016-05-29 DIAGNOSIS — F4323 Adjustment disorder with mixed anxiety and depressed mood: Secondary | ICD-10-CM | POA: Diagnosis not present

## 2016-06-03 DIAGNOSIS — M19042 Primary osteoarthritis, left hand: Secondary | ICD-10-CM | POA: Diagnosis not present

## 2016-06-03 DIAGNOSIS — F5104 Psychophysiologic insomnia: Secondary | ICD-10-CM | POA: Diagnosis not present

## 2016-06-03 DIAGNOSIS — M19041 Primary osteoarthritis, right hand: Secondary | ICD-10-CM | POA: Diagnosis not present

## 2016-06-03 DIAGNOSIS — M2550 Pain in unspecified joint: Secondary | ICD-10-CM | POA: Diagnosis not present

## 2016-06-24 ENCOUNTER — Encounter: Payer: Self-pay | Admitting: Internal Medicine

## 2016-06-24 ENCOUNTER — Ambulatory Visit (INDEPENDENT_AMBULATORY_CARE_PROVIDER_SITE_OTHER): Payer: BLUE CROSS/BLUE SHIELD | Admitting: Internal Medicine

## 2016-06-24 DIAGNOSIS — F439 Reaction to severe stress, unspecified: Secondary | ICD-10-CM

## 2016-06-24 NOTE — Progress Notes (Signed)
Pre visit review using our clinic review tool, if applicable. No additional management support is needed unless otherwise documented below in the visit note. 

## 2016-06-24 NOTE — Progress Notes (Signed)
Subjective:  Patient ID: Rebecca Marks, female    DOB: 07/25/1955  Age: 61 y.o. MRN: [place]:[id number]  CC: No chief complaint on file.   HPI Rebecca Marks presents for severe GERD last week. Cimetidine OTC has helped F/u depression - has not started Cymbalta yet  Outpatient Medications Prior to Visit  Medication Sig Dispense Refill  . ALPRAZolam (XANAX) 0.5 MG tablet Take 0.5 mg by mouth at bedtime as needed for anxiety or sleep.   1  . Ascorbic Acid (VITAMIN C) 1000 MG tablet Take 2,000 mg by mouth 2 (two) times daily.     . Biotin 5000 MCG CAPS Take 1 capsule by mouth daily.    . Cholecalciferol (VITAMIN D3) 2000 UNITS TABS Take 2 tablets by mouth daily.    . Coenzyme Q10 200 MG capsule Take 200 mg by mouth daily.    [name] CORAL CALCIUM PO Take by mouth. 1000mg -3000mg  daily.    . cyclobenzaprine (FLEXERIL) 10 MG tablet Take 7.5 mg by mouth at bedtime.    [name] ibuprofen (ADVIL,MOTRIN) 200 MG tablet Take 800 mg by mouth daily.     . Omega-3 Fatty Acids (ULTRA OMEGA 3 PO) Take by mouth. 1-2 grams daily.    [name] PHOSPHATIDYLSERINE PO Take 300 mg by mouth daily.     . traMADol (ULTRAM) 50 MG tablet Take 25 mg by mouth at bedtime as needed.    . VOLTAREN 1 % GEL Apply 1 application topically at bedtime. Applied to knee, hand, fingers.    . promethazine-codeine (PHENERGAN WITH CODEINE) 6.25-10 MG/5ML syrup Take 5 mLs by mouth every 6 (six) hours as needed for cough. (Patient not taking: Reported on 06/24/2016) 180 mL 0   No facility-administered medications prior to visit.     ROS Review of Systems  Constitutional: Negative for activity change, appetite change, chills, fatigue and unexpected weight change.  HENT: Negative for congestion, mouth sores and sinus pressure.   Eyes: Negative for visual disturbance.  Respiratory: Negative for cough and chest tightness.   Gastrointestinal: Negative for abdominal pain and nausea.  Genitourinary: Negative for difficulty urinating, frequency and vaginal  pain.  Musculoskeletal: Negative for back pain and gait problem.  Skin: Negative for pallor and rash.  Neurological: Negative for dizziness, tremors, weakness, numbness and headaches.  Psychiatric/Behavioral: Negative for confusion and sleep disturbance.    Objective:  BP 124/72   Temp 99.4 F (37.4 C) (Oral)   Resp 20   Wt 134 lb 6 oz (61 kg)   BMI 22.36 kg/m   BP Readings from Last 3 Encounters:  06/24/16 124/72  05/20/16 136/86  05/16/16 124/84    Wt Readings from Last 3 Encounters:  06/24/16 134 lb 6 oz (61 kg)  05/20/16 137 lb (62.1 kg)  05/16/16 132 lb 6.4 oz (60.1 kg)    Physical Exam  Constitutional: She appears well-developed. No distress.  HENT:  Head: Normocephalic.  Right Ear: External ear normal.  Left Ear: External ear normal.  Nose: Nose normal.  Mouth/Throat: Oropharynx is clear and moist.  Eyes: Conjunctivae are normal. Pupils are equal, round, and reactive to light. Right eye exhibits no discharge. Left eye exhibits no discharge.  Neck: Normal range of motion. Neck supple. No JVD present. No tracheal deviation present. No thyromegaly present.  Cardiovascular: Normal rate, regular rhythm and normal heart sounds.   Pulmonary/Chest: No stridor. No respiratory distress. She has no wheezes.  Abdominal: Soft. Bowel sounds are normal. She exhibits no distension and no mass. There is  no tenderness. There is no rebound and no guarding.  Musculoskeletal: She exhibits no edema or tenderness.  Lymphadenopathy:    She has no cervical adenopathy.  Neurological: She displays normal reflexes. No cranial nerve deficit. She exhibits normal muscle tone. Coordination normal.  Skin: No rash noted. No erythema.  Psychiatric: She has a normal mood and affect. Her behavior is normal. Judgment and thought content normal.    Lab Results  Component Value Date   WBC 7.0 02/12/2016   HGB 14.0 02/12/2016   HCT 39.8 02/12/2016   PLT 234.0 02/12/2016   GLUCOSE 92 02/12/2016     CHOL 152 02/12/2016   TRIG 172.0 (H) 02/12/2016   HDL 37.40 (L) 02/12/2016   LDLCALC 80 02/12/2016   ALT 4 02/12/2016   AST 15 02/12/2016   NA 140 02/12/2016   K 4.3 02/12/2016   CL 105 02/12/2016   CREATININE 0.75 02/12/2016   BUN 15 02/12/2016   CO2 30 02/12/2016   TSH 1.63 02/12/2016   INR 1.0 09/12/2015   HGBA1C 4.4 (L) 07/30/2006    No results found.  Assessment & Plan:   There are no diagnoses linked to this encounter. I am having Ms. Gaspar maintain her PHOSPHATIDYLSERINE PO, VOLTAREN, Biotin, CORAL CALCIUM PO, Omega-3 Fatty Acids (ULTRA OMEGA 3 PO), vitamin C, ALPRAZolam, Vitamin D3, Coenzyme Q10, cyclobenzaprine, ibuprofen, traMADol, and promethazine-codeine.  No orders of the defined types were placed in this encounter.    Follow-up: No Follow-up on file.  Walker Kehr, MD

## 2016-06-24 NOTE — Patient Instructions (Signed)
Try TRE - Trauma Release Exercise  You can get a "Stress Less TRE" application 

## 2016-06-24 NOTE — Assessment & Plan Note (Signed)
Try TRE - Trauma Release Exercise  You can get a "Stress Less TRE" application 

## 2016-06-28 DIAGNOSIS — J069 Acute upper respiratory infection, unspecified: Secondary | ICD-10-CM | POA: Diagnosis not present

## 2016-06-30 ENCOUNTER — Encounter: Payer: Self-pay | Admitting: Internal Medicine

## 2016-07-01 ENCOUNTER — Ambulatory Visit (INDEPENDENT_AMBULATORY_CARE_PROVIDER_SITE_OTHER): Payer: BLUE CROSS/BLUE SHIELD | Admitting: Internal Medicine

## 2016-07-01 ENCOUNTER — Encounter: Payer: Self-pay | Admitting: Internal Medicine

## 2016-07-01 ENCOUNTER — Telehealth: Payer: Self-pay | Admitting: Internal Medicine

## 2016-07-01 VITALS — BP 112/84 | HR 60 | Temp 98.7°F | Resp 16 | Ht 65.0 in | Wt 132.8 lb

## 2016-07-01 DIAGNOSIS — S060X9A Concussion with loss of consciousness of unspecified duration, initial encounter: Secondary | ICD-10-CM | POA: Insufficient documentation

## 2016-07-01 DIAGNOSIS — R55 Syncope and collapse: Secondary | ICD-10-CM | POA: Diagnosis not present

## 2016-07-01 DIAGNOSIS — S060XAA Concussion with loss of consciousness status unknown, initial encounter: Secondary | ICD-10-CM | POA: Insufficient documentation

## 2016-07-01 DIAGNOSIS — S060X1A Concussion with loss of consciousness of 30 minutes or less, initial encounter: Secondary | ICD-10-CM

## 2016-07-01 DIAGNOSIS — R509 Fever, unspecified: Secondary | ICD-10-CM | POA: Insufficient documentation

## 2016-07-01 LAB — POC URINALSYSI DIPSTICK (AUTOMATED)
Bilirubin, UA: NEGATIVE
Blood, UA: POSITIVE
Glucose, UA: NEGATIVE
Ketones, UA: NEGATIVE
Nitrite, UA: POSITIVE
PH UA: 6
PROTEIN UA: POSITIVE
Spec Grav, UA: 1.025
Urobilinogen, UA: 0.2

## 2016-07-01 MED ORDER — CEFUROXIME AXETIL 250 MG PO TABS: 250.0000 mg | ORAL_TABLET | Freq: Two times a day (BID) | ORAL | 0 refills | Status: DC

## 2016-07-01 NOTE — Telephone Encounter (Signed)
Please schedule for 4:15 today per dr plotnikov---thanks---routing to tammy to call patient and schedule

## 2016-07-01 NOTE — Assessment & Plan Note (Addendum)
CT head tomorrow Rest Treat UTI Labs if needed Info given RTC 4 wks

## 2016-07-01 NOTE — Patient Instructions (Signed)
Concussion, Adult A concussion, or closed-head injury, is a brain injury caused by a direct blow to the head or by a quick and sudden movement (jolt) of the head or neck. Concussions are usually not life-threatening. Even so, the effects of a concussion can be serious. If you have had a concussion before, you are more likely to experience concussion-like symptoms after a direct blow to the head. What are the causes? This condition is caused by:  Direct blow to the head, such as from running into another player during a soccer game, being hit in a fight, or hitting your head on a hard surface.  A jolt of the head or neck that causes the brain to move back and forth inside the skull, such as in a car crash. What are the signs or symptoms? The signs of a concussion can be hard to notice. Early on, they may be missed by you, family members, and health care providers. You may look fine but act or feel differently. Symptoms are usually temporary, but they may last for days, weeks, or even longer. Some symptoms may appear right away while others may not show up for hours or days. Every head injury is different. Symptoms may include:  Mild to moderate headaches that will not go away.  A feeling of pressure inside your head.  Having more trouble than usual:  Learning or remembering things you have heard.  Answering questions.  Paying attention or concentrating.  Organizing daily tasks.  Making decisions and solving problems.  Slowness in thinking, acting or reacting, speaking, or reading.  Getting lost or being easily confused.  Feeling tired all the time or lacking energy (fatigued).  Feeling drowsy.  Sleep disturbances.  Sleeping more than usual.  Sleeping less than usual.  Trouble falling asleep.  Trouble sleeping (insomnia).  Loss of balance or feeling lightheaded or dizzy.  Nausea or vomiting.  Numbness or tingling.  Increased sensitivity  to:  Sounds.  Lights.  Distractions.  Vision problems or eyes that tire easily.  Diminished sense of taste or smell.  Ringing in the ears.  Mood changes such as feeling sad or anxious.  Becoming easily irritated or angry for little or no reason.  Lack of motivation.  Seeing or hearing things other people do not see or hear (hallucinations). How is this diagnosed? This condition is diagnosed based on:  Medical history. Your health care provider will ask for a description of your activity and your injury.  Symptoms. Your health care provider will ask whether you passed out (lost consciousness) and whether you are having trouble remembering events that happened right before and during your injury. You may also have other tests, including:  A brain scan to look for signs of injury to the brain. You may still have a concussion even if the scan shows no injury.  Blood tests to be sure other problems are not present. How is this treated? This condition is usually treated in an emergency department, in an urgent care, or at a clinic.  Tell your health care provider if:  You are taking any medicines, including prescription medicines, over-the-counter medicines, and natural remedies. Some medicines, such as blood thinners (anticoagulants) and aspirin, may increase the chance of complications, such as bleeding.  You are taking or have taken alcohol or illegal drugs. Alcohol and certain other drugs may slow your recovery and can put you at risk of further injury.  Your health care provider will send you home with important instructions  to follow.  How fast you will recover from a concussion depends on many factors, such as how severe your concussion is, what part of your brain was injured, how old you are, and how healthy you were before the concussion. Recovery can take time.  Most people with mild injuries recover fully.  Recovery is slower in older people.  People who have had  a concussion in the past or have other medical problems may take longer to recover. Follow these instructions at home: Activity  Limit activities that require a lot of thought or concentration. These include:  Doing homework or job-related work.  Watching TV.  Working on the computer.  Rest. Rest helps the brain to heal. Make sure you:  Get plenty of sleep at night. Avoid staying up late at night.  Keep the same bedtime hours on weekends and weekdays.  Rest during the day. Take daytime naps or rest breaks when you feel tired.  Avoid situations that increase your risk for another head injury (football, hockey, soccer, basketball, martial arts, downhill snow sports, and horseback riding). Your condition will get worse every time you experience a concussion.  Ask your health care provider when you can return to your normal activities, such as school, work, athletics, driving, riding a bicycle, or operating heavy machinery. Your ability to react may be slower after a brain injury. Never do these activities if you are dizzy.  Return to your normal activities slowly, not all at once. You must give your body and brain enough time for recovery. Preventing another concussion  It is very important to avoid another brain injury, especially as you recover. In rare cases, another injury can lead to permanent brain damage, brain swelling, or death. The risk of this is greatest during the first 7-10 days after a head injury. Avoid injuries by:  Wearing a seat belt when riding in a car.  Limiting alcohol intake to no more than 1 drink a day for non-pregnant women and 2 drinks a day for men. One drink equals 12 oz. of beer, 5 oz. of wine, or 1 oz. of hard liquor.  Wearing a helmet when biking, skiing, skateboarding, skating, or doing similar activities.  Avoiding activities that could lead to a second concussion, such as contact or recreational sports, until your health care provider says it is  okay.  Taking safety measures in your home.  Remove clutter and tripping hazards from floors and stairways.  Use grab bars in bathrooms and handrails by stairs.  Place non-slip mats on floors and in bathtubs.  Improve lighting in dim areas. General instructions  Take over-the-counter and prescription medicines only as told by your health care provider.  Do not drink alcohol until your health care provider says you are well enough to do so.  If it is harder than usual to remember things, write them down.  If you are easily distracted, try to do one thing at a time. For example, do not try to watch TV while fixing dinner.  Talk with family members or close friends when making important decisions.  Watch your symptoms and tell others to do the same. Complications sometimes occur after a concussion. Older adults with a brain injury may have a higher risk of serious complications, such as a blood clot on the brain.  Tell your teachers, school nurse, school counselor, coach, athletic trainer, or work Freight forwarder about your injury, symptoms, and restrictions. Tell them about what you can or cannot do. They should watch  for:  Increased problems with attention or concentration.  Increased difficulty remembering or learning new information.  Increased time needed to complete tasks or assignments.  Increased irritability or decreased ability to cope with stress.  Increased symptoms.  Keep all follow-up visits as told by your health care provider. This is important because repeated evaluation of your symptoms is recommended for your recovery. Contact a health care provider if:  You have increased problems paying attention or concentrating.  You have increased difficulty remembering or learning new information.  You need more time to complete tasks or assignments than before.  You have increased irritability or decreased ability to cope with stress.  You have more symptoms than  before. Seek medical care if you have any of the following symptoms for more than 2 weeks after your injury:  Long-lasting (chronic) headaches.  Dizziness or balance problems.  Nausea.  Vision problems.  Increased sensitivity to noise or light.  Depression or mood swings.  Anxiety or irritability.  Memory problems.  Difficulty concentrating or paying attention.  Sleep problems.  Feeling tired all the time. Get help right away if:  You have severe or worsening headaches. These may be a sign of a blood clot in the brain.  You have weakness (even if only in one hand, leg, or part of the face).  You have numbness.  You have decreased coordination.  You vomit repeatedly.  You have increased sleepiness.  One pupil is larger than the other.  You have convulsions.  You have slurred speech.  You have increased confusion. This may be a sign of a blood clot in the brain.  You have increased restlessness, agitation, or irritability.  You are unable to recognize people or places.  You have neck pain.  It is difficult to wake you up.  You have unusual behavior changes.  You lose consciousness. This information is not intended to replace advice given to you by your health care provider. Make sure you discuss any questions you have with your health care provider. Document Released: 07/26/2003 Document Revised: 10/11/2015 Document Reviewed: 11/25/2012 Elsevier Interactive Patient Education  2017 [employer].

## 2016-07-01 NOTE — Assessment & Plan Note (Addendum)
2/18 vaso-vagal Treat UTI EKG

## 2016-07-01 NOTE — Telephone Encounter (Signed)
Got patient scheduled.  Patient notified.

## 2016-07-01 NOTE — Progress Notes (Signed)
Pre-visit discussion using our clinic review tool. No additional management support is needed unless otherwise documented below in the visit note.  

## 2016-07-01 NOTE — Progress Notes (Signed)
Subjective:  Patient ID: Rebecca Rebecca Marks, female    DOB: 09-17-1955  Age: 61 y.o. MRN: [place]:[id number]  CC: Head Injury (fell hit head tile floor 2.5 days 4:45 am, faiting )   HPI Rebecca Rebecca Marks presents for HA, syncope on Sun - 4 am. Rebecca Rebecca Marks passed out in the bathroom, passed out and hit a tile floor with the back of her head. LOC ?3-5 min. Hasband started to give her mouth to mouth because she wouldn't wake up - she came around. No n/v. No seizures. No incontinence. H/o passing out before  Per the email:  "I managed to get a nasty virus over the weekend, fainted and fell in my bathroom, hit the back of my head and lost consciousness for a short time that being 15-20 seconds.There is no exterior hematoma at the back of my head, but I did hit with enough force to cause the front part of my head to hurt (rebound). There are no neurological deficits that I can see.  I know I will not drive for a few days and I will rest because I still feel bad from the weekend. All in all, I feel Rebecca Marks. I will tell you that reading a book is difficult just as it was last time I was diagnosed with concussion and I think that was with Dr. Linna Darner. I have given Nicole Kindred so many frights that I promised him I would let you know to see if there is any reason to do any further checking. "     Outpatient Medications Prior to Visit  Medication Sig Dispense Refill  . Ascorbic Acid (VITAMIN C) 1000 MG tablet Take 2,000 mg by mouth 2 (two) times daily.     . Biotin 5000 MCG CAPS Take 1 capsule by mouth daily.    . Cholecalciferol (VITAMIN D3) 2000 UNITS TABS Take 2 tablets by mouth daily.    . Coenzyme Q10 200 MG capsule Take 200 mg by mouth daily.    [name] CORAL CALCIUM PO Take by mouth. 1000mg -3000mg  daily.    . cyclobenzaprine (FLEXERIL) 10 MG tablet Take 7.5 mg by mouth at bedtime.    [name] ibuprofen (ADVIL,MOTRIN) 200 MG tablet Take 800 mg by mouth daily.     . Omega-3 Fatty Acids (ULTRA OMEGA 3 PO) Take by mouth. 1-2 grams  daily.    [name] PHOSPHATIDYLSERINE PO Take 300 mg by mouth daily.     . traMADol (ULTRAM) 50 MG tablet Take 25 mg by mouth at bedtime as needed.    . VOLTAREN 1 % GEL Apply 1 application topically at bedtime. Applied to knee, hand, fingers.    . ALPRAZolam (XANAX) 0.5 MG tablet Take 0.5 mg by mouth at bedtime as needed for anxiety or sleep.   1   No facility-administered medications prior to visit.     ROS Review of Systems  Constitutional: Positive for chills, fatigue and fever. Negative for activity change, appetite change and unexpected weight change.  HENT: Negative for congestion, mouth sores and sinus pressure.   Eyes: Negative for visual disturbance.  Respiratory: Negative for cough and chest tightness.   Gastrointestinal: Negative for abdominal pain and nausea.  Genitourinary: Positive for dysuria. Negative for difficulty urinating, frequency and vaginal pain.  Musculoskeletal: Negative for back pain and gait problem.  Skin: Negative for pallor and rash.  Neurological: Positive for weakness, light-headedness and headaches. Negative for dizziness, tremors and numbness.  Psychiatric/Behavioral: Negative for confusion and sleep disturbance.    Objective:  BP 112/84  Pulse 60   Temp 98.7 F (37.1 C) (Oral)   Resp 16   Ht 5\' 5"  (1.651 m)   Wt 132 lb 12 oz (60.2 kg)   BMI 22.09 kg/m   BP Readings from Last 3 Encounters:  07/01/16 112/84  06/24/16 124/72  05/20/16 136/86    Wt Readings from Last 3 Encounters:  07/01/16 132 lb 12 oz (60.2 kg)  06/24/16 134 lb 6 oz (61 kg)  05/20/16 137 lb (62.1 kg)    Physical Exam  Constitutional: She appears well-developed. No distress.  HENT:  Head: Normocephalic.  Right Ear: External ear normal.  Left Ear: External ear normal.  Nose: Nose normal.  Mouth/Throat: Oropharynx is clear and moist.  Eyes: Conjunctivae are normal. Pupils are equal, round, and reactive to light. Right eye exhibits no discharge. Left eye exhibits no  discharge.  Neck: Normal range of motion. Neck supple. No JVD present. No tracheal deviation present. No thyromegaly present.  Cardiovascular: Normal rate, regular rhythm and normal heart sounds.   Pulmonary/Chest: No stridor. No respiratory distress. She has no wheezes.  Abdominal: Soft. Bowel sounds are normal. She exhibits no distension and no mass. There is no tenderness. There is no rebound and no guarding.  Musculoskeletal: She exhibits no edema or tenderness.  Lymphadenopathy:    She has no cervical adenopathy.  Neurological: She displays normal reflexes. No cranial nerve deficit. She exhibits normal muscle tone. Coordination normal.  Skin: No rash noted. No erythema.  Psychiatric: She has a normal mood and affect. Her behavior is normal. Judgment and thought content normal.  occip scull is a little tender Neck NT w/good ROM   Procedure: EKG Indication: LOC Impression: NSR. No acute changes.  Lab Results  Component Value Date   WBC 7.0 02/12/2016   HGB 14.0 02/12/2016   HCT 39.8 02/12/2016   PLT 234.0 02/12/2016   GLUCOSE 92 02/12/2016   CHOL 152 02/12/2016   TRIG 172.0 (H) 02/12/2016   HDL 37.40 (L) 02/12/2016   LDLCALC 80 02/12/2016   ALT 4 02/12/2016   AST 15 02/12/2016   NA 140 02/12/2016   K 4.3 02/12/2016   CL 105 02/12/2016   CREATININE 0.75 02/12/2016   BUN 15 02/12/2016   CO2 30 02/12/2016   TSH 1.63 02/12/2016   INR 1.0 09/12/2015   HGBA1C 4.4 (L) 07/30/2006    No results found.  Assessment & Plan:   There are no diagnoses linked to this encounter. I have discontinued Ms. Freeland's ALPRAZolam. I am also having her maintain her PHOSPHATIDYLSERINE PO, VOLTAREN, Biotin, CORAL CALCIUM PO, Omega-3 Fatty Acids (ULTRA OMEGA 3 PO), vitamin C, Vitamin D3, Coenzyme Q10, cyclobenzaprine, ibuprofen, traMADol, and DULoxetine.  Meds ordered this encounter  Medications  . DULoxetine (CYMBALTA) 20 MG capsule    Sig: Take 20 mg by mouth daily.      Follow-up: No Follow-up on file.  Walker Kehr, MD

## 2016-07-01 NOTE — Telephone Encounter (Signed)
Pt called in and said that she would like to be worked in today.  Pt fell in bathroom and Plot stated that he wanted to see her.

## 2016-07-01 NOTE — Assessment & Plan Note (Addendum)
UA --- UTI Poss pyelo Treat w/Ceftin

## 2016-07-02 ENCOUNTER — Other Ambulatory Visit: Payer: BLUE CROSS/BLUE SHIELD

## 2016-07-02 ENCOUNTER — Ambulatory Visit (INDEPENDENT_AMBULATORY_CARE_PROVIDER_SITE_OTHER)
Admission: RE | Admit: 2016-07-02 | Discharge: 2016-07-02 | Disposition: A | Discharge status: Home / Self Care | Payer: BLUE CROSS/BLUE SHIELD | Source: Ambulatory Visit | Attending: Internal Medicine | Admitting: Internal Medicine

## 2016-07-02 DIAGNOSIS — S060X1A Concussion with loss of consciousness of 30 minutes or less, initial encounter: Secondary | ICD-10-CM

## 2016-07-02 DIAGNOSIS — R55 Syncope and collapse: Secondary | ICD-10-CM | POA: Diagnosis not present

## 2016-07-02 DIAGNOSIS — S0990XA Unspecified injury of head, initial encounter: Secondary | ICD-10-CM | POA: Diagnosis not present

## 2016-07-02 NOTE — Addendum Note (Signed)
Addended by: Karle Barr on: 07/02/2016 09:18 AM   Modules accepted: Orders

## 2016-07-04 LAB — CULTURE, URINE COMPREHENSIVE

## 2016-07-23 ENCOUNTER — Encounter: Payer: Self-pay | Admitting: Internal Medicine

## 2016-07-23 ENCOUNTER — Ambulatory Visit (INDEPENDENT_AMBULATORY_CARE_PROVIDER_SITE_OTHER): Payer: BLUE CROSS/BLUE SHIELD | Admitting: Family Medicine

## 2016-07-23 ENCOUNTER — Other Ambulatory Visit (INDEPENDENT_AMBULATORY_CARE_PROVIDER_SITE_OTHER): Payer: BLUE CROSS/BLUE SHIELD

## 2016-07-23 ENCOUNTER — Ambulatory Visit (INDEPENDENT_AMBULATORY_CARE_PROVIDER_SITE_OTHER): Payer: BLUE CROSS/BLUE SHIELD | Admitting: Internal Medicine

## 2016-07-23 DIAGNOSIS — S060X1D Concussion with loss of consciousness of 30 minutes or less, subsequent encounter: Secondary | ICD-10-CM | POA: Diagnosis not present

## 2016-07-23 DIAGNOSIS — S060X1A Concussion with loss of consciousness of 30 minutes or less, initial encounter: Secondary | ICD-10-CM

## 2016-07-23 DIAGNOSIS — R55 Syncope and collapse: Secondary | ICD-10-CM | POA: Diagnosis not present

## 2016-07-23 DIAGNOSIS — R400 Somnolence: Secondary | ICD-10-CM | POA: Diagnosis not present

## 2016-07-23 DIAGNOSIS — F0781 Postconcussional syndrome: Secondary | ICD-10-CM | POA: Diagnosis not present

## 2016-07-23 DIAGNOSIS — F439 Reaction to severe stress, unspecified: Secondary | ICD-10-CM

## 2016-07-23 DIAGNOSIS — B182 Chronic viral hepatitis C: Secondary | ICD-10-CM | POA: Diagnosis not present

## 2016-07-23 LAB — URINALYSIS, ROUTINE W REFLEX MICROSCOPIC
Bilirubin Urine: NEGATIVE
Hgb urine dipstick: NEGATIVE
Ketones, ur: NEGATIVE
Leukocytes, UA: NEGATIVE
Nitrite: NEGATIVE
PH: 6.5 (ref 5.0–8.0)
RBC / HPF: NONE SEEN (ref 0–?)
Specific Gravity, Urine: <=1.005 — AB (ref 1.000–1.030)
Total Protein, Urine: NEGATIVE
Urine Glucose: NEGATIVE
Urobilinogen, UA: 0.2 (ref 0.0–1.0)
WBC UA: NONE SEEN (ref 0–?)

## 2016-07-23 MED ORDER — PHOSPHATIDYLSERINE-DHA-EPA 100-19.5-6.5 MG PO CAPS: 1.0000 | ORAL_CAPSULE | Freq: Every day | ORAL | 11 refills | Status: DC

## 2016-07-23 NOTE — Assessment & Plan Note (Signed)
No relapse  Head CT OK

## 2016-07-23 NOTE — Assessment & Plan Note (Signed)
Discussed.

## 2016-07-23 NOTE — Assessment & Plan Note (Signed)
Will d/c cymbalta

## 2016-07-23 NOTE — Progress Notes (Signed)
[name] [address] [place], [place] [address] Phone: [phone] Subjective:    I'm seeing this patient by the request  of:    CC: head injury possible concussion.   IOM:[id number]  Rebecca Marks is a 61 y.o. female coming in with complaint of Head injury. Patient states that this started a proximal weight 4 weeks ago. Patient did fall. Possible syncopal episode. Patient states that she may have passed out in the bathroom. Possible loss of consciousness of 3-5 minutes. Patient was found by her husband who did state he later to wake up. Patient states unfortunately she continues to have headache, increasing fatigue, sometimes visual changes, significant fatigue. Past medical history significant for possible concussion previously.    Past Medical History:  Diagnosis Date  . Allergy   . Anxiety   . Arthritis    R wrist and R knee  . CAP (community acquired pneumonia) 4/17-22/15   Rochephin & IV Zithromax & 3 days Levaquin  . Chronic back pain   . DVT (deep venous thrombosis) ([place])    a. in her 20s while on OCPs.  . Hepatitis C    contaminated blood @ her job, prior notes indicate spontaneously cleared  . History of tobacco abuse   . Osteoarthritis   . Palpitations   . Premature atrial contractions    a. Holter 11/2013: few PACs.  . Pulmonary function study abnormality    a. 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  . Raynaud phenomenon   . Ruptured lumbar disc    4 herniated disc   . Volume overload    a.  history of volume overload during hospital stay for PNA in 08/2013 felt iatrogenic due to receiving a lot of IV fluid. 2D echo 08/2013 showed EF 45-36%, normal diastolic function parameters.   Past Surgical History:  Procedure Laterality Date  . CHOLECYSTECTOMY  1985   Social History   Social History  . Marital status: Married    Spouse name: N/A  . Number of children: N/A  . Years of education: N/A   Social  History Main Topics  . Smoking status: Former Smoker    Packs/day: 0.25    Years: 25.00    Types: Cigarettes    Quit date: 08/19/2013  . Smokeless tobacco: Never Used     Comment: smoked 1979-2008, up to 1 ppd. As of 08/01/13 1 cig every 2 weeks  . Alcohol use No  . Drug use: No     Comment: History-long time ago  . Sexual activity: Yes    Birth control/ protection: None   Other Topics Concern  . Not on file   Social History Narrative  . No narrative on file   No Known Allergies Family History  Problem Relation Age of Onset  . Diabetes Father   . Hypertension Father   . Heart attack Father     >55  . Lymphoma Father   . Stroke Mother 77    after bedridden post fall with BLE paralysis  . Breast cancer Mother      X 3  . Breast cancer Sister   . Alzheimer's disease Maternal Grandmother   . Stroke Maternal Grandfather     in 35s  . Colon cancer Neg Hx     Past medical history, social, surgical and family history all reviewed in electronic medical record.  No pertanent information unless stated regarding to the chief complaint.   Review of Systems:Review of systems  updated and as accurate as of 07/23/16  No headache, visual changes, nausea, vomiting, diarrhea, constipation, dizziness, abdominal pain, skin rash, fevers, chills, night sweats, weight loss, swollen lymph nodes, body aches, joint swelling, muscle aches, chest pain, shortness of breath, mood changes.   Objective  Height 5\' 3"  (1.6 m). Systems examined below as of 07/23/16   General: No apparent distress alert and oriented x3 mood and affect normal, dressed appropriately.  HEENT: Pupils equal, extraocular movements intact  Respiratory: Patient's speak in full sentences and does not appear short of breath  Cardiovascular: No lower extremity edema, non tender, no erythema  Skin: Warm dry intact with no signs of infection or rash on extremities or on axial skeleton.  Abdomen: Soft nontender  Neuro: Cranial nerves  II through XII are intact, neurovascularly intact in all extremities with 2+ DTRs and 2+ pulses.  Lymph: No lymphadenopathy of posterior or anterior cervical chain or axillae bilaterally.  Gait normal with good balance and coordination.  MSK:  Non tender with full range of motion and good stability and symmetric strength and tone of shoulders, elbows, wrist, hip, knee and ankles bilaterally. Mild arthritic changes of multiple joints Neck: Inspection unremarkable. No palpable stepoffs. Negative Spurling's maneuver. Full neck range of motion Grip strength and sensation normal in bilateral hands Strength good C4 to T1 distribution No sensory change to C4 to T1 Negative Hoffman sign bilaterally Reflexes normal  Impact testing shows the patient does have what appears to be a postconcussive symptoms with composite scores less than 50% of expected. Please see scanned sheet for rest of the information.   Neck exam : Neck: Inspection unremarkable. No palpable stepoffs. Negative Spurling's maneuver. Full neck range of motion Grip strength and sensation normal in bilateral hands Strength good C4 to T1 distribution No sensory change to C4 to T1 Negative Hoffman sign bilaterally Reflexes normal  Symptom score? (22)     16 Severity score ? (132)    87   Oirentation?(5)  5 M / D/ W / Y /  Time      Impression and Recommendations:     This case required medical decision making of moderate complexity.      Note: This dictation was prepared with Dragon dictation along with smaller phrase technology. Any transcriptional errors that result from this process are unintentional.

## 2016-07-23 NOTE — Progress Notes (Signed)
Subjective:  Patient ID: Rebecca Marks, female    DOB: Aug 02, 1955  Age: 61 y.o. MRN: [id number]  CC: Head Injury (slow doing things, fatigue, needs to take naps, doesn't want to leave house)   HPI [facility] presents for a post-concussion syndrome, stress. C/o somnolence. Stopped Cymbalta 1-2 weeks ago. Feeling less tired, however gets exhausted w/minimal activity (mental or physical)  Outpatient Medications Prior to Visit  Medication Sig Dispense Refill  . Ascorbic Acid (VITAMIN C) 1000 MG tablet Take 2,000 mg by mouth 2 (two) times daily.     . Biotin 5000 MCG CAPS Take 1 capsule by mouth daily.    . Cholecalciferol (VITAMIN D3) 2000 UNITS TABS Take 2 tablets by mouth daily.    . Coenzyme Q10 200 MG capsule Take 200 mg by mouth daily.    [name] CORAL CALCIUM PO Take by mouth. 1000mg -3000mg  daily.    . cyclobenzaprine (FLEXERIL) 10 MG tablet Take 7.5 mg by mouth at bedtime.    [name] ibuprofen (ADVIL,MOTRIN) 200 MG tablet Take 800 mg by mouth daily.     . Omega-3 Fatty Acids (ULTRA OMEGA 3 PO) Take by mouth. 1-2 grams daily.    [name] PHOSPHATIDYLSERINE PO Take 300 mg by mouth daily.     . traMADol (ULTRAM) 50 MG tablet Take 25 mg by mouth at bedtime as needed.    . VOLTAREN 1 % GEL Apply 1 application topically at bedtime. Applied to knee, hand, fingers.    . DULoxetine (CYMBALTA) 20 MG capsule Take 20 mg by mouth daily.     No facility-administered medications prior to visit.     ROS Review of Systems  Constitutional: Positive for fatigue. Negative for activity change, appetite change, chills and unexpected weight change.  HENT: Negative for congestion, mouth sores and sinus pressure.   Eyes: Negative for visual disturbance.  Respiratory: Negative for cough and chest tightness.   Gastrointestinal: Negative for abdominal pain and nausea.  Genitourinary: Negative for difficulty urinating, frequency and vaginal pain.  Musculoskeletal: Negative for back pain and gait problem.    Skin: Negative for pallor and rash.  Neurological: Negative for dizziness, tremors, weakness, numbness and headaches.  Psychiatric/Behavioral: Negative for confusion and sleep disturbance. The patient is nervous/anxious.     Objective:  There were no vitals taken for this visit.  BP Readings from Last 3 Encounters:  07/01/16 112/84  06/24/16 124/72  05/20/16 136/86    Wt Readings from Last 3 Encounters:  07/01/16 132 lb 12 oz (60.2 kg)  06/24/16 134 lb 6 oz (61 kg)  05/20/16 137 lb (62.1 kg)    Physical Exam  Constitutional: She appears well-developed. No distress.  HENT:  Head: Normocephalic.  Right Ear: External ear normal.  Left Ear: External ear normal.  Nose: Nose normal.  Mouth/Throat: Oropharynx is clear and moist.  Eyes: Conjunctivae are normal. Pupils are equal, round, and reactive to light. Right eye exhibits no discharge. Left eye exhibits no discharge.  Neck: Normal range of motion. Neck supple. No JVD present. No tracheal deviation present. No thyromegaly present.  Cardiovascular: Normal rate, regular rhythm and normal heart sounds.   Pulmonary/Chest: No stridor. No respiratory distress. She has no wheezes.  Abdominal: Soft. Bowel sounds are normal. She exhibits no distension and no mass. There is no tenderness. There is no rebound and no guarding.  Musculoskeletal: She exhibits no edema or tenderness.  Lymphadenopathy:    She has no cervical adenopathy.  Neurological: She displays normal reflexes. No cranial nerve  deficit. She exhibits normal muscle tone. Coordination normal.  Skin: No rash noted. No erythema.  Psychiatric: She has a normal mood and affect. Her behavior is normal. Judgment and thought content normal.    Lab Results  Component Value Date   WBC 7.0 02/12/2016   HGB 14.0 02/12/2016   HCT 39.8 02/12/2016   PLT 234.0 02/12/2016   GLUCOSE 92 02/12/2016   CHOL 152 02/12/2016   TRIG 172.0 (H) 02/12/2016   HDL 37.40 (L) 02/12/2016   LDLCALC  80 02/12/2016   ALT 4 02/12/2016   AST 15 02/12/2016   NA 140 02/12/2016   K 4.3 02/12/2016   CL 105 02/12/2016   CREATININE 0.75 02/12/2016   BUN 15 02/12/2016   CO2 30 02/12/2016   TSH 1.63 02/12/2016   INR 1.0 09/12/2015   HGBA1C 4.4 (L) 07/30/2006    Ct Head Wo Contrast  Result Date: 07/02/2016 CLINICAL DATA:  Fall EXAM: CT HEAD WITHOUT CONTRAST TECHNIQUE: Contiguous axial images were obtained from the base of the skull through the vertex without intravenous contrast. COMPARISON:  None. FINDINGS: Brain: No mass lesion, intraparenchymal hemorrhage or extra-axial collection. No evidence of acute cortical infarct. Brain parenchyma and CSF-containing spaces are normal for age. Vascular: No hyperdense vessel or unexpected calcification. Skull: Normal visualized skull base, calvarium and extracranial soft tissues. Sinuses/Orbits: No sinus fluid levels or advanced mucosal thickening. No mastoid effusion. Normal orbits. IMPRESSION: Normal head CT. Electronically Signed   By: Ulyses Jarred M.D.   On: 07/02/2016 16:45    Assessment & Plan:   There are no diagnoses linked to this encounter. I am having Ms. Keisler maintain her PHOSPHATIDYLSERINE PO, VOLTAREN, Biotin, CORAL CALCIUM PO, Omega-3 Fatty Acids (ULTRA OMEGA 3 PO), vitamin C, Vitamin D3, Coenzyme Q10, cyclobenzaprine, ibuprofen, traMADol, and DULoxetine.  No orders of the defined types were placed in this encounter.    Follow-up: No Follow-up on file.  Walker Kehr, MD

## 2016-07-23 NOTE — Assessment & Plan Note (Addendum)
Post-concussion syndrome discussed Neurol ref

## 2016-07-23 NOTE — Progress Notes (Signed)
Pre-visit discussion using our clinic review tool. No additional management support is needed unless otherwise documented below in the visit note.  

## 2016-07-24 ENCOUNTER — Encounter: Payer: Self-pay | Admitting: Internal Medicine

## 2016-07-24 ENCOUNTER — Encounter: Payer: Self-pay | Admitting: Family Medicine

## 2016-07-24 DIAGNOSIS — F0781 Postconcussional syndrome: Secondary | ICD-10-CM | POA: Insufficient documentation

## 2016-07-24 NOTE — Assessment & Plan Note (Signed)
Likely has more of a postconcussive syndrome. We discussed with patient at great length. We discussed icing regimen, over-the-counter medications can help with some symptom technology. We discussed which activities potentially avoid. She will follow-up again in 2 weeks for repeat testing with impact.

## 2016-07-24 NOTE — Patient Instructions (Signed)
Good to see you I do think you have a concussion.  I would like you to limit screen time (including your phone) to 30 minutes daily outside of homework.  No sport until you are back in school with no symptoms.  You may then start the return to play protocol I am giving you.  In addition to this I recommend......  To help improve COGNITIVE function: Using fish oil/omega 3 that is 1000 mg (or roughly 600 mg EPA/DHA), starting as soon as possible after concussion, take: 3 tabs THREE TIMES a day  for the first 3 days, then (you will smell a little, sory) 3 tabs TWICE DAILY  for the next 3 days, then 3 tabs ONCE DAILY  for the next 10 days   To help reduce HEADACHES: Coenzyme Q10 160mg  ONCE DAILY Riboflavin/Vitamin B2 400mg  ONCE DAILY Magnesium oxide 400mg  ONCE - TWICE DAILY May stop after headaches are resolved.                                                                                             To help with INSOMNIA: Melatonin 3-5mg  AT BEDTIME     Other medicines to help decrease inflammation Alpha Lipoic Acid 100mg  TWICE DAILY Turmeric 500mg  twice daily  I want to see you again in 2 weeks

## 2016-07-27 ENCOUNTER — Encounter: Payer: Self-pay | Admitting: Family Medicine

## 2016-07-27 ENCOUNTER — Encounter: Payer: Self-pay | Admitting: Internal Medicine

## 2016-07-29 ENCOUNTER — Other Ambulatory Visit: Payer: Self-pay | Admitting: Internal Medicine

## 2016-07-29 ENCOUNTER — Other Ambulatory Visit: Payer: Self-pay

## 2016-07-29 MED ORDER — METHYLPHENIDATE HCL 5 MG PO TABS: 2.5000 mg | ORAL_TABLET | Freq: Every morning | ORAL | 0 refills | Status: DC

## 2016-07-31 ENCOUNTER — Encounter: Payer: Self-pay | Admitting: Neurology

## 2016-08-06 NOTE — Progress Notes (Signed)
[name] [address] [place], [place] [address] Phone: [phone] Subjective:    I'm seeing this patient by the request  of:    CC: head injury possible concussion f/u   [id number]  Rebecca Marks is a 61 y.o. female coming in with complaint of Head injury. Patient states that this started a proximal weight 4 weeks ago. Patient did fall. Possible syncopal episode. Patient states that she may have passed out in the bathroom. Possible loss of consciousness of 3-5 minutes. Patient was found by her husband who did state he later to wake up. Patient states unfortunately she continues to have headache, increasing fatigue, sometimes visual changes, significant fatigue. Past medical history significant for possible concussion previously.  Update 08/06/2016. Patient was seen previously and was diagnosed with a concussion. This was verified with patient's impact score. Patient was to do conservative therapy and given significant information to over-the-counter medications a could be beneficial. Patient states She has not had headaches or 4 days. Patient states that her Medicare provider did give her ritalin that she is not taking it regularly. States still difficulty finding words.      Past Medical History:  Diagnosis Date  . Allergy   . Anxiety   . Arthritis    R wrist and R knee  . CAP (community acquired pneumonia) 4/17-22/15   Rochephin & IV Zithromax & 3 days Levaquin  . Chronic back pain   . DVT (deep venous thrombosis) ([place])    a. in her 20s while on OCPs.  . Hepatitis C    contaminated blood @ her job, prior notes indicate spontaneously cleared  . History of tobacco abuse   . Osteoarthritis   . Palpitations   . Premature atrial contractions    a. Holter 11/2013: few PACs.  . Pulmonary function study abnormality    a. 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  . Raynaud phenomenon   . Ruptured lumbar disc    4 herniated disc    . Volume overload    a.  history of volume overload during hospital stay for PNA in 08/2013 felt iatrogenic due to receiving a lot of IV fluid. 2D echo 08/2013 showed EF 86-57%, normal diastolic function parameters.   Past Surgical History:  Procedure Laterality Date  . CHOLECYSTECTOMY  1985   Social History   Social History  . Marital status: Married    Spouse name: N/A  . Number of children: N/A  . Years of education: N/A   Social History Main Topics  . Smoking status: Former Smoker    Packs/day: 0.25    Years: 25.00    Types: Cigarettes    Quit date: 08/19/2013  . Smokeless tobacco: Never Used     Comment: smoked 1979-2008, up to 1 ppd. As of 08/01/13 1 cig every 2 weeks  . Alcohol use No  . Drug use: No     Comment: History-long time ago  . Sexual activity: Yes    Birth control/ protection: None   Other Topics Concern  . None   Social History Narrative  . None   No Known Allergies Family History  Problem Relation Age of Onset  . Diabetes Father   . Hypertension Father   . Heart attack Father     >55  . Lymphoma Father   . Stroke Mother 20    after bedridden post fall with BLE paralysis  . Breast cancer Mother      X  3  . Breast cancer Sister   . Alzheimer's disease Maternal Grandmother   . Stroke Maternal Grandfather     in 38s  . Colon cancer Neg Hx     Past medical history, social, surgical and family history all reviewed in electronic medical record.  No pertanent information unless stated regarding to the chief complaint.   Review of Systems:Review of systems updated and as accurate as of 08/07/16  No headache, visual changes, nausea, vomiting, diarrhea, constipation, dizziness, abdominal pain, skin rash, fevers, chills, night sweats, weight loss, swollen lymph nodes, body aches, joint swelling, muscle aches, chest pain, shortness of breath, mood changes.   Objective  Blood pressure 118/78, pulse 80, height 5\' 6"  (1.676 m), weight 132 lb (59.9 kg).     Systems examined below as of 08/07/16 General: NAD A&O x3 mood, affect normal  HEENT: Pupils equal, extraocular movements intact no nystagmus Respiratory: not short of breath at rest or with speaking Cardiovascular: No lower extremity edema, non tender Skin: Warm dry intact with no signs of infection or rash on extremities or on axial skeleton. Abdomen: Soft nontender, no masses Neuro: Cranial nerves  intact, neurovascularly intact in all extremities with 2+ DTRs and 2+ pulses. Lymph: No lymphadenopathy appreciated today  Gait normal with good balance and coordination.  MSK: Non tender with full range of motion and good stability and symmetric strength and tone of shoulders, elbows, wrist,  knee hips and ankles bilaterally.  Mild arthritic changes of multiple joints Neck: Inspection unremarkable. No palpable stepoffs. Negative Spurling's maneuver. Full neck range of motion Grip strength and sensation normal in bilateral hands Strength good C4 to T1 distribution No sensory change to C4 to T1 Negative Hoffman sign bilaterally Reflexes normal Impact testing shows the patient does have what appears to be a postconcussive symptoms with composite scores moderately improved from previous exam. Patient's coefficient went from 0.0-2.2. This likely means that there is other exacerbating factor such as patient's lack of sleep. Patient's memory and cognitive abilities still lower than what would be anticipated..   Neck exam : Neck: Inspection unremarkable. No palpable stepoffs. Negative Spurling's maneuver. Full neck range of motion Grip strength and sensation normal in bilateral hands Strength good C4 to T1 distribution No sensory change to C4 to T1 Negative Hoffman sign bilaterally Reflexes normal          Impression and Recommendations:     This case required medical decision making of moderate complexity.      Note: This dictation was prepared with Dragon dictation along  with smaller phrase technology. Any transcriptional errors that result from this process are unintentional.

## 2016-08-07 ENCOUNTER — Encounter: Payer: Self-pay | Admitting: Family Medicine

## 2016-08-07 ENCOUNTER — Ambulatory Visit (INDEPENDENT_AMBULATORY_CARE_PROVIDER_SITE_OTHER): Payer: BLUE CROSS/BLUE SHIELD | Admitting: Family Medicine

## 2016-08-07 DIAGNOSIS — F0781 Postconcussional syndrome: Secondary | ICD-10-CM | POA: Diagnosis not present

## 2016-08-07 MED ORDER — HYDROXYZINE HCL 25 MG PO TABS: 25.0000 mg | ORAL_TABLET | Freq: Three times a day (TID) | ORAL | 1 refills | Status: DC | PRN

## 2016-08-07 MED ORDER — TRAMADOL HCL 50 MG PO TABS: 50.0000 mg | ORAL_TABLET | Freq: Three times a day (TID) | ORAL | 0 refills | Status: DC | PRN

## 2016-08-07 NOTE — Patient Instructions (Addendum)
Better but not great  I think sleep is playing a role.  Stop the headache medicines at this time.  Tart cherry extract at night any dose to help with sleep.  Fish oil now only 3 grams dail  hydroxyzine may be better then the tramadol and help you with sleep.  You likely will hit a wall in 2 days to warn you  You will be ready for [place]  Tell Nicole Kindred he needs statin no matter what at least 1/2 the dose.  I want to see you again in 3 weeks and do one more test  Send me weekly updates though in my chart to tell me how you are doing.

## 2016-08-07 NOTE — Assessment & Plan Note (Signed)
Skin concussive syndrome noted. We discussed with patient and she is made some mild improvement. I believe some of the medication she has been taking could be contributing and patient has already dictation made the changes. With patient not having any headaches in 4 days and patient's vestibular neuro exam being unremarkable I think the patient will improve. Patient is leaving the country in 3 weeks and wants to make sure that she'll be doing well. I would like her to come back again in another 2-3 weeks to be further evaluated with one more impact test to see if patient is getting back to her baseline. Hopefully once patient is sleeping more appropriately she should be well. Please see other medication changes per patient instructions.  Spent  45 minutes with patient face-to-face and had greater than 50% of counseling including as described above in assessment and plan.

## 2016-08-11 ENCOUNTER — Other Ambulatory Visit: Payer: Self-pay | Admitting: *Deleted

## 2016-08-11 MED ORDER — DULOXETINE HCL 20 MG PO CPEP: 20.0000 mg | ORAL_CAPSULE | Freq: Every day | ORAL | 1 refills | Status: DC

## 2016-08-14 ENCOUNTER — Encounter: Payer: Self-pay | Admitting: Family Medicine

## 2016-08-14 ENCOUNTER — Encounter: Payer: Self-pay | Admitting: Internal Medicine

## 2016-08-26 ENCOUNTER — Encounter: Payer: Self-pay | Admitting: Internal Medicine

## 2016-08-26 ENCOUNTER — Ambulatory Visit (INDEPENDENT_AMBULATORY_CARE_PROVIDER_SITE_OTHER): Payer: BLUE CROSS/BLUE SHIELD | Admitting: Internal Medicine

## 2016-08-26 DIAGNOSIS — F0781 Postconcussional syndrome: Secondary | ICD-10-CM

## 2016-08-26 DIAGNOSIS — S060X1A Concussion with loss of consciousness of 30 minutes or less, initial encounter: Secondary | ICD-10-CM | POA: Diagnosis not present

## 2016-08-26 DIAGNOSIS — Z7189 Other specified counseling: Secondary | ICD-10-CM

## 2016-08-26 DIAGNOSIS — F5104 Psychophysiologic insomnia: Secondary | ICD-10-CM | POA: Diagnosis not present

## 2016-08-26 DIAGNOSIS — R55 Syncope and collapse: Secondary | ICD-10-CM | POA: Diagnosis not present

## 2016-08-26 DIAGNOSIS — F5101 Primary insomnia: Secondary | ICD-10-CM

## 2016-08-26 DIAGNOSIS — M2550 Pain in unspecified joint: Secondary | ICD-10-CM | POA: Diagnosis not present

## 2016-08-26 DIAGNOSIS — M79604 Pain in right leg: Secondary | ICD-10-CM | POA: Diagnosis not present

## 2016-08-26 DIAGNOSIS — Z7184 Encounter for health counseling related to travel: Secondary | ICD-10-CM

## 2016-08-26 DIAGNOSIS — G4700 Insomnia, unspecified: Secondary | ICD-10-CM | POA: Insufficient documentation

## 2016-08-26 DIAGNOSIS — M79605 Pain in left leg: Secondary | ICD-10-CM | POA: Diagnosis not present

## 2016-08-26 MED ORDER — OSELTAMIVIR PHOSPHATE 75 MG PO CAPS: 75.0000 mg | ORAL_CAPSULE | Freq: Two times a day (BID) | ORAL | 0 refills | Status: DC

## 2016-08-26 MED ORDER — TRAMADOL HCL 50 MG PO TABS: 25.0000 mg | ORAL_TABLET | Freq: Two times a day (BID) | ORAL | 1 refills | Status: DC | PRN

## 2016-08-26 MED ORDER — DOXEPIN HCL 10 MG PO CAPS: 10.0000 mg | ORAL_CAPSULE | Freq: Every evening | ORAL | 3 refills | Status: DC | PRN

## 2016-08-26 MED ORDER — ZOLPIDEM TARTRATE 5 MG PO TABS: 5.0000 mg | ORAL_TABLET | Freq: Every evening | ORAL | 0 refills | Status: DC | PRN

## 2016-08-26 MED ORDER — AZITHROMYCIN 250 MG PO TABS: ORAL_TABLET | ORAL | 0 refills | Status: DC

## 2016-08-26 NOTE — Progress Notes (Signed)
Subjective:  Patient ID: Rebecca Marks, female    DOB: Aug 14, 1955  Age: 61 y.o. MRN: [id number]  CC: No chief complaint on file.   HPI Avarae Zwart presents for post-concussion syndrome Mom w/a flu Traveling soon  Outpatient Medications Prior to Visit  Medication Sig Dispense Refill  . Ascorbic Acid (VITAMIN C) 1000 MG tablet Take 2,000 mg by mouth 2 (two) times daily.     . Biotin 5000 MCG CAPS Take 1 capsule by mouth daily.    . Cholecalciferol (VITAMIN D3) 2000 UNITS TABS Take 2 tablets by mouth daily.    . Coenzyme Q10 200 MG capsule Take 200 mg by mouth daily.    [name] CORAL CALCIUM PO Take by mouth. 1000mg -3000mg  daily.    . cyclobenzaprine (FLEXERIL) 10 MG tablet Take 7.5 mg by mouth at bedtime.    . DULoxetine (CYMBALTA) 20 MG capsule Take 1 capsule (20 mg total) by mouth daily. 90 capsule 1  . hydrOXYzine (ATARAX/VISTARIL) 25 MG tablet Take 1 tablet (25 mg total) by mouth 3 (three) times daily as needed. 30 tablet 1  . ibuprofen (ADVIL,MOTRIN) 200 MG tablet Take 800 mg by mouth daily.     . Omega-3 Fatty Acids (ULTRA OMEGA 3 PO) Take by mouth. 1-2 grams daily.    [name] PHOSPHATIDYLSERINE PO Take 300 mg by mouth daily.     . Phosphatidylserine-DHA-EPA (VAYACOG) 100-19.5-6.5 MG CAPS Take 1 capsule by mouth daily. 30 capsule 11  . VOLTAREN 1 % GEL Apply 1 application topically at bedtime. Applied to knee, hand, fingers.    . traMADol (ULTRAM) 50 MG tablet Take 25 mg by mouth at bedtime as needed.    . traMADol (ULTRAM) 50 MG tablet Take 1 tablet (50 mg total) by mouth every 8 (eight) hours as needed. 30 tablet 0   No facility-administered medications prior to visit.     ROS Review of Systems  Constitutional: Negative for activity change, appetite change, chills, fatigue and unexpected weight change.  HENT: Negative for congestion, mouth sores and sinus pressure.   Eyes: Negative for visual disturbance.  Respiratory: Negative for cough and chest tightness.     Gastrointestinal: Negative for abdominal pain and nausea.  Genitourinary: Negative for difficulty urinating, frequency and vaginal pain.  Musculoskeletal: Negative for back pain and gait problem.  Skin: Negative for pallor and rash.  Neurological: Negative for dizziness, tremors, weakness, numbness and headaches.  Psychiatric/Behavioral: Negative for confusion and sleep disturbance. The patient is nervous/anxious.     Objective:  BP 116/78 (BP Location: Left Arm, Patient Position: Sitting, Cuff Size: Normal)   Pulse 85   Temp 99 F (37.2 C) (Oral)   Ht 5\' 6"  (1.676 m)   Wt 135 lb 1.3 oz (61.3 kg)   SpO2 98%   BMI 21.80 kg/m   BP Readings from Last 3 Encounters:  08/26/16 116/78  08/07/16 118/78  07/23/16 100/74    Wt Readings from Last 3 Encounters:  08/26/16 135 lb 1.3 oz (61.3 kg)  08/07/16 132 lb (59.9 kg)  07/23/16 133 lb (60.3 kg)    Physical Exam  Constitutional: She appears well-developed. No distress.  HENT:  Head: Normocephalic.  Right Ear: External ear normal.  Left Ear: External ear normal.  Nose: Nose normal.  Mouth/Throat: Oropharynx is clear and moist.  Eyes: Conjunctivae are normal. Pupils are equal, round, and reactive to light. Right eye exhibits no discharge. Left eye exhibits no discharge.  Neck: Normal range of motion. Neck supple. No JVD present.  No tracheal deviation present. No thyromegaly present.  Cardiovascular: Normal rate, regular rhythm and normal heart sounds.   Pulmonary/Chest: No stridor. No respiratory distress. She has no wheezes.  Abdominal: Soft. Bowel sounds are normal. She exhibits no distension and no mass. There is no tenderness. There is no rebound and no guarding.  Musculoskeletal: She exhibits no edema or tenderness.  Lymphadenopathy:    She has no cervical adenopathy.  Neurological: She displays normal reflexes. No cranial nerve deficit. She exhibits normal muscle tone. Coordination normal.  Skin: No rash noted. No  erythema.  Psychiatric: She has a normal mood and affect. Her behavior is normal. Judgment and thought content normal.    Lab Results  Component Value Date   WBC 7.0 02/12/2016   HGB 14.0 02/12/2016   HCT 39.8 02/12/2016   PLT 234.0 02/12/2016   GLUCOSE 92 02/12/2016   CHOL 152 02/12/2016   TRIG 172.0 (H) 02/12/2016   HDL 37.40 (L) 02/12/2016   LDLCALC 80 02/12/2016   ALT 4 02/12/2016   AST 15 02/12/2016   NA 140 02/12/2016   K 4.3 02/12/2016   CL 105 02/12/2016   CREATININE 0.75 02/12/2016   BUN 15 02/12/2016   CO2 30 02/12/2016   TSH 1.63 02/12/2016   INR 1.0 09/12/2015   HGBA1C 4.4 (L) 07/30/2006    Ct Head Wo Contrast  Result Date: 07/02/2016 CLINICAL DATA:  Fall EXAM: CT HEAD WITHOUT CONTRAST TECHNIQUE: Contiguous axial images were obtained from the base of the skull through the vertex without intravenous contrast. COMPARISON:  None. FINDINGS: Brain: No mass lesion, intraparenchymal hemorrhage or extra-axial collection. No evidence of acute cortical infarct. Brain parenchyma and CSF-containing spaces are normal for age. Vascular: No hyperdense vessel or unexpected calcification. Skull: Normal visualized skull base, calvarium and extracranial soft tissues. Sinuses/Orbits: No sinus fluid levels or advanced mucosal thickening. No mastoid effusion. Normal orbits. IMPRESSION: Normal head CT. Electronically Signed   By: Ulyses Jarred M.D.   On: 07/02/2016 16:45    Assessment & Plan:   There are no diagnoses linked to this encounter. I am having Ms. Hinderer maintain her PHOSPHATIDYLSERINE PO, VOLTAREN, Biotin, CORAL CALCIUM PO, Omega-3 Fatty Acids (ULTRA OMEGA 3 PO), vitamin C, Vitamin D3, Coenzyme Q10, cyclobenzaprine, ibuprofen, traMADol, Phosphatidylserine-DHA-EPA, hydrOXYzine, DULoxetine, ALPRAZolam, and HYDROcodone-acetaminophen.  Meds ordered this encounter  Medications  . ALPRAZolam (XANAX) 0.5 MG tablet    Sig: Take 0.5 mg by mouth 2 (two) times daily as needed.     Refill:  5  . HYDROcodone-acetaminophen (NORCO/VICODIN) 5-325 MG tablet    Sig: TAKE 1 TABLET BY MOUTH EVERY 4 TO 6 HOURS AS NEEDED FOR PAIN (MAX 4 TABS IN 24 HOURS)    Refill:  0     Follow-up: No Follow-up on file.  Walker Kehr, MD

## 2016-08-26 NOTE — Assessment & Plan Note (Signed)
Zolpidem for a [place] trip Doxepin when returned

## 2016-08-26 NOTE — Progress Notes (Signed)
Pre visit review using our clinic review tool, if applicable. No additional management support is needed unless otherwise documented below in the visit note. 

## 2016-08-29 ENCOUNTER — Ambulatory Visit (INDEPENDENT_AMBULATORY_CARE_PROVIDER_SITE_OTHER): Payer: BLUE CROSS/BLUE SHIELD | Admitting: Family Medicine

## 2016-08-29 ENCOUNTER — Encounter: Payer: Self-pay | Admitting: Family Medicine

## 2016-08-29 DIAGNOSIS — S060X1A Concussion with loss of consciousness of 30 minutes or less, initial encounter: Secondary | ICD-10-CM

## 2016-08-29 DIAGNOSIS — F5101 Primary insomnia: Secondary | ICD-10-CM | POA: Diagnosis not present

## 2016-08-29 MED ORDER — GABAPENTIN 100 MG PO CAPS: 200.0000 mg | ORAL_CAPSULE | Freq: Every day | ORAL | 3 refills | Status: DC

## 2016-08-29 NOTE — Progress Notes (Signed)
[name] [place], [place] [address] Phone: [phone] Subjective:     CC: head injury possible concussion f/u   [id number]  Rebecca Marks is a 61 y.o. female coming in with complaint of Head injury. Patient states that this started a proximal weight 4 weeks ago. Patient did fall. Possible syncopal episode. Patient states that she may have passed out in the bathroom. Possible loss of consciousness of 3-5 minutes. Patient was found by her husband who did state he later to wake up. Patient states unfortunately she continues to have headache, increasing fatigue, sometimes visual changes, significant fatigue. Past medical history significant for possible concussion previously.  Update 08/06/2016. Patient was seen previously and was diagnosed with a concussion. This was verified with patient's impact score. Patient was to do conservative therapy and given significant information to over-the-counter medications a could be beneficial. Patient states She has not had headaches or 4 days. Patient states that her Medicare provider did give her ritalin that she is not taking it regularly. States still difficulty finding words.    update in full 13 2018-. Patient states that overall she has made improvement. The only symptom she continues to have his insomnia. Patient has been having then for quite some time. Patient states that they have been before the headaches as well as a concussion. Patient is having significant increased stress in her life as well.  Past Medical History:  Diagnosis Date  . Allergy   . Anxiety   . Arthritis    R wrist and R knee  . CAP (community acquired pneumonia) 4/17-22/15   Rochephin & IV Zithromax & 3 days Levaquin  . Chronic back pain   . DVT (deep venous thrombosis) ([place])    a. in her 20s while on OCPs.  . Hepatitis C    contaminated blood @ her job, prior notes indicate spontaneously cleared  . History of tobacco  abuse   . Osteoarthritis   . Palpitations   . Premature atrial contractions    a. Holter 11/2013: few PACs.  . Pulmonary function study abnormality    a. 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  . Raynaud phenomenon   . Ruptured lumbar disc    4 herniated disc   . Volume overload    a.  history of volume overload during hospital stay for PNA in 08/2013 felt iatrogenic due to receiving a lot of IV fluid. 2D echo 08/2013 showed EF 90-30%, normal diastolic function parameters.   Past Surgical History:  Procedure Laterality Date  . CHOLECYSTECTOMY  1985   Social History   Social History  . Marital status: Married    Spouse name: N/A  . Number of children: N/A  . Years of education: N/A   Social History Main Topics  . Smoking status: Former Smoker    Packs/day: 0.25    Years: 25.00    Types: Cigarettes    Quit date: 08/19/2013  . Smokeless tobacco: Never Used     Comment: smoked 1979-2008, up to 1 ppd. As of 08/01/13 1 cig every 2 weeks  . Alcohol use No  . Drug use: No     Comment: History-long time ago  . Sexual activity: Yes    Birth control/ protection: None   Other Topics Concern  . None   Social History Narrative  . None   Allergies  Allergen Reactions  . Ritalin [Methylphenidate Hcl]     Too hyper   Family  History  Problem Relation Age of Onset  . Diabetes Father   . Hypertension Father   . Heart attack Father     >55  . Lymphoma Father   . Stroke Mother 31    after bedridden post fall with BLE paralysis  . Breast cancer Mother      X 3  . Breast cancer Sister   . Alzheimer's disease Maternal Grandmother   . Stroke Maternal Grandfather     in 52s  . Colon cancer Neg Hx     Past medical history, social, surgical and family history all reviewed in electronic medical record.  No pertanent information unless stated regarding to the chief complaint.   Review of Systems: No headache, visual changes, nausea, vomiting, diarrhea, constipation,  dizziness, abdominal pain, skin rash, fevers, chills, night sweats, weight loss, swollen lymph nodes, body aches, joint swelling, muscle aches, chest pain, shortness of breath, mood changes.    Objective  Blood pressure 118/82, pulse 84, weight 136 lb (61.7 kg).   Systems examined below as of 08/29/16 General: NAD A&O x3 mood, affect normal  HEENT: Pupils equal, extraocular movements intact no nystagmus Respiratory: not short of breath at rest or with speaking Cardiovascular: No lower extremity edema, non tender Skin: Warm dry intact with no signs of infection or rash on extremities or on axial skeleton. Abdomen: Soft nontender, no masses Neuro: Cranial nerves  intact, neurovascularly intact in all extremities with 2+ DTRs and 2+ pulses. Lymph: No lymphadenopathy appreciated today  Gait normal with good balance and coordination.  MSK: Non tender with full range of motion and good stability and symmetric strength and tone of shoulders, elbows, wrist,  knee hips and ankles bilaterally.   Neck: Inspection unremarkable. No palpable stepoffs. Negative Spurling's maneuver. Full neck range of motion Grip strength and sensation normal in bilateral hands Strength good C4 to T1 distribution No sensory change to C4 to T1 Negative Hoffman sign bilaterally Reflexes normal         Impression and Recommendations:     This case required medical decision making of moderate complexity.      Note: This dictation was prepared with Dragon dictation along with smaller phrase technology. Any transcriptional errors that result from this process are unintentional.

## 2016-08-29 NOTE — Assessment & Plan Note (Signed)
Patient does have a chronic problem. Started on gabapentin with her also having a history of low back pain as well as what he seems to be more of a restless leg syndrome. I do believe the patient has resulted postconcussive syndromes and this is not a sequelae. No other medication changes at this time. Patient will follow-up with me as needed.

## 2016-08-29 NOTE — Assessment & Plan Note (Signed)
Resolved at this time.  °

## 2016-08-29 NOTE — Patient Instructions (Signed)
Good to see you  Gabapentin 200mg  at night I will talk to Dr. Camila Li and see if we can make some difference Overall I think the stress is likely causing it  See me again in 4-6 weeks if not gone.  I HOPE you have a GREAT trip

## 2016-08-30 ENCOUNTER — Encounter: Payer: Self-pay | Admitting: Family Medicine

## 2016-08-31 DIAGNOSIS — Z7184 Encounter for health counseling related to travel: Secondary | ICD-10-CM | POA: Insufficient documentation

## 2016-08-31 NOTE — Assessment & Plan Note (Signed)
Slow improvement F/u w/[facility]

## 2016-08-31 NOTE — Assessment & Plan Note (Signed)
Zpac Zolpidem - low dose w/caution

## 2016-08-31 NOTE — Assessment & Plan Note (Signed)
No relapse 

## 2016-09-23 ENCOUNTER — Encounter: Payer: Self-pay | Admitting: Family Medicine

## 2016-09-23 ENCOUNTER — Ambulatory Visit (INDEPENDENT_AMBULATORY_CARE_PROVIDER_SITE_OTHER): Payer: BLUE CROSS/BLUE SHIELD | Admitting: Family Medicine

## 2016-09-23 DIAGNOSIS — F0781 Postconcussional syndrome: Secondary | ICD-10-CM

## 2016-09-23 NOTE — Patient Instructions (Signed)
Good to see you I am glad you had a good trip overall.  Continue the gabapentin 200mg  at night when needed to help the nerves DHEA 50 mg daily for 4 weeks then stop for 2 weeks can repeat as many times as needed If still not perfect see me again in 6 weeks.

## 2016-09-23 NOTE — Progress Notes (Signed)
[name] [place], [place] [address] Phone: [phone] Subjective:     CC: head injury possible concussion f/u   [id number]  Rebecca Marks is a 61 y.o. female coming in with complaint of Head injury. Patient states that this started a proximal weight 4 weeks ago. Patient did fall. Possible syncopal episode. Patient states that she may have passed out in the bathroom. Possible loss of consciousness of 3-5 minutes. Patient was found by her husband who did state he later to wake up. Patient states unfortunately she continues to have headache, increasing fatigue, sometimes visual changes, significant fatigue. Past medical history significant for possible concussion previously.  Update 08/06/2016. Patient was seen previously and was diagnosed with a concussion. This was verified with patient's impact score. Patient was to do conservative therapy and given significant information to over-the-counter medications a could be beneficial. Patient states She has not had headaches or 4 days. Patient states that her Medicare provider did give her ritalin that she is not taking it regularly. States still difficulty finding words.    update in full 13 2018-. Patient states that overall she has made improvement. The only symptom she continues to have his insomnia. Patient has been having then for quite some time. Patient states that they have been before the headaches as well as a concussion. Patient is having significant increased stress in her life as well.  Update Sep 22 2016- Patient went to her trip to [place]. Did relatively well but had difficulty with complex situations where she had to follow multiple directions. Other than that patient feels like she is doing relatively better. He is sleeping better. Very happy with the results of far. Still feels that her reasoning is a little bit skewed. No more headaches  Past Medical History:  Diagnosis Date  .  Allergy   . Anxiety   . Arthritis    R wrist and R knee  . CAP (community acquired pneumonia) 4/17-22/15   Rochephin & IV Zithromax & 3 days Levaquin  . Chronic back pain   . DVT (deep venous thrombosis) ([place])    a. in her 20s while on OCPs.  . Hepatitis C    contaminated blood @ her job, prior notes indicate spontaneously cleared  . History of tobacco abuse   . Osteoarthritis   . Palpitations   . Premature atrial contractions    a. Holter 11/2013: few PACs.  . Pulmonary function study abnormality    a. 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  . Raynaud phenomenon   . Ruptured lumbar disc    4 herniated disc   . Volume overload    a.  history of volume overload during hospital stay for PNA in 08/2013 felt iatrogenic due to receiving a lot of IV fluid. 2D echo 08/2013 showed EF 09-38%, normal diastolic function parameters.   Past Surgical History:  Procedure Laterality Date  . CHOLECYSTECTOMY  1985   Social History   Social History  . Marital status: Married    Spouse name: N/A  . Number of children: N/A  . Years of education: N/A   Social History Main Topics  . Smoking status: Former Smoker    Packs/day: 0.25    Years: 25.00    Types: Cigarettes    Quit date: 08/19/2013  . Smokeless tobacco: Never Used     Comment: smoked 1979-2008, up to 1 ppd. As of 08/01/13 1 cig every 2 weeks  . Alcohol  use No  . Drug use: No     Comment: History-long time ago  . Sexual activity: Yes    Birth control/ protection: None   Other Topics Concern  . Not on file   Social History Narrative  . No narrative on file   Allergies  Allergen Reactions  . Ritalin [Methylphenidate Hcl]     Too hyper   Family History  Problem Relation Age of Onset  . Diabetes Father   . Hypertension Father   . Heart attack Father     >55  . Lymphoma Father   . Stroke Mother 74    after bedridden post fall with BLE paralysis  . Breast cancer Mother      X 3  . Breast cancer Sister   .  Alzheimer's disease Maternal Grandmother   . Stroke Maternal Grandfather     in 71s  . Colon cancer Neg Hx     Past medical history, social, surgical and family history all reviewed in electronic medical record.  No pertanent information unless stated regarding to the chief complaint.   Review of Systems: No headache, visual changes, nausea, vomiting, diarrhea, constipation, dizziness, abdominal pain, skin rash, fevers, chills, night sweats, weight loss, swollen lymph nodes, body aches, joint swelling, muscle aches, chest pain, shortness of breath, mood changes.     Objective  There were no vitals taken for this visit.   Systems examined below as of 09/23/16 General: NAD A&O x3 mood, affect normal  HEENT: Pupils equal, extraocular movements intact no nystagmus Respiratory: not short of breath at rest or with speaking Cardiovascular: No lower extremity edema, non tender Skin: Warm dry intact with no signs of infection or rash on extremities or on axial skeleton. Abdomen: Soft nontender, no masses Neuro: Cranial nerves  intact, neurovascularly intact in all extremities with 2+ DTRs and 2+ pulses. Lymph: No lymphadenopathy appreciated today  Gait normal with good balance and coordination.  MSK: Non tender with full range of motion and good stability and symmetric strength and tone of shoulders, elbows, wrist,  knee hips and ankles bilaterally.         Impression and Recommendations:     This case required medical decision making of moderate complexity.      Note: This dictation was prepared with Dragon dictation along with smaller phrase technology. Any transcriptional errors that result from this process are unintentional.

## 2016-09-23 NOTE — Assessment & Plan Note (Signed)
I believe the patient seems to be doing relatively wound this time. I do not think any changes in medication as necessary at this point. We will continue with the same medications. We discussed icing regimen if any other types of pains. We discussed potentially increasing Cymbalta in the long run which patient declined. Patient will continue to be active. Follow-up again in 4-6 weeks if not completely resolved.

## 2016-09-25 DIAGNOSIS — F4323 Adjustment disorder with mixed anxiety and depressed mood: Secondary | ICD-10-CM | POA: Diagnosis not present

## 2016-09-26 ENCOUNTER — Other Ambulatory Visit: Payer: Self-pay

## 2016-09-26 ENCOUNTER — Ambulatory Visit (INDEPENDENT_AMBULATORY_CARE_PROVIDER_SITE_OTHER): Payer: BLUE CROSS/BLUE SHIELD | Admitting: Physician Assistant

## 2016-09-26 ENCOUNTER — Encounter: Payer: Self-pay | Admitting: Physician Assistant

## 2016-09-26 VITALS — Wt 134.0 lb

## 2016-09-26 DIAGNOSIS — Z86010 Personal history of colonic polyps: Secondary | ICD-10-CM

## 2016-09-26 DIAGNOSIS — K5901 Slow transit constipation: Secondary | ICD-10-CM | POA: Diagnosis not present

## 2016-09-26 MED ORDER — DOXEPIN HCL 10 MG PO CAPS: 10.0000 mg | ORAL_CAPSULE | Freq: Every evening | ORAL | 0 refills | Status: DC | PRN

## 2016-09-26 NOTE — Progress Notes (Signed)
Subjective:    Patient ID: Rebecca Marks, female    DOB: 1955-06-14, 61 y.o.   MRN: [id number]  HPI Rebecca Marks is a pleasant 61 year old white female known to Dr. Havery Moros. She has history of hepatitis C which was treated and cleared, history of Raynauds, and colon polyps. Patient was last seen here in September 2016 when she had colonoscopy. She was found to have 2 flat polyps in the cecum and mild sigmoid diverticulosis. Path showed a tubular adenoma and a sessile serrated adenoma. She is indicated for 5 year interval follow-up. Patient comes in today for follow-up after a recent ER visit while she was out of the country on vacation in [place]. She says she has had long-term mild problems with constipation and had used Dulcolax suppositories on a regular basis. She says she became acutely constipated while traveling and didn't have a bowel movement for several days. This was associated with an episode of vomiting, abdominal discomfort bloating distention and some watery stool. She was evaluated in an ER there and had very good relief of her symptoms with an enema. She brought a copy of that report with her. She did not have any imaging done. She says she's been doing very well since. Since coming home she has started eating dried apricots and dry dates on a daily basis and says her bowels are moving great in fact better than they had in a long time. She has no complaints today of abdominal discomfort, no pain has not noted any melena or hematochezia. She generally drinks a lot of water. She did have trip insurance, and asks for a letter regarding her recent issues.  Review of Systems Pertinent positive and negative review of systems were noted in the above HPI section.  All other review of systems was otherwise negative.  Outpatient Encounter Prescriptions as of 09/26/2016  Medication Sig  . ALPRAZolam (XANAX) 0.5 MG tablet Take 0.5 mg by mouth 2 (two) times daily as needed.  . Ascorbic Acid  (VITAMIN C) 1000 MG tablet Take 2,000 mg by mouth 2 (two) times daily.   . Biotin 5000 MCG CAPS Take 1 capsule by mouth daily.  . Cholecalciferol (VITAMIN D3) 2000 UNITS TABS Take 2 tablets by mouth daily.  . Coenzyme Q10 200 MG capsule Take 200 mg by mouth daily.  [name] CORAL CALCIUM PO Take by mouth. 1000mg -3000mg  daily.  . cyclobenzaprine (FLEXERIL) 10 MG tablet Take 7.5 mg by mouth at bedtime.  . DULoxetine (CYMBALTA) 20 MG capsule Take 1 capsule (20 mg total) by mouth daily.  [name] gabapentin (NEURONTIN) 100 MG capsule Take 2 capsules (200 mg total) by mouth at bedtime.  . hydrOXYzine (ATARAX/VISTARIL) 25 MG tablet Take 1 tablet (25 mg total) by mouth 3 (three) times daily as needed.  [name] ibuprofen (ADVIL,MOTRIN) 200 MG tablet Take 800 mg by mouth daily.   . Omega-3 Fatty Acids (ULTRA OMEGA 3 PO) Take by mouth. 1-2 grams daily.  [name] PHOSPHATIDYLSERINE PO Take 300 mg by mouth daily.   . traMADol (ULTRAM) 50 MG tablet Take 0.5 tablets (25 mg total) by mouth 2 (two) times daily as needed for severe pain.  [name] VOLTAREN 1 % GEL Apply 1 application topically at bedtime. Applied to knee, hand, fingers.  . [DISCONTINUED] doxepin (SINEQUAN) 10 MG capsule Take 1 capsule (10 mg total) by mouth at bedtime as needed.  . [DISCONTINUED] Phosphatidylserine-DHA-EPA (VAYACOG) 100-19.5-6.5 MG CAPS Take 1 capsule by mouth daily.   No facility-administered encounter medications on file as of  09/26/2016.    Allergies  Allergen Reactions  . Ritalin [Methylphenidate Hcl]     Too hyper   Patient Active Problem List   Diagnosis Date Noted  . Travel advice encounter 08/31/2016  . Insomnia 08/26/2016  . Postconcussive syndrome 07/24/2016  . Somnolence 07/23/2016  . Syncope and collapse 07/01/2016  . Concussion 07/01/2016  . Febrile illness, acute 07/01/2016  . Respiratory infection 05/20/2016  . Stress at home 02/08/2016  . Vitamin D deficiency 09/11/2015  . Routine general medical examination at a health care  facility 09/11/2015  . Allergic rhinitis due to pollen 09/11/2015  . Premature atrial contractions   . Chronic low back pain 08/21/2014  . Nonallopathic lesion of lumbosacral region 08/21/2014  . Nonallopathic lesion of thoracic region 08/21/2014  . Nonallopathic lesion of sacral region 08/21/2014  . Osteoarthritis 08/02/2013  . Raynaud phenomenon 08/02/2013  . Hep C w/o coma, chronic ([place]) 05/24/2010   Social History   Social History  . Marital status: Married    Spouse name: N/A  . Number of children: N/A  . Years of education: N/A   Occupational History  . Not on file.   Social History Main Topics  . Smoking status: Former Smoker    Packs/day: 0.25    Years: 25.00    Types: Cigarettes    Quit date: 08/19/2013  . Smokeless tobacco: Never Used     Comment: smoked 1979-2008, up to 1 ppd. As of 08/01/13 1 cig every 2 weeks  . Alcohol use No  . Drug use: No     Comment: History-long time ago  . Sexual activity: Yes    Birth control/ protection: None   Other Topics Concern  . Not on file   Social History Narrative  . No narrative on file    Rebecca Marks's family history includes Alzheimer's disease in her maternal grandmother; Breast cancer in her mother and sister; Diabetes in her father; Heart attack in her father; Hypertension in her father; Lymphoma in her father; Stroke in her maternal grandfather; Stroke (age of onset: 27) in her mother.      Objective:    There were no vitals filed for this visit.  Physical Exam   well-developed white female in no acute distress, very pleasant, weight 134, BMI 21.63. HEENT; nontraumatic normocephalic EOMI PERRLA sclera anicteric, Cardiovascular; regular rate and rhythm with S1-S2 no murmur or gallop, Pulmonary; clear bilaterally Abdomen; soft, nontender, nondistended no palpable mass or hepatosplenomegaly, bowel sounds present, Rectal; exam not done, Ext;no clubbing cyanosis or edema skin warm and dry, Neuropsych ;mood and  affect appropriate       Assessment & Plan:   #67 61 year old white female with recent episode of acute obstipation while out of the country [place] in [place]. This necessitated an ER visit. Symptoms have resolved and she is currently doing very well, asymptomatic #2 mild chronic constipation #3 history of adenomatous colon polyps-colonoscopy September 2016-follow-up will be due September 2021  Plan; Patient will continue high-fiber diet including daily dates  apricots as she has been doing , and which is working well , liberal water intake. We discussed MiraLAX 17 g in 8 ounces of water on a daily basis as an alternative especially if traveling etc. She will follow up with Dr. Havery Moros or myself on an as-needed basis She is aware she will need follow-up colonoscopy in 2021.  Amy S Esterwood PA-C 09/26/2016   Cc: Plotnikov, Evie Lacks, MD

## 2016-09-26 NOTE — Progress Notes (Signed)
Agree with assessment and plan as outlined.  

## 2016-09-26 NOTE — Telephone Encounter (Signed)
Rec'd fax from [facility] for a 90 day suppy of Doxipen instead of the 30 with 3 refills

## 2016-09-26 NOTE — Patient Instructions (Signed)
Follow up with Amy Esterwood PA-C or Dr. Havery Moros as needed.  Your Colonoscopy will be due 01/2020.   We have provided you with a letter regarding travel.

## 2016-09-29 ENCOUNTER — Other Ambulatory Visit: Payer: Self-pay | Admitting: *Deleted

## 2016-09-29 MED ORDER — GABAPENTIN 100 MG PO CAPS: 200.0000 mg | ORAL_CAPSULE | Freq: Every day | ORAL | 3 refills | Status: DC

## 2016-10-02 ENCOUNTER — Ambulatory Visit: Payer: BLUE CROSS/BLUE SHIELD | Admitting: Neurology

## 2016-10-16 DIAGNOSIS — R87610 Atypical squamous cells of undetermined significance on cytologic smear of cervix (ASC-US): Secondary | ICD-10-CM | POA: Diagnosis not present

## 2016-11-06 DIAGNOSIS — R87610 Atypical squamous cells of undetermined significance on cytologic smear of cervix (ASC-US): Secondary | ICD-10-CM | POA: Diagnosis not present

## 2016-11-06 DIAGNOSIS — N858 Other specified noninflammatory disorders of uterus: Secondary | ICD-10-CM | POA: Diagnosis not present

## 2016-12-11 DIAGNOSIS — F4323 Adjustment disorder with mixed anxiety and depressed mood: Secondary | ICD-10-CM | POA: Diagnosis not present

## 2016-12-18 DIAGNOSIS — Z1231 Encounter for screening mammogram for malignant neoplasm of breast: Secondary | ICD-10-CM | POA: Diagnosis not present

## 2016-12-18 LAB — HM MAMMOGRAPHY

## 2016-12-22 ENCOUNTER — Other Ambulatory Visit: Payer: Self-pay | Admitting: Internal Medicine

## 2017-01-01 ENCOUNTER — Encounter: Payer: Self-pay | Admitting: Internal Medicine

## 2017-01-08 ENCOUNTER — Other Ambulatory Visit: Payer: Self-pay | Admitting: Internal Medicine

## 2017-01-27 DIAGNOSIS — Z803 Family history of malignant neoplasm of breast: Secondary | ICD-10-CM | POA: Diagnosis not present

## 2017-02-05 ENCOUNTER — Telehealth: Payer: Self-pay | Admitting: Internal Medicine

## 2017-02-05 ENCOUNTER — Other Ambulatory Visit (INDEPENDENT_AMBULATORY_CARE_PROVIDER_SITE_OTHER): Payer: BLUE CROSS/BLUE SHIELD

## 2017-02-05 ENCOUNTER — Other Ambulatory Visit: Payer: Self-pay | Admitting: Internal Medicine

## 2017-02-05 DIAGNOSIS — R350 Frequency of micturition: Secondary | ICD-10-CM

## 2017-02-05 LAB — URINALYSIS, ROUTINE W REFLEX MICROSCOPIC
BILIRUBIN URINE: NEGATIVE
KETONES UR: NEGATIVE
Nitrite: NEGATIVE
SPECIFIC GRAVITY, URINE: 1.01 (ref 1.000–1.030)
Total Protein, Urine: NEGATIVE
Urine Glucose: NEGATIVE
Urobilinogen, UA: 0.2 (ref 0.0–1.0)
pH: 6 (ref 5.0–8.0)

## 2017-02-05 MED ORDER — NITROFURANTOIN MONOHYD MACRO 100 MG PO CAPS: 100.0000 mg | ORAL_CAPSULE | Freq: Two times a day (BID) | ORAL | 0 refills | Status: DC

## 2017-02-05 NOTE — Telephone Encounter (Signed)
Lab entered, patient going down for UA

## 2017-02-05 NOTE — Telephone Encounter (Signed)
Ok UA Thx 

## 2017-02-05 NOTE — Telephone Encounter (Signed)
Thinks she has a UTI (urgency, burning, pain during urination). She is leaving to go out of town tomorrow. Can orders be put in for her today?

## 2017-02-05 NOTE — Telephone Encounter (Signed)
Please advise 

## 2017-02-06 DIAGNOSIS — F4323 Adjustment disorder with mixed anxiety and depressed mood: Secondary | ICD-10-CM | POA: Diagnosis not present

## 2017-02-09 MED ORDER — CIPROFLOXACIN HCL 250 MG PO TABS: 250.0000 mg | ORAL_TABLET | Freq: Two times a day (BID) | ORAL | 0 refills | Status: DC

## 2017-02-19 ENCOUNTER — Encounter: Payer: Self-pay | Admitting: Internal Medicine

## 2017-02-23 ENCOUNTER — Other Ambulatory Visit: Payer: Self-pay | Admitting: Internal Medicine

## 2017-03-12 DIAGNOSIS — F4323 Adjustment disorder with mixed anxiety and depressed mood: Secondary | ICD-10-CM | POA: Diagnosis not present

## 2017-04-13 DIAGNOSIS — Z6821 Body mass index (BMI) 21.0-21.9, adult: Secondary | ICD-10-CM | POA: Diagnosis not present

## 2017-04-13 DIAGNOSIS — Z13820 Encounter for screening for osteoporosis: Secondary | ICD-10-CM | POA: Diagnosis not present

## 2017-04-13 DIAGNOSIS — Z01419 Encounter for gynecological examination (general) (routine) without abnormal findings: Secondary | ICD-10-CM | POA: Diagnosis not present

## 2017-04-16 DIAGNOSIS — F4323 Adjustment disorder with mixed anxiety and depressed mood: Secondary | ICD-10-CM | POA: Diagnosis not present

## 2017-05-01 DIAGNOSIS — F4323 Adjustment disorder with mixed anxiety and depressed mood: Secondary | ICD-10-CM | POA: Diagnosis not present

## 2017-05-26 DIAGNOSIS — N951 Menopausal and female climacteric states: Secondary | ICD-10-CM | POA: Diagnosis not present

## 2017-05-26 DIAGNOSIS — R6882 Decreased libido: Secondary | ICD-10-CM | POA: Diagnosis not present

## 2017-05-29 ENCOUNTER — Encounter: Payer: Self-pay | Admitting: Internal Medicine

## 2017-06-06 ENCOUNTER — Other Ambulatory Visit: Payer: Self-pay | Admitting: Internal Medicine

## 2017-07-07 DIAGNOSIS — Z79890 Hormone replacement therapy (postmenopausal): Secondary | ICD-10-CM | POA: Diagnosis not present

## 2017-07-07 DIAGNOSIS — R6882 Decreased libido: Secondary | ICD-10-CM | POA: Diagnosis not present

## 2017-07-07 DIAGNOSIS — N6019 Diffuse cystic mastopathy of unspecified breast: Secondary | ICD-10-CM | POA: Diagnosis not present

## 2017-08-14 ENCOUNTER — Ambulatory Visit: Payer: BLUE CROSS/BLUE SHIELD | Admitting: Family Medicine

## 2017-08-24 ENCOUNTER — Other Ambulatory Visit: Payer: Self-pay | Admitting: Internal Medicine

## 2017-08-26 ENCOUNTER — Ambulatory Visit (INDEPENDENT_AMBULATORY_CARE_PROVIDER_SITE_OTHER)
Admission: RE | Admit: 2017-08-26 | Discharge: 2017-08-26 | Disposition: A | Discharge status: Home / Self Care | Payer: BLUE CROSS/BLUE SHIELD | Source: Ambulatory Visit | Attending: Family Medicine | Admitting: Family Medicine

## 2017-08-26 ENCOUNTER — Other Ambulatory Visit: Payer: Self-pay | Admitting: Family Medicine

## 2017-08-26 ENCOUNTER — Encounter: Payer: Self-pay | Admitting: Family Medicine

## 2017-08-26 ENCOUNTER — Ambulatory Visit: Payer: BLUE CROSS/BLUE SHIELD | Admitting: Family Medicine

## 2017-08-26 VITALS — BP 114/90 | HR 88 | Ht 66.0 in | Wt 139.0 lb

## 2017-08-26 DIAGNOSIS — M5441 Lumbago with sciatica, right side: Secondary | ICD-10-CM | POA: Diagnosis not present

## 2017-08-26 DIAGNOSIS — G8929 Other chronic pain: Secondary | ICD-10-CM

## 2017-08-26 DIAGNOSIS — M549 Dorsalgia, unspecified: Secondary | ICD-10-CM | POA: Diagnosis not present

## 2017-08-26 DIAGNOSIS — M5442 Lumbago with sciatica, left side: Secondary | ICD-10-CM

## 2017-08-26 DIAGNOSIS — M542 Cervicalgia: Secondary | ICD-10-CM | POA: Diagnosis not present

## 2017-08-26 MED ORDER — TRAMADOL HCL 50 MG PO TABS: 50.0000 mg | ORAL_TABLET | Freq: Four times a day (QID) | ORAL | 1 refills | Status: AC | PRN

## 2017-08-26 MED ORDER — TRAMADOL HCL 50 MG PO TABS: 25.0000 mg | ORAL_TABLET | Freq: Two times a day (BID) | ORAL | 1 refills | Status: DC | PRN

## 2017-08-26 MED ORDER — GABAPENTIN 300 MG PO CAPS: ORAL_CAPSULE | ORAL | 3 refills | Status: DC

## 2017-08-26 NOTE — Patient Instructions (Signed)
Good to see you  [name] is your friend. Ice 20 minutes 2 times daily. Usually after activity and before bed. Exercises 3 times a week.  Increase gabapentin to 300mg  at night Stop the Cymbalta when you can  Xrays downstairs.  Choline 500mg  daily, B12 1054mcg daily and B6 100mg  daily if still hvin gtrouble with higher functioning Want to give you 2 weeks off the cymbalta before we consider MRI of the head or neurology  See me again for the back in 3-6 weeks

## 2017-08-26 NOTE — Progress Notes (Signed)
[facility] [address] [place], [place] [address] Phone: [phone] Subjective:     CC: Bilateral leg pain  DXA:JOINOMVEHM  Rebecca Marks is a 62 y.o. female coming in with complaint of bilateral leg pain. Occasional mild tingling in toes.   Onset- Proximal thigh  Location mostly in the anterior thighs that radiates to the legs.  Has noted stiffness in the back. Character- Throbs, constant, noticable Aggravating factors- Yard work Reliving factors- Tramadol  Therapies tried-only the medication so far.  200 mg of gabapentin at night Severity-7 out of 10.     Past Medical History:  Diagnosis Date  . Allergy   . Anxiety   . Arthritis    R wrist and R knee  . CAP (community acquired pneumonia) 4/17-22/15   Rochephin & IV Zithromax & 3 days Levaquin  . Chronic back pain   . DVT (deep venous thrombosis) ([place])    a. in her 20s while on OCPs.  . Hepatitis C    contaminated blood @ her job, prior notes indicate spontaneously cleared  . History of tobacco abuse   . Osteoarthritis   . Palpitations   . Premature atrial contractions    a. Holter 11/2013: few PACs.  . Pulmonary function study abnormality    a. 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  . Raynaud phenomenon   . Ruptured lumbar disc    4 herniated disc   . Volume overload    a.  history of volume overload during hospital stay for PNA in 08/2013 felt iatrogenic due to receiving a lot of IV fluid. 2D echo 08/2013 showed EF 09-47%, normal diastolic function parameters.   Past Surgical History:  Procedure Laterality Date  . CHOLECYSTECTOMY  1985   Social History   Socioeconomic History  . Marital status: Married    Spouse name: Not on file  . Number of children: Not on file  . Years of education: Not on file  . Highest education level: Not on file  Occupational History  . Not on file  Social Needs  . Financial resource strain: Not on file  . Food insecurity:      Worry: Not on file    Inability: Not on file  . Transportation needs:    Medical: Not on file    Non-medical: Not on file  Tobacco Use  . Smoking status: Former Smoker    Packs/day: 0.25    Years: 25.00    Pack years: 6.25    Types: Cigarettes    Last attempt to quit: 08/19/2013    Years since quitting: 4.0  . Smokeless tobacco: Never Used  . Tobacco comment: smoked 1979-2008, up to 1 ppd. As of 08/01/13 1 cig every 2 weeks  Substance and Sexual Activity  . Alcohol use: No    Alcohol/week: 0.0 oz  . Drug use: No    Comment: History-long time ago  . Sexual activity: Yes    Birth control/protection: None  Lifestyle  . Physical activity:    Days per week: Not on file    Minutes per session: Not on file  . Stress: Not on file  Relationships  . Social connections:    Talks on phone: Not on file    Gets together: Not on file    Attends religious service: Not on file    Active member of club or organization: Not on file    Attends meetings of clubs or organizations: Not on file  Relationship status: Not on file  Other Topics Concern  . Not on file  Social History Narrative  . Not on file   Allergies  Allergen Reactions  . Ritalin [Methylphenidate Hcl]     Too hyper   Family History  Problem Relation Age of Onset  . Diabetes Father   . Hypertension Father   . Heart attack Father        >55  . Lymphoma Father   . Stroke Mother 14       after bedridden post fall with BLE paralysis  . Breast cancer Mother         X 3  . Breast cancer Sister   . Alzheimer's disease Maternal Grandmother   . Stroke Maternal Grandfather        in 27s  . Colon cancer Neg Hx      Past medical history, social, surgical and family history all reviewed in electronic medical record.  No pertanent information unless stated regarding to the chief complaint.   Review of Systems:Review of systems updated and as accurate as of 08/26/17  No headache, visual changes, nausea, vomiting,  diarrhea, constipation, dizziness, abdominal pain, skin rash, fevers, chills, night sweats, weight loss, swollen lymph nodes, body aches, joint swelling, chest pain, shortness of breath, mood changes.  Positive muscle aches  Objective  Blood pressure 114/90, pulse 88, height 5\' 6"  (1.676 m), weight 139 lb (63 kg), SpO2 97 %. Systems examined below as of 08/26/17   General: No apparent distress alert and oriented x3 mood and affect normal, dressed appropriately.  HEENT: Pupils equal, extraocular movements intact  Respiratory: Patient's speak in full sentences and does not appear short of breath  Cardiovascular: No lower extremity edema, non tender, no erythema  Skin: Warm dry intact with no signs of infection or rash on extremities or on axial skeleton.  Abdomen: Soft nontender  Neuro: Cranial nerves II through XII are intact, neurovascularly intact in all extremities with 2+ DTRs and 2+ pulses.  Lymph: No lymphadenopathy of posterior or anterior cervical chain or axillae bilaterally.  Gait normal with good balance and coordination.  MSK:  Non tender with full range of motion and good stability and symmetric strength and tone of shoulders, elbows, wrist, hip, knee and ankles bilaterally.   Back exam shows loss of lordosis.  Patient does have minimal rotation bilaterally.  Significant tightness in the hamstrings bilaterally.  Extension of only 5 degrees.  Negative Faber test bilaterally.  Diffuse tenderness to palpation the paraspinal musculature lumbar spine       Impression and Recommendations:     This case required medical decision making of moderate complexity.      Note: This dictation was prepared with Dragon dictation along with smaller phrase technology. Any transcriptional errors that result from this process are unintentional.

## 2017-08-26 NOTE — Assessment & Plan Note (Signed)
Concern the patient is having more of a lumbar radiculopathy likely has some underlying spinal stenosis.  X-rays ordered today.  Discussed icing regimen and home exercises.  Refill tramadol for breakthrough pain.  Discussed the possibility of addiction risk and complications.  Patient will continue to be active.  Follow-up with me again 3-4 weeks.  Possible advanced imaging and epidurals may be necessary.

## 2017-09-15 DIAGNOSIS — F4323 Adjustment disorder with mixed anxiety and depressed mood: Secondary | ICD-10-CM | POA: Diagnosis not present

## 2017-09-17 ENCOUNTER — Ambulatory Visit: Payer: BLUE CROSS/BLUE SHIELD | Admitting: Family Medicine

## 2017-09-17 ENCOUNTER — Other Ambulatory Visit: Payer: Self-pay | Admitting: Family Medicine

## 2017-09-17 NOTE — Telephone Encounter (Signed)
Refill done.  

## 2017-09-30 ENCOUNTER — Other Ambulatory Visit: Payer: Self-pay | Admitting: Internal Medicine

## 2017-10-09 ENCOUNTER — Ambulatory Visit: Payer: BLUE CROSS/BLUE SHIELD | Admitting: Physician Assistant

## 2017-10-09 ENCOUNTER — Encounter: Payer: Self-pay | Admitting: Physician Assistant

## 2017-10-09 ENCOUNTER — Other Ambulatory Visit: Payer: Self-pay

## 2017-10-09 VITALS — BP 112/62 | HR 103 | Temp 99.0°F | Resp 18 | Ht 66.0 in | Wt 135.2 lb

## 2017-10-09 DIAGNOSIS — M25561 Pain in right knee: Secondary | ICD-10-CM | POA: Diagnosis not present

## 2017-10-09 DIAGNOSIS — M25562 Pain in left knee: Secondary | ICD-10-CM

## 2017-10-09 MED ORDER — HYDROCODONE-ACETAMINOPHEN 5-325 MG PO TABS: 1.0000 | ORAL_TABLET | Freq: Four times a day (QID) | ORAL | 0 refills | Status: DC | PRN

## 2017-10-09 NOTE — Patient Instructions (Signed)
     IF you received an x-ray today, you will receive an invoice from [facility]. Please contact [facility] at [phone] with questions or concerns regarding your invoice.   IF you received labwork today, you will receive an invoice from [facility]. Please contact [facility] at [phone] with questions or concerns regarding your invoice.   Our billing staff will not be able to assist you with questions regarding bills from these companies.  You will be contacted with the lab results as soon as they are available. The fastest way to get your results is to activate your My Chart account. Instructions are located on the last page of this paperwork. If you have not heard from us regarding the results in 2 weeks, please contact this office.     

## 2017-10-09 NOTE — Progress Notes (Signed)
Rebecca Marks  MRN: [id number] DOB: Jun 28, 1955  PCP: Cassandria Anger, MD  Chief Complaint  Patient presents with  . Knee Injury    almost a week kneeled down to look under bed and knees have been hurting ever since     Subjective:  Pt presents to clinic for bilateral knee pain that started 1 week ago after she knelt quickly on a hotel floor to look under a bed.  She does not remember twisting or tweaking her knees in any way.  The next morning she woke up and had bilateral anterior knee pain.  She feels like both knees are equal in their pain.  The pain is a constant sharp stabbing pain.  Pain is worse with going up stairs compared to going down stairs.  Pain is worse when getting up out of a chair versus sitting down in a chair.  She has never had anything like this before.  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  She has had no swelling, no erythema, no fevers chills or feeling bad systemically.  She has never had gout.  She has no different pain in any other joints that she can identify.  Taking motrin 800mg  every 8 hours - no help.  Volatren cream with no relief.  Some relief at night she is able to sleep.  Worse when going up steps, not noticed pain going down steps.  Walks for exercise.  She lives in the house with 2 sets of steps and goes up and down multiple times a day.  History is obtained by patient.  Review of Systems  Constitutional: Negative for chills and fever.  Musculoskeletal: Positive for gait problem (2nd to knee pain with change in positions). Negative for joint swelling.    Patient Active Problem List   Diagnosis Date Noted  . Travel advice encounter 08/31/2016  . Insomnia 08/26/2016  . Postconcussive syndrome 07/24/2016  . Somnolence 07/23/2016  . Syncope and collapse 07/01/2016  . Concussion 07/01/2016  . Febrile illness, acute 07/01/2016  . Respiratory infection 05/20/2016  . Stress at home  02/08/2016  . Vitamin D deficiency 09/11/2015  . Routine general medical examination at a health care facility 09/11/2015  . Allergic rhinitis due to pollen 09/11/2015  . Premature atrial contractions   . Chronic low back pain 08/21/2014  . Nonallopathic lesion of lumbosacral region 08/21/2014  . Nonallopathic lesion of thoracic region 08/21/2014  . Nonallopathic lesion of sacral region 08/21/2014  . Osteoarthritis 08/02/2013  . Raynaud phenomenon 08/02/2013  . Hep C w/o coma, chronic ([place]) 05/24/2010    Current Outpatient Medications on File Prior to Visit  Medication Sig Dispense Refill  . Ascorbic Acid (VITAMIN C) 1000 MG tablet Take 2,000 mg by mouth 2 (two) times daily.     . Biotin 5000 MCG CAPS Take 1 capsule by mouth daily.    . Cholecalciferol (VITAMIN D3) 2000 UNITS TABS Take 2 tablets by mouth daily.    . Coenzyme Q10 200 MG capsule Take 200 mg by mouth daily.    [name] CORAL CALCIUM PO Take by mouth. 1000mg -3000mg  daily.    . cyclobenzaprine (FLEXERIL) 10 MG tablet Take 7.5 mg by mouth at bedtime.    [name] doxepin (SINEQUAN) 10 MG capsule TAKE 1 CAPSULE BY MOUTH AT BEDTIME AS NEEDED 90 capsule 5  . gabapentin (NEURONTIN) 300 MG capsule TAKE ONE CAPSULE BY MOUTH NIGHTLY 90 capsule 1  . Omega-3 Fatty Acids (ULTRA OMEGA  3 PO) Take by mouth. 1-2 grams daily.    [name] PHOSPHATIDYLSERINE PO Take 300 mg by mouth daily.     . VOLTAREN 1 % GEL Apply 1 application topically at bedtime. Applied to knee, hand, fingers.     No current facility-administered medications on file prior to visit.     Allergies  Allergen Reactions  . Ritalin [Methylphenidate Hcl]     Too hyper    Past Medical History:  Diagnosis Date  . Allergy   . Anxiety   . Arthritis    R wrist and R knee  . CAP (community acquired pneumonia) 4/17-22/15   Rochephin & IV Zithromax & 3 days Levaquin  . Chronic back pain   . DVT (deep venous thrombosis) ([place])    a. in her 20s while on OCPs.  . Hepatitis C    contaminated  blood @ her job, prior notes indicate spontaneously cleared  . History of tobacco abuse   . Osteoarthritis   . Palpitations   . Premature atrial contractions    a. Holter 11/2013: few PACs.  . Pulmonary function study abnormality    a. 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  . Raynaud phenomenon   . Ruptured lumbar disc    4 herniated disc   . Volume overload    a.  history of volume overload during hospital stay for PNA in 08/2013 felt iatrogenic due to receiving a lot of IV fluid. 2D echo 08/2013 showed EF 65-03%, normal diastolic function parameters.   Social History   Social History Narrative  . Not on file   Social History   Tobacco Use  . Smoking status: Former Smoker    Packs/day: 0.25    Years: 25.00    Pack years: 6.25    Types: Cigarettes    Last attempt to quit: 08/19/2013    Years since quitting: 4.1  . Smokeless tobacco: Never Used  . Tobacco comment: smoked 1979-2008, up to 1 ppd. As of 08/01/13 1 cig every 2 weeks  Substance Use Topics  . Alcohol use: No    Alcohol/week: 0.0 oz  . Drug use: No    Comment: History-long time ago   family history includes Alzheimer's disease in her maternal grandmother; Breast cancer in her mother and sister; Diabetes in her father; Heart attack in her father; Hypertension in her father; Lymphoma in her father; Stroke in her maternal grandfather; Stroke (age of onset: 2) in her mother.     Objective:  BP 112/62   Pulse (!) 103   Temp 99 F (37.2 C) (Oral)   Resp 18   Ht 5\' 6"  (1.676 m)   Wt 135 lb 3.2 oz (61.3 kg)   SpO2 96%   BMI 21.82 kg/m  Body mass index is 21.82 kg/m.  Physical Exam  Constitutional: She is oriented to person, place, and time. She appears well-developed and well-nourished.  Appears to have pain when getting out of chair, and up onto the table that has a step.  Once she gets walking she seem better, though she does have a slight limp.  HENT:  Head: Normocephalic and atraumatic.    Right Ear: Hearing and external ear normal.  Left Ear: Hearing and external ear normal.  Eyes: Conjunctivae are normal.  Neck: Normal range of motion.  Pulmonary/Chest: Effort normal.  Musculoskeletal:       Right knee: She exhibits erythema. She exhibits normal range of motion, no swelling, no effusion, no ecchymosis, normal patellar mobility,  normal meniscus and no MCL laxity. Tenderness found. Medial joint line (mild at the anterior aspect of the joint closet to the patella tendon) and patellar tendon (very TTP - pt pulls away) tenderness noted. No lateral joint line and no MCL tenderness noted.       Left knee: She exhibits normal range of motion, no swelling, no effusion, no ecchymosis, no erythema, no LCL laxity, normal patellar mobility, no bony tenderness, normal meniscus and no MCL laxity. Tenderness found. Medial joint line (anterior aspect only near patella tendon) and patellar tendon (very tender to palpation) tenderness noted. No lateral joint line, no MCL and no LCL tenderness noted.  Neurological: She is alert and oriented to person, place, and time. Gait abnormal.  Skin: Skin is warm, dry and intact.  Psychiatric: She has a normal mood and affect. Her behavior is normal. Judgment and thought content normal.  Vitals reviewed.   Assessment and Plan :  Bilateral anterior knee pain - Plan: HYDROcodone-acetaminophen (NORCO/VICODIN) 5-325 MG tablet, DISCONTINUED: HYDROcodone-acetaminophen (NORCO/VICODIN) 5-325 MG tablet   Patient has had no significant trauma to knees.  I suspect she either has a patellar tendinitis or a in superior patellar bursitis.  But this is difficult due to no injury or activity that would explain these symptoms.  I do not believe imaging will be helpful at this time.  She has tried anti-inflammatories both topically and orally without any relief.  She does have an appointment with Dr. Gardenia Phlegm in 1 week but she stated she could not wait until that appointment.   We will start her on hydrocodone to help with pain control, she will continue Motrin and Voltaren topically.  She will elevate and ice her knees.  Discussed with Dr. Reginia Forts.  She agrees with plan.  Patient verbalized to me that she understood the following: their diagnosis, what is being done for it, what to expect and what should be done at home.  See after visit summary for patient specific instructions.   Windell Hummingbird PA-C  [facility] 10/09/2017 6:27 PM

## 2017-10-14 NOTE — Progress Notes (Signed)
[name] [address] [place], [place] [address] Phone: [phone] Subjective:       CC: Knee pain, back pain  RJJ:[id number]  Rebecca Marks is a 62 y.o. female coming in with complaint of bilateral knee pain. Kneeled to look under bed. Last Thursday went to urgent care. Numbness and tingling bilaterally 2-4th toes.  Initially seen in the urgent care facility.  Was concern for more of a patella tendinitis.  Onset- May 19th  Location- Inferior patellar  Duration-  Character- Sharp constant pain Aggravating factors- Steps Reliving factors- Heat, Voltaren, hydrocodone   Therapies tried-  Severity-7 out of 10 initially.     Past Medical History:  Diagnosis Date  . Allergy   . Anxiety   . Arthritis    R wrist and R knee  . CAP (community acquired pneumonia) 4/17-22/15   Rochephin & IV Zithromax & 3 days Levaquin  . Chronic back pain   . DVT (deep venous thrombosis) ([place])    a. in her 20s while on OCPs.  . Hepatitis C    contaminated blood @ her job, prior notes indicate spontaneously cleared  . History of tobacco abuse   . Osteoarthritis   . Palpitations   . Premature atrial contractions    a. Holter 11/2013: few PACs.  . Pulmonary function study abnormality    a. 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  . Raynaud phenomenon   . Ruptured lumbar disc    4 herniated disc   . Volume overload    a.  history of volume overload during hospital stay for PNA in 08/2013 felt iatrogenic due to receiving a lot of IV fluid. 2D echo 08/2013 showed EF 16-01%, normal diastolic function parameters.   Past Surgical History:  Procedure Laterality Date  . CHOLECYSTECTOMY  1985   Social History   Socioeconomic History  . Marital status: Married    Spouse name: Not on file  . Number of children: Not on file  . Years of education: Not on file  . Highest education level: Not on file  Occupational History  . Not on file  Social Needs   . Financial resource strain: Not on file  . Food insecurity:    Worry: Not on file    Inability: Not on file  . Transportation needs:    Medical: Not on file    Non-medical: Not on file  Tobacco Use  . Smoking status: Former Smoker    Packs/day: 0.25    Years: 25.00    Pack years: 6.25    Types: Cigarettes    Last attempt to quit: 08/19/2013    Years since quitting: 4.1  . Smokeless tobacco: Never Used  . Tobacco comment: smoked 1979-2008, up to 1 ppd. As of 08/01/13 1 cig every 2 weeks  Substance and Sexual Activity  . Alcohol use: No    Alcohol/week: 0.0 oz  . Drug use: No    Comment: History-long time ago  . Sexual activity: Yes    Birth control/protection: None  Lifestyle  . Physical activity:    Days per week: Not on file    Minutes per session: Not on file  . Stress: Not on file  Relationships  . Social connections:    Talks on phone: Not on file    Gets together: Not on file    Attends religious service: Not on file    Active member of club or organization: Not on file  Attends meetings of clubs or organizations: Not on file    Relationship status: Not on file  Other Topics Concern  . Not on file  Social History Narrative  . Not on file   Allergies  Allergen Reactions  . Ritalin [Methylphenidate Hcl]     Too hyper   Family History  Problem Relation Age of Onset  . Diabetes Father   . Hypertension Father   . Heart attack Father        >55  . Lymphoma Father   . Stroke Mother 84       after bedridden post fall with BLE paralysis  . Breast cancer Mother         X 3  . Breast cancer Sister   . Alzheimer's disease Maternal Grandmother   . Stroke Maternal Grandfather        in 57s  . Colon cancer Neg Hx      Past medical history, social, surgical and family history all reviewed in electronic medical record.  No pertanent information unless stated regarding to the chief complaint.   Review of Systems:Review of systems updated and as accurate as of  10/15/17  No headache, visual changes, nausea, vomiting, diarrhea, constipation, dizziness, abdominal pain, skin rash, fevers, chills, night sweats, weight loss, swollen lymph nodes,, chest pain, shortness of breath, mood changes.  Positive body aches, muscle aches  Objective  Blood pressure 100/80, height 5\' 6"  (1.676 m), weight 135 lb (61.2 kg). Systems examined below as of 10/15/17   General: No apparent distress alert and oriented x3 mood and affect normal, dressed appropriately.  HEENT: Pupils equal, extraocular movements intact  Respiratory: Patient's speak in full sentences and does not appear short of breath  Cardiovascular: No lower extremity edema, non tender, no erythema  Skin: Warm dry intact with no signs of infection or rash on extremities or on axial skeleton.  Abdomen: Soft nontender  Neuro: Cranial nerves II through XII are intact, neurovascularly intact in all extremities with 2+ DTRs and 2+ pulses.  Lymph: No lymphadenopathy of posterior or anterior cervical chain or axillae bilaterally.  Gait normal with good balance and coordination.  MSK:  tender with full range of motion and good stability and symmetric strength and tone of shoulders, elbows, wrist, hip, and ankles bilaterally.  Bilateral knee exam shows the patient does have some mild degenerative changes.  No significant instability.  Pain more over the peds anserine area bilaterally right greater than left.  Tightness of the hamstrings bilaterally.  Full strength of the quadricep.  No tenderness over the patella tendon itself.    Impression and Recommendations:     This case required medical decision making of moderate complexity.      Note: This dictation was prepared with Dragon dictation along with smaller phrase technology. Any transcriptional errors that result from this process are unintentional.

## 2017-10-15 ENCOUNTER — Encounter

## 2017-10-15 ENCOUNTER — Ambulatory Visit: Payer: BLUE CROSS/BLUE SHIELD | Admitting: Family Medicine

## 2017-10-15 ENCOUNTER — Encounter: Payer: Self-pay | Admitting: Family Medicine

## 2017-10-15 DIAGNOSIS — M7050 Other bursitis of knee, unspecified knee: Secondary | ICD-10-CM

## 2017-10-15 MED ORDER — VENLAFAXINE HCL ER 37.5 MG PO CP24: 37.5000 mg | ORAL_CAPSULE | Freq: Every day | ORAL | 1 refills | Status: DC

## 2017-10-15 NOTE — Patient Instructions (Signed)
Good to see you  Lets try effexor 37.5 mg daily  Thigh compression sleeve with a lot of activity for pes anserine bursitis  Exercises 3 times a week.  Keep doing everything else See em again in 4 weeks

## 2017-10-15 NOTE — Assessment & Plan Note (Signed)
Home exercises given.  Discussed hamstring strength.  Discussed eccentric's.  Discussed padding the area when kneeling for a long amount of time.  Patient will avoid certain activities.  Follow-up again in 4 to 8 weeks

## 2017-10-24 ENCOUNTER — Other Ambulatory Visit: Payer: Self-pay | Admitting: Family Medicine

## 2017-10-26 MED ORDER — VENLAFAXINE HCL ER 37.5 MG PO CP24: 37.5000 mg | ORAL_CAPSULE | Freq: Every day | ORAL | 1 refills | Status: DC

## 2017-10-26 NOTE — Telephone Encounter (Signed)
Refill done.  

## 2017-10-26 NOTE — Addendum Note (Signed)
Addended by: Douglass Rivers T on: 10/26/2017 01:28 PM   Modules accepted: Orders

## 2017-11-12 ENCOUNTER — Telehealth: Payer: Self-pay | Admitting: Physician Assistant

## 2017-11-12 NOTE — Telephone Encounter (Signed)
Patient states she is having constipation problems and wants to speak with nurse about advice until appt with Dr.Armbruster on 9.4.19.

## 2017-11-12 NOTE — Telephone Encounter (Signed)
Left message to call back  

## 2017-11-16 ENCOUNTER — Ambulatory Visit: Payer: BLUE CROSS/BLUE SHIELD | Admitting: Family Medicine

## 2017-12-24 DIAGNOSIS — F4323 Adjustment disorder with mixed anxiety and depressed mood: Secondary | ICD-10-CM | POA: Diagnosis not present

## 2018-01-20 ENCOUNTER — Ambulatory Visit: Payer: BLUE CROSS/BLUE SHIELD | Admitting: Gastroenterology

## 2018-01-26 DIAGNOSIS — F4323 Adjustment disorder with mixed anxiety and depressed mood: Secondary | ICD-10-CM | POA: Diagnosis not present

## 2018-02-26 ENCOUNTER — Telehealth: Payer: Self-pay | Admitting: Internal Medicine

## 2018-02-26 NOTE — Telephone Encounter (Signed)
Copied from [place] [id number]. Topic: Appointment Scheduling - Prior Auth Required for Appointment >> Feb 26, 2018  4:05 PM Blase Mess A wrote: Patient has been having Muscle Spasms and difficulty sleeping for several weeks and is out of her meds. Dr. Alain Marion does not have appts until the end of November patient is seeing if dr. Alain Marion would consider seeing her sooner. Patent call back number [phone]

## 2018-02-26 NOTE — Telephone Encounter (Signed)
Pt is seeing [facility] tomorrow but if pt needs to she can be working in

## 2018-02-27 ENCOUNTER — Encounter: Payer: Self-pay | Admitting: Family Medicine

## 2018-02-27 ENCOUNTER — Ambulatory Visit: Payer: BLUE CROSS/BLUE SHIELD | Admitting: Family Medicine

## 2018-02-27 VITALS — BP 112/74 | HR 77 | Temp 98.2°F | Wt 125.0 lb

## 2018-02-27 DIAGNOSIS — G8929 Other chronic pain: Secondary | ICD-10-CM

## 2018-02-27 DIAGNOSIS — M546 Pain in thoracic spine: Secondary | ICD-10-CM

## 2018-02-27 MED ORDER — CYCLOBENZAPRINE HCL 5 MG PO TABS: 5.0000 mg | ORAL_TABLET | Freq: Every evening | ORAL | 0 refills | Status: DC | PRN

## 2018-02-27 NOTE — Progress Notes (Signed)
HPI:  Using dictation device. Unfortunately this device frequently misinterprets words/phrases.  Acute visit for chronic back pain: -for many years, not worsening, no new symptoms -muscle spasms, pain, paraspinal muscles mostly thoracic, worse at night -was seeing Dr. Charlestine Night and her retired - she ran out of flexeril which was the only thing that would help her sleep at night with back spasms -called her PCP, but he doesn't have openings, so came her for refill of flexeril -takes gabapentin and prn tramdol (uses rarely) -understands risks and interactions, had not side effects even with 10mg  dose of flexeril in the past -no fevers, malaise, new symptoms  ROS: See pertinent positives and negatives per HPI.  Past Medical History:  Diagnosis Date  . Allergy   . Anxiety   . Arthritis    R wrist and R knee  . CAP (community acquired pneumonia) 4/17-22/15   Rochephin & IV Zithromax & 3 days Levaquin  . Chronic back pain   . DVT (deep venous thrombosis) ([place])    a. in her 20s while on OCPs.  . Hepatitis C    contaminated blood @ her job, prior notes indicate spontaneously cleared  . History of tobacco abuse   . Osteoarthritis   . Palpitations   . Premature atrial contractions    a. Holter 11/2013: few PACs.  . Pulmonary function study abnormality    a. 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  . Raynaud phenomenon   . Ruptured lumbar disc    4 herniated disc   . Volume overload    a.  history of volume overload during hospital stay for PNA in 08/2013 felt iatrogenic due to receiving a lot of IV fluid. 2D echo 08/2013 showed EF 81-19%, normal diastolic function parameters.    Past Surgical History:  Procedure Laterality Date  . CHOLECYSTECTOMY  1985    Family History  Problem Relation Age of Onset  . Diabetes Father   . Hypertension Father   . Heart attack Father        >55  . Lymphoma Father   . Stroke Mother 73       after bedridden post fall with BLE  paralysis  . Breast cancer Mother         X 3  . Breast cancer Sister   . Alzheimer's disease Maternal Grandmother   . Stroke Maternal Grandfather        in 53s  . Colon cancer Neg Hx     SOCIAL HX: see hpi   Current Outpatient Medications:  .  Ascorbic Acid (VITAMIN C) 1000 MG tablet, Take 2,000 mg by mouth 2 (two) times daily. , Disp: , Rfl:  .  Biotin 5000 MCG CAPS, Take 1 capsule by mouth daily., Disp: , Rfl:  .  Cholecalciferol (VITAMIN D3) 2000 UNITS TABS, Take 2 tablets by mouth daily., Disp: , Rfl:  .  Coenzyme Q10 200 MG capsule, Take 200 mg by mouth daily., Disp: , Rfl:  .  CORAL CALCIUM PO, Take by mouth. 1000mg -3000mg  daily., Disp: , Rfl:  .  doxepin (SINEQUAN) 10 MG capsule, TAKE 1 CAPSULE BY MOUTH AT BEDTIME AS NEEDED, Disp: 90 capsule, Rfl: 5 .  gabapentin (NEURONTIN) 300 MG capsule, TAKE ONE CAPSULE BY MOUTH NIGHTLY, Disp: 90 capsule, Rfl: 1 .  HYDROcodone-acetaminophen (NORCO/VICODIN) 5-325 MG tablet, Take 1 tablet by mouth every 6 (six) hours as needed for moderate pain., Disp: 20 tablet, Rfl: 0 .  Omega-3 Fatty Acids (ULTRA OMEGA 3 PO), Take  by mouth. 1-2 grams daily., Disp: , Rfl:  .  PHOSPHATIDYLSERINE PO, Take 300 mg by mouth daily. , Disp: , Rfl:  .  traMADol (ULTRAM) 50 MG tablet, Take 25 mg by mouth every 6 (six) hours as needed., Disp: , Rfl:  .  venlafaxine XR (EFFEXOR-XR) 37.5 MG 24 hr capsule, Take 1 capsule (37.5 mg total) by mouth daily with breakfast., Disp: 90 capsule, Rfl: 1 .  VOLTAREN 1 % GEL, Apply 1 application topically at bedtime. Applied to knee, hand, fingers., Disp: , Rfl:  .  cyclobenzaprine (FLEXERIL) 5 MG tablet, Take 1 tablet (5 mg total) by mouth at bedtime as needed for muscle spasms., Disp: 20 tablet, Rfl: 0  EXAM:  Vitals:   02/27/18 1043  BP: 112/74  Pulse: 77  Temp: 98.2 F (36.8 C)  SpO2: 98%    Body mass index is 20.18 kg/m.  GENERAL: vitals reviewed and listed above, alert, oriented, appears well hydrated and in no  acute distress  HEENT: atraumatic, conjunttiva clear, no obvious abnormalities on inspection of external nose and ears  NECK: no obvious masses on inspection  LUNGS: clear to auscultation bilaterally, no wheezes, rales or rhonchi, good air movement  CV: HRRR, no peripheral edema  MS: moves all extremities without noticeable abnormality, thoracic paraspinal muscle TTP, no bony TTP, normal gait, function of upper and lower extremities grossly intact  PSYCH: pleasant and cooperative, no obvious depression or anxiety  ASSESSMENT AND PLAN:  Discussed the following assessment and plan: More than 50% of over 25 minutes spent in total in caring for this patient was spent face-to-face with the patient, counseling and/or coordinating care.   Chronic thoracic back pain, unspecified back pain laterality - Plan: cyclobenzaprine (FLEXERIL) 5 MG tablet  -refilled flexeril after discussion risks, interactions - discussed various treatments for chronic back pain and risks, she has tried many -follow up with PCP for further refills -Patient advised to return or notify a doctor immediately if symptoms worsen or persist or new concerns arise.  There are no Patient Instructions on file for this visit.  Rebecca Kern, DO

## 2018-02-27 NOTE — Patient Instructions (Signed)
I sent is a small amount of the flexeril for you.  I hope you are feeling better soon.  Please follow up with your primary doctor for future refills and re-evaluation.

## 2018-03-03 ENCOUNTER — Encounter: Payer: Self-pay | Admitting: Internal Medicine

## 2018-03-03 ENCOUNTER — Ambulatory Visit: Payer: BLUE CROSS/BLUE SHIELD | Admitting: Internal Medicine

## 2018-03-03 VITALS — BP 106/70 | HR 81 | Temp 98.1°F | Ht 66.0 in | Wt 124.0 lb

## 2018-03-03 DIAGNOSIS — M5442 Lumbago with sciatica, left side: Secondary | ICD-10-CM | POA: Diagnosis not present

## 2018-03-03 DIAGNOSIS — F419 Anxiety disorder, unspecified: Secondary | ICD-10-CM

## 2018-03-03 DIAGNOSIS — G8929 Other chronic pain: Secondary | ICD-10-CM

## 2018-03-03 DIAGNOSIS — M5441 Lumbago with sciatica, right side: Secondary | ICD-10-CM

## 2018-03-03 DIAGNOSIS — F5101 Primary insomnia: Secondary | ICD-10-CM | POA: Diagnosis not present

## 2018-03-03 MED ORDER — CYCLOBENZAPRINE HCL 10 MG PO TABS: 10.0000 mg | ORAL_TABLET | Freq: Every day | ORAL | 5 refills | Status: DC

## 2018-03-03 MED ORDER — ZALEPLON 10 MG PO CAPS: 10.0000 mg | ORAL_CAPSULE | Freq: Every evening | ORAL | 1 refills | Status: DC | PRN

## 2018-03-03 NOTE — Progress Notes (Signed)
Subjective:    Patient ID: Rebecca Marks, female    DOB: May 19, 1956, 62 y.o.   MRN: [id number]  HPI  Here to f/u with c/o Pt continues to have recurring LBP without change in severity, bowel or bladder change, fever, wt loss,  worsening LE pain/numbness/weakness, gait change or falls.  Has known hx of lumbar DDD and spinal stenosis, is asking for flexeril 10 qhs as cannot tolerate during day, but working well at bedtime.  Also has marked increased stress recently in last few months in dealing with family issues.  Denies worsening depressive symptoms, suicidal ideation, or panic; this has resulted in marked difficulty sleeping most night in getting to sleep and staying asleep.  Pt denies chest pain, increased sob or doe, wheezing, orthopnea, PND, increased LE swelling, palpitations, dizziness or syncope.   Past Medical History:  Diagnosis Date  . Allergy   . Anxiety   . Arthritis    R wrist and R knee  . CAP (community acquired pneumonia) 4/17-22/15   Rochephin & IV Zithromax & 3 days Levaquin  . Chronic back pain   . DVT (deep venous thrombosis) ([place])    a. in her 20s while on OCPs.  . Hepatitis C    contaminated blood @ her job, prior notes indicate spontaneously cleared  . History of tobacco abuse   . Osteoarthritis   . Palpitations   . Premature atrial contractions    a. Holter 11/2013: few PACs.  . Pulmonary function study abnormality    a. 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  . Raynaud phenomenon   . Ruptured lumbar disc    4 herniated disc   . Volume overload    a.  history of volume overload during hospital stay for PNA in 08/2013 felt iatrogenic due to receiving a lot of IV fluid. 2D echo 08/2013 showed EF 17-51%, normal diastolic function parameters.   Past Surgical History:  Procedure Laterality Date  . CHOLECYSTECTOMY  1985    reports that she quit smoking about 4 years ago. Her smoking use included cigarettes. She has a 6.25 pack-year smoking  history. She has never used smokeless tobacco. She reports that she does not drink alcohol or use drugs. family history includes Alzheimer's disease in her maternal grandmother; Breast cancer in her mother and sister; Diabetes in her father; Heart attack in her father; Hypertension in her father; Lymphoma in her father; Stroke in her maternal grandfather; Stroke (age of onset: 65) in her mother. Allergies  Allergen Reactions  . Ritalin [Methylphenidate Hcl]     Too hyper   Current Outpatient Medications on File Prior to Visit  Medication Sig Dispense Refill  . Ascorbic Acid (VITAMIN C) 1000 MG tablet Take 2,000 mg by mouth 2 (two) times daily.     . Biotin 5000 MCG CAPS Take 1 capsule by mouth daily.    . Cholecalciferol (VITAMIN D3) 2000 UNITS TABS Take 2 tablets by mouth daily.    . Coenzyme Q10 200 MG capsule Take 200 mg by mouth daily.    [name] CORAL CALCIUM PO Take by mouth. 1000mg -3000mg  daily.    [name] doxepin (SINEQUAN) 10 MG capsule TAKE 1 CAPSULE BY MOUTH AT BEDTIME AS NEEDED 90 capsule 5  . gabapentin (NEURONTIN) 300 MG capsule TAKE ONE CAPSULE BY MOUTH NIGHTLY 90 capsule 1  . HYDROcodone-acetaminophen (NORCO/VICODIN) 5-325 MG tablet Take 1 tablet by mouth every 6 (six) hours as needed for moderate pain. 20 tablet 0  . Omega-3 Fatty Acids (  ULTRA OMEGA 3 PO) Take by mouth. 1-2 grams daily.    [name] PHOSPHATIDYLSERINE PO Take 300 mg by mouth daily.     . traMADol (ULTRAM) 50 MG tablet Take 25 mg by mouth every 6 (six) hours as needed.    . venlafaxine XR (EFFEXOR-XR) 37.5 MG 24 hr capsule Take 1 capsule (37.5 mg total) by mouth daily with breakfast. 90 capsule 1  . VOLTAREN 1 % GEL Apply 1 application topically at bedtime. Applied to knee, hand, fingers.     No current facility-administered medications on file prior to visit.    Review of Systems  Constitutional: Negative for other unusual diaphoresis or sweats HENT: Negative for ear discharge or swelling Eyes: Negative for other worsening  visual disturbances Respiratory: Negative for stridor or other swelling  Gastrointestinal: Negative for worsening distension or other blood Genitourinary: Negative for retention or other urinary change Musculoskeletal: Negative for other MSK pain or swelling Skin: Negative for color change or other new lesions Neurological: Negative for worsening tremors and other numbness  Psychiatric/Behavioral: Negative for worsening agitation or other fatigue All other system neg per pt    Objective:   Physical Exam BP 106/70   Pulse 81   Temp 98.1 F (36.7 C) (Oral)   Ht 5\' 6"  (1.676 m)   Wt 124 lb (56.2 kg)   SpO2 98%   BMI 20.01 kg/m  VS noted,  Constitutional: Pt appears in NAD HENT: Head: NCAT.  Right Ear: External ear normal.  Left Ear: External ear normal.  Eyes: . Pupils are equal, round, and reactive to light. Conjunctivae and EOM are normal Nose: without d/c or deformity Neck: Neck supple. Gross normal ROM Cardiovascular: Normal rate and regular rhythm.   Pulmonary/Chest: Effort normal and breath sounds without rales or wheezing.  Abd:  Soft, NT, ND, + BS, no organomegaly Neurological: Pt is alert. At baseline orientation, motor grossly intact Skin: Skin is warm. No rashes, other new lesions, no LE edema Psychiatric: Pt behavior is normal without agitation , 1-2+ nervous No other exam findings  Lab Results  Component Value Date   WBC 7.0 02/12/2016   HGB 14.0 02/12/2016   HCT 39.8 02/12/2016   PLT 234.0 02/12/2016   GLUCOSE 92 02/12/2016   CHOL 152 02/12/2016   TRIG 172.0 (H) 02/12/2016   HDL 37.40 (L) 02/12/2016   LDLCALC 80 02/12/2016   ALT 4 02/12/2016   AST 15 02/12/2016   NA 140 02/12/2016   K 4.3 02/12/2016   CL 105 02/12/2016   CREATININE 0.75 02/12/2016   BUN 15 02/12/2016   CO2 30 02/12/2016   TSH 1.63 02/12/2016   INR 1.0 09/12/2015   HGBA1C 4.4 (L) 07/30/2006        Assessment & Plan:

## 2018-03-03 NOTE — Patient Instructions (Signed)
Please take all new medication as prescribed - the generic sonata  Please continue all other medications as before, and refills have been done if requested - the flexeril at bedtime  Please have the pharmacy call with any other refills you may need.  Please continue your efforts at being more active, low cholesterol diet, and weight control.  Please keep your appointments with your specialists as you may have planned

## 2018-03-03 NOTE — Assessment & Plan Note (Signed)
Ok for sonata 10 qhs prn, as this has worked before

## 2018-03-03 NOTE — Assessment & Plan Note (Signed)
Situational worsening recenlty, declines referral counseling or SSRI

## 2018-03-03 NOTE — Assessment & Plan Note (Signed)
Stable overall, but ok for flexeril 10 qhs prn for symptoms relief,  to f/u any worsening symptoms or concerns

## 2018-03-04 DIAGNOSIS — F4323 Adjustment disorder with mixed anxiety and depressed mood: Secondary | ICD-10-CM | POA: Diagnosis not present

## 2018-03-25 ENCOUNTER — Other Ambulatory Visit: Payer: Self-pay | Admitting: Family Medicine

## 2018-03-25 DIAGNOSIS — F4323 Adjustment disorder with mixed anxiety and depressed mood: Secondary | ICD-10-CM | POA: Diagnosis not present

## 2018-03-26 ENCOUNTER — Other Ambulatory Visit: Payer: Self-pay | Admitting: Internal Medicine

## 2018-03-26 NOTE — Telephone Encounter (Signed)
Needs OV.  

## 2018-04-14 DIAGNOSIS — Z6820 Body mass index (BMI) 20.0-20.9, adult: Secondary | ICD-10-CM | POA: Diagnosis not present

## 2018-04-14 DIAGNOSIS — Z01419 Encounter for gynecological examination (general) (routine) without abnormal findings: Secondary | ICD-10-CM | POA: Diagnosis not present

## 2018-04-18 ENCOUNTER — Other Ambulatory Visit: Payer: Self-pay | Admitting: Internal Medicine

## 2018-04-19 DIAGNOSIS — F4323 Adjustment disorder with mixed anxiety and depressed mood: Secondary | ICD-10-CM | POA: Diagnosis not present

## 2018-04-21 ENCOUNTER — Ambulatory Visit (INDEPENDENT_AMBULATORY_CARE_PROVIDER_SITE_OTHER): Payer: BLUE CROSS/BLUE SHIELD | Admitting: Internal Medicine

## 2018-04-21 ENCOUNTER — Encounter: Payer: Self-pay | Admitting: Internal Medicine

## 2018-04-21 ENCOUNTER — Other Ambulatory Visit (INDEPENDENT_AMBULATORY_CARE_PROVIDER_SITE_OTHER): Payer: BLUE CROSS/BLUE SHIELD

## 2018-04-21 VITALS — BP 114/74 | HR 86 | Temp 98.4°F | Ht 66.0 in | Wt 123.0 lb

## 2018-04-21 DIAGNOSIS — E785 Hyperlipidemia, unspecified: Secondary | ICD-10-CM

## 2018-04-21 DIAGNOSIS — Z23 Encounter for immunization: Secondary | ICD-10-CM | POA: Diagnosis not present

## 2018-04-21 DIAGNOSIS — Z0000 Encounter for general adult medical examination without abnormal findings: Secondary | ICD-10-CM

## 2018-04-21 DIAGNOSIS — M5441 Lumbago with sciatica, right side: Secondary | ICD-10-CM | POA: Diagnosis not present

## 2018-04-21 DIAGNOSIS — M5442 Lumbago with sciatica, left side: Secondary | ICD-10-CM

## 2018-04-21 DIAGNOSIS — F439 Reaction to severe stress, unspecified: Secondary | ICD-10-CM | POA: Diagnosis not present

## 2018-04-21 DIAGNOSIS — F5101 Primary insomnia: Secondary | ICD-10-CM

## 2018-04-21 DIAGNOSIS — G8929 Other chronic pain: Secondary | ICD-10-CM

## 2018-04-21 DIAGNOSIS — E559 Vitamin D deficiency, unspecified: Secondary | ICD-10-CM

## 2018-04-21 LAB — HEPATIC FUNCTION PANEL
ALBUMIN: 4.9 g/dL (ref 3.5–5.2)
ALT: 7 U/L (ref 0–35)
AST: 17 U/L (ref 0–37)
Alkaline Phosphatase: 75 U/L (ref 39–117)
Bilirubin, Direct: 0.1 mg/dL (ref 0.0–0.3)
TOTAL PROTEIN: 7.6 g/dL (ref 6.0–8.3)
Total Bilirubin: 0.5 mg/dL (ref 0.2–1.2)

## 2018-04-21 LAB — CBC WITH DIFFERENTIAL/PLATELET
BASOS PCT: 0.5 % (ref 0.0–3.0)
Basophils Absolute: 0 10*3/uL (ref 0.0–0.1)
EOS ABS: 0.1 10*3/uL (ref 0.0–0.7)
Eosinophils Relative: 0.7 % (ref 0.0–5.0)
HEMATOCRIT: 42.1 % (ref 36.0–46.0)
Hemoglobin: 15 g/dL (ref 12.0–15.0)
LYMPHS PCT: 32.1 % (ref 12.0–46.0)
Lymphs Abs: 2.9 10*3/uL (ref 0.7–4.0)
MCHC: 35.6 g/dL (ref 30.0–36.0)
MCV: 95 fl (ref 78.0–100.0)
MONOS PCT: 6.9 % (ref 3.0–12.0)
Monocytes Absolute: 0.6 10*3/uL (ref 0.1–1.0)
NEUTROS ABS: 5.5 10*3/uL (ref 1.4–7.7)
Neutrophils Relative %: 59.8 % (ref 43.0–77.0)
PLATELETS: 286 10*3/uL (ref 150.0–400.0)
RBC: 4.43 Mil/uL (ref 3.87–5.11)
RDW: 12.1 % (ref 11.5–15.5)
WBC: 9.1 10*3/uL (ref 4.0–10.5)

## 2018-04-21 LAB — LIPID PANEL
CHOLESTEROL: 164 mg/dL (ref 0–200)
HDL: 48.7 mg/dL (ref >39.00)
LDL Cholesterol: 90 mg/dL (ref 0–99)
NonHDL: 115.78
Total CHOL/HDL Ratio: 3
Triglycerides: 129 mg/dL (ref 0.0–149.0)
VLDL: 25.8 mg/dL (ref 0.0–40.0)

## 2018-04-21 LAB — VITAMIN D 25 HYDROXY (VIT D DEFICIENCY, FRACTURES): VITD: 51.67 ng/mL (ref 30.00–100.00)

## 2018-04-21 LAB — URINALYSIS
Bilirubin Urine: NEGATIVE
KETONES UR: NEGATIVE
LEUKOCYTES UA: NEGATIVE
Nitrite: NEGATIVE
PH: 5.5 (ref 5.0–8.0)
SPECIFIC GRAVITY, URINE: 1.02 (ref 1.000–1.030)
Total Protein, Urine: NEGATIVE
URINE GLUCOSE: NEGATIVE
UROBILINOGEN UA: 0.2 (ref 0.0–1.0)

## 2018-04-21 LAB — TSH: TSH: 2.04 u[IU]/mL (ref 0.35–4.50)

## 2018-04-21 LAB — BASIC METABOLIC PANEL
BUN: 22 mg/dL (ref 6–23)
CALCIUM: 9.9 mg/dL (ref 8.4–10.5)
CO2: 26 mEq/L (ref 19–32)
CREATININE: 0.76 mg/dL (ref 0.40–1.20)
Chloride: 103 mEq/L (ref 96–112)
GFR: 81.99 mL/min (ref >60.00)
Glucose, Bld: 104 mg/dL — ABNORMAL HIGH (ref 70–99)
Potassium: 4.2 mEq/L (ref 3.5–5.1)
Sodium: 138 mEq/L (ref 135–145)

## 2018-04-21 NOTE — Assessment & Plan Note (Addendum)
Labs Vit D 

## 2018-04-21 NOTE — Assessment & Plan Note (Signed)
We discussed age appropriate health related issues, including available/recomended screening tests and vaccinations. We discussed a need for adhering to healthy diet and exercise. Labs were ordered to be later reviewed . All questions were answered. PAP   cardiac CT scan for calcium scoring offered

## 2018-04-21 NOTE — Assessment & Plan Note (Signed)
Discussed.

## 2018-04-21 NOTE — Patient Instructions (Signed)

## 2018-04-21 NOTE — Progress Notes (Signed)
Subjective:  Patient ID: Rebecca Marks, female    DOB: May 07, 1956  Age: 62 y.o. MRN: [id number]  CC: No chief complaint on file.   HPI Rebecca Marks presents for a well exam C/o pain in the legs, LBP. Lost wt on keto diet C/o stress  Outpatient Medications Prior to Visit  Medication Sig Dispense Refill  . Ascorbic Acid (VITAMIN C) 1000 MG tablet Take 2,000 mg by mouth 2 (two) times daily.     . Biotin 5000 MCG CAPS Take 1 capsule by mouth daily.    . Cholecalciferol (VITAMIN D3) 2000 UNITS TABS Take 2 tablets by mouth daily.    . Coenzyme Q10 200 MG capsule Take 200 mg by mouth daily.    [name] CORAL CALCIUM PO Take by mouth. 1000mg -3000mg  daily.    . cyclobenzaprine (FLEXERIL) 10 MG tablet Take 1 tablet (10 mg total) by mouth at bedtime. As needed 30 tablet 5  . doxepin (SINEQUAN) 10 MG capsule TAKE 1 CAPSULE BY MOUTH EVERY DAY AT BEDTIME AS NEEDED 90 capsule 1  . gabapentin (NEURONTIN) 300 MG capsule TAKE ONE CAPSULE BY MOUTH NIGHTLY 90 capsule 1  . HYDROcodone-acetaminophen (NORCO/VICODIN) 5-325 MG tablet Take 1 tablet by mouth every 6 (six) hours as needed for moderate pain. 20 tablet 0  . ibuprofen (ADVIL,MOTRIN) 800 MG tablet Take 800 mg by mouth at bedtime as needed.    . Omega-3 Fatty Acids (ULTRA OMEGA 3 PO) Take by mouth. 1-2 grams daily.    [name] PHOSPHATIDYLSERINE PO Take 300 mg by mouth daily.     . traMADol (ULTRAM) 50 MG tablet Take 25 mg by mouth every 6 (six) hours as needed.    . venlafaxine XR (EFFEXOR-XR) 37.5 MG 24 hr capsule Take 1 capsule (37.5 mg total) by mouth daily with breakfast. 90 capsule 1  . VOLTAREN 1 % GEL Apply 1 application topically at bedtime. Applied to knee, hand, fingers.    . zaleplon (SONATA) 10 MG capsule Take 1 capsule (10 mg total) by mouth at bedtime as needed for sleep. 90 capsule 1   No facility-administered medications prior to visit.     ROS: Review of Systems  Constitutional: Negative for activity change, appetite change,  chills, fatigue and unexpected weight change.  HENT: Negative for congestion, mouth sores and sinus pressure.   Eyes: Negative for visual disturbance.  Respiratory: Negative for cough and chest tightness.   Gastrointestinal: Negative for abdominal pain and nausea.  Genitourinary: Negative for difficulty urinating, frequency and vaginal pain.  Musculoskeletal: Positive for arthralgias and back pain. Negative for gait problem.  Skin: Negative for pallor and rash.  Neurological: Negative for dizziness, tremors, weakness, numbness and headaches.  Psychiatric/Behavioral: Negative for confusion and sleep disturbance.    Objective:  BP 114/74 (BP Location: Left Arm, Patient Position: Sitting, Cuff Size: Normal)   Pulse 86   Temp 98.4 F (36.9 C) (Oral)   Ht 5\' 6"  (1.676 m)   Wt 123 lb (55.8 kg)   SpO2 99%   BMI 19.85 kg/m   BP Readings from Last 3 Encounters:  04/21/18 114/74  03/03/18 106/70  02/27/18 112/74    Wt Readings from Last 3 Encounters:  04/21/18 123 lb (55.8 kg)  03/03/18 124 lb (56.2 kg)  02/27/18 125 lb (56.7 kg)    Physical Exam  Constitutional: She appears well-developed. No distress.  HENT:  Head: Normocephalic.  Right Ear: External ear normal.  Left Ear: External ear normal.  Nose: Nose normal.  Mouth/Throat: Oropharynx  is clear and moist.  Eyes: Pupils are equal, round, and reactive to light. Conjunctivae are normal. Right eye exhibits no discharge. Left eye exhibits no discharge.  Neck: Normal range of motion. Neck supple. No JVD present. No tracheal deviation present. No thyromegaly present.  Cardiovascular: Normal rate, regular rhythm and normal heart sounds.  Pulmonary/Chest: No stridor. No respiratory distress. She has no wheezes.  Abdominal: Soft. Bowel sounds are normal. She exhibits no distension and no mass. There is no tenderness. There is no rebound and no guarding.  Musculoskeletal: She exhibits no edema or tenderness.  Lymphadenopathy:    She  has no cervical adenopathy.  Neurological: She displays normal reflexes. No cranial nerve deficit. She exhibits normal muscle tone. Coordination normal.  Skin: No rash noted. No erythema.  Psychiatric: She has a normal mood and affect. Her behavior is normal. Judgment and thought content normal.    Lab Results  Component Value Date   WBC 7.0 02/12/2016   HGB 14.0 02/12/2016   HCT 39.8 02/12/2016   PLT 234.0 02/12/2016   GLUCOSE 92 02/12/2016   CHOL 152 02/12/2016   TRIG 172.0 (H) 02/12/2016   HDL 37.40 (L) 02/12/2016   LDLCALC 80 02/12/2016   ALT 4 02/12/2016   AST 15 02/12/2016   NA 140 02/12/2016   K 4.3 02/12/2016   CL 105 02/12/2016   CREATININE 0.75 02/12/2016   BUN 15 02/12/2016   CO2 30 02/12/2016   TSH 1.63 02/12/2016   INR 1.0 09/12/2015   HGBA1C 4.4 (L) 07/30/2006    Dg Lumbar Spine Complete W/bend  Result Date: 08/27/2017 CLINICAL DATA:  Chronic back pain.  No known injury. EXAM: LUMBAR SPINE - COMPLETE WITH BENDING VIEWS COMPARISON:  No recent. FINDINGS: No acute bony abnormality identified. No evidence of fracture. Mild diffuse degenerative change. 10 mm anterolisthesis L4 on L5. IMPRESSION: No acute abnormality. Mild diffuse degenerative change. 10 mm anterolisthesis L4 on L5. Electronically Signed   By: Marcello Moores  Register   On: 08/27/2017 06:33    Assessment & Plan:   There are no diagnoses linked to this encounter.   No orders of the defined types were placed in this encounter.    Follow-up: No follow-ups on file.  Walker Kehr, MD

## 2018-04-21 NOTE — Assessment & Plan Note (Signed)
Tramadol low dose  Potential benefits of a long term opioids use as well as potential risks (i.e. addiction risk, apnea etc) and complications (i.e. Somnolence, constipation and others) were explained to the patient and were aknowledged.

## 2018-04-21 NOTE — Assessment & Plan Note (Signed)
Zaleplon prn   Potential benefits of a long term benzodiazepines  use as well as potential risks  and complications were explained to the patient and were aknowledged. 

## 2018-05-19 HISTORY — PX: OTHER SURGICAL HISTORY: SHX169

## 2018-06-09 ENCOUNTER — Other Ambulatory Visit: Payer: Self-pay

## 2018-06-09 ENCOUNTER — Ambulatory Visit: Payer: BLUE CROSS/BLUE SHIELD | Admitting: Emergency Medicine

## 2018-06-09 ENCOUNTER — Encounter

## 2018-06-09 ENCOUNTER — Encounter: Payer: Self-pay | Admitting: Emergency Medicine

## 2018-06-09 VITALS — BP 105/69 | HR 92 | Temp 99.0°F | Resp 16 | Ht 66.0 in | Wt 126.0 lb

## 2018-06-09 DIAGNOSIS — N390 Urinary tract infection, site not specified: Secondary | ICD-10-CM

## 2018-06-09 DIAGNOSIS — R300 Dysuria: Secondary | ICD-10-CM | POA: Diagnosis not present

## 2018-06-09 LAB — POCT URINALYSIS DIP (MANUAL ENTRY)
BILIRUBIN UA: NEGATIVE
BILIRUBIN UA: NEGATIVE mg/dL
GLUCOSE UA: NEGATIVE mg/dL
Nitrite, UA: NEGATIVE
PROTEIN UA: NEGATIVE mg/dL
SPEC GRAV UA: 1.01 (ref 1.010–1.025)
Urobilinogen, UA: 0.2 E.U./dL
pH, UA: 6.5 (ref 5.0–8.0)

## 2018-06-09 MED ORDER — NITROFURANTOIN MONOHYD MACRO 100 MG PO CAPS: 100.0000 mg | ORAL_CAPSULE | Freq: Two times a day (BID) | ORAL | 0 refills | Status: AC

## 2018-06-09 MED ORDER — FLUCONAZOLE 150 MG PO TABS: 150.0000 mg | ORAL_TABLET | Freq: Once | ORAL | 0 refills | Status: AC

## 2018-06-09 NOTE — Progress Notes (Signed)
Rebecca Marks 63 y.o.   Chief Complaint  Patient presents with  . Dysuria    with some pressure and odor x 1 weeks     HISTORY OF PRESENT ILLNESS: This is a 63 y.o. female complaining of urinary symptoms for 7 days.  Complaining of burning on urination and bad odor.  Denies nausea or vomiting.  Denies fever or chills.  Denies flank pain.  Has a history of UTI in 2018.  Urine culture grew E sensitive to all antibiotics.  Patient took 1 dose of Macrobid yesterday.  Dysuria   This is a new problem. The current episode started in the past 7 days. The problem has been gradually worsening. The quality of the pain is described as burning. The pain is at a severity of 3/10. The pain is mild. There has been no fever. There is no history of pyelonephritis. Associated symptoms include frequency and urgency. Pertinent negatives include no chills, discharge, flank pain, hematuria, nausea or vomiting. She has tried antibiotics for the symptoms. Her past medical history is significant for recurrent UTIs. There is no history of catheterization, kidney stones, urinary stasis or a urological procedure.     Prior to Admission medications   Medication Sig Start Date End Date Taking? Authorizing Provider  Ascorbic Acid (VITAMIN C) 1000 MG tablet Take 2,000 mg by mouth 2 (two) times daily.    Yes [provider]  Biotin 5000 MCG CAPS Take 1 capsule by mouth daily.   Yes [provider]  Cholecalciferol (VITAMIN D3) 2000 UNITS TABS Take 2 tablets by mouth daily.   Yes [provider]  Coenzyme Q10 200 MG capsule Take 200 mg by mouth daily.   Yes [provider]  CORAL CALCIUM PO Take by mouth. 1000mg -3000mg  daily.   Yes [provider]  cyclobenzaprine (FLEXERIL) 10 MG tablet Take 1 tablet (10 mg total) by mouth at bedtime. As needed 03/03/18  Yes Biagio Borg, MD  doxepin (SINEQUAN) 10 MG capsule TAKE 1 CAPSULE BY MOUTH EVERY DAY AT BEDTIME AS NEEDED 04/19/18   Yes Plotnikov, Evie Lacks, MD  gabapentin (NEURONTIN) 300 MG capsule TAKE ONE CAPSULE BY MOUTH NIGHTLY 03/25/18  Yes Hulan Saas M, DO  HYDROcodone-acetaminophen (NORCO/VICODIN) 5-325 MG tablet Take 1 tablet by mouth every 6 (six) hours as needed for moderate pain. 10/09/17  Yes Weber, Sarah L, PA-C  ibuprofen (ADVIL,MOTRIN) 800 MG tablet Take 800 mg by mouth at bedtime as needed.   Yes [provider]  Omega-3 Fatty Acids (ULTRA OMEGA 3 PO) Take by mouth. 1-2 grams daily.   Yes [provider]  PHOSPHATIDYLSERINE PO Take 300 mg by mouth daily.    Yes [provider]  traMADol (ULTRAM) 50 MG tablet Take 25 mg by mouth every 6 (six) hours as needed.   Yes [provider]  venlafaxine XR (EFFEXOR-XR) 37.5 MG 24 hr capsule Take 1 capsule (37.5 mg total) by mouth daily with breakfast. 10/26/17  Yes Lyndal Pulley, DO  VOLTAREN 1 % GEL Apply 1 application topically at bedtime. Applied to knee, hand, fingers. 07/31/13  Yes [provider]  zaleplon (SONATA) 10 MG capsule Take 1 capsule (10 mg total) by mouth at bedtime as needed for sleep. 03/03/18  Yes Biagio Borg, MD    Allergies  Allergen Reactions  . Ritalin [Methylphenidate Hcl]     Too hyper    Patient Active Problem List   Diagnosis Date Noted  . Well adult exam 04/21/2018  .  Anxiety 03/03/2018  . Anserine bursitis 10/15/2017  . Travel advice encounter 08/31/2016  . Insomnia 08/26/2016  . Postconcussive syndrome 07/24/2016  . Somnolence 07/23/2016  . Syncope and collapse 07/01/2016  . Concussion 07/01/2016  . Febrile illness, acute 07/01/2016  . Respiratory infection 05/20/2016  . Stress at home 02/08/2016  . Vitamin D deficiency 09/11/2015  . Routine general medical examination at a health care facility 09/11/2015  . Allergic rhinitis due to pollen 09/11/2015  . Premature atrial contractions   . Chronic low back pain 08/21/2014  . Nonallopathic lesion of lumbosacral region  08/21/2014  . Nonallopathic lesion of thoracic region 08/21/2014  . Nonallopathic lesion of sacral region 08/21/2014  . Osteoarthritis 08/02/2013  . Raynaud phenomenon 08/02/2013  . Hep C w/o coma, chronic ([place]) 05/24/2010    Past Medical History:  Diagnosis Date  . Allergy   . Anxiety   . Arthritis    R wrist and R knee  . CAP (community acquired pneumonia) 4/17-22/15   Rochephin & IV Zithromax & 3 days Levaquin  . Chronic back pain   . DVT (deep venous thrombosis) ([place])    a. in her 20s while on OCPs.  . Hepatitis C    contaminated blood @ her job, prior notes indicate spontaneously cleared  . History of tobacco abuse   . Osteoarthritis   . Palpitations   . Premature atrial contractions    a. Holter 11/2013: few PACs.  . Pulmonary function study abnormality    a. 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  . Raynaud phenomenon   . Ruptured lumbar disc    4 herniated disc   . Volume overload    a.  history of volume overload during hospital stay for PNA in 08/2013 felt iatrogenic due to receiving a lot of IV fluid. 2D echo 08/2013 showed EF 93-81%, normal diastolic function parameters.    Past Surgical History:  Procedure Laterality Date  . CHOLECYSTECTOMY  1985    Social History   Socioeconomic History  . Marital status: Married    Spouse name: Not on file  . Number of children: Not on file  . Years of education: Not on file  . Highest education level: Not on file  Occupational History  . Not on file  Social Needs  . Financial resource strain: Not on file  . Food insecurity:    Worry: Not on file    Inability: Not on file  . Transportation needs:    Medical: Not on file    Non-medical: Not on file  Tobacco Use  . Smoking status: Former Smoker    Packs/day: 0.25    Years: 25.00    Pack years: 6.25    Types: Cigarettes    Last attempt to quit: 08/19/2013    Years since quitting: 4.8  . Smokeless tobacco: Never Used  . Tobacco comment: smoked  1979-2008, up to 1 ppd. As of 08/01/13 1 cig every 2 weeks  Substance and Sexual Activity  . Alcohol use: No    Alcohol/week: 0.0 standard drinks  . Drug use: No    Comment: History-long time ago  . Sexual activity: Yes    Birth control/protection: None  Lifestyle  . Physical activity:    Days per week: Not on file    Minutes per session: Not on file  . Stress: Not on file  Relationships  . Social connections:    Talks on phone: Not on file    Gets together: Not on  file    Attends religious service: Not on file    Active member of club or organization: Not on file    Attends meetings of clubs or organizations: Not on file    Relationship status: Not on file  . Intimate partner violence:    Fear of current or ex partner: Not on file    Emotionally abused: Not on file    Physically abused: Not on file    Forced sexual activity: Not on file  Other Topics Concern  . Not on file  Social History Narrative  . Not on file    Family History  Problem Relation Age of Onset  . Diabetes Father   . Hypertension Father   . Heart attack Father        >55  . Lymphoma Father   . Heart disease Father   . Stroke Mother 13       after bedridden post fall with BLE paralysis  . Breast cancer Mother         X 3  . Breast cancer Sister   . Alzheimer's disease Maternal Grandmother   . Stroke Maternal Grandfather        in 66s  . Colon cancer Neg Hx      Review of Systems  Constitutional: Negative.  Negative for chills and fever.  HENT: Negative.   Eyes: Negative.   Respiratory: Negative.  Negative for cough and shortness of breath.   Cardiovascular: Negative.  Negative for chest pain and palpitations.  Gastrointestinal: Negative for abdominal pain, nausea and vomiting.  Genitourinary: Positive for dysuria, frequency and urgency. Negative for flank pain and hematuria.  Skin: Negative.  Negative for rash.  Neurological: Negative.  Negative for dizziness and headaches.    Endo/Heme/Allergies: Negative.   All other systems reviewed and are negative.   Vitals:   06/09/18 1143  BP: 105/69  Pulse: 92  Resp: 16  Temp: 99 F (37.2 C)  SpO2: 99%    Physical Exam Vitals signs reviewed.  Constitutional:      Appearance: Normal appearance.  HENT:     Head: Normocephalic and atraumatic.  Eyes:     Extraocular Movements: Extraocular movements intact.     Pupils: Pupils are equal, round, and reactive to light.  Neck:     Musculoskeletal: Normal range of motion and neck supple.  Cardiovascular:     Rate and Rhythm: Normal rate and regular rhythm.  Pulmonary:     Breath sounds: Normal breath sounds.  Abdominal:     General: There is no distension.     Palpations: Abdomen is soft.     Tenderness: There is no abdominal tenderness. There is no right CVA tenderness or left CVA tenderness.  Musculoskeletal: Normal range of motion.  Skin:    General: Skin is warm and dry.  Neurological:     General: No focal deficit present.     Mental Status: She is alert and oriented to person, place, and time.  Psychiatric:        Mood and Affect: Mood normal.        Behavior: Behavior normal.    Results for orders placed or performed in visit on 06/09/18 (from the past 24 hour(s))  POCT urinalysis dipstick     Status: Abnormal   Collection Time: 06/09/18 11:50 AM  Result Value Ref Range   Color, UA yellow yellow   Clarity, UA clear clear   Glucose, UA negative negative mg/dL   Bilirubin, UA negative  negative   Ketones, POC UA negative negative mg/dL   Spec Grav, UA 1.010 1.010 - 1.025   Blood, UA trace-lysed (A) negative   pH, UA 6.5 5.0 - 8.0   Protein Ur, POC negative negative mg/dL   Urobilinogen, UA 0.2 0.2 or 1.0 E.U./dL   Nitrite, UA Negative Negative   Leukocytes, UA Trace (A) Negative     ASSESSMENT & PLAN: Laresha was seen today for dysuria.  Diagnoses and all orders for this visit:  Acute UTI -     nitrofurantoin, macrocrystal-monohydrate,  (MACROBID) 100 MG capsule; Take 1 capsule (100 mg total) by mouth 2 (two) times daily for 7 days. -     fluconazole (DIFLUCAN) 150 MG tablet; Take 1 tablet (150 mg total) by mouth once for 1 dose.  Dysuria -     POCT urinalysis dipstick -     Urine Culture    Patient Instructions       If you have lab work done today you will be contacted with your lab results within the next 2 weeks.  If you have not heard from [name] then please contact us. The fastest way to get your results is to register for My Chart.   IF you received an x-ray today, you will receive an invoice from [facility]. Please contact [facility] at [phone] with questions or concerns regarding your invoice.   IF you received labwork today, you will receive an invoice from [facility]. Please contact [facility] at [phone] with questions or concerns regarding your invoice.   Our billing staff will not be able to assist you with questions regarding bills from these companies.  You will be contacted with the lab results as soon as they are available. The fastest way to get your results is to activate your My Chart account. Instructions are located on the last page of this paperwork. If you have not heard from [name] regarding the results in 2 weeks, please contact this office.      Urinary Tract Infection, Adult A urinary tract infection (UTI) is an infection of any part of the urinary tract. The urinary tract includes:  The kidneys.  The ureters.  The bladder.  The urethra. These organs make, store, and get rid of pee (urine) in the body. What are the causes? This is caused by germs (bacteria) in your genital area. These germs grow and cause swelling (inflammation) of your urinary tract. What increases the risk? You are more likely to develop this condition if:  You have a small, thin tube (catheter) to drain pee.  You cannot control when you pee or poop (incontinence).  You are female,  and: ? You use these methods to prevent pregnancy: ? A medicine that kills sperm (spermicide). ? A device that blocks sperm (diaphragm). ? You have low levels of a female hormone (estrogen). ? You are pregnant.  You have genes that add to your risk.  You are sexually active.  You take antibiotic medicines.  You have trouble peeing because of: ? A prostate that is bigger than normal, if you are female. ? A blockage in the part of your body that drains pee from the bladder (urethra). ? A kidney stone. ? A nerve condition that affects your bladder (neurogenic bladder). ? Not getting enough to drink. ? Not peeing often enough.  You have other conditions, such as: ? Diabetes. ? A weak disease-fighting system (immune system). ? Sickle cell disease. ? Gout. ? Injury of the spine. What  are the signs or symptoms? Symptoms of this condition include:  Needing to pee right away (urgently).  Peeing often.  Peeing small amounts often.  Pain or burning when peeing.  Blood in the pee.  Pee that smells bad or not like normal.  Trouble peeing.  Pee that is cloudy.  Fluid coming from the vagina, if you are female.  Pain in the belly or lower back. Other symptoms include:  Throwing up (vomiting).  No urge to eat.  Feeling mixed up (confused).  Being tired and grouchy (irritable).  A fever.  Watery poop (diarrhea). How is this treated? This condition may be treated with:  Antibiotic medicine.  Other medicines.  Drinking enough water. Follow these instructions at home:  Medicines  Take over-the-counter and prescription medicines only as told by your doctor.  If you were prescribed an antibiotic medicine, take it as told by your doctor. Do not stop taking it even if you start to feel better. General instructions  Make sure you: ? Pee until your bladder is empty. ? Do not hold pee for a long time. ? Empty your bladder after sex. ? Wipe from front to back  after pooping if you are a female. Use each tissue one time when you wipe.  Drink enough fluid to keep your pee pale yellow.  Keep all follow-up visits as told by your doctor. This is important. Contact a doctor if:  You do not get better after 1-2 days.  Your symptoms go away and then come back. Get help right away if:  You have very bad back pain.  You have very bad pain in your lower belly.  You have a fever.  You are sick to your stomach (nauseous).  You are throwing up. Summary  A urinary tract infection (UTI) is an infection of any part of the urinary tract.  This condition is caused by germs in your genital area.  There are many risk factors for a UTI. These include having a small, thin tube to drain pee and not being able to control when you pee or poop.  Treatment includes antibiotic medicines for germs.  Drink enough fluid to keep your pee pale yellow. This information is not intended to replace advice given to you by your health care provider. Make sure you discuss any questions you have with your health care provider. Document Released: 10/22/2007 Document Revised: 11/12/2017 Document Reviewed: 11/12/2017 Elsevier Interactive Patient Education  2019 [employer]      Agustina Caroli, MD [facility]

## 2018-06-09 NOTE — Patient Instructions (Addendum)
   If you have lab work done today you will be contacted with your lab results within the next 2 weeks.  If you have not heard from us then please contact us. The fastest way to get your results is to register for My Chart.   IF you received an x-ray today, you will receive an invoice from [facility]. Please contact [facility] at [phone] with questions or concerns regarding your invoice.   IF you received labwork today, you will receive an invoice from [facility]. Please contact [facility] at [phone] with questions or concerns regarding your invoice.   Our billing staff will not be able to assist you with questions regarding bills from these companies.  You will be contacted with the lab results as soon as they are available. The fastest way to get your results is to activate your My Chart account. Instructions are located on the last page of this paperwork. If you have not heard from us regarding the results in 2 weeks, please contact this office.     Urinary Tract Infection, Adult A urinary tract infection (UTI) is an infection of any part of the urinary tract. The urinary tract includes:  The kidneys.  The ureters.  The bladder.  The urethra. These organs make, store, and get rid of pee (urine) in the body. What are the causes? This is caused by germs (bacteria) in your genital area. These germs grow and cause swelling (inflammation) of your urinary tract. What increases the risk? You are more likely to develop this condition if:  You have a small, thin tube (catheter) to drain pee.  You cannot control when you pee or poop (incontinence).  You are female, and: ? You use these methods to prevent pregnancy: ? A medicine that kills sperm (spermicide). ? A device that blocks sperm (diaphragm). ? You have low levels of a female hormone (estrogen). ? You are pregnant.  You have genes that add to your risk.  You are sexually active.  You take  antibiotic medicines.  You have trouble peeing because of: ? A prostate that is bigger than normal, if you are female. ? A blockage in the part of your body that drains pee from the bladder (urethra). ? A kidney stone. ? A nerve condition that affects your bladder (neurogenic bladder). ? Not getting enough to drink. ? Not peeing often enough.  You have other conditions, such as: ? Diabetes. ? A weak disease-fighting system (immune system). ? Sickle cell disease. ? Gout. ? Injury of the spine. What are the signs or symptoms? Symptoms of this condition include:  Needing to pee right away (urgently).  Peeing often.  Peeing small amounts often.  Pain or burning when peeing.  Blood in the pee.  Pee that smells bad or not like normal.  Trouble peeing.  Pee that is cloudy.  Fluid coming from the vagina, if you are female.  Pain in the belly or lower back. Other symptoms include:  Throwing up (vomiting).  No urge to eat.  Feeling mixed up (confused).  Being tired and grouchy (irritable).  A fever.  Watery poop (diarrhea). How is this treated? This condition may be treated with:  Antibiotic medicine.  Other medicines.  Drinking enough water. Follow these instructions at home:  Medicines  Take over-the-counter and prescription medicines only as told by your doctor.  If you were prescribed an antibiotic medicine, take it as told by your doctor. Do not stop taking it even if   you start to feel better. General instructions  Make sure you: ? Pee until your bladder is empty. ? Do not hold pee for a long time. ? Empty your bladder after sex. ? Wipe from front to back after pooping if you are a female. Use each tissue one time when you wipe.  Drink enough fluid to keep your pee pale yellow.  Keep all follow-up visits as told by your doctor. This is important. Contact a doctor if:  You do not get better after 1-2 days.  Your symptoms go away and then come  back. Get help right away if:  You have very bad back pain.  You have very bad pain in your lower belly.  You have a fever.  You are sick to your stomach (nauseous).  You are throwing up. Summary  A urinary tract infection (UTI) is an infection of any part of the urinary tract.  This condition is caused by germs in your genital area.  There are many risk factors for a UTI. These include having a small, thin tube to drain pee and not being able to control when you pee or poop.  Treatment includes antibiotic medicines for germs.  Drink enough fluid to keep your pee pale yellow. This information is not intended to replace advice given to you by your health care provider. Make sure you discuss any questions you have with your health care provider. Document Released: 10/22/2007 Document Revised: 11/12/2017 Document Reviewed: 11/12/2017 Elsevier Interactive Patient Education  2019 Elsevier Inc.  

## 2018-06-11 ENCOUNTER — Encounter: Payer: Self-pay | Admitting: Emergency Medicine

## 2018-06-11 LAB — URINE CULTURE

## 2018-06-23 ENCOUNTER — Encounter: Payer: Self-pay | Admitting: Neurology

## 2018-06-24 ENCOUNTER — Other Ambulatory Visit: Payer: Self-pay | Admitting: Internal Medicine

## 2018-06-24 DIAGNOSIS — M25562 Pain in left knee: Principal | ICD-10-CM

## 2018-06-24 DIAGNOSIS — M25561 Pain in right knee: Secondary | ICD-10-CM

## 2018-06-24 MED ORDER — HYDROCODONE-ACETAMINOPHEN 5-325 MG PO TABS: 1.0000 | ORAL_TABLET | Freq: Four times a day (QID) | ORAL | 0 refills | Status: DC | PRN

## 2018-06-25 ENCOUNTER — Other Ambulatory Visit: Payer: Self-pay | Admitting: Internal Medicine

## 2018-06-25 NOTE — Telephone Encounter (Signed)
Copied from [place] [id number]. Topic: Quick Communication - Rx Refill/Question >> Jun 25, 2018  9:55 AM Scherrie Gerlach wrote: Medication: traMADol (ULTRAM) 50 MG tablet  Pt sent mychart message and Dr Alain Marion states he had done the Rx she requested. Pt states he did one, but not the other. She knows this was just an oversight. Pt requesting refill asap sent to [id number] [address], [place] [phone] (Phone) [phone] (Fax)

## 2018-07-08 ENCOUNTER — Ambulatory Visit: Payer: BLUE CROSS/BLUE SHIELD | Admitting: Internal Medicine

## 2018-07-08 ENCOUNTER — Encounter: Payer: Self-pay | Admitting: Internal Medicine

## 2018-07-08 ENCOUNTER — Other Ambulatory Visit (INDEPENDENT_AMBULATORY_CARE_PROVIDER_SITE_OTHER): Payer: BLUE CROSS/BLUE SHIELD

## 2018-07-08 VITALS — BP 124/76 | HR 89 | Temp 97.8°F | Ht 66.0 in | Wt 126.0 lb

## 2018-07-08 DIAGNOSIS — M5441 Lumbago with sciatica, right side: Secondary | ICD-10-CM | POA: Diagnosis not present

## 2018-07-08 DIAGNOSIS — R5382 Chronic fatigue, unspecified: Secondary | ICD-10-CM

## 2018-07-08 DIAGNOSIS — M5442 Lumbago with sciatica, left side: Secondary | ICD-10-CM | POA: Diagnosis not present

## 2018-07-08 DIAGNOSIS — G8929 Other chronic pain: Secondary | ICD-10-CM

## 2018-07-08 DIAGNOSIS — Z23 Encounter for immunization: Secondary | ICD-10-CM | POA: Diagnosis not present

## 2018-07-08 DIAGNOSIS — E559 Vitamin D deficiency, unspecified: Secondary | ICD-10-CM | POA: Diagnosis not present

## 2018-07-08 LAB — URINALYSIS, ROUTINE W REFLEX MICROSCOPIC
Bilirubin Urine: NEGATIVE
Ketones, ur: NEGATIVE
Leukocytes,Ua: NEGATIVE
Nitrite: NEGATIVE
Specific Gravity, Urine: >=1.03 — AB (ref 1.000–1.030)
Total Protein, Urine: NEGATIVE
Urine Glucose: NEGATIVE
Urobilinogen, UA: 0.2 (ref 0.0–1.0)
pH: 5.5 (ref 5.0–8.0)

## 2018-07-08 LAB — BASIC METABOLIC PANEL
BUN: 18 mg/dL (ref 6–23)
CO2: 30 mEq/L (ref 19–32)
CREATININE: 0.82 mg/dL (ref 0.40–1.20)
Calcium: 9.5 mg/dL (ref 8.4–10.5)
Chloride: 102 mEq/L (ref 96–112)
GFR: 70.61 mL/min (ref >60.00)
Glucose, Bld: 91 mg/dL (ref 70–99)
Potassium: 3.8 mEq/L (ref 3.5–5.1)
Sodium: 140 mEq/L (ref 135–145)

## 2018-07-08 LAB — VITAMIN B12: Vitamin B-12: 341 pg/mL (ref 211–911)

## 2018-07-08 LAB — CORTISOL: Cortisol, Plasma: 2.3 ug/dL

## 2018-07-08 NOTE — Assessment & Plan Note (Addendum)
CFS Episodic, profound - worse  ?etiology: r/o MS, MG etc  Labs Neurol appt - Dr Posey Pronto pending Treat LBP

## 2018-07-08 NOTE — Assessment & Plan Note (Signed)
Vit D 

## 2018-07-08 NOTE — Patient Instructions (Signed)
A weighted blanket

## 2018-07-08 NOTE — Assessment & Plan Note (Addendum)
Tramadol low dose  Potential benefits of a long term opioids use as well as potential risks (i.e. addiction risk, apnea etc) and complications (i.e. Somnolence, constipation and others) were explained to the patient and were aknowledged. Neurol appt - Dr Posey Pronto pending Treat LBP w/meds

## 2018-07-08 NOTE — Addendum Note (Signed)
Addended by: Karren Cobble on: 07/08/2018 03:04 PM   Modules accepted: Orders

## 2018-07-08 NOTE — Progress Notes (Signed)
Subjective:  Patient ID: Rebecca Marks, female    DOB: 1955/06/03  Age: 63 y.o. MRN: [id number]  CC: No chief complaint on file.   HPI Rida Keniston presents for fatigue x 6 mo, worse lately. It is random 1 day a week is bad - "dragging" by 10 o'clock C/o LBP w/irrad to legs 6/10 - Norco or Tramadol C/o tingling B LEs  Outpatient Medications Prior to Visit  Medication Sig Dispense Refill  . Ascorbic Acid (VITAMIN C) 1000 MG tablet Take 2,000 mg by mouth 2 (two) times daily.     . Cholecalciferol (VITAMIN D3) 2000 UNITS TABS Take 2 tablets by mouth daily.    . Coenzyme Q10 200 MG capsule Take 200 mg by mouth daily.    [name] CORAL CALCIUM PO Take by mouth. 1000mg -3000mg  daily.    . cyclobenzaprine (FLEXERIL) 10 MG tablet Take 1 tablet (10 mg total) by mouth at bedtime. As needed 30 tablet 5  . doxepin (SINEQUAN) 10 MG capsule TAKE 1 CAPSULE BY MOUTH EVERY DAY AT BEDTIME AS NEEDED 90 capsule 1  . gabapentin (NEURONTIN) 300 MG capsule TAKE ONE CAPSULE BY MOUTH NIGHTLY 90 capsule 1  . HYDROcodone-acetaminophen (NORCO/VICODIN) 5-325 MG tablet Take 1 tablet by mouth every 6 (six) hours as needed for severe pain. 20 tablet 0  . ibuprofen (ADVIL,MOTRIN) 800 MG tablet Take 800 mg by mouth at bedtime as needed.    . Omega-3 Fatty Acids (ULTRA OMEGA 3 PO) Take by mouth. 1-2 grams daily.    [name] PHOSPHATIDYLSERINE PO Take 300 mg by mouth daily.     . traMADol (ULTRAM) 50 MG tablet TAKE 1 TABLET BY MOUTH TWICE A DAY AS NEEDED FOR PAIN 60 tablet 3  . venlafaxine XR (EFFEXOR-XR) 37.5 MG 24 hr capsule Take 1 capsule (37.5 mg total) by mouth daily with breakfast. 90 capsule 1  . VOLTAREN 1 % GEL Apply 1 application topically at bedtime. Applied to knee, hand, fingers.    . zaleplon (SONATA) 10 MG capsule Take 1 capsule (10 mg total) by mouth at bedtime as needed for sleep. 90 capsule 1  . Biotin 5000 MCG CAPS Take 1 capsule by mouth daily.     No facility-administered medications prior to visit.      ROS: Review of Systems  Constitutional: Positive for fatigue. Negative for activity change, appetite change, chills and unexpected weight change.  HENT: Negative for congestion, mouth sores and sinus pressure.   Eyes: Negative for visual disturbance.  Respiratory: Negative for cough and chest tightness.   Gastrointestinal: Negative for abdominal pain and nausea.  Genitourinary: Negative for difficulty urinating, frequency and vaginal pain.  Musculoskeletal: Positive for back pain. Negative for gait problem.  Skin: Negative for pallor and rash.  Neurological: Positive for weakness. Negative for dizziness, tremors, numbness and headaches.  Psychiatric/Behavioral: Negative for confusion and sleep disturbance.    Objective:  BP 124/76 (BP Location: Left Arm, Patient Position: Sitting, Cuff Size: Normal)   Pulse 89   Temp 97.8 F (36.6 C) (Oral)   Ht 5\' 6"  (1.676 m)   Wt 126 lb (57.2 kg)   SpO2 97%   BMI 20.34 kg/m   BP Readings from Last 3 Encounters:  07/08/18 124/76  06/09/18 105/69  04/21/18 114/74    Wt Readings from Last 3 Encounters:  07/08/18 126 lb (57.2 kg)  06/09/18 126 lb (57.2 kg)  04/21/18 123 lb (55.8 kg)    Physical Exam Constitutional:      General: She is  not in acute distress.    Appearance: She is well-developed.  HENT:     Head: Normocephalic.     Right Ear: External ear normal.     Left Ear: External ear normal.     Nose: Nose normal.  Eyes:     General:        Right eye: No discharge.        Left eye: No discharge.     Conjunctiva/sclera: Conjunctivae normal.     Pupils: Pupils are equal, round, and reactive to light.  Neck:     Musculoskeletal: Normal range of motion and neck supple.     Thyroid: No thyromegaly.     Vascular: No JVD.     Trachea: No tracheal deviation.  Cardiovascular:     Rate and Rhythm: Normal rate and regular rhythm.     Heart sounds: Normal heart sounds.  Pulmonary:     Effort: No respiratory distress.      Breath sounds: No stridor. No wheezing.  Abdominal:     General: Bowel sounds are normal. There is no distension.     Palpations: Abdomen is soft. There is no mass.     Tenderness: There is no abdominal tenderness. There is no guarding or rebound.  Musculoskeletal:        General: Tenderness present.  Lymphadenopathy:     Cervical: No cervical adenopathy.  Skin:    Findings: No erythema or rash.  Neurological:     Cranial Nerves: No cranial nerve deficit.     Motor: No abnormal muscle tone.     Coordination: Coordination normal.     Deep Tendon Reflexes: Reflexes normal.  Psychiatric:        Behavior: Behavior normal.        Thought Content: Thought content normal.        Judgment: Judgment normal.   LS tender w/ROM  Lab Results  Component Value Date   WBC 9.1 04/21/2018   HGB 15.0 04/21/2018   HCT 42.1 04/21/2018   PLT 286.0 04/21/2018   GLUCOSE 104 (H) 04/21/2018   CHOL 164 04/21/2018   TRIG 129.0 04/21/2018   HDL 48.70 04/21/2018   LDLCALC 90 04/21/2018   ALT 7 04/21/2018   AST 17 04/21/2018   NA 138 04/21/2018   K 4.2 04/21/2018   CL 103 04/21/2018   CREATININE 0.76 04/21/2018   BUN 22 04/21/2018   CO2 26 04/21/2018   TSH 2.04 04/21/2018   INR 1.0 09/12/2015   HGBA1C 4.4 (L) 07/30/2006    Dg Lumbar Spine Complete W/bend  Result Date: 08/27/2017 CLINICAL DATA:  Chronic back pain.  No known injury. EXAM: LUMBAR SPINE - COMPLETE WITH BENDING VIEWS COMPARISON:  No recent. FINDINGS: No acute bony abnormality identified. No evidence of fracture. Mild diffuse degenerative change. 10 mm anterolisthesis L4 on L5. IMPRESSION: No acute abnormality. Mild diffuse degenerative change. 10 mm anterolisthesis L4 on L5. Electronically Signed   By: Marcello Moores  Register   On: 08/27/2017 06:33    Assessment & Plan:   There are no diagnoses linked to this encounter.   No orders of the defined types were placed in this encounter.    Follow-up: No follow-ups on file.  Walker Kehr, MD

## 2018-07-10 ENCOUNTER — Other Ambulatory Visit: Payer: Self-pay | Admitting: Internal Medicine

## 2018-07-10 DIAGNOSIS — R5382 Chronic fatigue, unspecified: Secondary | ICD-10-CM

## 2018-07-13 DIAGNOSIS — F4323 Adjustment disorder with mixed anxiety and depressed mood: Secondary | ICD-10-CM | POA: Diagnosis not present

## 2018-07-13 LAB — STRIATED MUSCLE ANTIBODY: STRIATED MUSCLE AB SCREEN: NEGATIVE

## 2018-07-15 NOTE — Progress Notes (Signed)
[facility] Clinic Note - Initial Visit   Date: 07/16/18  Rebecca Marks MRN: [id number] DOB: November 16, 1955   Dear Dr. Alain Marion:  Thank you for your kind referral of Rebecca Marks for consultation of bilateral leg pain and paresthesias. Although her history is well known to you, please allow [name] to reiterate it for the purpose of our medical record. The patient was accompanied to the clinic by husband, Nicole Kindred, who also provides collateral information.     History of Present Illness: Rebecca Marks is a 63 y.o. right-handed Caucasian female presenting for evaluation of bilateral leg numbness and tingling.  She is a retired Marine scientist.   Starting in 1993, she ruptured three lumbar discs and was recommended surgery, but choose to treat this conservatively. She was told she has lumbar spinal stenosis and it would likely cause problems later in life.  She was doing relatively well until late 2018.  In early 2019, she developed numbness over the three middle toes on both feet. She also has sharp pain which starts in the back and radiates into her legs, pain occurs daily and lasts about 12 hours.  It is worse with prolonged sitting, standing, and walking. She has started drinking beer nightly, which provides some relief.  She saw her PCP who gave vicodin and tramdol, which can provide some relief.  Very rarely she has tried marijuana which eases her pain quickly.   Out-side paper records, electronic medical record, and images have been reviewed where available and summarized as:  Lab Results  Component Value Date   HGBA1C 4.4 (L) 07/30/2006   Lab Results  Component Value Date   [id number] 701 07/08/2018   Lab Results  Component Value Date   TSH 2.04 04/21/2018   Lab Results  Component Value Date   ESRSEDRATE 7 11/16/2014    Past Medical History:  Diagnosis Date  . Allergy   . Anxiety   . Arthritis    R wrist and R knee  . CAP (community acquired  pneumonia) 4/17-22/15   Rochephin & IV Zithromax & 3 days Levaquin  . Chronic back pain   . DVT (deep venous thrombosis) ([place])    a. in her 20s while on OCPs.  . Hepatitis C    contaminated blood @ her job, prior notes indicate spontaneously cleared  . History of tobacco abuse   . Osteoarthritis   . Palpitations   . Premature atrial contractions    a. Holter 11/2013: few PACs.  . Pulmonary function study abnormality    a. 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  . Raynaud phenomenon   . Ruptured lumbar disc    4 herniated disc   . Volume overload    a.  history of volume overload during hospital stay for PNA in 08/2013 felt iatrogenic due to receiving a lot of IV fluid. 2D echo 08/2013 showed EF 77-93%, normal diastolic function parameters.    Past Surgical History:  Procedure Laterality Date  . CHOLECYSTECTOMY  1985     Medications:  Outpatient Encounter Medications as of 07/16/2018  Medication Sig  . Ascorbic Acid (VITAMIN C) 1000 MG tablet Take 2,000 mg by mouth 2 (two) times daily.   . Cholecalciferol (VITAMIN D3) 2000 UNITS TABS Take 2 tablets by mouth daily.  . Coenzyme Q10 200 MG capsule Take 200 mg by mouth daily.  [name] CORAL CALCIUM PO Take by mouth. 1000mg -3000mg  daily.  . cyclobenzaprine (FLEXERIL) 10 MG tablet Take 1 tablet (10 mg total) by  mouth at bedtime. As needed  . doxepin (SINEQUAN) 10 MG capsule TAKE 1 CAPSULE BY MOUTH EVERY DAY AT BEDTIME AS NEEDED  . Folic Acid-Vit R4-YHC W23 0.4-50-0.1 MG TABS Take by mouth.  . gabapentin (NEURONTIN) 300 MG capsule Take 1 capsule (300 mg total) by mouth 3 (three) times daily.  [name] HYDROcodone-acetaminophen (NORCO/VICODIN) 5-325 MG tablet Take 1 tablet by mouth every 6 (six) hours as needed for severe pain.  [name] ibuprofen (ADVIL,MOTRIN) 800 MG tablet Take 800 mg by mouth at bedtime as needed.  . Nutritional Supplements (JOINT FORMULA PO) Take by mouth.  . Omega-3 Fatty Acids (ULTRA OMEGA 3 PO) Take by mouth. 1-2 grams  daily.  [name] PHOSPHATIDYLSERINE PO Take 300 mg by mouth daily.   . Riboflavin (B2 PO) Take 400 mg by mouth daily.  [name] selenium 50 MCG TABS tablet Take 200 mcg by mouth daily.  . traMADol (ULTRAM) 50 MG tablet TAKE 1 TABLET BY MOUTH TWICE A DAY AS NEEDED FOR PAIN  . VOLTAREN 1 % GEL Apply 1 application topically at bedtime. Applied to knee, hand, fingers.  . zaleplon (SONATA) 10 MG capsule Take 1 capsule (10 mg total) by mouth at bedtime as needed for sleep.  . [DISCONTINUED] gabapentin (NEURONTIN) 300 MG capsule TAKE ONE CAPSULE BY MOUTH NIGHTLY  . [DISCONTINUED] venlafaxine XR (EFFEXOR-XR) 37.5 MG 24 hr capsule Take 1 capsule (37.5 mg total) by mouth daily with breakfast.   No facility-administered encounter medications on file as of 07/16/2018.     Allergies:  Allergies  Allergen Reactions  . Ritalin [Methylphenidate Hcl]     Too hyper    Family History: Family History  Problem Relation Age of Onset  . Diabetes Father   . Hypertension Father   . Heart attack Father        >55  . Lymphoma Father   . Heart disease Father   . Stroke Mother 45       after bedridden post fall with BLE paralysis  . Breast cancer Mother         X 3  . Breast cancer Sister   . Alzheimer's disease Maternal Grandmother   . Stroke Maternal Grandfather        in 84s  . Colon cancer Neg Hx     Social History: Social History   Tobacco Use  . Smoking status: Former Smoker    Packs/day: 0.25    Years: 25.00    Pack years: 6.25    Types: Cigarettes    Last attempt to quit: 08/19/2013    Years since quitting: 4.9  . Smokeless tobacco: Never Used  . Tobacco comment: smoked 1979-2008, up to 1 ppd. As of 08/01/13 1 cig every 2 weeks  Substance Use Topics  . Alcohol use: Yes    Alcohol/week: 0.0 standard drinks    Comment: 1 Beer nightly  . Drug use: No    Comment: History-long time ago   Social History   Social History Narrative   She lives with husband. She does not have children.   She is a  retired Marine scientist.    She lives in 3 story home.     Review of Systems:  CONSTITUTIONAL: No fevers, chills, night sweats, or weight loss.   EYES: No visual changes or eye pain ENT: No hearing changes.  No history of nose bleeds.   RESPIRATORY: No cough, wheezing and shortness of breath.   CARDIOVASCULAR: Negative for chest pain, and palpitations.   GI: Negative for  abdominal discomfort, blood in stools or black stools.  No recent change in bowel habits.   GU:  No history of incontinence.   MUSCLOSKELETAL: +history of joint pain or swelling.  +myalgias.   SKIN: Negative for lesions, rash, and itching.   HEMATOLOGY/ONCOLOGY: Negative for prolonged bleeding, bruising easily, and swollen nodes.  No history of cancer.   ENDOCRINE: Negative for cold or heat intolerance, polydipsia or goiter.   PSYCH:  No depression or anxiety symptoms.   NEURO: As Above.   Vital Signs:  BP 104/66   Pulse 94   Ht 5' 6.25" (1.683 m)   Wt 128 lb (58.1 kg)   SpO2 96%   BMI 20.50 kg/m    General Medical Exam:   General:  Well appearing, comfortable.   Eyes/ENT: see cranial nerve examination.   Neck:   No carotid bruits. Respiratory:  Clear to auscultation, good air entry bilaterally.   Cardiac:  Regular rate and rhythm, no murmur.   Extremities:  No deformities, edema, or skin discoloration.  Skin:  No rashes or lesions.  Neurological Exam: MENTAL STATUS including orientation to time, place, person, recent and remote memory, attention span and concentration, language, and fund of knowledge is normal.  Speech is not dysarthric.  CRANIAL NERVES: II:  No visual field defects.  Unremarkable fundi.   III-IV-VI: Pupils equal round and reactive to light.  Normal conjugate, extra-ocular eye movements in all directions of gaze.  No nystagmus.  No ptosis.   V:  Normal facial sensation.    VII:  Normal facial symmetry and movements.   VIII:  Normal hearing and vestibular function.   IX-X:  Normal palatal  movement.   XI:  Normal shoulder shrug and head rotation.   XII:  Normal tongue strength and range of motion, no deviation or fasciculation.  MOTOR:  No atrophy, fasciculations or abnormal movements.  No pronator drift.   Right Upper Extremity:    Left Upper Extremity:    Deltoid  5/5   Deltoid  5/5   Biceps  5/5   Biceps  5/5   Triceps  5/5   Triceps  5/5   Wrist extensors  5/5   Wrist extensors  5/5   Wrist flexors  5/5   Wrist flexors  5/5   Finger extensors  5/5   Finger extensors  5/5   Finger flexors  5/5   Finger flexors  5/5   Dorsal interossei  5/5   Dorsal interossei  5/5   Abductor pollicis  5/5   Abductor pollicis  5/5   Tone (Ashworth scale)  0  Tone (Ashworth scale)  0   Right Lower Extremity:    Left Lower Extremity:    Hip flexors  5/5   Hip flexors  5/5   Hip extensors  5/5   Hip extensors  5/5   Knee flexors  5/5   Knee flexors  5/5   Knee extensors  5/5   Knee extensors  5/5   Dorsiflexors  5/5   Dorsiflexors  5/5   Plantarflexors  5/5   Plantarflexors  5/5   Toe extensors  5/5   Toe extensors  5/5   Toe flexors  5/5   Toe flexors  5/5   Tone (Ashworth scale)  0  Tone (Ashworth scale)  0   MSRs:  Right  Left brachioradialis 2+  brachioradialis 2+  biceps 2+  biceps 2+  triceps 2+  triceps 2+  patellar 3+  patellar 3+  ankle jerk 2+  ankle jerk 2+  Hoffman no  Hoffman no  plantar response down  plantar response down   SENSORY:  Normal and symmetric perception of light touch, pinprick, vibration, and proprioception.  Romberg's sign absent.   COORDINATION/GAIT: Normal finger-to- nose-finger.  Intact rapid alternating movements bilaterally. Gait narrow based and stable. Tandem and stressed gait intact.    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    - MRI lumbar spine without contrast to assess structural pathology  -  Start physical therapy for low back stretching and strengthening  - Increase gabapentin to 300mg  three times daily  - Continue flexeril 10mg  at bedtime  - Pending MRI results, consider epidural steroid injection if no improvement   Thank you for allowing me to participate in patient's care.  If I can answer any additional questions, I would be pleased to do so.    Sincerely,    Ife Vitelli K. Posey Pronto, DO

## 2018-07-16 ENCOUNTER — Ambulatory Visit: Payer: BLUE CROSS/BLUE SHIELD | Admitting: Neurology

## 2018-07-16 ENCOUNTER — Encounter: Payer: Self-pay | Admitting: Neurology

## 2018-07-16 VITALS — BP 104/66 | HR 94 | Ht 66.25 in | Wt 128.0 lb

## 2018-07-16 DIAGNOSIS — R292 Abnormal reflex: Secondary | ICD-10-CM | POA: Diagnosis not present

## 2018-07-16 DIAGNOSIS — M48062 Spinal stenosis, lumbar region with neurogenic claudication: Secondary | ICD-10-CM | POA: Diagnosis not present

## 2018-07-16 MED ORDER — GABAPENTIN 300 MG PO CAPS: 300.0000 mg | ORAL_CAPSULE | Freq: Three times a day (TID) | ORAL | 3 refills | Status: DC

## 2018-07-16 NOTE — Patient Instructions (Addendum)
MRI lumbar spine without contrast  Start physical therapy low back strengthen and stretching  Increase gabapentin to 300mg  twice daily x 3 days, then increase to 300mg  three times daily.  If you get sleepy, you can take 600mg  at bedtime instead.  We will call you with the results and let you know the next step  We have sent a referral to [facility] for your MRI and they will call you directly to schedule your appt. They are located at [place]. If you need to contact them directly please call [phone].

## 2018-07-20 DIAGNOSIS — F4323 Adjustment disorder with mixed anxiety and depressed mood: Secondary | ICD-10-CM | POA: Diagnosis not present

## 2018-07-22 MED ORDER — GABAPENTIN 100 MG PO CAPS: 200.0000 mg | ORAL_CAPSULE | Freq: Two times a day (BID) | ORAL | 3 refills | Status: DC

## 2018-07-29 ENCOUNTER — Other Ambulatory Visit: Payer: Self-pay

## 2018-07-29 ENCOUNTER — Ambulatory Visit
Admission: RE | Admit: 2018-07-29 | Discharge: 2018-07-29 | Disposition: A | Discharge status: Home / Self Care | Payer: BLUE CROSS/BLUE SHIELD | Source: Ambulatory Visit | Attending: Neurology | Admitting: Neurology

## 2018-07-29 DIAGNOSIS — M48062 Spinal stenosis, lumbar region with neurogenic claudication: Secondary | ICD-10-CM

## 2018-07-29 DIAGNOSIS — M48061 Spinal stenosis, lumbar region without neurogenic claudication: Secondary | ICD-10-CM | POA: Diagnosis not present

## 2018-07-29 DIAGNOSIS — R292 Abnormal reflex: Secondary | ICD-10-CM

## 2018-08-02 ENCOUNTER — Telehealth: Payer: Self-pay | Admitting: *Deleted

## 2018-08-02 ENCOUNTER — Encounter: Payer: Self-pay | Admitting: *Deleted

## 2018-08-02 DIAGNOSIS — M6281 Muscle weakness (generalized): Secondary | ICD-10-CM | POA: Diagnosis not present

## 2018-08-02 DIAGNOSIS — M545 Low back pain: Secondary | ICD-10-CM | POA: Diagnosis not present

## 2018-08-02 DIAGNOSIS — G894 Chronic pain syndrome: Secondary | ICD-10-CM | POA: Diagnosis not present

## 2018-08-02 NOTE — Telephone Encounter (Signed)
Results and instructions sent via My Chart.  

## 2018-08-02 NOTE — Telephone Encounter (Signed)
-----   Message from Alda Berthold, DO sent at 08/02/2018 10:03 AM EDT ----- Please inform patient that her MRI shows spinal stenosis at L4-5 (moderate) which is most likely causing her symptoms.  If there is no benefit with physical therapy, then we can refer her to [facility] for injections to her back.  Thanks.

## 2018-08-06 DIAGNOSIS — M6281 Muscle weakness (generalized): Secondary | ICD-10-CM | POA: Diagnosis not present

## 2018-08-06 DIAGNOSIS — G894 Chronic pain syndrome: Secondary | ICD-10-CM | POA: Diagnosis not present

## 2018-08-06 DIAGNOSIS — M545 Low back pain: Secondary | ICD-10-CM | POA: Diagnosis not present

## 2018-08-12 DIAGNOSIS — M545 Low back pain: Secondary | ICD-10-CM | POA: Diagnosis not present

## 2018-08-12 DIAGNOSIS — M6281 Muscle weakness (generalized): Secondary | ICD-10-CM | POA: Diagnosis not present

## 2018-08-12 DIAGNOSIS — G894 Chronic pain syndrome: Secondary | ICD-10-CM | POA: Diagnosis not present

## 2018-08-14 ENCOUNTER — Other Ambulatory Visit: Payer: Self-pay | Admitting: Neurology

## 2018-08-19 DIAGNOSIS — M6281 Muscle weakness (generalized): Secondary | ICD-10-CM | POA: Diagnosis not present

## 2018-08-19 DIAGNOSIS — M545 Low back pain: Secondary | ICD-10-CM | POA: Diagnosis not present

## 2018-08-19 DIAGNOSIS — G894 Chronic pain syndrome: Secondary | ICD-10-CM | POA: Diagnosis not present

## 2018-08-26 DIAGNOSIS — M6281 Muscle weakness (generalized): Secondary | ICD-10-CM | POA: Diagnosis not present

## 2018-08-26 DIAGNOSIS — G894 Chronic pain syndrome: Secondary | ICD-10-CM | POA: Diagnosis not present

## 2018-08-26 DIAGNOSIS — M545 Low back pain: Secondary | ICD-10-CM | POA: Diagnosis not present

## 2018-09-01 ENCOUNTER — Ambulatory Visit (INDEPENDENT_AMBULATORY_CARE_PROVIDER_SITE_OTHER): Payer: BLUE CROSS/BLUE SHIELD | Admitting: Internal Medicine

## 2018-09-01 ENCOUNTER — Encounter: Payer: Self-pay | Admitting: Internal Medicine

## 2018-09-01 DIAGNOSIS — M25561 Pain in right knee: Secondary | ICD-10-CM | POA: Diagnosis not present

## 2018-09-01 DIAGNOSIS — G8929 Other chronic pain: Secondary | ICD-10-CM

## 2018-09-01 DIAGNOSIS — M5442 Lumbago with sciatica, left side: Secondary | ICD-10-CM

## 2018-09-01 DIAGNOSIS — M25562 Pain in left knee: Secondary | ICD-10-CM | POA: Diagnosis not present

## 2018-09-01 DIAGNOSIS — M5441 Lumbago with sciatica, right side: Secondary | ICD-10-CM | POA: Diagnosis not present

## 2018-09-01 MED ORDER — HYDROCODONE-ACETAMINOPHEN 5-325 MG PO TABS: 0.5000 | ORAL_TABLET | Freq: Four times a day (QID) | ORAL | 0 refills | Status: DC | PRN

## 2018-09-01 NOTE — Assessment & Plan Note (Addendum)
Tramadol low dose  Potential benefits of a long term opioids use as well as potential risks (i.e. addiction risk, apnea etc) and complications (i.e. Somnolence, constipation and others) were explained to the patient and were aknowledged.  Very rarely Rebecca Marks needs to use half tablet of Norco as needed if tramadol does not help. 2020 MRI I will renew Norco

## 2018-09-01 NOTE — Telephone Encounter (Signed)
Notified pt MD would like for her to do a VOV. Made appt for this afternoon at 3:20.Marland KitchenJohny Marks

## 2018-09-01 NOTE — Progress Notes (Signed)
Virtual Visit via Telephone Note  I connected with Rebecca Marks on 09/01/18 at  3:20 PM EDT by telephone and verified that I am speaking with the correct person using two identifiers.   I discussed the limitations, risks, security and privacy concerns of performing an evaluation and management service by telephone and the availability of in person appointments. I also discussed with the patient that there may be a patient responsible charge related to this service. The patient expressed understanding and agreed to proceed.   History of Present Illness:  Rebecca Marks needs to follow-up on her chronic low back pain.  Most the time she will use tramadol.  On occasion, very rarely, Rebecca Marks needs to use hydrocodone half a tablet as needed.  She is very careful about. Observations/Objective: She is in no acute distress  Assessment and Plan:  See plan Follow Up Instructions:    I discussed the assessment and treatment plan with the patient. The patient was provided an opportunity to ask questions and all were answered. The patient agreed with the plan and demonstrated an understanding of the instructions.   The patient was advised to call back or seek an in-person evaluation if the symptoms worsen or if the condition fails to improve as anticipated.  I provided 15 minutes of non-face-to-face time during this encounter.   Walker Kehr, MD

## 2018-09-02 DIAGNOSIS — M6281 Muscle weakness (generalized): Secondary | ICD-10-CM | POA: Diagnosis not present

## 2018-09-02 DIAGNOSIS — G894 Chronic pain syndrome: Secondary | ICD-10-CM | POA: Diagnosis not present

## 2018-09-02 DIAGNOSIS — M545 Low back pain: Secondary | ICD-10-CM | POA: Diagnosis not present

## 2018-09-09 DIAGNOSIS — M545 Low back pain: Secondary | ICD-10-CM | POA: Diagnosis not present

## 2018-09-09 DIAGNOSIS — M6281 Muscle weakness (generalized): Secondary | ICD-10-CM | POA: Diagnosis not present

## 2018-09-09 DIAGNOSIS — G894 Chronic pain syndrome: Secondary | ICD-10-CM | POA: Diagnosis not present

## 2018-09-16 DIAGNOSIS — M6281 Muscle weakness (generalized): Secondary | ICD-10-CM | POA: Diagnosis not present

## 2018-09-16 DIAGNOSIS — M545 Low back pain: Secondary | ICD-10-CM | POA: Diagnosis not present

## 2018-09-16 DIAGNOSIS — G894 Chronic pain syndrome: Secondary | ICD-10-CM | POA: Diagnosis not present

## 2018-09-23 DIAGNOSIS — M545 Low back pain: Secondary | ICD-10-CM | POA: Diagnosis not present

## 2018-09-23 DIAGNOSIS — G894 Chronic pain syndrome: Secondary | ICD-10-CM | POA: Diagnosis not present

## 2018-09-23 DIAGNOSIS — M6281 Muscle weakness (generalized): Secondary | ICD-10-CM | POA: Diagnosis not present

## 2018-09-29 DIAGNOSIS — G894 Chronic pain syndrome: Secondary | ICD-10-CM | POA: Diagnosis not present

## 2018-09-29 DIAGNOSIS — M6281 Muscle weakness (generalized): Secondary | ICD-10-CM | POA: Diagnosis not present

## 2018-09-29 DIAGNOSIS — M545 Low back pain: Secondary | ICD-10-CM | POA: Diagnosis not present

## 2018-10-02 ENCOUNTER — Other Ambulatory Visit: Payer: Self-pay | Admitting: Internal Medicine

## 2018-10-05 DIAGNOSIS — G894 Chronic pain syndrome: Secondary | ICD-10-CM | POA: Diagnosis not present

## 2018-10-05 DIAGNOSIS — M6281 Muscle weakness (generalized): Secondary | ICD-10-CM | POA: Diagnosis not present

## 2018-10-05 DIAGNOSIS — M545 Low back pain: Secondary | ICD-10-CM | POA: Diagnosis not present

## 2018-10-06 ENCOUNTER — Telehealth: Payer: Self-pay | Admitting: Neurology

## 2018-10-06 NOTE — Telephone Encounter (Signed)
Patient is wanting a VV if possible- she has had back problems for years but her legs are bothering her more now than anything. Patient is wanting to talk about this. She is doing physical therapy but is wanting to look at other options to help. Can we schedule her for a VV to talk about this? Thanks!

## 2018-10-07 NOTE — Telephone Encounter (Signed)
We are scheduling a virtual visit. Pt did not have injections. She said she was instructed to have PT for back strengthening first. She has been doing this once weekly at [facility] on [address].

## 2018-10-07 NOTE — Telephone Encounter (Signed)
OK for VV to discuss options.  I had referred her to [facility] for injections - please find out if she saw them.

## 2018-10-07 NOTE — Telephone Encounter (Signed)
From pts knees down has a sensation that she has scratched and clawed her skin. States that she has been dehydrated but has increased her fluids and has noticed some improvement. Pt questions claudication problems. She denies swelling, reddness. She does c/o of pain from knees down. Taking 800mg  of IBU at night. States that it calms down and she is able to sleep but sensation returns.  Exercise does not make better or worse. Would like relief asap.

## 2018-10-07 NOTE — Telephone Encounter (Signed)
Noted  

## 2018-10-12 ENCOUNTER — Telehealth: Payer: Self-pay | Admitting: Neurology

## 2018-10-12 NOTE — Telephone Encounter (Signed)
Patient left VM that she was returning a call to Allison to go over info before appt. She said she wont be available until after 3 today. She is at work. Thanks!

## 2018-10-13 ENCOUNTER — Telehealth: Payer: Self-pay | Admitting: Neurology

## 2018-10-13 ENCOUNTER — Telehealth (INDEPENDENT_AMBULATORY_CARE_PROVIDER_SITE_OTHER): Payer: BLUE CROSS/BLUE SHIELD | Admitting: Neurology

## 2018-10-13 ENCOUNTER — Other Ambulatory Visit: Payer: Self-pay

## 2018-10-13 ENCOUNTER — Encounter: Payer: Self-pay | Admitting: Neurology

## 2018-10-13 VITALS — Ht 66.75 in | Wt 126.3 lb

## 2018-10-13 DIAGNOSIS — M79604 Pain in right leg: Secondary | ICD-10-CM | POA: Diagnosis not present

## 2018-10-13 DIAGNOSIS — M79605 Pain in left leg: Secondary | ICD-10-CM

## 2018-10-13 DIAGNOSIS — M48062 Spinal stenosis, lumbar region with neurogenic claudication: Secondary | ICD-10-CM

## 2018-10-13 DIAGNOSIS — G894 Chronic pain syndrome: Secondary | ICD-10-CM | POA: Diagnosis not present

## 2018-10-13 DIAGNOSIS — M545 Low back pain: Secondary | ICD-10-CM | POA: Diagnosis not present

## 2018-10-13 DIAGNOSIS — M6281 Muscle weakness (generalized): Secondary | ICD-10-CM | POA: Diagnosis not present

## 2018-10-13 NOTE — Telephone Encounter (Signed)
Patient left vm wanting to speak with the nurse. Please call her. Thanks!

## 2018-10-13 NOTE — Progress Notes (Signed)
Virtual Visit via Video Note The purpose of this virtual visit is to provide medical care while limiting exposure to the novel coronavirus.    Consent was obtained for video visit:  Yes.   Answered questions that patient had about telehealth interaction:  Yes.   I discussed the limitations, risks, security and privacy concerns of performing an evaluation and management service by telemedicine. I also discussed with the patient that there may be a patient responsible charge related to this service. The patient expressed understanding and agreed to proceed.  Pt location: Home Physician Location: office Name of referring provider:  Plotnikov, Evie Lacks, MD I connected with Rebecca Marks at patients initiation/request on 10/13/2018 at 10:00 AM EDT by video enabled telemedicine application and verified that I am speaking with the correct person using two identifiers. Pt MRN:  [id number] Pt DOB:  December 26, 1955 Video Participants:  Rebecca Marks   History of Present Illness: This is a 63 y.o. female requesting an acute visit for episode of severe bilateral leg pain.  This episode occurred last week and lasted 1 day, characterized by severe scratching pain below the knees and above the ankle involving the entire lower leg.  Pain did not radiate from her back or involve her feet.  There was no associated numbness, tingling, or weakness.  There was no discoloration, rash, swelling.  No specific trigger.  Pain did not worsen or improve with physical activity, such as walking.  She reports having this type of pain many times before and has seen a rheumatologist in the past, Dr. Charlestine Night.  His evaluation was negative and he offered Xanax and Flexeril, both which provided some relief.  Upon further questioning, it appears that she had significant stress and anxiety, which was contributing to her pain.  She is no longer on Xanax.  She takes Flexeril at bedtime.  Regarding the numbness of her toes, this has  significantly improved with physical therapy.  Symptoms were nearly resolved until last week, when she started noticing the numbness returned.  She continues to have low-grade dull achy low back pain, which is slightly better than before due to PT.  She adjusted her gabapentin to 100mg  at 10a, 2pm, and 6pm, and 300mg  at bedtime, which helps.  Observations/Objective:   Vitals:   10/13/18 0852  Weight: 126 lb 5 oz (57.3 kg)  Height: 5' 6.75" (1.695 m)   Patient is awake, alert, and appears comfortable.  Oriented x 4.   Extraocular muscles are intact. No ptosis.  Face is symmetric.  Speech is not dysarthric   Data: MRI lumbar spine 07/29/2018:  5 mm anterolisthesis L4-5 with severe facet degeneration. Moderate spinal stenosis and moderate subarticular stenosis bilaterally.  Mild degenerative changes throughout the remainder of the lumbar spine as detailed above.  Assessment and Plan:  1.  Lumbar spinal stenosis at L4-5 causing numbness into the feet bilaterally.  Symptoms are less frequent and improved with physical therapy.  Continue gabapentin 100mg  at 10a, 2pm, 6pm, and 300mg  at bedtime. If she develops worsening pain, I discussed option for epidural steroid injections going forward.  2.  Nonspecific bilateral lower leg pain, history is not suggestive of nerve related pathology (neuropathy, RLS, radiculopathy).  I doubt that EMG would be helpful, as her symptoms do not correlate to a nerve distribution and lacks numbness, tingling, or weakness.   Follow Up Instructions:   I discussed the assessment and treatment plan with the patient. The patient was provided an opportunity to ask questions  and all were answered. The patient agreed with the plan and demonstrated an understanding of the instructions.   The patient was advised to call back or seek an in-person evaluation if the symptoms worsen or if the condition fails to improve as anticipated.  Total time spent:  25 minutes     Alda Berthold, DO

## 2018-10-14 ENCOUNTER — Other Ambulatory Visit: Payer: Self-pay | Admitting: Neurology

## 2018-10-14 DIAGNOSIS — M48062 Spinal stenosis, lumbar region with neurogenic claudication: Secondary | ICD-10-CM

## 2018-10-14 NOTE — Telephone Encounter (Signed)
Spoke to pt--stated Called pt--stated been holding off the referral for back injections(epidural steroid injection)  and needed to know some more info. Pt stated her husband thinks that she minimize her pain. Please advise

## 2018-10-14 NOTE — Telephone Encounter (Signed)
Please inform patient that it is usually a combination of numbing medication and steroids which are administered into the low portion of the back.  If she is interested in exploring this option, it would be best to meet with the physician performing this and they may be able to discuss other options.  If she is agreeable, please refer to [facility] - Dr. Maryjean Ka or Dr. Brien Few.

## 2018-10-14 NOTE — Telephone Encounter (Signed)
Ordered referral--to [facility]-- for chronic back pain evaluate for ESI. Faxed [phone]

## 2018-10-20 DIAGNOSIS — M6281 Muscle weakness (generalized): Secondary | ICD-10-CM | POA: Diagnosis not present

## 2018-10-20 DIAGNOSIS — G894 Chronic pain syndrome: Secondary | ICD-10-CM | POA: Diagnosis not present

## 2018-10-20 DIAGNOSIS — M545 Low back pain: Secondary | ICD-10-CM | POA: Diagnosis not present

## 2018-10-26 ENCOUNTER — Other Ambulatory Visit: Payer: Self-pay | Admitting: Internal Medicine

## 2018-10-28 DIAGNOSIS — M6281 Muscle weakness (generalized): Secondary | ICD-10-CM | POA: Diagnosis not present

## 2018-10-28 DIAGNOSIS — M545 Low back pain: Secondary | ICD-10-CM | POA: Diagnosis not present

## 2018-10-28 DIAGNOSIS — G894 Chronic pain syndrome: Secondary | ICD-10-CM | POA: Diagnosis not present

## 2018-11-03 NOTE — Telephone Encounter (Signed)
Pt has an appt scheduled for 11/16/18 at 12 with Dr. Brien Few

## 2018-11-04 ENCOUNTER — Telehealth: Payer: Self-pay

## 2018-11-04 DIAGNOSIS — G894 Chronic pain syndrome: Secondary | ICD-10-CM | POA: Diagnosis not present

## 2018-11-04 DIAGNOSIS — M6281 Muscle weakness (generalized): Secondary | ICD-10-CM | POA: Diagnosis not present

## 2018-11-04 DIAGNOSIS — M545 Low back pain: Secondary | ICD-10-CM | POA: Diagnosis not present

## 2018-11-04 NOTE — Telephone Encounter (Signed)
Pt has an appt scheduled for 11/16/18 at 12:00pm with Dr. Brien Few

## 2018-11-08 DIAGNOSIS — Z1231 Encounter for screening mammogram for malignant neoplasm of breast: Secondary | ICD-10-CM | POA: Diagnosis not present

## 2018-11-08 DIAGNOSIS — Z803 Family history of malignant neoplasm of breast: Secondary | ICD-10-CM | POA: Diagnosis not present

## 2018-11-08 LAB — HM MAMMOGRAPHY

## 2018-11-10 DIAGNOSIS — M545 Low back pain: Secondary | ICD-10-CM | POA: Diagnosis not present

## 2018-11-10 DIAGNOSIS — G894 Chronic pain syndrome: Secondary | ICD-10-CM | POA: Diagnosis not present

## 2018-11-10 DIAGNOSIS — M6281 Muscle weakness (generalized): Secondary | ICD-10-CM | POA: Diagnosis not present

## 2018-11-11 ENCOUNTER — Encounter: Payer: Self-pay | Admitting: Internal Medicine

## 2018-11-11 ENCOUNTER — Ambulatory Visit (INDEPENDENT_AMBULATORY_CARE_PROVIDER_SITE_OTHER): Payer: BC Managed Care – PPO | Admitting: Internal Medicine

## 2018-11-11 ENCOUNTER — Other Ambulatory Visit: Payer: Self-pay

## 2018-11-11 DIAGNOSIS — M25561 Pain in right knee: Secondary | ICD-10-CM

## 2018-11-11 DIAGNOSIS — M25562 Pain in left knee: Secondary | ICD-10-CM | POA: Diagnosis not present

## 2018-11-11 MED ORDER — TRAMADOL HCL 50 MG PO TABS: 50.0000 mg | ORAL_TABLET | Freq: Two times a day (BID) | ORAL | 3 refills | Status: DC | PRN

## 2018-11-11 MED ORDER — HYDROCODONE-ACETAMINOPHEN 5-325 MG PO TABS: 0.5000 | ORAL_TABLET | Freq: Four times a day (QID) | ORAL | 0 refills | Status: DC | PRN

## 2018-11-11 NOTE — Progress Notes (Signed)
Subjective:  Patient ID: Rebecca Marks, female    DOB: 1955/11/30  Age: 63 y.o. MRN: [id number]  CC: No chief complaint on file.   HPI [name] presents for OA, LBP  Outpatient Medications Prior to Visit  Medication Sig Dispense Refill  . Ascorbic Acid (VITAMIN C) 1000 MG tablet Take 2,000 mg by mouth 2 (two) times daily.     . Cholecalciferol (VITAMIN D3) 2000 UNITS TABS Take 2 tablets by mouth daily.    . Coenzyme Q10 200 MG capsule Take 200 mg by mouth daily.    [name] CORAL CALCIUM PO Take by mouth. 1000mg -3000mg  daily.    . cyclobenzaprine (FLEXERIL) 10 MG tablet TAKE 1 TABLET (10 MG TOTAL) BY MOUTH AT BEDTIME. AS NEEDED 30 tablet 5  . doxepin (SINEQUAN) 10 MG capsule TAKE 1 CAPSULE BY MOUTH EVERY DAY AT BEDTIME AS NEEDED 90 capsule 1  . Folic Acid-Vit N8-GNF A21 0.4-50-0.1 MG TABS Take by mouth.    . gabapentin (NEURONTIN) 100 MG capsule TAKE 2 CAPSULES (200 MG TOTAL) BY MOUTH 2 (TWO) TIMES DAILY. (Patient taking differently: Take 100 mg by mouth 3 (three) times daily. Pt takes 100mg  three times daily and then 300mg  qhs) 360 capsule 2  . HYDROcodone-acetaminophen (NORCO/VICODIN) 5-325 MG tablet Take 0.5-1 tablets by mouth every 6 (six) hours as needed for severe pain. 20 tablet 0  . ibuprofen (ADVIL,MOTRIN) 800 MG tablet Take 800 mg by mouth at bedtime as needed.    . Nutritional Supplements (JOINT FORMULA PO) Take by mouth.    . Omega-3 Fatty Acids (ULTRA OMEGA 3 PO) Take by mouth. 1-2 grams daily.    [name] PHOSPHATIDYLSERINE PO Take 300 mg by mouth 2 (two) times a day.     . Riboflavin (B2 PO) Take 400 mg by mouth daily.    [name] selenium 50 MCG TABS tablet Take 200 mcg by mouth daily.    . traMADol (ULTRAM) 50 MG tablet TAKE 1 TABLET BY MOUTH TWICE A DAY AS NEEDED FOR PAIN 60 tablet 3  . VOLTAREN 1 % GEL Apply 1 application topically at bedtime. Applied to knee, hand, fingers.    . zaleplon (SONATA) 10 MG capsule Take 1 capsule (10 mg total) by mouth at bedtime as needed for  sleep. 90 capsule 1   No facility-administered medications prior to visit.     ROS: Review of Systems  Constitutional: Negative for activity change, appetite change, chills, fatigue and unexpected weight change.  HENT: Negative for congestion, mouth sores and sinus pressure.   Eyes: Negative for visual disturbance.  Respiratory: Negative for cough and chest tightness.   Gastrointestinal: Negative for abdominal pain and nausea.  Genitourinary: Negative for difficulty urinating, frequency and vaginal pain.  Musculoskeletal: Positive for arthralgias and back pain. Negative for gait problem.  Skin: Negative for pallor and rash.  Neurological: Negative for dizziness, tremors, weakness, numbness and headaches.  Psychiatric/Behavioral: Negative for confusion and sleep disturbance.    Objective:  BP 108/72 (BP Location: Left Arm, Patient Position: Sitting, Cuff Size: Normal)   Pulse 87   Temp 98.1 F (36.7 C) (Oral)   Ht 5' 6.75" (1.695 m)   Wt 129 lb (58.5 kg)   SpO2 99%   BMI 20.36 kg/m   BP Readings from Last 3 Encounters:  11/11/18 108/72  07/16/18 104/66  07/08/18 124/76    Wt Readings from Last 3 Encounters:  11/11/18 129 lb (58.5 kg)  10/13/18 126 lb 5 oz (57.3 kg)  07/16/18 128 lb (  58.1 kg)    Physical Exam Constitutional:      General: She is not in acute distress.    Appearance: She is well-developed.  HENT:     Head: Normocephalic.     Right Ear: External ear normal.     Left Ear: External ear normal.     Nose: Nose normal.  Eyes:     General:        Right eye: No discharge.        Left eye: No discharge.     Conjunctiva/sclera: Conjunctivae normal.     Pupils: Pupils are equal, round, and reactive to light.  Neck:     Musculoskeletal: Normal range of motion and neck supple.     Thyroid: No thyromegaly.     Vascular: No JVD.     Trachea: No tracheal deviation.  Cardiovascular:     Rate and Rhythm: Normal rate and regular rhythm.     Heart sounds:  Normal heart sounds.  Pulmonary:     Effort: No respiratory distress.     Breath sounds: No stridor. No wheezing.  Abdominal:     General: Bowel sounds are normal. There is no distension.     Palpations: Abdomen is soft. There is no mass.     Tenderness: There is no abdominal tenderness. There is no guarding or rebound.  Musculoskeletal:        General: No tenderness.  Lymphadenopathy:     Cervical: No cervical adenopathy.  Skin:    Findings: No erythema or rash.  Neurological:     Mental Status: She is oriented to person, place, and time.     Cranial Nerves: No cranial nerve deficit.     Motor: No abnormal muscle tone.     Coordination: Coordination normal.     Gait: Gait abnormal.     Deep Tendon Reflexes: Reflexes normal.  Psychiatric:        Behavior: Behavior normal.        Thought Content: Thought content normal.        Judgment: Judgment normal.     Lab Results  Component Value Date   WBC 9.1 04/21/2018   HGB 15.0 04/21/2018   HCT 42.1 04/21/2018   PLT 286.0 04/21/2018   GLUCOSE 91 07/08/2018   CHOL 164 04/21/2018   TRIG 129.0 04/21/2018   HDL 48.70 04/21/2018   LDLCALC 90 04/21/2018   ALT 7 04/21/2018   AST 17 04/21/2018   NA 140 07/08/2018   K 3.8 07/08/2018   CL 102 07/08/2018   CREATININE 0.82 07/08/2018   BUN 18 07/08/2018   CO2 30 07/08/2018   TSH 2.04 04/21/2018   INR 1.0 09/12/2015   HGBA1C 4.4 (L) 07/30/2006    Mr Lumbar Spine Wo Contrast  Result Date: 07/29/2018 CLINICAL DATA:  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 Hyper reflexia EXAM: MRI LUMBAR SPINE WITHOUT CONTRAST TECHNIQUE: Multiplanar, multisequence MR imaging of the lumbar spine was performed. No intravenous contrast was administered. COMPARISON:  Lumbar radiographs 08/26/2017 FINDINGS: Segmentation:  Normal Alignment:  5 mm anterolisthesis L4-5.  Remaining alignment normal. Vertebrae:  Normal bone marrow.  Negative for fracture or mass. Conus medullaris and cauda  equina: Conus extends to the T12-L1 level. Conus and cauda equina appear normal. Paraspinal and other soft tissues: Negative for paraspinous mass or edema Disc levels: L1-2: Mild disc and mild facet degeneration.  Negative L2-3: Mild facet degeneration.  Negative for stenosis L3-4: Mild disc bulging. Small extraforaminal disc protrusion on  the left. Bilateral facet degeneration. Negative for stenosis. L4-5: 5 mm anterolisthesis with severe facet degeneration. Moderate spinal stenosis. Moderate subarticular stenosis bilaterally. L5-S1: Bilateral facet degeneration.  No significant stenosis. IMPRESSION: 5 mm anterolisthesis L4-5 with severe facet degeneration. Moderate spinal stenosis and moderate subarticular stenosis bilaterally Mild degenerative changes throughout the remainder of the lumbar spine as detailed above. Electronically Signed   By: Franchot Gallo M.D.   On: 07/29/2018 15:16    Assessment & Plan:   Diagnoses and all orders for this visit:  Bilateral anterior knee pain     No orders of the defined types were placed in this encounter.    Follow-up: No follow-ups on file.  Walker Kehr, MD

## 2018-11-16 DIAGNOSIS — M48062 Spinal stenosis, lumbar region with neurogenic claudication: Secondary | ICD-10-CM | POA: Diagnosis not present

## 2018-11-16 DIAGNOSIS — M48061 Spinal stenosis, lumbar region without neurogenic claudication: Secondary | ICD-10-CM | POA: Insufficient documentation

## 2018-11-16 DIAGNOSIS — M47816 Spondylosis without myelopathy or radiculopathy, lumbar region: Secondary | ICD-10-CM | POA: Diagnosis not present

## 2018-11-17 ENCOUNTER — Encounter: Payer: Self-pay | Admitting: Internal Medicine

## 2018-11-17 DIAGNOSIS — G894 Chronic pain syndrome: Secondary | ICD-10-CM | POA: Diagnosis not present

## 2018-11-17 DIAGNOSIS — M6281 Muscle weakness (generalized): Secondary | ICD-10-CM | POA: Diagnosis not present

## 2018-11-17 DIAGNOSIS — M545 Low back pain: Secondary | ICD-10-CM | POA: Diagnosis not present

## 2018-11-19 ENCOUNTER — Ambulatory Visit: Payer: Self-pay | Admitting: Internal Medicine

## 2018-11-19 NOTE — Telephone Encounter (Signed)
  Pt. Started feeling bad yesterday - cough,achy, fatigue, cough. Practice is closed today. Pt. And husband state she does not feel well. Will go to UC. Answer Assessment - Initial Assessment Questions 1. COVID-19 DIAGNOSIS: "Who made your Coronavirus (COVID-19) diagnosis?" "Was it confirmed by a positive lab test?" If not diagnosed by a HCP, ask "Are there lots of cases (community spread) where you live?" (See public health department website, if unsure)     No test 2. ONSET: "When did the COVID-19 symptoms start?"      Last night 3. WORST SYMPTOM: "What is your worst symptom?" (e.g., cough, fever, shortness of breath, muscle aches)     Achy, fatigue, sweaty, cough 4. COUGH: "Do you have a cough?" If so, ask: "How bad is the cough?"       Yes 5. FEVER: "Do you have a fever?" If so, ask: "What is your temperature, how was it measured, and when did it start?"     No 6. RESPIRATORY STATUS: "Describe your breathing?" (e.g., shortness of breath, wheezing, unable to speak)      No 7. BETTER-SAME-WORSE: "Are you getting better, staying the same or getting worse compared to yesterday?"  If getting worse, ask, "In what way?"     Worse 8. HIGH RISK DISEASE: "Do you have any chronic medical problems?" (e.g., asthma, heart or lung disease, weak immune system, etc.)     No 9. PREGNANCY: "Is there any chance you are pregnant?" "When was your last menstrual period?"     No 10. OTHER SYMPTOMS: "Do you have any other symptoms?"  (e.g., chills, fatigue, headache, loss of smell or taste, muscle pain, sore throat)       Fatigue, cough, achy  Protocols used: CORONAVIRUS (COVID-19) DIAGNOSED OR SUSPECTED-A-AH

## 2018-11-22 NOTE — Telephone Encounter (Signed)
Patient called and says she's been expecting a call from the office from this triage call on Friday. I advised it's notes she was going to the UC, she says she decided to tough it out and wait on the call from the office. I asked about her symptoms today. She says she was in the bed all weekend feeling fatigued and down for the count. She says she felt better today and went out for a bit. She says she's still feeling fatigue, cough off and on, body aches. She denies fever, denies SOB. I called the office and spoke to Sam, [facility] who asked to speak to the patient, the call was connected successfully.

## 2018-11-22 NOTE — Telephone Encounter (Signed)
noted 

## 2018-11-22 NOTE — Telephone Encounter (Signed)
Has appt. tomorrow

## 2018-11-22 NOTE — Telephone Encounter (Signed)
This encounter was created in error - please disregard.

## 2018-11-23 ENCOUNTER — Ambulatory Visit (INDEPENDENT_AMBULATORY_CARE_PROVIDER_SITE_OTHER): Payer: BC Managed Care – PPO | Admitting: Internal Medicine

## 2018-11-23 ENCOUNTER — Encounter: Payer: Self-pay | Admitting: Internal Medicine

## 2018-11-23 DIAGNOSIS — B349 Viral infection, unspecified: Secondary | ICD-10-CM

## 2018-11-23 DIAGNOSIS — Z20828 Contact with and (suspected) exposure to other viral communicable diseases: Secondary | ICD-10-CM

## 2018-11-23 NOTE — Assessment & Plan Note (Signed)
Much better today Ref for COVID19 testing - [facility] Call if worse Hydrate well

## 2018-11-23 NOTE — Progress Notes (Signed)
Virtual Visit via Video Note  I connected with Rebecca Marks on 11/23/18 at  1:40 PM EDT by a video enabled telemedicine application and verified that I am speaking with the correct person using two identifiers.   I discussed the limitations of evaluation and management by telemedicine and the availability of in person appointments. The patient expressed understanding and agreed to proceed.  History of Present Illness: C/o cough, fatigue, achyness x x5-6 days. It started after she overheated weeding (possibly). Feeling mch better today. Poss COVID exposure at the doctor's office  There has been no runny nose,chest pain, shortness of breath, abdominal pain, diarrhea, constipation,  skin rashes.   Observations/Objective: The patient appears to be in no acute distress, looks well.  Assessment and Plan:  See my Assessment and Plan. Follow Up Instructions:    I discussed the assessment and treatment plan with the patient. The patient was provided an opportunity to ask questions and all were answered. The patient agreed with the plan and demonstrated an understanding of the instructions.   The patient was advised to call back or seek an in-person evaluation if the symptoms worsen or if the condition fails to improve as anticipated.  I provided face-to-face time during this encounter. We were at different locations.   Walker Kehr, MD

## 2018-11-24 ENCOUNTER — Telehealth: Payer: Self-pay | Admitting: *Deleted

## 2018-11-24 ENCOUNTER — Other Ambulatory Visit: Payer: BC Managed Care – PPO

## 2018-11-24 DIAGNOSIS — R6889 Other general symptoms and signs: Secondary | ICD-10-CM | POA: Diagnosis not present

## 2018-11-24 DIAGNOSIS — Z20822 Contact with and (suspected) exposure to covid-19: Secondary | ICD-10-CM

## 2018-11-24 NOTE — Telephone Encounter (Signed)
-----   Message from [place], [place] sent at 11/23/2018  2:28 PM EDT ----- Regarding: covid screening Covid screening per Dr. Alain Marion for exposure.

## 2018-11-24 NOTE — Telephone Encounter (Signed)
Pt. Called back and scheduled for today. 

## 2018-11-24 NOTE — Telephone Encounter (Signed)
LM for pt to return our call @ [phone] M-F [id number]-7p to schedule covid testing. Order placed

## 2018-11-30 LAB — NOVEL CORONAVIRUS, NAA: SARS-CoV-2, NAA: NOT DETECTED

## 2018-12-01 DIAGNOSIS — M6281 Muscle weakness (generalized): Secondary | ICD-10-CM | POA: Diagnosis not present

## 2018-12-01 DIAGNOSIS — M545 Low back pain: Secondary | ICD-10-CM | POA: Diagnosis not present

## 2018-12-01 DIAGNOSIS — G894 Chronic pain syndrome: Secondary | ICD-10-CM | POA: Diagnosis not present

## 2018-12-02 NOTE — Telephone Encounter (Signed)
Pt called and was given negative covid results

## 2018-12-07 DIAGNOSIS — M48062 Spinal stenosis, lumbar region with neurogenic claudication: Secondary | ICD-10-CM | POA: Diagnosis not present

## 2018-12-14 DIAGNOSIS — G894 Chronic pain syndrome: Secondary | ICD-10-CM | POA: Diagnosis not present

## 2018-12-14 DIAGNOSIS — M545 Low back pain: Secondary | ICD-10-CM | POA: Diagnosis not present

## 2018-12-14 DIAGNOSIS — M6281 Muscle weakness (generalized): Secondary | ICD-10-CM | POA: Diagnosis not present

## 2018-12-16 ENCOUNTER — Other Ambulatory Visit: Payer: Self-pay | Admitting: Internal Medicine

## 2018-12-16 MED ORDER — CIPROFLOXACIN HCL 250 MG PO TABS: 250.0000 mg | ORAL_TABLET | Freq: Two times a day (BID) | ORAL | 0 refills | Status: DC

## 2018-12-22 DIAGNOSIS — M6281 Muscle weakness (generalized): Secondary | ICD-10-CM | POA: Diagnosis not present

## 2018-12-22 DIAGNOSIS — M545 Low back pain: Secondary | ICD-10-CM | POA: Diagnosis not present

## 2018-12-22 DIAGNOSIS — G894 Chronic pain syndrome: Secondary | ICD-10-CM | POA: Diagnosis not present

## 2019-01-04 IMAGING — CT CT HEAD W/O CM
3 series · 15 of 33 positions shown, 18 images · non-contrast
Comparison: None.

CLINICAL DATA: Fall

EXAM:
CT HEAD WITHOUT CONTRAST
TECHNIQUE: Contiguous axial images were obtained from the base of the skull
through the vertex without intravenous contrast.

[Series 2: head 5.0 h37s · axial · 0.40mm/px · z∈[+128,+238]mm · 7 of 28 slices shown, 9 images]
[im 3/28  soft-tissue]
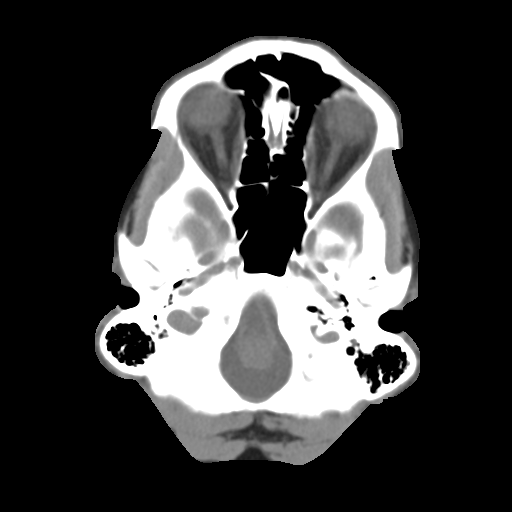
[im 3/28  bone]
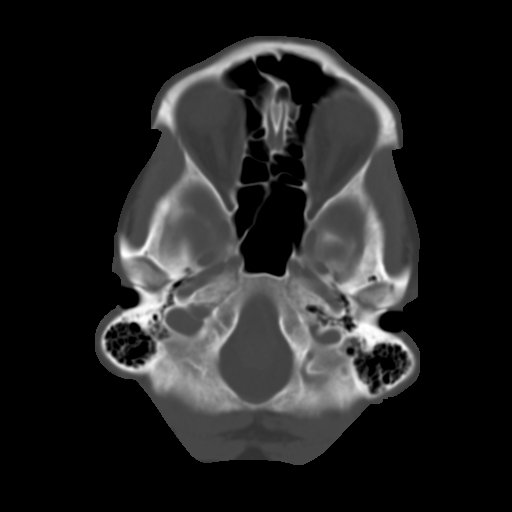
[im 7/28  bone]
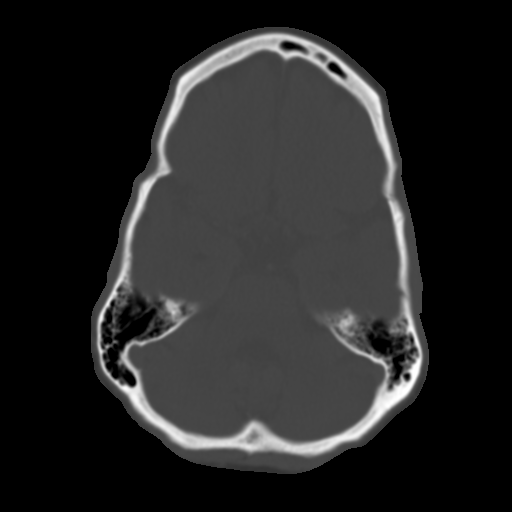
[im 11/28  bone]
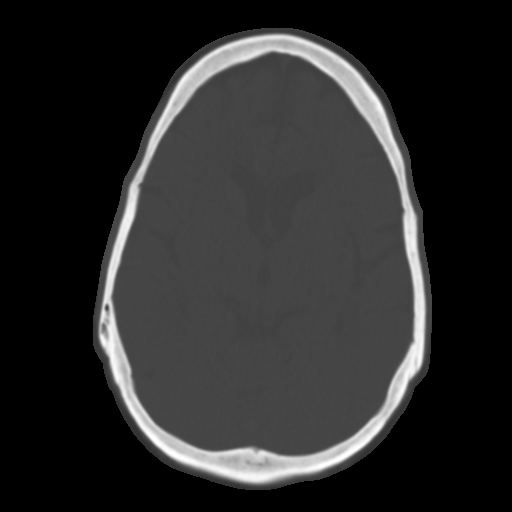
[im 15/28  bone]
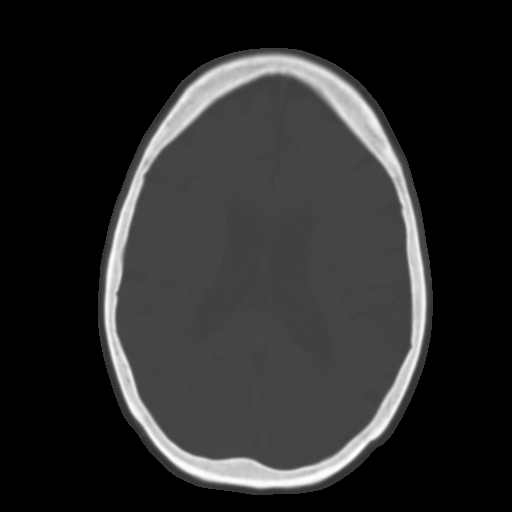
[im 17/28  soft-tissue]
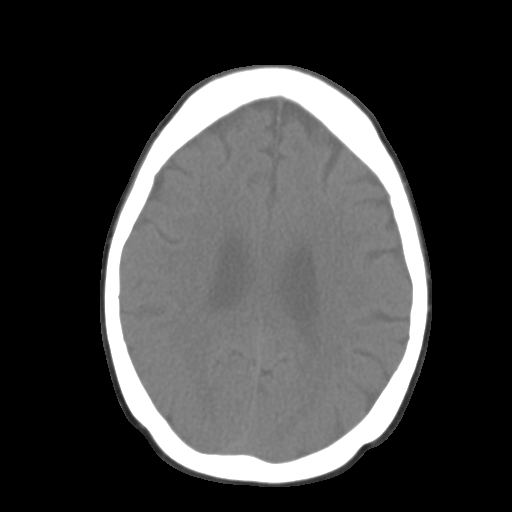
[im 17/28  bone]
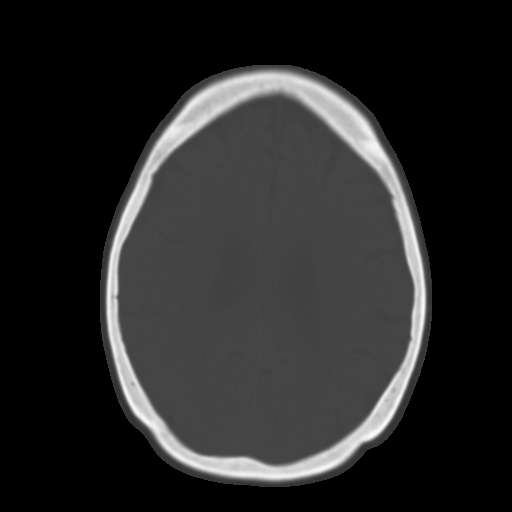
[im 21/28  bone]
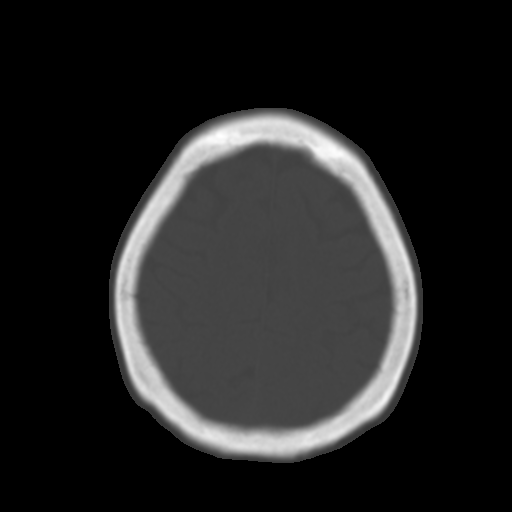
[im 25/28  bone]
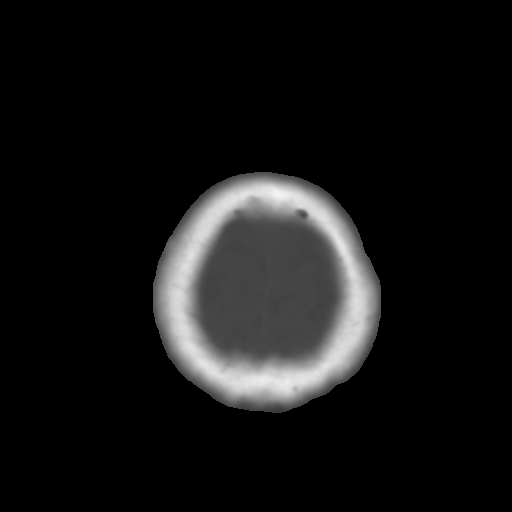

[Series 4: head 3.0 mpr cor · coronal · 0.28mm/px · 3 of 63 slices shown]
[im 13/63  bone]
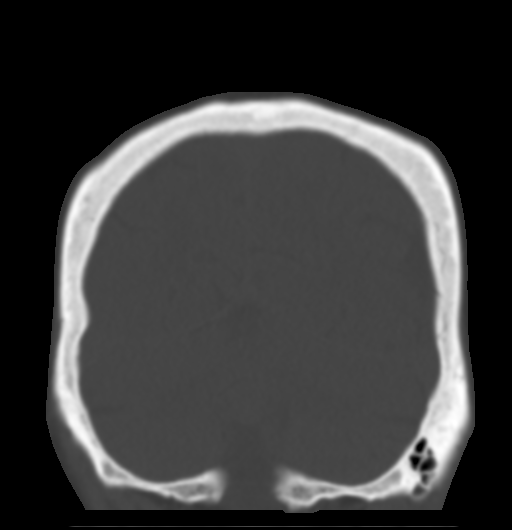
[im 25/63  bone]
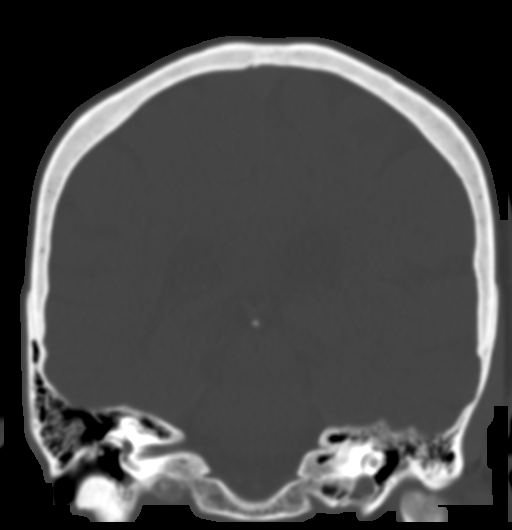
[im 38/63  bone]
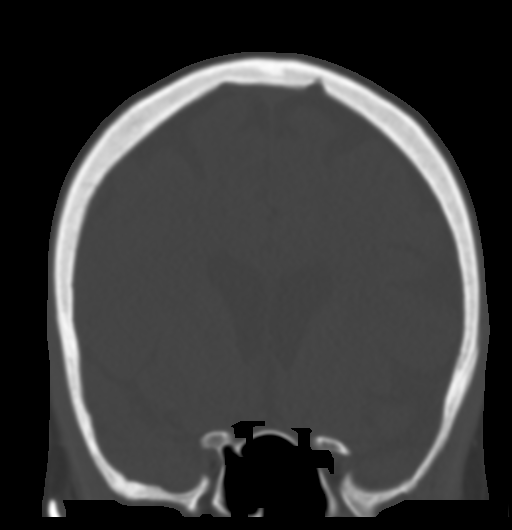

[Series 5: head 3.0 mpr sag · sagittal · 0.28mm/px · 5 of 48 slices shown, 6 images]
[im 16/48  bone]
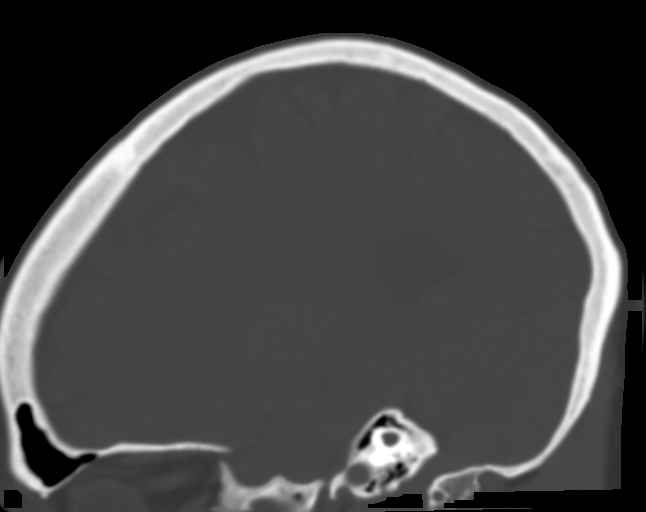
[im 20/48  bone]
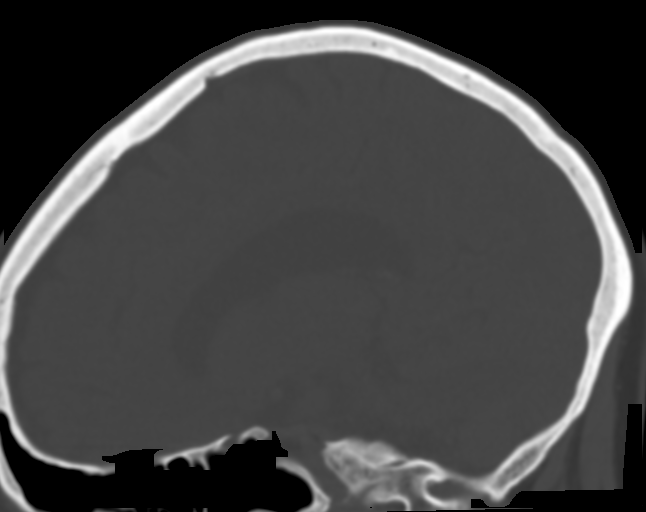
[im 24/48  soft-tissue]
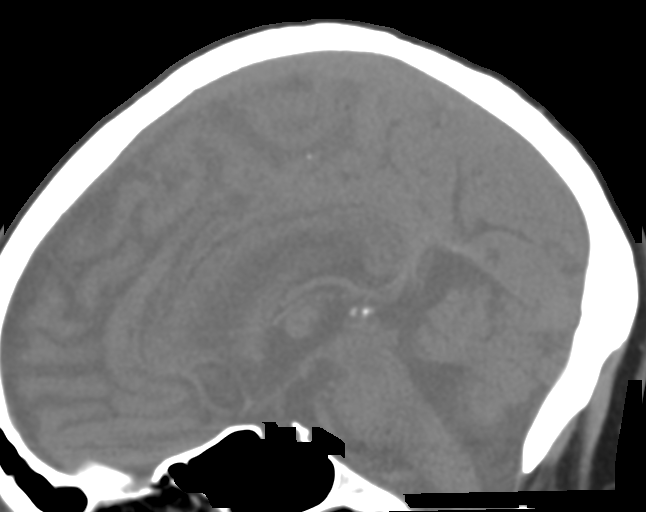
[im 24/48  bone]
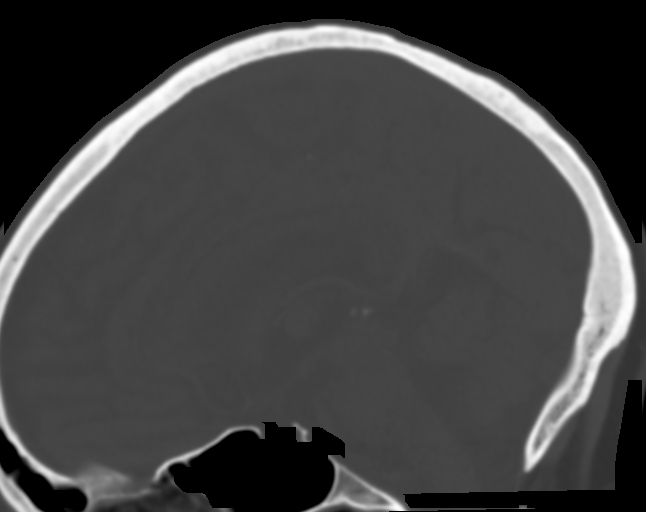
[im 28/48  bone]
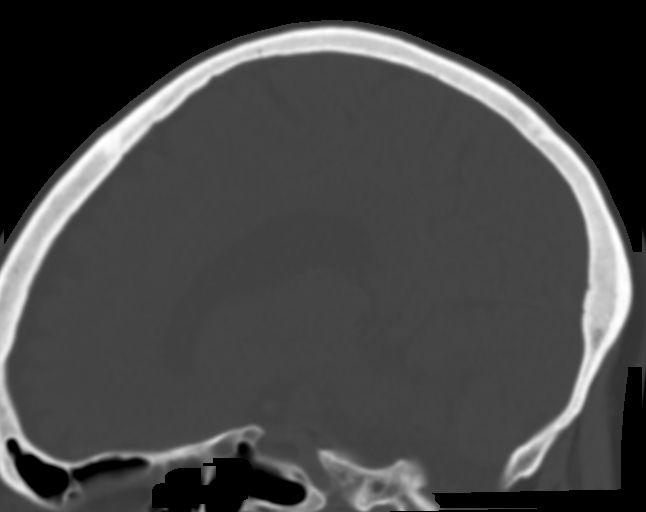
[im 32/48  bone]
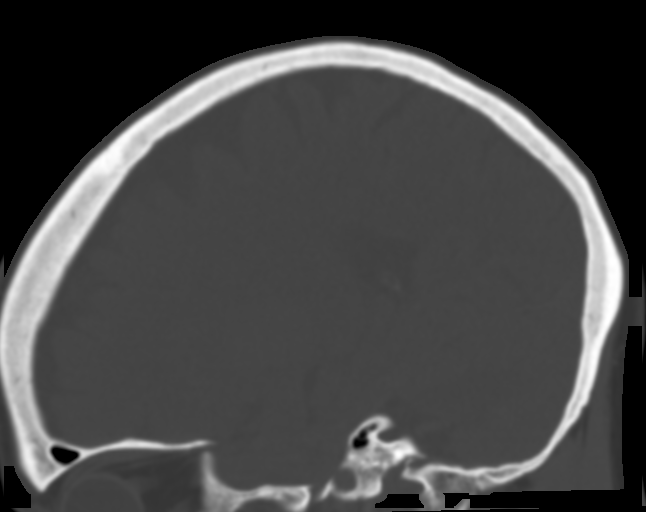

[15 of 33 positions shown; findings below may reference images not displayed]

FINDINGS: Brain: No mass lesion, intraparenchymal hemorrhage or extra-axial
collection. No evidence of acute cortical infarct. Brain parenchyma
and CSF-containing spaces are normal for age.

Vascular: No hyperdense vessel or unexpected calcification.

Skull: Normal visualized skull base, calvarium and extracranial soft
tissues.

Sinuses/Orbits: No sinus fluid levels or advanced mucosal
thickening. No mastoid effusion. Normal orbits.
IMPRESSION: Normal head CT.

## 2019-01-10 DIAGNOSIS — M47816 Spondylosis without myelopathy or radiculopathy, lumbar region: Secondary | ICD-10-CM | POA: Diagnosis not present

## 2019-01-10 DIAGNOSIS — M48062 Spinal stenosis, lumbar region with neurogenic claudication: Secondary | ICD-10-CM | POA: Diagnosis not present

## 2019-01-12 DIAGNOSIS — G894 Chronic pain syndrome: Secondary | ICD-10-CM | POA: Diagnosis not present

## 2019-01-12 DIAGNOSIS — M6281 Muscle weakness (generalized): Secondary | ICD-10-CM | POA: Diagnosis not present

## 2019-01-12 DIAGNOSIS — M545 Low back pain: Secondary | ICD-10-CM | POA: Diagnosis not present

## 2019-01-13 DIAGNOSIS — H43812 Vitreous degeneration, left eye: Secondary | ICD-10-CM | POA: Diagnosis not present

## 2019-01-19 DIAGNOSIS — M47816 Spondylosis without myelopathy or radiculopathy, lumbar region: Secondary | ICD-10-CM | POA: Diagnosis not present

## 2019-01-19 DIAGNOSIS — M48062 Spinal stenosis, lumbar region with neurogenic claudication: Secondary | ICD-10-CM | POA: Diagnosis not present

## 2019-01-26 DIAGNOSIS — G894 Chronic pain syndrome: Secondary | ICD-10-CM | POA: Diagnosis not present

## 2019-01-26 DIAGNOSIS — M6281 Muscle weakness (generalized): Secondary | ICD-10-CM | POA: Diagnosis not present

## 2019-01-26 DIAGNOSIS — M545 Low back pain: Secondary | ICD-10-CM | POA: Diagnosis not present

## 2019-02-02 DIAGNOSIS — Z23 Encounter for immunization: Secondary | ICD-10-CM | POA: Diagnosis not present

## 2019-02-15 DIAGNOSIS — M48062 Spinal stenosis, lumbar region with neurogenic claudication: Secondary | ICD-10-CM | POA: Diagnosis not present

## 2019-03-11 ENCOUNTER — Ambulatory Visit (INDEPENDENT_AMBULATORY_CARE_PROVIDER_SITE_OTHER): Payer: BC Managed Care – PPO | Admitting: Internal Medicine

## 2019-03-11 DIAGNOSIS — F419 Anxiety disorder, unspecified: Secondary | ICD-10-CM | POA: Diagnosis not present

## 2019-03-11 DIAGNOSIS — G8929 Other chronic pain: Secondary | ICD-10-CM

## 2019-03-11 DIAGNOSIS — R5382 Chronic fatigue, unspecified: Secondary | ICD-10-CM

## 2019-03-11 DIAGNOSIS — M5442 Lumbago with sciatica, left side: Secondary | ICD-10-CM

## 2019-03-11 DIAGNOSIS — M5441 Lumbago with sciatica, right side: Secondary | ICD-10-CM | POA: Diagnosis not present

## 2019-03-11 MED ORDER — ZALEPLON 10 MG PO CAPS: 10.0000 mg | ORAL_CAPSULE | Freq: Every evening | ORAL | 1 refills | Status: DC | PRN

## 2019-03-11 NOTE — Progress Notes (Signed)
Patient ID: Rebecca Marks, female   DOB: 1955/11/06, 63 y.o.   MRN: [id number]  Virtual Visit via Video Note  I connected with Cydney Ok on 03/12/19 at  2:20 PM EDT by a video enabled telemedicine application and verified that I am speaking with the correct person using two identifiers.  Location: Patient: at home Provider: at office   I discussed the limitations of evaluation and management by telemedicine and the availability of in person appointments. The patient expressed understanding and agreed to proceed.  History of Present Illness: Here to f/u; overall doing ok,  Pt denies chest pain, increasing sob or doe, wheezing, orthopnea, PND, increased LE swelling, palpitations, dizziness or syncope.  Pt denies new neurological symptoms such as new headache, or facial or extremity weakness or numbness.  Pt denies polydipsia, polyuria, Denies worsening depressive symptoms, suicidal ideation, or panic; has ongoing anxiety.  Pt continues to have recurring LBP without change in severity, bowel or bladder change, fever, wt loss,  worsening LE pain/numbness/weakness, gait change or falls.  Has had variable improvement with ESI x 2 recent.  Does c/o ongoing fatigue, but denies signficant daytime hypersomnolence. Past Medical History:  Diagnosis Date  . Allergy   . Anxiety   . Arthritis    R wrist and R knee  . CAP (community acquired pneumonia) 4/17-22/15   Rochephin & IV Zithromax & 3 days Levaquin  . Chronic back pain   . DVT (deep venous thrombosis) ([place])    a. in her 20s while on OCPs.  . Hepatitis C    contaminated blood @ her job, prior notes indicate spontaneously cleared  . History of tobacco abuse   . Osteoarthritis   . Palpitations   . Premature atrial contractions    a. Holter 11/2013: few PACs.  . Pulmonary function study abnormality    a. 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  . Raynaud phenomenon   . Ruptured lumbar disc    4 herniated disc   .  Volume overload    a.  history of volume overload during hospital stay for PNA in 08/2013 felt iatrogenic due to receiving a lot of IV fluid. 2D echo 08/2013 showed EF 123456, normal diastolic function parameters.   Past Surgical History:  Procedure Laterality Date  . CHOLECYSTECTOMY  1985    reports that she quit smoking about 5 years ago. Her smoking use included cigarettes. She has a 6.25 pack-year smoking history. She has never used smokeless tobacco. She reports current alcohol use. She reports that she does not use drugs. family history includes Alzheimer's disease in her maternal grandmother; Breast cancer in her mother and sister; Diabetes in her father; Heart attack in her father; Heart disease in her father; Hypertension in her father; Lymphoma in her father; Stroke in her maternal grandfather; Stroke (age of onset: 80) in her mother. Allergies  Allergen Reactions  . Ritalin [Methylphenidate Hcl]     Too hyper   Current Outpatient Medications on File Prior to Visit  Medication Sig Dispense Refill  . Ascorbic Acid (VITAMIN C) 1000 MG tablet Take 2,000 mg by mouth 2 (two) times daily.     . Cholecalciferol (VITAMIN D3) 2000 UNITS TABS Take 2 tablets by mouth daily.    . ciprofloxacin (CIPRO) 250 MG tablet Take 1 tablet (250 mg total) by mouth 2 (two) times daily. 6 tablet 0  . Coenzyme Q10 200 MG capsule Take 200 mg by mouth daily.    [name] CORAL CALCIUM PO Take  by mouth. 1000mg -3000mg  daily.    . cyclobenzaprine (FLEXERIL) 10 MG tablet TAKE 1 TABLET (10 MG TOTAL) BY MOUTH AT BEDTIME. AS NEEDED 30 tablet 5  . doxepin (SINEQUAN) 10 MG capsule TAKE 1 CAPSULE BY MOUTH EVERY DAY AT BEDTIME AS NEEDED 90 capsule 1  . Folic Acid-Vit Q000111Q 123456 0.4-50-0.1 MG TABS Take by mouth.    . gabapentin (NEURONTIN) 100 MG capsule TAKE 2 CAPSULES (200 MG TOTAL) BY MOUTH 2 (TWO) TIMES DAILY. (Patient taking differently: Take 100 mg by mouth 3 (three) times daily. Pt takes 100mg  three times daily and then 300mg   qhs) 360 capsule 2  . HYDROcodone-acetaminophen (NORCO/VICODIN) 5-325 MG tablet Take 0.5-1 tablets by mouth every 6 (six) hours as needed for severe pain. 100 tablet 0  . ibuprofen (ADVIL,MOTRIN) 800 MG tablet Take 800 mg by mouth at bedtime as needed.    . Nutritional Supplements (JOINT FORMULA PO) Take by mouth.    . Omega-3 Fatty Acids (ULTRA OMEGA 3 PO) Take by mouth. 1-2 grams daily.    [name] PHOSPHATIDYLSERINE PO Take 300 mg by mouth 2 (two) times a day.     . Riboflavin (B2 PO) Take 400 mg by mouth daily.    [name] selenium 50 MCG TABS tablet Take 200 mcg by mouth daily.    . traMADol (ULTRAM) 50 MG tablet Take 1-2 tablets (50-100 mg total) by mouth 2 (two) times daily as needed for moderate pain. for pain 100 tablet 3  . VOLTAREN 1 % GEL Apply 1 application topically at bedtime. Applied to knee, hand, fingers.     No current facility-administered medications on file prior to visit.     Observations/Objective: Alert, NAD, appropriate mood and affect, resps normal, cn 2-12 intact, moves all 4s, no visible rash or swelling Lab Results  Component Value Date   WBC 9.1 04/21/2018   HGB 15.0 04/21/2018   HCT 42.1 04/21/2018   PLT 286.0 04/21/2018   GLUCOSE 91 07/08/2018   CHOL 164 04/21/2018   TRIG 129.0 04/21/2018   HDL 48.70 04/21/2018   LDLCALC 90 04/21/2018   ALT 7 04/21/2018   AST 17 04/21/2018   NA 140 07/08/2018   K 3.8 07/08/2018   CL 102 07/08/2018   CREATININE 0.82 07/08/2018   BUN 18 07/08/2018   CO2 30 07/08/2018   TSH 2.04 04/21/2018   INR 1.0 09/12/2015   HGBA1C 4.4 (L) 07/30/2006   Assessment and Plan: See notes  Follow Up Instructions: See notes   I discussed the assessment and treatment plan with the patient. The patient was provided an opportunity to ask questions and all were answered. The patient agreed with the plan and demonstrated an understanding of the instructions.   The patient was advised to call back or seek an in-person evaluation if the symptoms  worsen or if the condition fails to improve as anticipated.   Cathlean Cower, MD

## 2019-03-12 ENCOUNTER — Encounter: Payer: Self-pay | Admitting: Internal Medicine

## 2019-03-12 NOTE — Assessment & Plan Note (Signed)
Ongoing, no recent worsening falls, confusion or new injury, declines need for PT

## 2019-03-12 NOTE — Assessment & Plan Note (Signed)
stable overall by history and exam, recent data reviewed with pt, and pt to continue medical treatment as before,  to f/u any worsening symptoms or concerns  

## 2019-03-12 NOTE — Patient Instructions (Signed)
Please continue all other medications as before, and refills have been done if requested.  Please have the pharmacy call with any other refills you may need.  Please continue your efforts at being more active, low cholesterol diet, and weight control.  Please keep your appointments with your specialists as you may have planned     

## 2019-03-20 ENCOUNTER — Other Ambulatory Visit: Payer: Self-pay | Admitting: Internal Medicine

## 2019-03-21 DIAGNOSIS — F4323 Adjustment disorder with mixed anxiety and depressed mood: Secondary | ICD-10-CM | POA: Diagnosis not present

## 2019-04-21 ENCOUNTER — Other Ambulatory Visit: Payer: Self-pay | Admitting: Internal Medicine

## 2019-04-21 MED ORDER — TRAMADOL HCL 50 MG PO TABS: 50.0000 mg | ORAL_TABLET | Freq: Two times a day (BID) | ORAL | 0 refills | Status: DC | PRN

## 2019-04-21 NOTE — Telephone Encounter (Signed)
Copied from [place] [id number]. Topic: Quick Communication - Rx Refill/Question >> Apr 21, 2019 10:28 AM Leward Quan A wrote: Medication: traMADol Veatrice Bourbon) 50 MG tablet  Appointment scheduled for 04/26/2019  Has the patient contacted their pharmacy? Yes.   (Agent: If no, request that the patient contact the pharmacy for the refill.) (Agent: If yes, when and what did the pharmacy advise?)  Preferred Pharmacy (with phone number or street name): [id number] [address], [place] [phone] (Phone) [phone] (Fax)    Agent: Please be advised that RX refills may take up to 3 business days. We ask that you follow-up with your pharmacy.

## 2019-04-21 NOTE — Telephone Encounter (Signed)
Requested medication (s) are due for refill today: yes  Requested medication (s) are on the active medication list: yes   Last refill: 11/11/2018  #100 3 refills  Future visit scheduled Yes  Notes to clinic:Not delegated  Requested Prescriptions  Pending Prescriptions Disp Refills   traMADol (ULTRAM) 50 MG tablet 100 tablet 3    Sig: Take 1-2 tablets (50-100 mg total) by mouth 2 (two) times daily as needed for moderate pain. for pain     Not Delegated - Analgesics:  Opioid Agonists Failed - 04/21/2019 10:40 AM      Failed - This refill cannot be delegated      Failed - Urine Drug Screen completed in last 360 days.      Passed - Valid encounter within last 6 months    Recent Outpatient Visits          1 month ago Chronic bilateral low back pain with bilateral sciatica   [place], James W, MD   4 months ago Viral syndrome   [place] Plotnikov, Evie Lacks, MD   5 months ago Bilateral anterior knee pain   [place], MD   7 months ago Chronic bilateral low back pain with bilateral sciatica   [place], MD   9 months ago Need for shingles vaccine   [place], MD      Future Appointments            In 5 days Plotnikov, Evie Lacks, MD [place], [place]

## 2019-04-26 ENCOUNTER — Other Ambulatory Visit: Payer: Self-pay

## 2019-04-26 ENCOUNTER — Encounter: Payer: Self-pay | Admitting: Internal Medicine

## 2019-04-26 ENCOUNTER — Ambulatory Visit (INDEPENDENT_AMBULATORY_CARE_PROVIDER_SITE_OTHER): Payer: BC Managed Care – PPO | Admitting: Internal Medicine

## 2019-04-26 DIAGNOSIS — M25562 Pain in left knee: Secondary | ICD-10-CM

## 2019-04-26 DIAGNOSIS — M25561 Pain in right knee: Secondary | ICD-10-CM

## 2019-04-26 DIAGNOSIS — M1991 Primary osteoarthritis, unspecified site: Secondary | ICD-10-CM | POA: Diagnosis not present

## 2019-04-26 MED ORDER — TRAMADOL HCL 50 MG PO TABS: 50.0000 mg | ORAL_TABLET | Freq: Two times a day (BID) | ORAL | 2 refills | Status: DC | PRN

## 2019-04-26 MED ORDER — HYDROCODONE-ACETAMINOPHEN 5-325 MG PO TABS: 0.5000 | ORAL_TABLET | Freq: Four times a day (QID) | ORAL | 0 refills | Status: DC | PRN

## 2019-04-26 NOTE — Assessment & Plan Note (Signed)
The pain is manageable with to 3 tramadol's a day and 1 hydrocodone.  Rebecca Marks would like to stay on the same regimen, otherwise the pain is out of control.  We will continue with current management.  Potential drug interactions discussed.  Potential benefits of a long term opioids use as well as potential risks (i.e. addiction risk, apnea etc) and complications (i.e. Somnolence, constipation and others) were explained to the patient and were aknowledged.

## 2019-04-26 NOTE — Progress Notes (Signed)
Virtual Visit via Video Note  I connected with Rebecca Marks on 04/26/19 at  1:20 PM EST by a video enabled telemedicine application and verified that I am speaking with the correct person using two identifiers.   I discussed the limitations of evaluation and management by telemedicine and the availability of in person appointments. The patient expressed understanding and agreed to proceed.  History of Present Illness: We need to follow-up on arthralgias, OA.  Things are worse in cold weather and rain.  The pain is manageable with to 3 tramadol's a day and 1 hydrocodone.  Rebecca Marks would like to stay on the same regimen, otherwise the pain is out of control.  There has been no runny nose, cough, chest pain, shortness of breath, abdominal pain, diarrhea, constipation,  skin rashes.   Observations/Objective: The patient appears to be in no acute distress.  Assessment and Plan:  See my Assessment and Plan. Follow Up Instructions:    I discussed the assessment and treatment plan with the patient. The patient was provided an opportunity to ask questions and all were answered. The patient agreed with the plan and demonstrated an understanding of the instructions.   The patient was advised to call back or seek an in-person evaluation if the symptoms worsen or if the condition fails to improve as anticipated.  I provided face-to-face time during this encounter. We were at different locations.   Walker Kehr, MD

## 2019-05-06 ENCOUNTER — Other Ambulatory Visit: Payer: Self-pay | Admitting: Neurology

## 2019-05-24 DIAGNOSIS — F4323 Adjustment disorder with mixed anxiety and depressed mood: Secondary | ICD-10-CM | POA: Diagnosis not present

## 2019-05-26 DIAGNOSIS — Z6820 Body mass index (BMI) 20.0-20.9, adult: Secondary | ICD-10-CM | POA: Diagnosis not present

## 2019-05-26 DIAGNOSIS — R6882 Decreased libido: Secondary | ICD-10-CM | POA: Diagnosis not present

## 2019-05-26 DIAGNOSIS — Z01419 Encounter for gynecological examination (general) (routine) without abnormal findings: Secondary | ICD-10-CM | POA: Diagnosis not present

## 2019-05-26 DIAGNOSIS — Z13820 Encounter for screening for osteoporosis: Secondary | ICD-10-CM | POA: Diagnosis not present

## 2019-05-29 ENCOUNTER — Other Ambulatory Visit: Payer: Self-pay | Admitting: Internal Medicine

## 2019-06-02 DIAGNOSIS — M5411 Radiculopathy, occipito-atlanto-axial region: Secondary | ICD-10-CM | POA: Diagnosis not present

## 2019-06-02 DIAGNOSIS — M9901 Segmental and somatic dysfunction of cervical region: Secondary | ICD-10-CM | POA: Diagnosis not present

## 2019-06-02 DIAGNOSIS — M9903 Segmental and somatic dysfunction of lumbar region: Secondary | ICD-10-CM | POA: Diagnosis not present

## 2019-06-07 DIAGNOSIS — F4323 Adjustment disorder with mixed anxiety and depressed mood: Secondary | ICD-10-CM | POA: Diagnosis not present

## 2019-06-20 DIAGNOSIS — H524 Presbyopia: Secondary | ICD-10-CM | POA: Diagnosis not present

## 2019-07-14 ENCOUNTER — Other Ambulatory Visit: Payer: Self-pay | Admitting: Internal Medicine

## 2019-08-05 ENCOUNTER — Ambulatory Visit: Payer: BC Managed Care – PPO | Attending: Internal Medicine

## 2019-08-05 DIAGNOSIS — Z23 Encounter for immunization: Secondary | ICD-10-CM

## 2019-08-05 NOTE — Progress Notes (Signed)
   Covid-19 Vaccination Clinic  Name:  Movita Ohrt    MRN: [place]:[id number] DOB: June 29, 1955  08/05/2019  Ms. Larin was observed post Covid-19 immunization for 15 minutes without incident. She was provided with Vaccine Information Sheet and instruction to access the V-Safe system.   Ms. Pless was instructed to call 911 with any severe reactions post vaccine: [name] Difficulty breathing  . Swelling of face and throat  . A fast heartbeat  . A bad rash all over body  . Dizziness and weakness   Immunizations Administered    Name Date Dose VIS Date Route   Pfizer COVID-19 Vaccine 08/05/2019 12:38 PM 0.3 mL 04/29/2019 Intramuscular   Manufacturer: [place]   Lot: [id number]   [place]: [id number]

## 2019-08-11 ENCOUNTER — Other Ambulatory Visit: Payer: Self-pay

## 2019-08-11 ENCOUNTER — Encounter: Payer: Self-pay | Admitting: Internal Medicine

## 2019-08-11 ENCOUNTER — Ambulatory Visit: Payer: BC Managed Care – PPO | Admitting: Internal Medicine

## 2019-08-11 DIAGNOSIS — E559 Vitamin D deficiency, unspecified: Secondary | ICD-10-CM

## 2019-08-11 DIAGNOSIS — M1991 Primary osteoarthritis, unspecified site: Secondary | ICD-10-CM | POA: Diagnosis not present

## 2019-08-11 DIAGNOSIS — M25561 Pain in right knee: Secondary | ICD-10-CM | POA: Diagnosis not present

## 2019-08-11 DIAGNOSIS — M5442 Lumbago with sciatica, left side: Secondary | ICD-10-CM

## 2019-08-11 DIAGNOSIS — M25562 Pain in left knee: Secondary | ICD-10-CM

## 2019-08-11 DIAGNOSIS — M5441 Lumbago with sciatica, right side: Secondary | ICD-10-CM

## 2019-08-11 DIAGNOSIS — G8929 Other chronic pain: Secondary | ICD-10-CM

## 2019-08-11 DIAGNOSIS — F5101 Primary insomnia: Secondary | ICD-10-CM

## 2019-08-11 MED ORDER — TRAMADOL HCL 50 MG PO TABS: 50.0000 mg | ORAL_TABLET | Freq: Two times a day (BID) | ORAL | 2 refills | Status: DC | PRN

## 2019-08-11 MED ORDER — CYCLOBENZAPRINE HCL 10 MG PO TABS: 10.0000 mg | ORAL_TABLET | Freq: Every evening | ORAL | 2 refills | Status: DC | PRN

## 2019-08-11 MED ORDER — ZALEPLON 10 MG PO CAPS: 10.0000 mg | ORAL_CAPSULE | Freq: Every evening | ORAL | 1 refills | Status: DC | PRN

## 2019-08-11 MED ORDER — HYDROCODONE-ACETAMINOPHEN 5-325 MG PO TABS: 0.5000 | ORAL_TABLET | Freq: Four times a day (QID) | ORAL | 0 refills | Status: DC | PRN

## 2019-08-11 NOTE — Assessment & Plan Note (Signed)
Vit D 

## 2019-08-11 NOTE — Patient Instructions (Addendum)
Try trekking poles  CBD oil cream

## 2019-08-11 NOTE — Assessment & Plan Note (Signed)
Pt had stem cell inj treatment in [place]

## 2019-08-11 NOTE — Progress Notes (Signed)
Subjective:  Patient ID: Rebecca Marks, female    DOB: [birth year]  Age: 64 y.o. MRN: [id number]  CC: No chief complaint on file.   HPI Rebecca Marks presents for LBP  Pt had stem cell treatment in [place] recently and had stem cell injection in her lumbar spine, fingers.  She thinks that her low back pain is little better and that her finger joints feel little better to  Outpatient Medications Prior to Visit  Medication Sig Dispense Refill  . Ascorbic Acid (VITAMIN C) 1000 MG tablet Take 2,000 mg by mouth 2 (two) times daily.     . Cholecalciferol (VITAMIN D3) 2000 UNITS TABS Take 2 tablets by mouth daily.    . Coenzyme Q10 200 MG capsule Take 200 mg by mouth daily.    [name] CORAL CALCIUM PO Take by mouth. 1000mg -3000mg  daily.    . cyclobenzaprine (FLEXERIL) 10 MG tablet TAKE 1 TABLET BY MOUTH AT BEDTIME AS NEEDED 30 tablet 2  . doxepin (SINEQUAN) 10 MG capsule TAKE 1 CAPSULE BY MOUTH EVERY DAY AT BEDTIME AS NEEDED 90 capsule 1  . Folic Acid-Vit Q000111Q 123456 0.4-50-0.1 MG TABS Take by mouth.    . gabapentin (NEURONTIN) 100 MG capsule 100mg  at 10a, 2pm, 6pm 360 capsule 2  . HYDROcodone-acetaminophen (NORCO/VICODIN) 5-325 MG tablet Take 0.5-1 tablets by mouth every 6 (six) hours as needed for severe pain. Do not take with Tramadol 100 tablet 0  . ibuprofen (ADVIL,MOTRIN) 800 MG tablet Take 800 mg by mouth at bedtime as needed.    . Nutritional Supplements (JOINT FORMULA PO) Take by mouth.    . Omega-3 Fatty Acids (ULTRA OMEGA 3 PO) Take by mouth. 1-2 grams daily.    [name] PHOSPHATIDYLSERINE PO Take 300 mg by mouth 2 (two) times a day.     . Riboflavin (B2 PO) Take 400 mg by mouth daily.    [name] selenium 50 MCG TABS tablet Take 200 mcg by mouth daily.    . traMADol (ULTRAM) 50 MG tablet Take 1-2 tablets (50-100 mg total) by mouth 2 (two) times daily as needed for moderate pain. for pain 100 tablet 2  . VOLTAREN 1 % GEL Apply 1 application topically at bedtime. Applied to knee, hand, fingers.     . zaleplon (SONATA) 10 MG capsule Take 1 capsule (10 mg total) by mouth at bedtime as needed for sleep. 90 capsule 1  . ciprofloxacin (CIPRO) 250 MG tablet Take 1 tablet (250 mg total) by mouth 2 (two) times daily. 6 tablet 0   No facility-administered medications prior to visit.    ROS: Review of Systems  Constitutional: Negative for activity change, appetite change, chills, fatigue and unexpected weight change.  HENT: Negative for congestion, mouth sores and sinus pressure.   Eyes: Negative for visual disturbance.  Respiratory: Negative for cough and chest tightness.   Gastrointestinal: Negative for abdominal pain and nausea.  Genitourinary: Negative for difficulty urinating, frequency and vaginal pain.  Musculoskeletal: Positive for arthralgias, back pain, gait problem and neck pain.  Skin: Negative for pallor and rash.  Neurological: Negative for dizziness, tremors, weakness, numbness and headaches.  Psychiatric/Behavioral: Negative for confusion and sleep disturbance.    Objective:  BP 110/72 (BP Location: Left Arm, Patient Position: Sitting, Cuff Size: Normal)   Pulse 92   Temp 98.5 F (36.9 C) (Oral)   Ht 5' 6.75" (1.695 m)   Wt 131 lb (59.4 kg)   SpO2 95%   BMI 20.67 kg/m   BP Readings from  Last 3 Encounters:  08/11/19 110/72  11/11/18 108/72  07/16/18 104/66    Wt Readings from Last 3 Encounters:  08/11/19 131 lb (59.4 kg)  11/11/18 129 lb (58.5 kg)  10/13/18 126 lb 5 oz (57.3 kg)    Physical Exam Constitutional:      General: She is not in acute distress.    Appearance: She is well-developed.  HENT:     Head: Normocephalic.     Right Ear: External ear normal.     Left Ear: External ear normal.     Nose: Nose normal.  Eyes:     General:        Right eye: No discharge.        Left eye: No discharge.     Conjunctiva/sclera: Conjunctivae normal.     Pupils: Pupils are equal, round, and reactive to light.  Neck:     Thyroid: No thyromegaly.      Vascular: No JVD.     Trachea: No tracheal deviation.  Cardiovascular:     Rate and Rhythm: Normal rate and regular rhythm.     Heart sounds: Normal heart sounds.  Pulmonary:     Effort: No respiratory distress.     Breath sounds: No stridor. No wheezing.  Abdominal:     General: Bowel sounds are normal. There is no distension.     Palpations: Abdomen is soft. There is no mass.     Tenderness: There is no abdominal tenderness. There is no guarding or rebound.  Musculoskeletal:        General: Swelling, tenderness and deformity present.     Cervical back: Normal range of motion and neck supple.  Lymphadenopathy:     Cervical: No cervical adenopathy.  Skin:    Findings: No erythema or rash.  Neurological:     Cranial Nerves: No cranial nerve deficit.     Motor: No abnormal muscle tone.     Coordination: Coordination normal.     Deep Tendon Reflexes: Reflexes normal.  Psychiatric:        Behavior: Behavior normal.        Thought Content: Thought content normal.        Judgment: Judgment normal.    Lumbar spine is tender to palpation with range of motion Several DIP joints on both hands are swollen and deformed especially on both index fingers  Lab Results  Component Value Date   WBC 9.1 04/21/2018   HGB 15.0 04/21/2018   HCT 42.1 04/21/2018   PLT 286.0 04/21/2018   GLUCOSE 91 07/08/2018   CHOL 164 04/21/2018   TRIG 129.0 04/21/2018   HDL 48.70 04/21/2018   LDLCALC 90 04/21/2018   ALT 7 04/21/2018   AST 17 04/21/2018   NA 140 07/08/2018   K 3.8 07/08/2018   CL 102 07/08/2018   CREATININE 0.82 07/08/2018   BUN 18 07/08/2018   CO2 30 07/08/2018   TSH 2.04 04/21/2018   INR 1.0 09/12/2015   HGBA1C 4.4 (L) 07/30/2006    MR LUMBAR SPINE WO CONTRAST  Result Date: 07/29/2018 CLINICAL DATA:  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 Hyper reflexia EXAM: MRI LUMBAR SPINE WITHOUT CONTRAST TECHNIQUE: Multiplanar, multisequence MR imaging of the lumbar  spine was performed. No intravenous contrast was administered. COMPARISON:  Lumbar radiographs 08/26/2017 FINDINGS: Segmentation:  Normal Alignment:  5 mm anterolisthesis L4-5.  Remaining alignment normal. Vertebrae:  Normal bone marrow.  Negative for fracture or mass. Conus medullaris and cauda equina: Conus extends to the  T12-L1 level. Conus and cauda equina appear normal. Paraspinal and other soft tissues: Negative for paraspinous mass or edema Disc levels: L1-2: Mild disc and mild facet degeneration.  Negative L2-3: Mild facet degeneration.  Negative for stenosis L3-4: Mild disc bulging. Small extraforaminal disc protrusion on the left. Bilateral facet degeneration. Negative for stenosis. L4-5: 5 mm anterolisthesis with severe facet degeneration. Moderate spinal stenosis. Moderate subarticular stenosis bilaterally. L5-S1: Bilateral facet degeneration.  No significant stenosis. IMPRESSION: 5 mm anterolisthesis L4-5 with severe facet degeneration. Moderate spinal stenosis and moderate subarticular stenosis bilaterally Mild degenerative changes throughout the remainder of the lumbar spine as detailed above. Electronically Signed   By: Franchot Gallo M.D.   On: 07/29/2018 15:16    Assessment & Plan:    Walker Kehr, MD

## 2019-08-14 ENCOUNTER — Encounter: Payer: Self-pay | Admitting: Internal Medicine

## 2019-08-14 NOTE — Assessment & Plan Note (Signed)
Zaleplon as needed  Potential benefits of a long term benzodiazepines  use as well as potential risks  and complications were explained to the patient and were aknowledged.

## 2019-08-14 NOTE — Assessment & Plan Note (Addendum)
Try trekking poles CBD oil cream Pt had stem cell treatment in [place] recently and had stem cell injection in her lumbar spine, fingers.  She thinks that her low back pain is little better and that her finger joints feel little better to Rheumatology consultation was offered

## 2019-08-30 ENCOUNTER — Ambulatory Visit: Payer: BC Managed Care – PPO | Attending: Internal Medicine

## 2019-08-30 DIAGNOSIS — Z23 Encounter for immunization: Secondary | ICD-10-CM

## 2019-08-30 NOTE — Progress Notes (Signed)
   Covid-19 Vaccination Clinic  Name:  Rebecca Marks    MRN: [place]:[id number] DOB: 1956-02-16  08/30/2019  [name] was observed post Covid-19 immunization for 15 minutes without incident. She was provided with Vaccine Information Sheet and instruction to access the V-Safe system.   [name] was instructed to call 911 with any severe reactions post vaccine: [name] Difficulty breathing  . Swelling of face and throat  . A fast heartbeat  . A bad rash all over body  . Dizziness and weakness   Immunizations Administered    Name Date Dose VIS Date Route   Pfizer COVID-19 Vaccine 08/30/2019  1:00 PM 0.3 mL 04/29/2019 Intramuscular   Manufacturer: [place]   Lot: [id number]   [place]: [id number]

## 2019-09-07 NOTE — Progress Notes (Signed)
[name] [place] Phone: [phone] Subjective:   Rebecca Marks, am serving as a scribe for Dr. Hulan Saas.  I'm seeing this patient by the request  of:  Plotnikov, Evie Lacks, MD  CC: f/u pes anserine   QA:[id number]   10/15/2017 Home exercises given.  Discussed hamstring strength.  Discussed eccentric's.  Discussed padding the area when kneeling for a long amount of time.  Patient will avoid certain activities.  Follow-up again in 4 to 8 weeks  Update 09/08/2019 Rebecca Marks is a 64 y.o. female coming in with complaint of pes anserine bursitis. Patient states wanting to follow up on her R leg she had an x-ray done in [place] and it showed that her R femur is tilting outward and it is making it painful to walk.  Patient did have multiple different type of injections including stem cells in the knee.  States that the right knee continues to give her pain left knee seems to be doing better.     Past Medical History:  Diagnosis Date  . Allergy   . Anxiety   . Arthritis    R wrist and R knee  . CAP (community acquired pneumonia) 4/17-22/15   Rochephin & IV Zithromax & 3 days Levaquin  . Chronic back pain   . DVT (deep venous thrombosis) ([place])    a. in her 20s while on OCPs.  . Hepatitis C    contaminated blood @ her job, prior notes indicate spontaneously cleared  . History of tobacco abuse   . Osteoarthritis   . Palpitations   . Premature atrial contractions    a. Holter 11/2013: few PACs.  . Pulmonary function study abnormality    a. 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  . Raynaud phenomenon   . Ruptured lumbar disc    4 herniated disc   . Volume overload    a.  history of volume overload during hospital stay for PNA in 08/2013 felt iatrogenic due to receiving a lot of IV fluid. 2D echo 08/2013 showed EF 123456, normal diastolic function parameters.   Past Surgical History:    Procedure Laterality Date  . CHOLECYSTECTOMY  1985  . lumbar stem cell   2020   Social History   Socioeconomic History  . Marital status: Married    Spouse name: Not on file  . Number of children: Not on file  . Years of education: Not on file  . Highest education level: Not on file  Occupational History  . Not on file  Tobacco Use  . Smoking status: Former Smoker    Packs/day: 0.25    Years: 25.00    Pack years: 6.25    Types: Cigarettes    Quit date: 08/19/2013    Years since quitting: 6.0  . Smokeless tobacco: Never Used  . Tobacco comment: smoked 1979-2008, up to 1 ppd. As of 08/01/13 1 cig every 2 weeks  Substance and Sexual Activity  . Alcohol use: Yes    Alcohol/week: 0.0 standard drinks    Comment: 1 Beer nightly  . Drug use: No    Comment: History-long time ago  . Sexual activity: Yes    Birth control/protection: None  Other Topics Concern  . Not on file  Social History Narrative   She lives with husband. She does not have children.   She is a retired Marine scientist.    She lives in 3 story home.  Social Determinants of Health   Financial Resource Strain:   . Difficulty of Paying Living Expenses:   Food Insecurity:   . Worried About Charity fundraiser in the Last Year:   . Arboriculturist in the Last Year:   Transportation Needs:   . Film/video editor (Medical):   [name] Lack of Transportation (Non-Medical):   Physical Activity:   . Days of Exercise per Week:   . Minutes of Exercise per Session:   Stress:   . Feeling of Stress :   Social Connections:   . Frequency of Communication with Friends and Family:   . Frequency of Social Gatherings with Friends and Family:   . Attends Religious Services:   . Active Member of Clubs or Organizations:   . Attends Archivist Meetings:   [name] Marital Status:    Allergies  Allergen Reactions  . Ritalin [Methylphenidate Hcl]     Too hyper   Family History  Problem Relation Age of Onset  . Diabetes Father    . Hypertension Father   . Heart attack Father        >55  . Lymphoma Father   . Heart disease Father   . Stroke Mother 94       after bedridden post fall with BLE paralysis  . Breast cancer Mother         X 3  . Breast cancer Sister   . Alzheimer's disease Maternal Grandmother   . Stroke Maternal Grandfather        in 21s  . Colon cancer Neg Hx        Current Outpatient Medications (Analgesics):  .  HYDROcodone-acetaminophen (NORCO/VICODIN) 5-325 MG tablet, Take 0.5-1 tablets by mouth every 6 (six) hours as needed for severe pain. Do not take with Tramadol .  ibuprofen (ADVIL,MOTRIN) 800 MG tablet, Take 800 mg by mouth at bedtime as needed. .  traMADol (ULTRAM) 50 MG tablet, Take 1-2 tablets (50-100 mg total) by mouth 2 (two) times daily as needed for moderate pain. for pain  Current Outpatient Medications (Hematological):  [name]  Folic Acid-Vit [id number] 123456 0.4-50-0.1 MG TABS, Take by mouth.  Current Outpatient Medications (Other):  [name]  Ascorbic Acid (VITAMIN C) 1000 MG tablet, Take 2,000 mg by mouth 2 (two) times daily.  .  Cholecalciferol (VITAMIN D3) 2000 UNITS TABS, Take 2 tablets by mouth daily. .  Coenzyme Q10 200 MG capsule, Take 200 mg by mouth daily. [name]  CORAL CALCIUM PO, Take by mouth. 1000mg -3000mg  daily. .  cyclobenzaprine (FLEXERIL) 10 MG tablet, Take 1 tablet (10 mg total) by mouth at bedtime as needed. .  doxepin (SINEQUAN) 10 MG capsule, TAKE 1 CAPSULE BY MOUTH EVERY DAY AT BEDTIME AS NEEDED .  gabapentin (NEURONTIN) 100 MG capsule, 100mg  at 10a, 2pm, 6pm .  Nutritional Supplements (JOINT FORMULA PO), Take by mouth. .  Omega-3 Fatty Acids (ULTRA OMEGA 3 PO), Take by mouth. 1-2 grams daily. [name]  PHOSPHATIDYLSERINE PO, Take 300 mg by mouth 2 (two) times a day.  .  Riboflavin (B2 PO), Take 400 mg by mouth daily. [name]  selenium 50 MCG TABS tablet, Take 200 mcg by mouth daily. .  VOLTAREN 1 % GEL, Apply 1 application topically at bedtime. Applied to knee, hand, fingers. .   zaleplon (SONATA) 10 MG capsule, Take 1 capsule (10 mg total) by mouth at bedtime as needed for sleep.   Reviewed prior external information including notes and imaging from  primary  care provider As well as notes that were available from care everywhere and other healthcare systems.  Past medical history, social, surgical and family history all reviewed in electronic medical record.  No pertanent information unless stated regarding to the chief complaint.   Review of Systems:  No headache, visual changes, nausea, vomiting, diarrhea, constipation, dizziness, abdominal pain, skin rash, fevers, chills, night sweats, weight loss, swollen lymph nodes, body aches, joint swelling, chest pain, shortness of breath, mood changes. POSITIVE muscle aches  Objective  Blood pressure 118/82, pulse 98, height 5' 6.75" (1.695 m), weight 131 lb 1.6 oz (59.5 kg), SpO2 97 %.   General: No apparent distress alert and oriented x3 mood and affect normal, dressed appropriately.  HEENT: Pupils equal, extraocular movements intact  Respiratory: Patient's speak in full sentences and does not appear short of breath  Cardiovascular: No lower extremity edema, non tender, no erythema  Neuro: Cranial nerves II through XII are intact, neurovascularly intact in all extremities with 2+ DTRs and 2+ pulses.  Gait normal with good balance and coordination.  MSK:  tender with full range of motion and good stability and symmetric strength and tone of shoulders, elbows, wrist, hip, and ankles bilaterally.  Right knee does have patella grind noted.  Lateral tracking of the patella.  No significant instability but does have some moderate arthritic changes of the medial and lateral joint space.  Lacks last 2 degrees of extension.  [id number]; 15 additional minutes spent for Therapeutic exercises as stated in above notes.  This included exercises focusing on stretching, strengthening, with significant focus on eccentric aspects.   Long term  goals include an improvement in range of motion, strength, endurance as well as avoiding reinjury. Patient's frequency would include in 1-2 times a day, 3-5 times a week for a duration of 6-12 weeks. Reviewed anatomy using anatomical model and how PFS occurs.  Given rehab exercises handout for VMO, hip abductors, core, entire kinetic chain including proprioception exercises.  Could benefit from PT, regular exercise, upright biking, and a PFS knee brace to assist with tracking abnormalities.  Proper technique shown and discussed handout in great detail with ATC.  All questions were discussed and answered.      Impression and Recommendations:     This case required medical decision making of moderate complexity. The above documentation has been reviewed and is accurate and complete Lyndal Pulley, DO       Note: This dictation was prepared with Dragon dictation along with smaller phrase technology. Any transcriptional errors that result from this process are unintentional.

## 2019-09-08 ENCOUNTER — Encounter: Payer: Self-pay | Admitting: Family Medicine

## 2019-09-08 ENCOUNTER — Other Ambulatory Visit: Payer: Self-pay

## 2019-09-08 ENCOUNTER — Ambulatory Visit: Payer: BC Managed Care – PPO | Admitting: Family Medicine

## 2019-09-08 DIAGNOSIS — M1711 Unilateral primary osteoarthritis, right knee: Secondary | ICD-10-CM

## 2019-09-08 NOTE — Patient Instructions (Signed)
Good to see you.  Ice 20 minutes 2 times daily. Usually after activity and before bed. Exercises 3 times a week.  pennsaid pinkie amount topically 2 times daily as needed.  Turmeric 500mg  daily  Tart cherry extract 1200mg  at night Vitamin D 2000 IU daily  Tru pull lite with activity See me again in 6 weeks

## 2019-09-09 ENCOUNTER — Encounter: Payer: Self-pay | Admitting: Family Medicine

## 2019-09-09 DIAGNOSIS — M1711 Unilateral primary osteoarthritis, right knee: Secondary | ICD-10-CM | POA: Insufficient documentation

## 2019-09-09 NOTE — Assessment & Plan Note (Signed)
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  We discussed bracing and given a Tru pull lite today.  Discussed avoiding the narcotics if possible.  Patient has had back pain and has been doing gabapentin.  Home exercises given for vastus medialis oblique strengthening.  Patient declined formal physical therapy.  May be a candidate for PRP and patient will consider that but hopefully just with the bracing it will be helpful.  Follow-up again in 6 weeks

## 2019-10-06 ENCOUNTER — Telehealth: Payer: Self-pay | Admitting: Internal Medicine

## 2019-10-06 NOTE — Telephone Encounter (Signed)
New message:    Pt is calling and would like some [id number] - [place], [place] [address]. She states she can't go through one more night like this. Please advise.

## 2019-10-06 NOTE — Telephone Encounter (Signed)
   Patient calling to request antibiotic be called in for a UTI. 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 Patient states if needed she would go to the [place] location to provide specimen

## 2019-10-07 ENCOUNTER — Other Ambulatory Visit: Payer: Self-pay | Admitting: Family

## 2019-10-07 MED ORDER — SULFAMETHOXAZOLE-TRIMETHOPRIM 800-160 MG PO TABS: 1.0000 | ORAL_TABLET | Freq: Two times a day (BID) | ORAL | 0 refills | Status: DC

## 2019-10-07 NOTE — Telephone Encounter (Signed)
Spoke with patient and info given 

## 2019-10-07 NOTE — Telephone Encounter (Signed)
Please advise in PCP's absence. Thanks! 

## 2019-10-07 NOTE — Telephone Encounter (Signed)
Please let her know that Dr. Alain Marion had a family emergency yesterday. That is why she didn't hear back from him yesterday. I will treat for her this time because our office didn't follow-up with her yesterday but in general, she should make an appointment when she thinks she has a UTI.

## 2019-10-20 ENCOUNTER — Ambulatory Visit: Payer: BC Managed Care – PPO | Admitting: Family Medicine

## 2019-11-01 ENCOUNTER — Other Ambulatory Visit (INDEPENDENT_AMBULATORY_CARE_PROVIDER_SITE_OTHER): Payer: BC Managed Care – PPO

## 2019-11-01 DIAGNOSIS — R5382 Chronic fatigue, unspecified: Secondary | ICD-10-CM | POA: Diagnosis not present

## 2019-11-01 LAB — CORTISOL: Cortisol, Plasma: 16.8 ug/dL

## 2019-11-03 ENCOUNTER — Other Ambulatory Visit: Payer: Self-pay

## 2019-11-03 ENCOUNTER — Encounter: Payer: Self-pay | Admitting: Internal Medicine

## 2019-11-03 ENCOUNTER — Ambulatory Visit: Payer: BC Managed Care – PPO | Admitting: Internal Medicine

## 2019-11-03 DIAGNOSIS — F5101 Primary insomnia: Secondary | ICD-10-CM | POA: Diagnosis not present

## 2019-11-03 DIAGNOSIS — M5442 Lumbago with sciatica, left side: Secondary | ICD-10-CM | POA: Diagnosis not present

## 2019-11-03 DIAGNOSIS — G8929 Other chronic pain: Secondary | ICD-10-CM

## 2019-11-03 DIAGNOSIS — M25561 Pain in right knee: Secondary | ICD-10-CM | POA: Diagnosis not present

## 2019-11-03 DIAGNOSIS — M25562 Pain in left knee: Secondary | ICD-10-CM | POA: Diagnosis not present

## 2019-11-03 DIAGNOSIS — M5441 Lumbago with sciatica, right side: Secondary | ICD-10-CM

## 2019-11-03 MED ORDER — SULFAMETHOXAZOLE-TRIMETHOPRIM 800-160 MG PO TABS: 1.0000 | ORAL_TABLET | Freq: Two times a day (BID) | ORAL | 0 refills | Status: DC

## 2019-11-03 MED ORDER — TRAMADOL HCL 50 MG PO TABS: 50.0000 mg | ORAL_TABLET | Freq: Two times a day (BID) | ORAL | 2 refills | Status: DC | PRN

## 2019-11-03 MED ORDER — FLUCONAZOLE 150 MG PO TABS: 150.0000 mg | ORAL_TABLET | Freq: Once | ORAL | 1 refills | Status: AC

## 2019-11-03 MED ORDER — METHYLPREDNISOLONE 4 MG PO TBPK: ORAL_TABLET | ORAL | 0 refills | Status: DC

## 2019-11-03 MED ORDER — HYDROCODONE-ACETAMINOPHEN 5-325 MG PO TABS: 0.5000 | ORAL_TABLET | Freq: Four times a day (QID) | ORAL | 0 refills | Status: DC | PRN

## 2019-11-03 NOTE — Assessment & Plan Note (Signed)
Worse Medrol pac Pt declined PT  LS MRI IMPRESSION: 5 mm anterolisthesis L4-5 with severe facet degeneration. Moderate spinal stenosis and moderate subarticular stenosis bilaterally  Mild degenerative changes throughout the remainder of the lumbar spine as detailed above.   Electronically Signed   By: Franchot Gallo M.D.   On: 07/29/2018 15:16

## 2019-11-03 NOTE — Progress Notes (Signed)
Subjective:  Patient ID: Rebecca Marks, female    DOB: 1956/05/08  Age: 64 y.o. MRN: [id number]  CC: No chief complaint on file.   HPI Norah Dorin presents for severe LBP, B LE pain - worse over past year Stem cells helped LBP, LE pains are worse  LS MRI IMPRESSION: 5 mm anterolisthesis L4-5 with severe facet degeneration. Moderate spinal stenosis and moderate subarticular stenosis bilaterally  Mild degenerative changes throughout the remainder of the lumbar spine as detailed above.   Electronically Signed   By: Franchot Gallo M.D.   On: 07/29/2018 15:16   Outpatient Medications Prior to Visit  Medication Sig Dispense Refill  . Ascorbic Acid (VITAMIN C) 1000 MG tablet Take 2,000 mg by mouth 2 (two) times daily.     . Cholecalciferol (VITAMIN D3) 2000 UNITS TABS Take 2 tablets by mouth daily.    . Coenzyme Q10 200 MG capsule Take 200 mg by mouth daily.    [name] CORAL CALCIUM PO Take by mouth. 1000mg -3000mg  daily.    . cyclobenzaprine (FLEXERIL) 10 MG tablet Take 1 tablet (10 mg total) by mouth at bedtime as needed. 30 tablet 2  . doxepin (SINEQUAN) 10 MG capsule TAKE 1 CAPSULE BY MOUTH EVERY DAY AT BEDTIME AS NEEDED 90 capsule 1  . Folic Acid-Vit I1-WER X54 0.4-50-0.1 MG TABS Take by mouth.    . gabapentin (NEURONTIN) 100 MG capsule 100mg  at 10a, 2pm, 6pm 360 capsule 2  . HYDROcodone-acetaminophen (NORCO/VICODIN) 5-325 MG tablet Take 0.5-1 tablets by mouth every 6 (six) hours as needed for severe pain. Do not take with Tramadol 100 tablet 0  . ibuprofen (ADVIL,MOTRIN) 800 MG tablet Take 800 mg by mouth at bedtime as needed.    . Nutritional Supplements (JOINT FORMULA PO) Take by mouth.    . Omega-3 Fatty Acids (ULTRA OMEGA 3 PO) Take by mouth. 1-2 grams daily.    [name] PHOSPHATIDYLSERINE PO Take 300 mg by mouth 2 (two) times a day.     . Riboflavin (B2 PO) Take 400 mg by mouth daily.    [name] selenium 50 MCG TABS tablet Take 200 mcg by mouth daily.    . traMADol (ULTRAM)  50 MG tablet Take 1-2 tablets (50-100 mg total) by mouth 2 (two) times daily as needed for moderate pain. for pain 100 tablet 2  . VOLTAREN 1 % GEL Apply 1 application topically at bedtime. Applied to knee, hand, fingers.    . zaleplon (SONATA) 10 MG capsule Take 1 capsule (10 mg total) by mouth at bedtime as needed for sleep. 90 capsule 1  . sulfamethoxazole-trimethoprim (BACTRIM DS) 800-160 MG tablet Take 1 tablet by mouth 2 (two) times daily. 10 tablet 0   No facility-administered medications prior to visit.    ROS: Review of Systems  Constitutional: Negative for activity change, appetite change, chills, fatigue and unexpected weight change.  HENT: Negative for congestion, mouth sores and sinus pressure.   Eyes: Negative for visual disturbance.  Respiratory: Negative for cough and chest tightness.   Gastrointestinal: Negative for abdominal pain and nausea.  Genitourinary: Negative for difficulty urinating, frequency and vaginal pain.  Musculoskeletal: Positive for arthralgias and back pain. Negative for gait problem.  Skin: Negative for pallor and rash.  Neurological: Negative for dizziness, tremors, weakness, numbness and headaches.  Psychiatric/Behavioral: Positive for sleep disturbance. Negative for confusion.    Objective:  BP 116/68 (BP Location: Right Arm, Patient Position: Sitting, Cuff Size: Normal)   Pulse 88   Temp 98.6 F (  37 C) (Oral)   Ht 5' 6.75" (1.695 m)   Wt 127 lb (57.6 kg)   SpO2 98%   BMI 20.04 kg/m   BP Readings from Last 3 Encounters:  11/03/19 116/68  09/08/19 118/82  08/11/19 110/72    Wt Readings from Last 3 Encounters:  11/03/19 127 lb (57.6 kg)  09/08/19 131 lb 1.6 oz (59.5 kg)  08/11/19 131 lb (59.4 kg)    Physical Exam Constitutional:      General: She is not in acute distress.    Appearance: She is well-developed.  HENT:     Head: Normocephalic.     Right Ear: External ear normal.     Left Ear: External ear normal.     Nose: Nose  normal.  Eyes:     General:        Right eye: No discharge.        Left eye: No discharge.     Conjunctiva/sclera: Conjunctivae normal.     Pupils: Pupils are equal, round, and reactive to light.  Neck:     Thyroid: No thyromegaly.     Vascular: No JVD.     Trachea: No tracheal deviation.  Cardiovascular:     Rate and Rhythm: Normal rate and regular rhythm.     Heart sounds: Normal heart sounds.  Pulmonary:     Effort: No respiratory distress.     Breath sounds: No stridor. No wheezing.  Abdominal:     General: Bowel sounds are normal. There is no distension.     Palpations: Abdomen is soft. There is no mass.     Tenderness: There is no abdominal tenderness. There is no guarding or rebound.  Musculoskeletal:        General: No tenderness.     Cervical back: Normal range of motion and neck supple.  Lymphadenopathy:     Cervical: No cervical adenopathy.  Skin:    Findings: No erythema or rash.  Neurological:     Cranial Nerves: No cranial nerve deficit.     Motor: No abnormal muscle tone.     Coordination: Coordination normal.     Deep Tendon Reflexes: Reflexes normal.  Psychiatric:        Behavior: Behavior normal.        Thought Content: Thought content normal.        Judgment: Judgment normal.    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 Hyper reflexia EXAM: MRI LUMBAR  SPINE WITHOUT CONTRAST TECHNIQUE: Multiplanar, multisequence MR imaging of the lumbar spine was performed. No intravenous contrast was administered. COMPARISON:  Lumbar radiographs 08/26/2017 FINDINGS: Segmentation:  Normal Alignment:  5 mm anterolisthesis L4-5.  Remaining alignment normal. Vertebrae:  Normal bone marrow.  Negative for fracture or mass. Conus medullaris and  cauda equina: Conus extends to the T12-L1 level. Conus and cauda equina appear normal. Paraspinal and other soft tissues: Negative for paraspinous mass or edema Disc levels: L1-2: Mild disc and mild facet degeneration.  Negative L2-3: Mild facet degeneration.  Negative for stenosis L3-4: Mild disc bulging. Small extraforaminal disc protrusion on the left. Bilateral facet degeneration. Negative for stenosis. L4-5: 5 mm anterolisthesis with severe facet degeneration. Moderate spinal stenosis. Moderate subarticular stenosis bilaterally. L5-S1: Bilateral facet degeneration.  No significant stenosis. IMPRESSION: 5 mm anterolisthesis L4-5 with severe facet degeneration. Moderate spinal stenosis and moderate subarticular stenosis bilaterally Mild degenerative changes throughout the remainder of the lumbar spine as detailed above. Electronically Signed   By: Franchot Gallo M.D.   On: 07/29/2018 15:16    Assessment & Plan:    Walker Kehr, MD

## 2019-11-07 NOTE — Assessment & Plan Note (Signed)
Cont w/Zaleplon prn   Potential benefits of a long term benzodiazepines  use as well as potential risks  and complications were explained to the patient and were aknowledged.

## 2019-11-10 DIAGNOSIS — Z1231 Encounter for screening mammogram for malignant neoplasm of breast: Secondary | ICD-10-CM | POA: Diagnosis not present

## 2019-11-19 ENCOUNTER — Other Ambulatory Visit: Payer: Self-pay | Admitting: Internal Medicine

## 2019-12-04 ENCOUNTER — Other Ambulatory Visit: Payer: Self-pay | Admitting: Internal Medicine

## 2019-12-22 ENCOUNTER — Telehealth: Payer: Self-pay | Admitting: Internal Medicine

## 2019-12-22 NOTE — Telephone Encounter (Signed)
    Patient calling to report cloudy, foul smelling urine, painful and frequent Patient requesting urine culture be ordered.  Declined to schedule appointment at this time

## 2019-12-23 MED ORDER — CEFUROXIME AXETIL 250 MG PO TABS: 250.0000 mg | ORAL_TABLET | Freq: Two times a day (BID) | ORAL | 0 refills | Status: AC

## 2019-12-23 NOTE — Telephone Encounter (Signed)
Left pt a detailed message of below  

## 2019-12-23 NOTE — Telephone Encounter (Signed)
[facility]  Office visit if not better.  Thanks

## 2019-12-28 ENCOUNTER — Other Ambulatory Visit: Payer: Self-pay | Admitting: Internal Medicine

## 2019-12-28 DIAGNOSIS — M25561 Pain in right knee: Secondary | ICD-10-CM

## 2019-12-28 DIAGNOSIS — M25562 Pain in left knee: Secondary | ICD-10-CM

## 2019-12-28 MED ORDER — HYDROCODONE-ACETAMINOPHEN 5-325 MG PO TABS: 0.5000 | ORAL_TABLET | Freq: Four times a day (QID) | ORAL | 0 refills | Status: DC | PRN

## 2020-01-02 ENCOUNTER — Encounter: Payer: Self-pay | Admitting: Internal Medicine

## 2020-01-02 ENCOUNTER — Ambulatory Visit: Payer: BC Managed Care – PPO | Admitting: Internal Medicine

## 2020-01-02 ENCOUNTER — Other Ambulatory Visit: Payer: Self-pay

## 2020-01-02 DIAGNOSIS — F5101 Primary insomnia: Secondary | ICD-10-CM

## 2020-01-02 DIAGNOSIS — M5441 Lumbago with sciatica, right side: Secondary | ICD-10-CM | POA: Diagnosis not present

## 2020-01-02 DIAGNOSIS — G8929 Other chronic pain: Secondary | ICD-10-CM

## 2020-01-02 DIAGNOSIS — L309 Dermatitis, unspecified: Secondary | ICD-10-CM | POA: Diagnosis not present

## 2020-01-02 DIAGNOSIS — M5442 Lumbago with sciatica, left side: Secondary | ICD-10-CM | POA: Diagnosis not present

## 2020-01-02 MED ORDER — TRIAMCINOLONE ACETONIDE 0.1 % EX CREA
1.0000 | TOPICAL_CREAM | Freq: Three times a day (TID) | CUTANEOUS | 3 refills | Status: DC | PRN
Start: 2020-01-02 — End: 2020-03-02

## 2020-01-02 MED ORDER — TRAMADOL HCL 50 MG PO TABS: 50.0000 mg | ORAL_TABLET | Freq: Two times a day (BID) | ORAL | 2 refills | Status: DC | PRN

## 2020-01-02 MED ORDER — ZALEPLON 10 MG PO CAPS: 10.0000 mg | ORAL_CAPSULE | Freq: Every evening | ORAL | 1 refills | Status: DC | PRN

## 2020-01-02 NOTE — Assessment & Plan Note (Signed)
Tramadol low dose Norco  Potential benefits of a long term opioids use as well as potential risks (i.e. addiction risk, apnea etc) and complications (i.e. Somnolence, constipation and others) were explained to the patient and were aknowledged.  Very rarely Rebecca Marks needs to use half tablet of Norco as needed if tramadol does not help.

## 2020-01-02 NOTE — Assessment & Plan Note (Addendum)
Sebamed soap Triamc cream

## 2020-01-02 NOTE — Assessment & Plan Note (Signed)
Zaleplon prn 2019  Potential benefits of a long term benzodiazepines  use as well as potential risks  and complications were explained to the patient and were aknowledged.

## 2020-01-02 NOTE — Patient Instructions (Signed)
Sebamed soap 

## 2020-01-02 NOTE — Progress Notes (Signed)
Subjective:  Patient ID: Rebecca Marks, female    DOB: Aug 30, 1955  Age: 64 y.o. MRN: [id number]  CC: No chief complaint on file.   HPI Rebecca Marks presents for LBP, insomnia f/u C/o rash   Outpatient Medications Prior to Visit  Medication Sig Dispense Refill  . Ascorbic Acid (VITAMIN C) 1000 MG tablet Take 2,000 mg by mouth 2 (two) times daily.     . Cholecalciferol (VITAMIN D3) 2000 UNITS TABS Take 2 tablets by mouth daily.    . Coenzyme Q10 200 MG capsule Take 200 mg by mouth daily.    [name] CORAL CALCIUM PO Take by mouth. 1000mg -3000mg  daily.    . cyclobenzaprine (FLEXERIL) 10 MG tablet TAKE 1 TABLET (10 MG TOTAL) BY MOUTH AT BEDTIME AS NEEDED. 30 tablet 2  . doxepin (SINEQUAN) 10 MG capsule TAKE 1 CAPSULE BY MOUTH EVERY DAY AT BEDTIME AS NEEDED 90 capsule 1  . Folic Acid-Vit X5-MWU X32 0.4-50-0.1 MG TABS Take by mouth.    . gabapentin (NEURONTIN) 100 MG capsule 100mg  at 10a, 2pm, 6pm 360 capsule 2  . HYDROcodone-acetaminophen (NORCO/VICODIN) 5-325 MG tablet Take 0.5-1 tablets by mouth every 6 (six) hours as needed for severe pain. Do not take with Tramadol 100 tablet 0  . ibuprofen (ADVIL,MOTRIN) 800 MG tablet Take 800 mg by mouth at bedtime as needed.    . methylPREDNISolone (MEDROL DOSEPAK) 4 MG TBPK tablet As directed 21 tablet 0  . Nutritional Supplements (JOINT FORMULA PO) Take by mouth.    . Omega-3 Fatty Acids (ULTRA OMEGA 3 PO) Take by mouth. 1-2 grams daily.    [name] PHOSPHATIDYLSERINE PO Take 300 mg by mouth 2 (two) times a day.     . Riboflavin (B2 PO) Take 400 mg by mouth daily.    [name] selenium 50 MCG TABS tablet Take 200 mcg by mouth daily.    [name] sulfamethoxazole-trimethoprim (BACTRIM DS) 800-160 MG tablet Take 1 tablet by mouth 2 (two) times daily. 10 tablet 0  . traMADol (ULTRAM) 50 MG tablet Take 1-2 tablets (50-100 mg total) by mouth 2 (two) times daily as needed for moderate pain. for pain 100 tablet 2  . VOLTAREN 1 % GEL Apply 1 application topically at  bedtime. Applied to knee, hand, fingers.    . zaleplon (SONATA) 10 MG capsule Take 1 capsule (10 mg total) by mouth at bedtime as needed for sleep. 90 capsule 1   No facility-administered medications prior to visit.    ROS: Review of Systems  Constitutional: Negative for activity change, appetite change, chills, fatigue and unexpected weight change.  HENT: Negative for congestion, mouth sores and sinus pressure.   Eyes: Negative for visual disturbance.  Respiratory: Negative for cough and chest tightness.   Gastrointestinal: Negative for abdominal pain and nausea.  Genitourinary: Negative for difficulty urinating, frequency and vaginal pain.  Musculoskeletal: Negative for back pain and gait problem.  Skin: Negative for pallor and rash.  Neurological: Negative for dizziness, tremors, weakness, numbness and headaches.  Psychiatric/Behavioral: Negative for confusion and sleep disturbance.    Objective:  BP 110/80 (BP Location: Left Arm, Patient Position: Sitting, Cuff Size: Normal)   Pulse 80   Temp 98.7 F (37.1 C) (Oral)   Ht 5' 5.75" (1.67 m)   Wt 122 lb (55.3 kg)   SpO2 97%   BMI 19.84 kg/m   BP Readings from Last 3 Encounters:  01/02/20 110/80  11/03/19 116/68  09/08/19 118/82    Wt Readings from Last 3 Encounters:  01/02/20 122 lb (55.3 kg)  11/03/19 127 lb (57.6 kg)  09/08/19 131 lb 1.6 oz (59.5 kg)    Physical Exam Constitutional:      General: She is not in acute distress.    Appearance: She is well-developed.  HENT:     Head: Normocephalic.     Right Ear: External ear normal.     Left Ear: External ear normal.     Nose: Nose normal.  Eyes:     General:        Right eye: No discharge.        Left eye: No discharge.     Conjunctiva/sclera: Conjunctivae normal.     Pupils: Pupils are equal, round, and reactive to light.  Neck:     Thyroid: No thyromegaly.     Vascular: No JVD.     Trachea: No tracheal deviation.  Cardiovascular:     Rate and Rhythm:  Normal rate and regular rhythm.     Heart sounds: Normal heart sounds.  Pulmonary:     Effort: No respiratory distress.     Breath sounds: No stridor. No wheezing.  Abdominal:     General: Bowel sounds are normal. There is no distension.     Palpations: Abdomen is soft. There is no mass.     Tenderness: There is no abdominal tenderness. There is no guarding or rebound.  Musculoskeletal:        General: No tenderness.     Cervical back: Normal range of motion and neck supple.  Lymphadenopathy:     Cervical: No cervical adenopathy.  Skin:    Findings: No erythema or rash.  Neurological:     Mental Status: She is oriented to person, place, and time.     Cranial Nerves: No cranial nerve deficit.     Motor: No abnormal muscle tone.     Coordination: Coordination normal.     Deep Tendon Reflexes: Reflexes normal.  Psychiatric:        Behavior: Behavior normal.        Thought Content: Thought content normal.        Judgment: Judgment normal.     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 Hyper reflexia EXAM: MRI LUMBAR SPINE WITHOUT CONTRAST TECHNIQUE: Multiplanar, multisequence MR imaging of the lumbar spine was performed. No intravenous contrast was administered. COMPARISON:  Lumbar radiographs 08/26/2017 FINDINGS: Segmentation:  Normal Alignment:  5 mm anterolisthesis L4-5.  Remaining alignment normal.  Vertebrae:  Normal bone marrow.  Negative for fracture or mass. Conus medullaris and cauda equina: Conus extends to the T12-L1 level. Conus and cauda equina appear normal. Paraspinal and other soft tissues: Negative for paraspinous mass or edema Disc levels: L1-2: Mild disc and mild facet degeneration.  Negative L2-3: Mild facet degeneration.  Negative  for stenosis L3-4: Mild disc bulging. Small extraforaminal disc protrusion on the left. Bilateral facet degeneration. Negative for stenosis. L4-5: 5 mm anterolisthesis with severe facet degeneration. Moderate spinal stenosis. Moderate subarticular stenosis bilaterally. L5-S1: Bilateral facet degeneration.  No significant stenosis. IMPRESSION: 5 mm anterolisthesis L4-5 with severe facet degeneration. Moderate spinal stenosis and moderate subarticular stenosis bilaterally Mild degenerative changes throughout the remainder of the lumbar spine as detailed above. Electronically Signed   By: Franchot Gallo M.D.   On: 07/29/2018 15:16    Assessment & Plan:    Walker Kehr, MD

## 2020-01-29 ENCOUNTER — Other Ambulatory Visit: Payer: Self-pay | Admitting: Internal Medicine

## 2020-01-29 DIAGNOSIS — R300 Dysuria: Secondary | ICD-10-CM

## 2020-01-30 ENCOUNTER — Other Ambulatory Visit: Payer: Self-pay | Admitting: Internal Medicine

## 2020-01-31 ENCOUNTER — Other Ambulatory Visit: Payer: Self-pay | Admitting: Internal Medicine

## 2020-01-31 MED ORDER — TRAMADOL HCL 50 MG PO TABS: 50.0000 mg | ORAL_TABLET | Freq: Two times a day (BID) | ORAL | 2 refills | Status: DC | PRN

## 2020-01-31 MED ORDER — CYCLOBENZAPRINE HCL 10 MG PO TABS
10.0000 mg | ORAL_TABLET | Freq: Every evening | ORAL | 2 refills | Status: DC | PRN
Start: 2020-01-31 — End: 2020-04-26

## 2020-02-22 ENCOUNTER — Other Ambulatory Visit: Payer: Self-pay | Admitting: Neurology

## 2020-02-22 ENCOUNTER — Other Ambulatory Visit: Payer: Self-pay | Admitting: Internal Medicine

## 2020-02-22 DIAGNOSIS — M25561 Pain in right knee: Secondary | ICD-10-CM

## 2020-02-22 MED ORDER — HYDROCODONE-ACETAMINOPHEN 5-325 MG PO TABS: 0.5000 | ORAL_TABLET | Freq: Four times a day (QID) | ORAL | 0 refills | Status: DC | PRN

## 2020-02-22 NOTE — Telephone Encounter (Signed)
Patient needs to schedule appointment prior to additional refills since it has been > 1 year since she was last seen or request gabapentin through her PCP.

## 2020-03-02 ENCOUNTER — Other Ambulatory Visit: Payer: Self-pay

## 2020-03-02 ENCOUNTER — Encounter: Payer: Self-pay | Admitting: Internal Medicine

## 2020-03-02 ENCOUNTER — Ambulatory Visit: Payer: BC Managed Care – PPO | Admitting: Internal Medicine

## 2020-03-02 DIAGNOSIS — S60221A Contusion of right hand, initial encounter: Secondary | ICD-10-CM

## 2020-03-02 DIAGNOSIS — M5442 Lumbago with sciatica, left side: Secondary | ICD-10-CM

## 2020-03-02 DIAGNOSIS — M5441 Lumbago with sciatica, right side: Secondary | ICD-10-CM

## 2020-03-02 DIAGNOSIS — W5503XA Scratched by cat, initial encounter: Secondary | ICD-10-CM

## 2020-03-02 DIAGNOSIS — F419 Anxiety disorder, unspecified: Secondary | ICD-10-CM | POA: Diagnosis not present

## 2020-03-02 DIAGNOSIS — G8929 Other chronic pain: Secondary | ICD-10-CM | POA: Diagnosis not present

## 2020-03-02 NOTE — Patient Instructions (Signed)
Your hematoma will continue to heal overe the next 2-3 weeks  Please continue all other medications as before  Please have the pharmacy call with any other refills you may need.  Please continue your efforts at being more active, low cholesterol diet, and weight control.  Please keep your appointments with your specialists as you may have planned

## 2020-03-04 ENCOUNTER — Encounter: Payer: Self-pay | Admitting: Internal Medicine

## 2020-03-04 DIAGNOSIS — W5503XA Scratched by cat, initial encounter: Secondary | ICD-10-CM | POA: Insufficient documentation

## 2020-03-04 NOTE — Assessment & Plan Note (Addendum)
With resolving benign hematoma, no need for specific tx and no s/s infection,  to f/u any worsening symptoms or concerns  I spent 31 minutes in preparing to see the patient by review of recent labs, imaging and procedures, obtaining and reviewing separately obtained history, communicating with the patient and family or caregiver, ordering medications, tests or procedures, and documenting clinical information in the EHR including the differential Dx, treatment, and any further evaluation and other management of cat scratch with hematoma, chronic lbp, anxiety

## 2020-03-04 NOTE — Assessment & Plan Note (Signed)
stable overall by history and exam, recent data reviewed with pt, and pt to continue medical treatment as before,  to f/u any worsening symptoms or concerns  

## 2020-03-04 NOTE — Progress Notes (Signed)
Subjective:    Patient ID: Rebecca Marks, female    DOB: 1956/04/11, 64 y.o.   MRN: [id number]  HPI  Here to c/o brusing/swelling x 1 wk to the right hand first web space after a cat scratched her (no bite) and without fever, chills, redness, tednder and overall improved but still can see the bruised swelling and mild tender, better with tylenol.  Just concerned it is taking so long to improve.  Also, Pt continues to have recurring LBP without change in severity, bowel or bladder change, fever, wt loss,  worsening LE pain/numbness/weakness, gait change or falls. Just wanted to document this so she is still eligible for her regular narcotic refills.  Pt denies chest pain, increased sob or doe, wheezing, orthopnea, PND, increased LE swelling, palpitations, dizziness or syncope.   Pt denies polydipsia, polyuria  Denies worsening depressive symptoms, suicidal ideation, or panic; has ongoing anxiety Past Medical History:  Diagnosis Date  . Allergy   . Anxiety   . Arthritis    R wrist and R knee  . CAP (community acquired pneumonia) 4/17-22/15   Rochephin & IV Zithromax & 3 days Levaquin  . Chronic back pain   . DVT (deep venous thrombosis) ([place])    a. in her 20s while on OCPs.  . Hepatitis C    contaminated blood @ her job, prior notes indicate spontaneously cleared  . History of tobacco abuse   . Osteoarthritis   . Palpitations   . Premature atrial contractions    a. Holter 11/2013: few PACs.  . Pulmonary function study abnormality    a. 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  . Raynaud phenomenon   . Ruptured lumbar disc    4 herniated disc   . Volume overload    a.  history of volume overload during hospital stay for PNA in 08/2013 felt iatrogenic due to receiving a lot of IV fluid. 2D echo 08/2013 showed EF 79-02%, normal diastolic function parameters.   Past Surgical History:  Procedure Laterality Date  . CHOLECYSTECTOMY  1985  . lumbar stem cell   2020     reports that she quit smoking about 6 years ago. Her smoking use included cigarettes. She has a 6.25 pack-year smoking history. She has never used smokeless tobacco. She reports current alcohol use. She reports that she does not use drugs. family history includes Alzheimer's disease in her maternal grandmother; Breast cancer in her mother and sister; Diabetes in her father; Heart attack in her father; Heart disease in her father; Hypertension in her father; Lymphoma in her father; Stroke in her maternal grandfather; Stroke (age of onset: 34) in her mother. Allergies  Allergen Reactions  . Ritalin [Methylphenidate Hcl]     Too hyper   Current Outpatient Medications on File Prior to Visit  Medication Sig Dispense Refill  . Ascorbic Acid (VITAMIN C) 1000 MG tablet Take 2,000 mg by mouth 2 (two) times daily.     . Cholecalciferol (VITAMIN D3) 2000 UNITS TABS Take 2 tablets by mouth daily.    [name] CORAL CALCIUM PO Take by mouth. 1000mg -3000mg  daily.    . cyclobenzaprine (FLEXERIL) 10 MG tablet Take 1 tablet (10 mg total) by mouth at bedtime as needed. 30 tablet 2  . doxepin (SINEQUAN) 10 MG capsule TAKE 1 CAPSULE BY MOUTH EVERY DAY AT BEDTIME AS NEEDED 90 capsule 1  . gabapentin (NEURONTIN) 100 MG capsule 100mg  at 10a, 2pm, 6pm 360 capsule 2  . HYDROcodone-acetaminophen (NORCO/VICODIN) 5-325 MG  tablet Take 0.5-1 tablets by mouth every 6 (six) hours as needed for severe pain. Do not take with Tramadol 100 tablet 0  . ibuprofen (ADVIL,MOTRIN) 800 MG tablet Take 800 mg by mouth at bedtime as needed.    . methylPREDNISolone (MEDROL DOSEPAK) 4 MG TBPK tablet As directed 21 tablet 0  . Nutritional Supplements (JOINT FORMULA PO) Take by mouth.    . Omega-3 Fatty Acids (ULTRA OMEGA 3 PO) Take by mouth. 1-2 grams daily.    [name] PHOSPHATIDYLSERINE PO Take 300 mg by mouth 2 (two) times a day.     . traMADol (ULTRAM) 50 MG tablet Take 1-2 tablets (50-100 mg total) by mouth 2 (two) times daily as needed for moderate  pain. for pain 100 tablet 2  . VOLTAREN 1 % GEL Apply 1 application topically at bedtime. Applied to knee, hand, fingers.    . zaleplon (SONATA) 10 MG capsule Take 1 capsule (10 mg total) by mouth at bedtime as needed for sleep. 90 capsule 1   No current facility-administered medications on file prior to visit.   Review of Systems All otherwise neg per pt     Objective:   Physical Exam BP 120/70 (BP Location: Left Arm, Patient Position: Sitting, Cuff Size: Large)   Pulse 92   Temp 98.4 F (36.9 C) (Oral)   Ht 5' 5.75" (1.67 m)   Wt 123 lb (55.8 kg)   SpO2 95%   BMI 20.00 kg/m  VS noted,  Constitutional: Pt appears in NAD HENT: Head: NCAT.  Right Ear: External ear normal.  Left Ear: External ear normal.  Eyes: . Pupils are equal, round, and reactive to light. Conjunctivae and EOM are normal Nose: without d/c or deformity Neck: Neck supple. Gross normal ROM Cardiovascular: Normal rate and regular rhythm.   Pulmonary/Chest: Effort normal and breath sounds without rales or wheezing.  Right hand first web space with minimally tender small hematoma without red or drainage Neurological: Pt is alert. At baseline orientation, motor grossly intact Skin: Skin is warm. No rashes, other new lesions, no LE edema Psychiatric: Pt behavior is normal without agitation  All otherwise neg per pt Lab Results  Component Value Date   WBC 9.1 04/21/2018   HGB 15.0 04/21/2018   HCT 42.1 04/21/2018   PLT 286.0 04/21/2018   GLUCOSE 91 07/08/2018   CHOL 164 04/21/2018   TRIG 129.0 04/21/2018   HDL 48.70 04/21/2018   LDLCALC 90 04/21/2018   ALT 7 04/21/2018   AST 17 04/21/2018   NA 140 07/08/2018   K 3.8 07/08/2018   CL 102 07/08/2018   CREATININE 0.82 07/08/2018   BUN 18 07/08/2018   CO2 30 07/08/2018   TSH 2.04 04/21/2018   INR 1.0 09/12/2015   HGBA1C 4.4 (L) 07/30/2006       Assessment & Plan:

## 2020-03-06 ENCOUNTER — Other Ambulatory Visit: Payer: Self-pay | Admitting: Neurology

## 2020-04-26 ENCOUNTER — Ambulatory Visit: Payer: BC Managed Care – PPO | Admitting: Internal Medicine

## 2020-04-26 ENCOUNTER — Ambulatory Visit (INDEPENDENT_AMBULATORY_CARE_PROVIDER_SITE_OTHER): Payer: BC Managed Care – PPO

## 2020-04-26 ENCOUNTER — Other Ambulatory Visit: Payer: Self-pay

## 2020-04-26 ENCOUNTER — Encounter: Payer: Self-pay | Admitting: Internal Medicine

## 2020-04-26 VITALS — BP 110/74 | HR 89 | Temp 99.7°F | Wt 123.3 lb

## 2020-04-26 DIAGNOSIS — R059 Cough, unspecified: Secondary | ICD-10-CM | POA: Diagnosis not present

## 2020-04-26 DIAGNOSIS — G8929 Other chronic pain: Secondary | ICD-10-CM

## 2020-04-26 DIAGNOSIS — M5442 Lumbago with sciatica, left side: Secondary | ICD-10-CM

## 2020-04-26 DIAGNOSIS — M25561 Pain in right knee: Secondary | ICD-10-CM | POA: Diagnosis not present

## 2020-04-26 DIAGNOSIS — E559 Vitamin D deficiency, unspecified: Secondary | ICD-10-CM | POA: Diagnosis not present

## 2020-04-26 DIAGNOSIS — M25562 Pain in left knee: Secondary | ICD-10-CM

## 2020-04-26 DIAGNOSIS — M5441 Lumbago with sciatica, right side: Secondary | ICD-10-CM

## 2020-04-26 MED ORDER — CYCLOBENZAPRINE HCL 10 MG PO TABS
10.0000 mg | ORAL_TABLET | Freq: Every evening | ORAL | 2 refills | Status: DC | PRN
Start: 2020-04-26 — End: 2020-07-23

## 2020-04-26 MED ORDER — HYDROCODONE-ACETAMINOPHEN 5-325 MG PO TABS: 0.5000 | ORAL_TABLET | Freq: Four times a day (QID) | ORAL | 0 refills | Status: DC | PRN

## 2020-04-26 MED ORDER — AZITHROMYCIN 250 MG PO TABS: ORAL_TABLET | ORAL | 0 refills | Status: DC

## 2020-04-26 MED ORDER — FLUCONAZOLE 150 MG PO TABS: 150.0000 mg | ORAL_TABLET | Freq: Once | ORAL | 1 refills | Status: AC

## 2020-04-26 MED ORDER — DOXEPIN HCL 10 MG PO CAPS
ORAL_CAPSULE | ORAL | 1 refills | Status: DC
Start: 2020-04-26 — End: 2020-09-13

## 2020-04-26 MED ORDER — GABAPENTIN 100 MG PO CAPS
300.0000 mg | ORAL_CAPSULE | Freq: Every day | ORAL | 2 refills | Status: DC
Start: 2020-04-26 — End: 2021-01-17

## 2020-04-26 MED ORDER — TRAMADOL HCL 50 MG PO TABS
50.0000 mg | ORAL_TABLET | Freq: Two times a day (BID) | ORAL | 2 refills | Status: DC | PRN
Start: 2020-04-26 — End: 2020-07-23

## 2020-04-26 NOTE — Progress Notes (Signed)
Subjective:  Patient ID: Rebecca Marks, female    DOB: 25-Feb-1956  Age: 64 y.o. MRN: [id number]  CC: Cough (Pt states she is having pain around her lung on the left side. Requesting cxr//Also requesting medication refills)   HPI Rebecca Marks presents for chronic LBP She is concerned of fever and some cough for a few days.  Several of her family members were diagnosed with Covid.  She had no recent direct contact with them.  Her Covid test was negative  Outpatient Medications Prior to Visit  Medication Sig Dispense Refill  . Ascorbic Acid (VITAMIN C) 1000 MG tablet Take 2,000 mg by mouth 2 (two) times daily.     . Cholecalciferol (VITAMIN D3) 2000 UNITS TABS Take 2 tablets by mouth daily.    [name] CORAL CALCIUM PO Take by mouth. 1000mg -3000mg  daily.    [name] ibuprofen (ADVIL,MOTRIN) 800 MG tablet Take 800 mg by mouth at bedtime as needed.    . Nutritional Supplements (JOINT FORMULA PO) Take by mouth.    . Omega-3 Fatty Acids (ULTRA OMEGA 3 PO) Take by mouth. 1-2 grams daily.    [name] PHOSPHATIDYLSERINE PO Take 300 mg by mouth 2 (two) times a day.     . VOLTAREN 1 % GEL Apply 1 application topically at bedtime. Applied to knee, hand, fingers.    . zaleplon (SONATA) 10 MG capsule Take 1 capsule (10 mg total) by mouth at bedtime as needed for sleep. 90 capsule 1  . cyclobenzaprine (FLEXERIL) 10 MG tablet Take 1 tablet (10 mg total) by mouth at bedtime as needed. 30 tablet 2  . doxepin (SINEQUAN) 10 MG capsule TAKE 1 CAPSULE BY MOUTH EVERY DAY AT BEDTIME AS NEEDED 90 capsule 1  . gabapentin (NEURONTIN) 100 MG capsule 100mg  at 10a, 2pm, 6pm 360 capsule 2  . HYDROcodone-acetaminophen (NORCO/VICODIN) 5-325 MG tablet Take 0.5-1 tablets by mouth every 6 (six) hours as needed for severe pain. Do not take with Tramadol 100 tablet 0  . traMADol (ULTRAM) 50 MG tablet Take 1-2 tablets (50-100 mg total) by mouth 2 (two) times daily as needed for moderate pain. for pain 100 tablet 2  . methylPREDNISolone  (MEDROL DOSEPAK) 4 MG TBPK tablet As directed (Patient not taking: Reported on 04/26/2020) 21 tablet 0   No facility-administered medications prior to visit.    ROS: Review of Systems  Constitutional: Negative for activity change, appetite change, chills, fatigue and unexpected weight change.  HENT: Negative for congestion, mouth sores and sinus pressure.   Eyes: Negative for visual disturbance.  Respiratory: Negative for cough and chest tightness.   Gastrointestinal: Negative for abdominal pain and nausea.  Genitourinary: Negative for difficulty urinating, frequency and vaginal pain.  Musculoskeletal: Negative for back pain and gait problem.  Skin: Negative for pallor and rash.  Neurological: Negative for dizziness, tremors, weakness, numbness and headaches.  Psychiatric/Behavioral: Negative for confusion and sleep disturbance.    Objective:  BP 110/74 (BP Location: Left Arm)   Pulse 89   Temp 99.7 F (37.6 C) (Oral)   Wt 123 lb 4.8 oz (55.9 kg)   SpO2 98%   BMI 20.05 kg/m   BP Readings from Last 3 Encounters:  04/26/20 110/74  03/02/20 120/70  01/02/20 110/80    Wt Readings from Last 3 Encounters:  04/26/20 123 lb 4.8 oz (55.9 kg)  03/02/20 123 lb (55.8 kg)  01/02/20 122 lb (55.3 kg)    Physical Exam Constitutional:      General: She is not in  acute distress.    Appearance: She is well-developed.  HENT:     Head: Normocephalic.     Right Ear: External ear normal.     Left Ear: External ear normal.     Nose: Nose normal.     Mouth/Throat:     Mouth: Oropharynx is clear and moist.  Eyes:     General:        Right eye: No discharge.        Left eye: No discharge.     Conjunctiva/sclera: Conjunctivae normal.     Pupils: Pupils are equal, round, and reactive to light.  Neck:     Thyroid: No thyromegaly.     Vascular: No JVD.     Trachea: No tracheal deviation.  Cardiovascular:     Rate and Rhythm: Normal rate and regular rhythm.     Heart sounds: Normal  heart sounds.  Pulmonary:     Effort: No respiratory distress.     Breath sounds: No stridor. No wheezing.  Abdominal:     General: Bowel sounds are normal. There is no distension.     Palpations: Abdomen is soft. There is no mass.     Tenderness: There is no abdominal tenderness. There is no guarding or rebound.  Musculoskeletal:        General: No tenderness or edema.     Cervical back: Normal range of motion and neck supple.  Lymphadenopathy:     Cervical: No cervical adenopathy.  Skin:    Findings: No erythema or rash.  Neurological:     Cranial Nerves: No cranial nerve deficit.     Motor: No abnormal muscle tone.     Coordination: Coordination normal.     Deep Tendon Reflexes: Reflexes normal.  Psychiatric:        Mood and Affect: Mood and affect normal.        Behavior: Behavior normal.        Thought Content: Thought content normal.        Judgment: Judgment normal.   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 Hyper reflexia EXAM: MRI LUMBAR SPINE WITHOUT CONTRAST TECHNIQUE: Multiplanar, multisequence MR imaging of the lumbar spine was performed. No intravenous contrast was administered. COMPARISON:  Lumbar radiographs 08/26/2017 FINDINGS: Segmentation:  Normal Alignment:  5 mm anterolisthesis L4-5.  Remaining alignment normal. Vertebrae:  Normal bone marrow.  Negative  for fracture or mass. Conus medullaris and cauda equina: Conus extends to the T12-L1 level. Conus and cauda equina appear normal. Paraspinal and other soft tissues: Negative for paraspinous mass or edema Disc levels: L1-2: Mild disc and mild facet degeneration.  Negative L2-3: Mild facet degeneration.  Negative for stenosis L3-4: Mild disc bulging. Small extraforaminal disc protrusion on the left.  Bilateral facet degeneration. Negative for stenosis. L4-5: 5 mm anterolisthesis with severe facet degeneration. Moderate spinal stenosis. Moderate subarticular stenosis bilaterally. L5-S1: Bilateral facet degeneration.  No significant stenosis. IMPRESSION: 5 mm anterolisthesis L4-5 with severe facet degeneration. Moderate spinal stenosis and moderate subarticular stenosis bilaterally Mild degenerative changes throughout the remainder of the lumbar spine as detailed above. Electronically Signed   By: Franchot Gallo M.D.   On: 07/29/2018 15:16    Assessment & Plan:   Cordia was seen today for cough.  Diagnoses and all orders for this visit:  Bilateral anterior knee pain -     HYDROcodone-acetaminophen (NORCO/VICODIN) 5-325 MG tablet; Take 0.5-1 tablets by mouth every 6 (six) hours as needed for severe pain. Do not take with Tramadol  Other orders -     cyclobenzaprine (FLEXERIL) 10 MG tablet; Take 1 tablet (10 mg total) by mouth at bedtime as needed. -     doxepin (SINEQUAN) 10 MG capsule; TAKE 1 CAPSULE BY MOUTH EVERY DAY AT BEDTIME AS NEEDED -     gabapentin (NEURONTIN) 100 MG capsule; Take 3 capsules (300 mg total) by mouth at bedtime. -     traMADol (ULTRAM) 50 MG tablet; Take 1-2 tablets (50-100 mg total) by mouth 2 (two) times daily as needed for moderate pain. for pain     Meds ordered this encounter  Medications  . cyclobenzaprine (FLEXERIL) 10 MG tablet    Sig: Take 1 tablet (10 mg total) by mouth at bedtime as needed.    Dispense:  30 tablet    Refill:  2  . doxepin (SINEQUAN) 10 MG  capsule    Sig: TAKE 1 CAPSULE BY MOUTH EVERY DAY AT BEDTIME AS NEEDED    Dispense:  90 capsule    Refill:  1  . gabapentin (NEURONTIN) 100 MG capsule    Sig: Take 3 capsules (300 mg total) by mouth at bedtime.    Dispense:  360 capsule    Refill:  2  . HYDROcodone-acetaminophen (NORCO/VICODIN) 5-325 MG tablet    Sig: Take 0.5-1 tablets by mouth every 6 (six) hours as needed for severe pain. Do not take with Tramadol    Dispense:  100 tablet    Refill:  0  . traMADol (ULTRAM) 50 MG tablet    Sig: Take 1-2 tablets (50-100 mg total) by mouth 2 (two) times daily as needed for moderate pain. for pain    Dispense:  100 tablet    Refill:  2     Follow-up: No follow-ups on file.  Walker Kehr, MD

## 2020-04-26 NOTE — Patient Instructions (Signed)
   B-complex with Niacin 100 mg    Lion's mane  

## 2020-04-28 ENCOUNTER — Encounter: Payer: Self-pay | Admitting: Internal Medicine

## 2020-04-28 DIAGNOSIS — R059 Cough, unspecified: Secondary | ICD-10-CM | POA: Insufficient documentation

## 2020-04-28 NOTE — Assessment & Plan Note (Addendum)
Discussed ongoing pain.  The pain has been getting worse.  She may want to see a neurosurgeon.  Continue with Flexeril, gabapentin, tramadol, as needed ibuprofen.

## 2020-04-28 NOTE — Assessment & Plan Note (Signed)
Continue with vitamin D 

## 2020-04-28 NOTE — Assessment & Plan Note (Signed)
Unclear etiology.  Chest x-ray.  Prescribed Z-Pak

## 2020-05-19 HISTORY — PX: OTHER SURGICAL HISTORY: SHX169

## 2020-05-28 DIAGNOSIS — B373 Candidiasis of vulva and vagina: Secondary | ICD-10-CM | POA: Diagnosis not present

## 2020-05-28 DIAGNOSIS — Z01419 Encounter for gynecological examination (general) (routine) without abnormal findings: Secondary | ICD-10-CM | POA: Diagnosis not present

## 2020-05-28 DIAGNOSIS — Z6820 Body mass index (BMI) 20.0-20.9, adult: Secondary | ICD-10-CM | POA: Diagnosis not present

## 2020-06-13 ENCOUNTER — Ambulatory Visit: Payer: BC Managed Care – PPO | Admitting: Internal Medicine

## 2020-06-13 ENCOUNTER — Encounter: Payer: Self-pay | Admitting: Internal Medicine

## 2020-06-13 ENCOUNTER — Other Ambulatory Visit: Payer: Self-pay

## 2020-06-13 DIAGNOSIS — G8929 Other chronic pain: Secondary | ICD-10-CM

## 2020-06-13 DIAGNOSIS — E559 Vitamin D deficiency, unspecified: Secondary | ICD-10-CM | POA: Diagnosis not present

## 2020-06-13 DIAGNOSIS — M25561 Pain in right knee: Secondary | ICD-10-CM | POA: Diagnosis not present

## 2020-06-13 DIAGNOSIS — M5441 Lumbago with sciatica, right side: Secondary | ICD-10-CM

## 2020-06-13 DIAGNOSIS — F5101 Primary insomnia: Secondary | ICD-10-CM

## 2020-06-13 DIAGNOSIS — M5442 Lumbago with sciatica, left side: Secondary | ICD-10-CM

## 2020-06-13 DIAGNOSIS — M25562 Pain in left knee: Secondary | ICD-10-CM

## 2020-06-13 MED ORDER — ZALEPLON 10 MG PO CAPS: 10.0000 mg | ORAL_CAPSULE | Freq: Every evening | ORAL | 1 refills | Status: DC | PRN

## 2020-06-13 MED ORDER — HYDROCODONE-ACETAMINOPHEN 5-325 MG PO TABS: 0.5000 | ORAL_TABLET | Freq: Four times a day (QID) | ORAL | 0 refills | Status: DC | PRN

## 2020-06-13 NOTE — Assessment & Plan Note (Signed)
Zaleplon prn   Potential benefits of a long term benzodiazepines  use as well as potential risks  and complications were explained to the patient and were aknowledged. 

## 2020-06-13 NOTE — Assessment & Plan Note (Signed)
On Vit D 

## 2020-06-13 NOTE — Patient Instructions (Signed)
For a mild COVID-19 case you can take zinc 50 mg a day for 1 week, vitamin C 1000 mg daily for 1 week, vitamin D2 50,000 units weekly for 2 months (unless you are taking vitamin D daily already), Quercetin 500 mg twice a day for 1 week (if you can get it quick enough).  Maintain good oral hydration and take Tylenol with high fever. 

## 2020-06-13 NOTE — Progress Notes (Signed)
Subjective:  Patient ID: Rebecca Marks, female    DOB: 27-Aug-1955  Age: 65 y.o. MRN: [id number]  CC: Medication Refill (Req a refill on Hydrocodone. )   HPI Rebecca Marks presents for LBP - worse, insomnia, OA  Outpatient Medications Prior to Visit  Medication Sig Dispense Refill  . Ascorbic Acid (VITAMIN C) 1000 MG tablet Take 2,000 mg by mouth 2 (two) times daily.     [name] azithromycin (ZITHROMAX Z-PAK) 250 MG tablet As directed 6 tablet 0  . Cholecalciferol (VITAMIN D3) 2000 UNITS TABS Take 2 tablets by mouth daily.    [name] CORAL CALCIUM PO Take by mouth. 1000mg -3000mg  daily.    . cyclobenzaprine (FLEXERIL) 10 MG tablet Take 1 tablet (10 mg total) by mouth at bedtime as needed. 30 tablet 2  . doxepin (SINEQUAN) 10 MG capsule TAKE 1 CAPSULE BY MOUTH EVERY DAY AT BEDTIME AS NEEDED 90 capsule 1  . gabapentin (NEURONTIN) 100 MG capsule Take 3 capsules (300 mg total) by mouth at bedtime. 360 capsule 2  . HYDROcodone-acetaminophen (NORCO/VICODIN) 5-325 MG tablet Take 0.5-1 tablets by mouth every 6 (six) hours as needed for severe pain. Do not take with Tramadol 100 tablet 0  . ibuprofen (ADVIL,MOTRIN) 800 MG tablet Take 800 mg by mouth at bedtime as needed.    . Nutritional Supplements (JOINT FORMULA PO) Take by mouth.    . Omega-3 Fatty Acids (ULTRA OMEGA 3 PO) Take by mouth. 1-2 grams daily.    [name] PHOSPHATIDYLSERINE PO Take 300 mg by mouth 2 (two) times a day.     . traMADol (ULTRAM) 50 MG tablet Take 1-2 tablets (50-100 mg total) by mouth 2 (two) times daily as needed for moderate pain. for pain 100 tablet 2  . VOLTAREN 1 % GEL Apply 1 application topically at bedtime. Applied to knee, hand, fingers.    . zaleplon (SONATA) 10 MG capsule Take 1 capsule (10 mg total) by mouth at bedtime as needed for sleep. 90 capsule 1   No facility-administered medications prior to visit.    ROS: Review of Systems  Constitutional: Negative for activity change, appetite change, chills, fatigue and  unexpected weight change.  HENT: Negative for congestion, mouth sores and sinus pressure.   Eyes: Negative for visual disturbance.  Respiratory: Negative for cough and chest tightness.   Gastrointestinal: Negative for abdominal pain and nausea.  Genitourinary: Negative for difficulty urinating, frequency and vaginal pain.  Musculoskeletal: Positive for arthralgias and back pain. Negative for gait problem.  Skin: Negative for pallor and rash.  Neurological: Negative for dizziness, tremors, weakness, numbness and headaches.  Psychiatric/Behavioral: Positive for sleep disturbance. Negative for confusion.    Objective:  BP 114/70   Pulse 86   Temp 98.7 F (37.1 C) (Oral)   Ht 5' 6.9" (1.699 m)   Wt 124 lb (56.2 kg)   BMI 19.48 kg/m   BP Readings from Last 3 Encounters:  06/13/20 114/70  04/26/20 110/74  03/02/20 120/70    Wt Readings from Last 3 Encounters:  06/13/20 124 lb (56.2 kg)  04/26/20 123 lb 4.8 oz (55.9 kg)  03/02/20 123 lb (55.8 kg)    Physical Exam Constitutional:      General: She is not in acute distress.    Appearance: She is well-developed.  HENT:     Head: Normocephalic.     Right Ear: External ear normal.     Left Ear: External ear normal.     Nose: Nose normal.  Mouth/Throat:     Mouth: Oropharynx is clear and moist.  Eyes:     General:        Right eye: No discharge.        Left eye: No discharge.     Conjunctiva/sclera: Conjunctivae normal.     Pupils: Pupils are equal, round, and reactive to light.  Neck:     Thyroid: No thyromegaly.     Vascular: No JVD.     Trachea: No tracheal deviation.  Cardiovascular:     Rate and Rhythm: Normal rate and regular rhythm.     Heart sounds: Normal heart sounds.  Pulmonary:     Effort: No respiratory distress.     Breath sounds: No stridor. No wheezing.  Abdominal:     General: Bowel sounds are normal. There is no distension.     Palpations: Abdomen is soft. There is no mass.     Tenderness: There  is no abdominal tenderness. There is no guarding or rebound.  Musculoskeletal:        General: Tenderness present. No edema.     Cervical back: Normal range of motion and neck supple.  Lymphadenopathy:     Cervical: No cervical adenopathy.  Skin:    Findings: No erythema or rash.  Neurological:     Mental Status: She is oriented to person, place, and time.     Cranial Nerves: No cranial nerve deficit.     Motor: No abnormal muscle tone.     Coordination: Coordination normal.     Deep Tendon Reflexes: Reflexes normal.  Psychiatric:        Mood and Affect: Mood and affect normal.        Behavior: Behavior normal.        Thought Content: Thought content normal.        Judgment: Judgment normal.     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 Hyper reflexia EXAM: MRI LUMBAR SPINE WITHOUT CONTRAST TECHNIQUE: Multiplanar, multisequence MR imaging of the lumbar spine was performed. No intravenous contrast was administered. COMPARISON:  Lumbar radiographs 08/26/2017 FINDINGS: Segmentation:  Normal Alignment:  5 mm anterolisthesis L4-5.  Remaining alignment normal. Vertebrae:  Normal bone marrow.  Negative for fracture or mass. Conus medullaris and cauda equina: Conus extends to the T12-L1 level. Conus and cauda equina appear normal. Paraspinal and other soft tissues: Negative for paraspinous mass or edema Disc levels:  L1-2: Mild disc and mild facet degeneration.  Negative L2-3: Mild facet degeneration.  Negative for stenosis L3-4: Mild disc bulging. Small extraforaminal disc protrusion on the left. Bilateral facet degeneration. Negative for stenosis. L4-5: 5 mm anterolisthesis with severe facet degeneration. Moderate spinal stenosis. Moderate subarticular stenosis bilaterally. L5-S1: Bilateral facet degeneration.  No significant stenosis. IMPRESSION: 5 mm anterolisthesis L4-5 with severe facet  degeneration. Moderate spinal stenosis and moderate subarticular stenosis bilaterally Mild degenerative changes throughout the remainder of the lumbar spine as detailed above. Electronically Signed   By: Franchot Gallo M.D.   On: 07/29/2018 15:16    Assessment & Plan:    Walker Kehr, MD

## 2020-06-13 NOTE — Assessment & Plan Note (Addendum)
Worse. Appt w/Dr Vertell Limber in April Increased Norco to up to 3 a day Tramadol low dose  Potential benefits of a long term opioids use as well as potential risks (i.e. addiction risk, apnea etc) and complications (i.e. Somnolence, constipation and others) were explained to the patient and were aknowledged.  Very rarely Rebecca Marks needs to use half tablet of Norco as needed if tramadol does not help.

## 2020-06-28 ENCOUNTER — Encounter: Payer: Self-pay | Admitting: Gastroenterology

## 2020-07-18 ENCOUNTER — Other Ambulatory Visit: Payer: Self-pay

## 2020-07-23 ENCOUNTER — Encounter: Payer: Self-pay | Admitting: Internal Medicine

## 2020-07-23 ENCOUNTER — Ambulatory Visit: Payer: BC Managed Care – PPO | Admitting: Internal Medicine

## 2020-07-23 ENCOUNTER — Other Ambulatory Visit: Payer: Self-pay

## 2020-07-23 DIAGNOSIS — M5441 Lumbago with sciatica, right side: Secondary | ICD-10-CM

## 2020-07-23 DIAGNOSIS — M25561 Pain in right knee: Secondary | ICD-10-CM | POA: Diagnosis not present

## 2020-07-23 DIAGNOSIS — G8929 Other chronic pain: Secondary | ICD-10-CM

## 2020-07-23 DIAGNOSIS — F5101 Primary insomnia: Secondary | ICD-10-CM | POA: Diagnosis not present

## 2020-07-23 DIAGNOSIS — J988 Other specified respiratory disorders: Secondary | ICD-10-CM

## 2020-07-23 DIAGNOSIS — M25562 Pain in left knee: Secondary | ICD-10-CM

## 2020-07-23 DIAGNOSIS — F17200 Nicotine dependence, unspecified, uncomplicated: Secondary | ICD-10-CM | POA: Diagnosis not present

## 2020-07-23 DIAGNOSIS — M5442 Lumbago with sciatica, left side: Secondary | ICD-10-CM

## 2020-07-23 MED ORDER — HYDROCODONE-ACETAMINOPHEN 5-325 MG PO TABS: 0.5000 | ORAL_TABLET | Freq: Four times a day (QID) | ORAL | 0 refills | Status: DC | PRN

## 2020-07-23 MED ORDER — TRAMADOL HCL 50 MG PO TABS
50.0000 mg | ORAL_TABLET | Freq: Two times a day (BID) | ORAL | 2 refills | Status: DC | PRN
Start: 2020-07-23 — End: 2020-11-21

## 2020-07-23 MED ORDER — CYCLOBENZAPRINE HCL 10 MG PO TABS
10.0000 mg | ORAL_TABLET | Freq: Every evening | ORAL | 2 refills | Status: DC | PRN
Start: 2020-07-23 — End: 2020-11-05

## 2020-07-23 MED ORDER — VARENICLINE TARTRATE 1 MG PO TABS: 1.0000 mg | ORAL_TABLET | Freq: Every day | ORAL | 1 refills | Status: DC

## 2020-07-23 NOTE — Progress Notes (Signed)
Subjective:  Patient ID: Rebecca Marks, female    DOB: [birth year]  Age: 65 y.o. MRN: [id number]  CC: Follow-up (Req refill on Hydrocodone, Cyclobenzaprine and Tramadol)   HPI Rebecca Marks presents for LBP, OA She had a viral cold a few days ago, recovered Rebecca Marks wants to quit smoking because she started to feel short of breath, currently she is smoking 10 cigs a day.  In the past she quit smoking once using Chantix for 3 days only.  She thinks it helped.  Outpatient Medications Prior to Visit  Medication Sig Dispense Refill  . Ascorbic Acid (VITAMIN C) 1000 MG tablet Take 2,000 mg by mouth 2 (two) times daily.     . Cholecalciferol (VITAMIN D3) 2000 UNITS TABS Take 2 tablets by mouth daily.    [name] CORAL CALCIUM PO Take by mouth. 1000mg -3000mg  daily.    . cyclobenzaprine (FLEXERIL) 10 MG tablet Take 1 tablet (10 mg total) by mouth at bedtime as needed. 30 tablet 2  . doxepin (SINEQUAN) 10 MG capsule TAKE 1 CAPSULE BY MOUTH EVERY DAY AT BEDTIME AS NEEDED 90 capsule 1  . gabapentin (NEURONTIN) 100 MG capsule Take 3 capsules (300 mg total) by mouth at bedtime. 360 capsule 2  . HYDROcodone-acetaminophen (NORCO/VICODIN) 5-325 MG tablet Take 0.5-1 tablets by mouth every 6 (six) hours as needed for severe pain. Do not take with Tramadol 120 tablet 0  . Nutritional Supplements (JOINT FORMULA PO) Take by mouth.    . Omega-3 Fatty Acids (ULTRA OMEGA 3 PO) Take by mouth. 1-2 grams daily.    [name] PHOSPHATIDYLSERINE PO Take 300 mg by mouth 2 (two) times a day.     . traMADol (ULTRAM) 50 MG tablet Take 1-2 tablets (50-100 mg total) by mouth 2 (two) times daily as needed for moderate pain. for pain 100 tablet 2  . UNABLE TO FIND Take 1,000 mg by mouth daily. Med Name: [place]    . VOLTAREN 1 % GEL Apply 1 application topically at bedtime. Applied to knee, hand, fingers.    . zaleplon (SONATA) 10 MG capsule Take 1 capsule (10 mg total) by mouth at bedtime as needed for sleep. 90 capsule 1  .  ibuprofen (ADVIL,MOTRIN) 800 MG tablet Take 800 mg by mouth at bedtime as needed. (Patient not taking: No sig reported)     No facility-administered medications prior to visit.    ROS: Review of Systems  Constitutional: Negative for activity change, appetite change, chills, fatigue and unexpected weight change.  HENT: Negative for congestion, mouth sores and sinus pressure.   Eyes: Negative for visual disturbance.  Respiratory: Positive for shortness of breath. Negative for cough and chest tightness.   Gastrointestinal: Negative for abdominal pain and nausea.  Genitourinary: Negative for difficulty urinating, frequency and vaginal pain.  Musculoskeletal: Positive for arthralgias and back pain. Negative for gait problem.  Skin: Negative for pallor and rash.  Neurological: Negative for dizziness, tremors, weakness, numbness and headaches.  Psychiatric/Behavioral: Negative for confusion and sleep disturbance.    Objective:  BP 110/78 (BP Location: Left Arm)   Pulse 86   Temp 98.6 F (37 C) (Oral)   Ht 5' 6.5" (1.689 m)   Wt 126 lb 9.6 oz (57.4 kg)   SpO2 97%   BMI 20.13 kg/m   BP Readings from Last 3 Encounters:  07/23/20 110/78  06/13/20 114/70  04/26/20 110/74    Wt Readings from Last 3 Encounters:  07/23/20 126 lb 9.6 oz (57.4 kg)  06/13/20 124  lb (56.2 kg)  04/26/20 123 lb 4.8 oz (55.9 kg)    Physical Exam Constitutional:      General: She is not in acute distress.    Appearance: She is well-developed.  HENT:     Head: Normocephalic.     Right Ear: External ear normal.     Left Ear: External ear normal.     Nose: Nose normal.     Mouth/Throat:     Mouth: Oropharynx is clear and moist.  Eyes:     General:        Right eye: No discharge.        Left eye: No discharge.     Conjunctiva/sclera: Conjunctivae normal.     Pupils: Pupils are equal, round, and reactive to light.  Neck:     Thyroid: No thyromegaly.     Vascular: No JVD.     Trachea: No tracheal  deviation.  Cardiovascular:     Rate and Rhythm: Normal rate and regular rhythm.     Heart sounds: Normal heart sounds.  Pulmonary:     Effort: No respiratory distress.     Breath sounds: No stridor. No wheezing.  Abdominal:     General: Bowel sounds are normal. There is no distension.     Palpations: Abdomen is soft. There is no mass.     Tenderness: There is no abdominal tenderness. There is no guarding or rebound.  Musculoskeletal:        General: Tenderness present. No edema.     Cervical back: Normal range of motion and neck supple.  Lymphadenopathy:     Cervical: No cervical adenopathy.  Skin:    Findings: No erythema or rash.  Neurological:     Cranial Nerves: No cranial nerve deficit.     Motor: No abnormal muscle tone.     Coordination: Coordination normal.     Deep Tendon Reflexes: Reflexes normal.  Psychiatric:        Mood and Affect: Mood and affect normal.        Behavior: Behavior normal.        Thought Content: Thought content normal.        Judgment: Judgment normal.   Lumbar spine is tender with range of motion.  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 Hyper reflexia EXAM: MRI LUMBAR SPINE WITHOUT CONTRAST TECHNIQUE: Multiplanar, multisequence MR imaging of the lumbar spine was performed. No intravenous contrast was administered. COMPARISON:  Lumbar radiographs 08/26/2017 FINDINGS:  Segmentation:  Normal Alignment:  5 mm anterolisthesis L4-5.  Remaining alignment normal. Vertebrae:  Normal bone marrow.  Negative for fracture or mass. Conus medullaris and cauda equina: Conus extends to the T12-L1 level. Conus and cauda equina appear normal. Paraspinal and other soft tissues: Negative for paraspinous mass or edema Disc  levels: L1-2: Mild disc and mild facet degeneration.  Negative L2-3: Mild facet degeneration.  Negative for stenosis L3-4: Mild disc bulging. Small extraforaminal disc protrusion on the left. Bilateral facet degeneration. Negative for stenosis. L4-5: 5 mm anterolisthesis with severe facet degeneration. Moderate spinal stenosis. Moderate subarticular stenosis bilaterally. L5-S1: Bilateral facet degeneration.  No significant stenosis. IMPRESSION: 5 mm anterolisthesis L4-5 with severe facet degeneration. Moderate spinal stenosis and moderate subarticular stenosis bilaterally Mild degenerative changes throughout the remainder of the lumbar spine as detailed above. Electronically Signed   By: Franchot Gallo M.D.   On: 07/29/2018 15:16    Assessment & Plan:   There are no diagnoses linked to this encounter.   No orders of the defined types were placed in this encounter.    Follow-up: No follow-ups on file.  Walker Kehr, MD

## 2020-07-23 NOTE — Assessment & Plan Note (Signed)
Norco Tramadol low dose  Potential benefits of a long term opioids use as well as potential risks (i.e. addiction risk, apnea etc) and complications (i.e. Somnolence, constipation and others) were explained to the patient and were aknowledged.  Very rarely Belva needs to use half tablet of Norco as needed if tramadol does not help.

## 2020-07-23 NOTE — Assessment & Plan Note (Signed)
Zaleplon prn   Potential benefits of a long term benzodiazepines  use as well as potential risks  and complications were explained to the patient and were aknowledged.

## 2020-07-23 NOTE — Assessment & Plan Note (Addendum)
We can try generic chantix for a short period of time.  Doyne is aware that brand Chantix was removed from the market.  She thinks that smoking started to give her shortness of breath and cough.  We did a chest x-ray recently.

## 2020-07-23 NOTE — Assessment & Plan Note (Signed)
Recent viral cold has resolved

## 2020-08-06 ENCOUNTER — Telehealth: Payer: Self-pay | Admitting: Internal Medicine

## 2020-08-06 DIAGNOSIS — M25561 Pain in right knee: Secondary | ICD-10-CM

## 2020-08-06 NOTE — Telephone Encounter (Signed)
Patient calling for status of refill Advised patient to allow more time

## 2020-08-06 NOTE — Telephone Encounter (Signed)
Patient called and is requesting a call back in regards to HYDROcodone-acetaminophen (NORCO/VICODIN) 5-325 MG tablet. She said that the pharmacy has faxed over a request twice. She can be reached at [phone]

## 2020-08-06 NOTE — Telephone Encounter (Signed)
Per chart no refill has came electronically, not have we received any paper request. Check [place] registry last filled 06/13/2020.Marland KitchenJohny Marks

## 2020-08-07 MED ORDER — HYDROCODONE-ACETAMINOPHEN 5-325 MG PO TABS: 0.5000 | ORAL_TABLET | Freq: Four times a day (QID) | ORAL | 0 refills | Status: DC | PRN

## 2020-08-07 NOTE — Telephone Encounter (Signed)
Notified pt MD sent refill. Pt states the problem is all the [facility] are out of the 5/325 mg. Inform pt I will call [facility] to verify the problem and give her a call back. Called [facility] spoke w/Shadad (pharmacist) he states yes the Hydrocodone 5/325 mg are on back order now for about 2 weeks. They do not know when they will get any. He states they do have 7.5/325 mg or 10/325 mg. Will require new rx. Inform Shadad to cancel rx that was sent this morning and MD will send new strength.Marland KitchenJohny Chess

## 2020-08-07 NOTE — Telephone Encounter (Signed)
Okay.  Done.  Thanks 

## 2020-08-08 MED ORDER — HYDROCODONE-ACETAMINOPHEN 10-325 MG PO TABS: 0.5000 | ORAL_TABLET | Freq: Four times a day (QID) | ORAL | 0 refills | Status: AC | PRN

## 2020-08-08 NOTE — Telephone Encounter (Signed)
Ok - Rx changed Thx

## 2020-08-08 NOTE — Addendum Note (Signed)
Addended by: Cassandria Anger on: 08/08/2020 07:57 AM   Modules accepted: Orders

## 2020-08-08 NOTE — Telephone Encounter (Signed)
Called pt there was no answer LMOM MD sent new rx to [facility].Marland KitchenJohny Chess

## 2020-08-21 DIAGNOSIS — M48061 Spinal stenosis, lumbar region without neurogenic claudication: Secondary | ICD-10-CM | POA: Diagnosis not present

## 2020-08-21 DIAGNOSIS — M533 Sacrococcygeal disorders, not elsewhere classified: Secondary | ICD-10-CM | POA: Diagnosis not present

## 2020-08-21 DIAGNOSIS — M25551 Pain in right hip: Secondary | ICD-10-CM | POA: Diagnosis not present

## 2020-08-21 DIAGNOSIS — M545 Low back pain, unspecified: Secondary | ICD-10-CM | POA: Diagnosis not present

## 2020-08-28 DIAGNOSIS — M25559 Pain in unspecified hip: Secondary | ICD-10-CM | POA: Diagnosis not present

## 2020-08-28 DIAGNOSIS — M545 Low back pain, unspecified: Secondary | ICD-10-CM | POA: Diagnosis not present

## 2020-09-03 DIAGNOSIS — M48061 Spinal stenosis, lumbar region without neurogenic claudication: Secondary | ICD-10-CM | POA: Diagnosis not present

## 2020-09-10 DIAGNOSIS — M5442 Lumbago with sciatica, left side: Secondary | ICD-10-CM | POA: Diagnosis not present

## 2020-09-10 DIAGNOSIS — M5441 Lumbago with sciatica, right side: Secondary | ICD-10-CM | POA: Diagnosis not present

## 2020-09-10 DIAGNOSIS — G8929 Other chronic pain: Secondary | ICD-10-CM | POA: Diagnosis not present

## 2020-09-10 DIAGNOSIS — M48061 Spinal stenosis, lumbar region without neurogenic claudication: Secondary | ICD-10-CM | POA: Diagnosis not present

## 2020-09-13 ENCOUNTER — Other Ambulatory Visit: Payer: Self-pay | Admitting: Internal Medicine

## 2020-09-27 DIAGNOSIS — M4316 Spondylolisthesis, lumbar region: Secondary | ICD-10-CM | POA: Diagnosis not present

## 2020-09-27 DIAGNOSIS — M47816 Spondylosis without myelopathy or radiculopathy, lumbar region: Secondary | ICD-10-CM | POA: Diagnosis not present

## 2020-09-27 DIAGNOSIS — M5416 Radiculopathy, lumbar region: Secondary | ICD-10-CM | POA: Diagnosis not present

## 2020-09-27 DIAGNOSIS — M5136 Other intervertebral disc degeneration, lumbar region: Secondary | ICD-10-CM | POA: Diagnosis not present

## 2020-10-23 DIAGNOSIS — M48062 Spinal stenosis, lumbar region with neurogenic claudication: Secondary | ICD-10-CM | POA: Diagnosis not present

## 2020-10-26 DIAGNOSIS — M47816 Spondylosis without myelopathy or radiculopathy, lumbar region: Secondary | ICD-10-CM | POA: Diagnosis not present

## 2020-10-26 DIAGNOSIS — M4316 Spondylolisthesis, lumbar region: Secondary | ICD-10-CM | POA: Diagnosis not present

## 2020-10-26 DIAGNOSIS — M5416 Radiculopathy, lumbar region: Secondary | ICD-10-CM | POA: Diagnosis not present

## 2020-10-26 DIAGNOSIS — M48062 Spinal stenosis, lumbar region with neurogenic claudication: Secondary | ICD-10-CM | POA: Diagnosis not present

## 2020-10-29 ENCOUNTER — Other Ambulatory Visit: Payer: Self-pay | Admitting: Neurosurgery

## 2020-11-03 ENCOUNTER — Other Ambulatory Visit: Payer: Self-pay | Admitting: Internal Medicine

## 2020-11-05 DIAGNOSIS — M48062 Spinal stenosis, lumbar region with neurogenic claudication: Secondary | ICD-10-CM | POA: Diagnosis not present

## 2020-11-13 NOTE — Pre-Procedure Instructions (Signed)
Surgical Instructions    Your procedure is scheduled on Tuesday, July 5th.  Report to [facility] Main Entrance "A" at 5:30 A.M., then check in with the Admitting office.  Call this number if you have problems the morning of surgery:  [phone]   If you have any questions prior to your surgery date call [phone]: Open Monday-Friday 8am-4pm    Remember:  Do not eat or drink after midnight the night before your surgery   Take these medicines the morning of surgery with A SIP OF WATER  oxyCODONE-acetaminophen (PERCOCET)-as needed  As of today, STOP taking any Aspirin (unless otherwise instructed by your surgeon) Aleve, Naproxen, Ibuprofen, Motrin, Advil, Goody's, BC's, all herbal medications, fish oil, and all vitamins. This includes: VOLTAREN 1 % GEL.                     Do NOT Smoke (Tobacco/Vaping) or drink Alcohol 24 hours prior to your procedure.  If you use a CPAP at night, you may bring all equipment for your overnight stay.   Contacts, glasses, piercing's, hearing aid's, dentures or partials may not be worn into surgery, please bring cases for these belongings.    For patients admitted to the hospital, discharge time will be determined by your treatment team.   Patients discharged the day of surgery will not be allowed to drive home, and someone needs to stay with them for 24 hours.  ONLY 1 SUPPORT PERSON MAY BE PRESENT WHILE YOU ARE IN SURGERY. IF YOU ARE TO BE ADMITTED ONCE YOU ARE IN YOUR ROOM YOU WILL BE ALLOWED TWO (2) VISITORS.  Minor children may have two parents present. Special consideration for safety and communication needs will be reviewed on a case by case basis.   Special instructions:   [place]- Preparing For Surgery  Before surgery, you can play an important role. Because skin is not sterile, your skin needs to be as free of germs as possible. You can reduce the number of germs on your skin by washing with CHG (chlorahexidine gluconate) Soap before  surgery.  CHG is an antiseptic cleaner which kills germs and bonds with the skin to continue killing germs even after washing.    Oral Hygiene is also important to reduce your risk of infection.  Remember - BRUSH YOUR TEETH THE MORNING OF SURGERY WITH YOUR REGULAR TOOTHPASTE  Please do not use if you have an allergy to CHG or antibacterial soaps. If your skin becomes reddened/irritated stop using the CHG.  Do not shave (including legs and underarms) for at least 48 hours prior to first CHG shower. It is OK to shave your face.  Please follow these instructions carefully.   Shower the NIGHT BEFORE SURGERY and the MORNING OF SURGERY  If you chose to wash your hair, wash your hair first as usual with your normal shampoo.  After you shampoo, rinse your hair and body thoroughly to remove the shampoo.  Use CHG Soap as you would any other liquid soap. You can apply CHG directly to the skin and wash gently with a scrungie or a clean washcloth.   Apply the CHG Soap to your body ONLY FROM THE NECK DOWN.  Do not use on open wounds or open sores. Avoid contact with your eyes, ears, mouth and genitals (private parts). Wash Face and genitals (private parts)  with your normal soap.   Wash thoroughly, paying special attention to the area where your surgery will be performed.  Thoroughly  rinse your body with warm water from the neck down.  DO NOT shower/wash with your normal soap after using and rinsing off the CHG Soap.  Pat yourself dry with a CLEAN TOWEL.  Wear CLEAN PAJAMAS to bed the night before surgery  Place CLEAN SHEETS on your bed the night before your surgery  DO NOT SLEEP WITH PETS.   Day of Surgery: Shower with CHG soap. Do not wear jewelry, make up, nail polish, gel polish, artificial nails, or any other type of covering on natural nails including finger and toenails. If patients have artificial nails, gel coating, etc. that need to be removed by a nail salon please have this removed  prior to surgery. Surgery may need to be canceled/delayed if the surgeon/ anesthesia feels like the patient is unable to be adequately monitored. Do not wear lotions, powders, perfumes, or deodorant. Do not shave 48 hours prior to surgery.  Do not bring valuables to the hospital. [facility] is not responsible for any belongings or valuables. Wear Clean/Comfortable clothing the morning of surgery Remember to brush your teeth WITH YOUR REGULAR TOOTHPASTE.   Please read over the following fact sheets that you were given.

## 2020-11-14 ENCOUNTER — Encounter (HOSPITAL_COMMUNITY)
Admission: RE | Admit: 2020-11-14 | Discharge: 2020-11-14 | Disposition: A | Discharge status: Home / Self Care | Payer: BC Managed Care – PPO | Source: Ambulatory Visit | Attending: Neurosurgery | Admitting: Neurosurgery

## 2020-11-14 ENCOUNTER — Other Ambulatory Visit: Payer: Self-pay

## 2020-11-14 ENCOUNTER — Encounter (HOSPITAL_COMMUNITY): Payer: Self-pay

## 2020-11-14 DIAGNOSIS — Z01818 Encounter for other preprocedural examination: Secondary | ICD-10-CM | POA: Diagnosis not present

## 2020-11-14 HISTORY — DX: Gastro-esophageal reflux disease without esophagitis: K21.9

## 2020-11-14 HISTORY — DX: Dyspnea, unspecified: R06.00

## 2020-11-14 LAB — SURGICAL PCR SCREEN
MRSA, PCR: NEGATIVE
Staphylococcus aureus: NEGATIVE

## 2020-11-14 LAB — TYPE AND SCREEN
ABO/RH(D): O NEG
Antibody Screen: NEGATIVE

## 2020-11-14 LAB — COMPREHENSIVE METABOLIC PANEL
ALT: 9 U/L (ref 0–44)
AST: 23 U/L (ref 15–41)
Albumin: 4.5 g/dL (ref 3.5–5.0)
Alkaline Phosphatase: 57 U/L (ref 38–126)
Anion gap: 7 (ref 5–15)
BUN: 9 mg/dL (ref 8–23)
CO2: 27 mmol/L (ref 22–32)
Calcium: 9.4 mg/dL (ref 8.9–10.3)
Chloride: 103 mmol/L (ref 98–111)
Creatinine, Ser: 0.73 mg/dL (ref 0.44–1.00)
GFR, Estimated: >60 mL/min (ref >60)
Glucose, Bld: 96 mg/dL (ref 70–99)
Potassium: 3.9 mmol/L (ref 3.5–5.1)
Sodium: 137 mmol/L (ref 135–145)
Total Bilirubin: 0.7 mg/dL (ref 0.3–1.2)
Total Protein: 7 g/dL (ref 6.5–8.1)

## 2020-11-14 LAB — CBC
HCT: 39.6 % (ref 36.0–46.0)
Hemoglobin: 14.3 g/dL (ref 12.0–15.0)
MCH: 33.3 pg (ref 26.0–34.0)
MCHC: 36.1 g/dL — ABNORMAL HIGH (ref 30.0–36.0)
MCV: 92.3 fL (ref 80.0–100.0)
Platelets: 261 10*3/uL (ref 150–400)
RBC: 4.29 MIL/uL (ref 3.87–5.11)
RDW: 11.8 % (ref 11.5–15.5)
WBC: 8.3 10*3/uL (ref 4.0–10.5)
nRBC: 0 % (ref 0.0–0.2)

## 2020-11-14 NOTE — Progress Notes (Signed)
PCP: Lew Dawes, MD Cardiologist: Loralie Champagne, MD  EKG: 11/14/20 CXR: 04/26/20 ECHO: 2015 Stress Test: 2016 Cardiac Cath: denies  Fasting Blood Sugar- na Checks Blood Sugar__na_ times a day  OSA/CPAP: No  ASA/Blood Thinners: No  Covid test 11/16/20.  Gave pt address and directions, and hours  Anesthesia Review: no, cardiac studies negative per patient  Patient denies shortness of breath, fever, cough, and chest pain at PAT appointment.  Patient verbalized understanding of instructions provided today at the PAT appointment.  Patient asked to review instructions at home and day of surgery.

## 2020-11-15 DIAGNOSIS — Z803 Family history of malignant neoplasm of breast: Secondary | ICD-10-CM | POA: Diagnosis not present

## 2020-11-15 DIAGNOSIS — Z1231 Encounter for screening mammogram for malignant neoplasm of breast: Secondary | ICD-10-CM | POA: Diagnosis not present

## 2020-11-15 LAB — HM MAMMOGRAPHY

## 2020-11-16 ENCOUNTER — Other Ambulatory Visit (HOSPITAL_COMMUNITY)
Admission: RE | Admit: 2020-11-16 | Discharge: 2020-11-16 | Disposition: A | Discharge status: Home / Self Care | Payer: BC Managed Care – PPO | Source: Ambulatory Visit | Attending: Neurosurgery | Admitting: Neurosurgery

## 2020-11-16 DIAGNOSIS — Z01812 Encounter for preprocedural laboratory examination: Secondary | ICD-10-CM | POA: Insufficient documentation

## 2020-11-16 DIAGNOSIS — Z20822 Contact with and (suspected) exposure to covid-19: Secondary | ICD-10-CM | POA: Insufficient documentation

## 2020-11-16 LAB — SARS CORONAVIRUS 2 (TAT 6-24 HRS): SARS Coronavirus 2: NEGATIVE

## 2020-11-17 NOTE — H&P (Signed)
Patient ID:   [id number] Patient: Rebecca Marks  Date of Birth: 1955-07-06 Visit Type: Office Visit   Date: 10/26/2020 01:00 PM Provider: Marchia Meiers. Vertell Limber MD   This 65 year old female presents for back pain.  HISTORY OF PRESENT ILLNESS:  1.  back pain  10/23/2020 transforaminal L4-5 ESI bilaterally by Dr. Brien Few  Patient returns to discuss surgical options.  Tentative plan L4-5 PLIF  DEXA scan 05/26/2019:  Osteopenia  The patient is currently describing her pain level as 7/10 in severity.  She she is currently 65 years of age.  Lumbar imaging demonstrates mobile spondylolisthesis of L4 on L5.  She has fairly advanced spinal stenosis at this level.  The patient states that she has stopped smoking.  On examination she has increased pin sensation bilaterally in L5 distribution with bilateral EHL strength at 4/5.  She is requiring oxycodone for pain relief.         Medical/Surgical/Interim History Reviewed, no change.  Last detailed document date:01/10/2019.     PAST MEDICAL HISTORY, SURGICAL HISTORY, FAMILY HISTORY, SOCIAL HISTORY AND REVIEW OF SYSTEMS I have reviewed the patient's past medical, surgical, family and social history as well as the comprehensive review of systems as included on the [facility] history form dated 10/23/2020, which I have signed.  Family History:  Reviewed, no changes.  Last detailed document date:01/10/2019.   Social History: Reviewed, no changes. Last detailed document date: 01/10/2019.    MEDICATIONS: (added, continued or stopped this visit) Started Medication Directions Instruction Stopped   Co Q-10 200 mg capsule      Coral Calcium powder      cyclobenzaprine 10 mg tablet      doxepin 10 mg capsule      gabapentin 300 mg capsule      hydrocodone 5 mg-acetaminophen 325 mg tablet   10/26/2020   ibuprofen 800 mg tablet   10/26/2020   omega-3/dha/epa/fish oil     10/12/2020 oxycodone-acetaminophen 5  mg-325 mg tablet take 1-2 tablet by oral route  every 6 hours as needed    10/26/2020 Percocet 10 mg-325 mg tablet take 1 tablet by oral route  every 6 hours as needed     phoshatidylserine      tramadol 50 mg tablet   10/26/2020   Vitamin B-12  1,000 mcg tablet      Vitamin B-2      Vitamin B6      Vitamin C      vitamin D      zaleplon 10 mg capsule        ALLERGIES: Ingredient Reaction Medication Name Comment  NO KNOWN ALLERGIES     No known allergies. Reviewed, no changes.    PHYSICAL EXAM:   Vitals Date Temp F BP Pulse Ht In Wt Lb BMI BSA Pain Score  10/26/2020  143/88 96 66 129.2 20.85  7/10      IMPRESSION:   I had a lengthy discussion with the patient and her husband regarding her examination and imaging findings.  She has tried conservative management without relief and has noted progressive worsening of her pain in her back and into both of her legs.  She now has weakness in both of her legs.  Her imaging has progressed and she has worsened spinal stenosis at the L4-5 level.  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  She has stopped smoking  PLAN:  I have recommended proceeding with decompression and fusion at the  L4-5 level.  The patient wishes to go ahead with surgery.  Risks and benefits were discussed in detail with her and we went over models and preoperative teaching.  She will be fitted for an LSO brace.  She was given a prescription for Percocet.  The plan is tentatively for surgery on 11/20/2020  Orders: Instruction(s)/Education: Assessment Instruction  R03.0 Lifestyle education  Miscellaneous: Assessment   M48.062 LSO Brace   Completed Orders (this encounter) Order Details Reason Side Interpretation Result Initial Treatment Date Region  Lifestyle education Patient will monitor and contact primary care physician if needed.         Assessment/Plan   # Detail Type Description   1. Assessment Lumbar radiculopathy (M54.16).       2.  Assessment Lumbar facet arthropathy (M47.816).       3. Assessment Spondylolisthesis of lumbar region (M43.16).       4. 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   Plan Orders LSO Brace.       5. Assessment Elevated blood-pressure reading, w/o diagnosis of htn (R03.0).         Pain Management Plan Pain Scale: 7/10. Method: Numeric Pain Intensity Scale. Location: back. Onset: 06/03/2009. Duration: varies. Quality: discomforting. Pain management follow-up plan of care: Patient will continue medication management.[name]     MEDICATIONS PRESCRIBED TODAY    Rx Quantity Refills  PERCOCET 10 mg-325 mg  90 0            Provider:  Marchia Meiers. Vertell Limber MD  10/27/2020 01:38 PM    Dictation edited by: Marchia Meiers. [facility]    CC Providers: [facility] [address] [place],  [place]  [address]-   Donika Patel  [address] [place], [place] [address]               Electronically signed by Marchia Meiers. Vertell Limber MD on 10/27/2020 01:38 PM

## 2020-11-19 NOTE — Anesthesia Preprocedure Evaluation (Addendum)
Anesthesia Evaluation  Patient identified by MRN, date of birth, ID band Patient awake    Reviewed: Allergy & Precautions, H&P , NPO status , Patient's Chart, lab work & pertinent test results  Airway Mallampati: II  TM Distance: >3 FB Neck ROM: Full    Dental no notable dental hx. (+) Teeth Intact, Dental Advisory Given, Caps, Implants   Pulmonary neg pulmonary ROS, former smoker,    Pulmonary exam normal breath sounds clear to auscultation       Cardiovascular Exercise Tolerance: Good + Peripheral Vascular Disease and + DVT  negative cardio ROS Normal cardiovascular exam Rhythm:Regular Rate:Normal  2D echo 08/2013 showed EF 17-51%, normal diastolic function parameters.   Neuro/Psych PSYCHIATRIC DISORDERS Anxiety negative neurological ROS  negative psych ROS   GI/Hepatic negative GI ROS, Neg liver ROS, GERD  ,(+) Hepatitis -, C  Endo/Other  negative endocrine ROS  Renal/GU negative Renal ROS  negative genitourinary   Musculoskeletal negative musculoskeletal ROS (+) Arthritis , Osteoarthritis,    Abdominal   Peds negative pediatric ROS (+)  Hematology negative hematology ROS (+)   Anesthesia Other Findings Takes 20 mg total percocet qd  Reproductive/Obstetrics negative OB ROS                           Anesthesia Physical Anesthesia Plan  ASA: 2  Anesthesia Plan: General   Post-op Pain Management:    Induction: Intravenous  PONV Risk Score and Plan: 3 and Ondansetron, Dexamethasone and Midazolam  Airway Management Planned: Oral ETT  Additional Equipment: None  Intra-op Plan:   Post-operative Plan: Extubation in OR  Informed Consent: I have reviewed the patients History and Physical, chart, labs and discussed the procedure including the risks, benefits and alternatives for the proposed anesthesia with the patient or authorized representative who has indicated his/her  understanding and acceptance.       Plan Discussed with: Anesthesiologist and CRNA  Anesthesia Plan Comments: (  )       Anesthesia Quick Evaluation

## 2020-11-20 ENCOUNTER — Inpatient Hospital Stay (HOSPITAL_COMMUNITY): Payer: BC Managed Care – PPO | Admitting: Anesthesiology

## 2020-11-20 ENCOUNTER — Inpatient Hospital Stay (HOSPITAL_COMMUNITY)
Admission: RE | Disposition: A | Discharge status: Home / Self Care | Payer: Self-pay | Source: Ambulatory Visit | Attending: Neurosurgery

## 2020-11-20 ENCOUNTER — Encounter (HOSPITAL_COMMUNITY): Payer: Self-pay | Admitting: Neurosurgery

## 2020-11-20 ENCOUNTER — Inpatient Hospital Stay (HOSPITAL_COMMUNITY): Payer: BC Managed Care – PPO

## 2020-11-20 ENCOUNTER — Encounter: Payer: Self-pay | Admitting: Internal Medicine

## 2020-11-20 ENCOUNTER — Inpatient Hospital Stay (HOSPITAL_COMMUNITY)
Admission: RE | Admit: 2020-11-20 | Discharge: 2020-11-21 | DRG: 455 | Disposition: A | Discharge status: Home / Self Care | Payer: BC Managed Care – PPO | Source: Ambulatory Visit | Attending: Neurosurgery | Admitting: Neurosurgery

## 2020-11-20 DIAGNOSIS — Z86718 Personal history of other venous thrombosis and embolism: Secondary | ICD-10-CM

## 2020-11-20 DIAGNOSIS — Z79899 Other long term (current) drug therapy: Secondary | ICD-10-CM | POA: Diagnosis not present

## 2020-11-20 DIAGNOSIS — M4326 Fusion of spine, lumbar region: Secondary | ICD-10-CM | POA: Diagnosis not present

## 2020-11-20 DIAGNOSIS — M4726 Other spondylosis with radiculopathy, lumbar region: Secondary | ICD-10-CM | POA: Diagnosis present

## 2020-11-20 DIAGNOSIS — I739 Peripheral vascular disease, unspecified: Secondary | ICD-10-CM | POA: Diagnosis present

## 2020-11-20 DIAGNOSIS — M4316 Spondylolisthesis, lumbar region: Secondary | ICD-10-CM | POA: Diagnosis not present

## 2020-11-20 DIAGNOSIS — Z981 Arthrodesis status: Secondary | ICD-10-CM | POA: Diagnosis not present

## 2020-11-20 DIAGNOSIS — M48062 Spinal stenosis, lumbar region with neurogenic claudication: Secondary | ICD-10-CM | POA: Diagnosis not present

## 2020-11-20 DIAGNOSIS — Z87891 Personal history of nicotine dependence: Secondary | ICD-10-CM

## 2020-11-20 DIAGNOSIS — E559 Vitamin D deficiency, unspecified: Secondary | ICD-10-CM | POA: Diagnosis not present

## 2020-11-20 DIAGNOSIS — M5416 Radiculopathy, lumbar region: Secondary | ICD-10-CM | POA: Diagnosis not present

## 2020-11-20 DIAGNOSIS — Z419 Encounter for procedure for purposes other than remedying health state, unspecified: Secondary | ICD-10-CM

## 2020-11-20 LAB — ABO/RH: ABO/RH(D): O NEG

## 2020-11-20 SURGERY — POSTERIOR LUMBAR FUSION 1 LEVEL
Anesthesia: General | Site: Spine Lumbar

## 2020-11-20 MED ORDER — HYDROMORPHONE HCL 1 MG/ML IJ SOLN
0.5000 mg | INTRAMUSCULAR | Status: DC | PRN
  Administered 2020-11-20 (×2): 0.5 mg via INTRAVENOUS
  Filled 2020-11-20 (×2): qty 0.5

## 2020-11-20 MED ORDER — SUGAMMADEX SODIUM 200 MG/2ML IV SOLN
INTRAVENOUS | Status: DC | PRN
  Administered 2020-11-20: 200 mg via INTRAVENOUS

## 2020-11-20 MED ORDER — BUPIVACAINE HCL (PF) 0.5 % IJ SOLN
INTRAMUSCULAR | Status: DC | PRN
  Administered 2020-11-20: 5 mL

## 2020-11-20 MED ORDER — ONDANSETRON HCL 4 MG/2ML IJ SOLN: 4.0000 mg | Freq: Four times a day (QID) | INTRAMUSCULAR | Status: DC | PRN

## 2020-11-20 MED ORDER — SODIUM CHLORIDE 0.9 % IV SOLN: 250.0000 mL | INTRAVENOUS | Status: DC

## 2020-11-20 MED ORDER — PANTOPRAZOLE SODIUM 40 MG PO TBEC
40.0000 mg | DELAYED_RELEASE_TABLET | Freq: Every day | ORAL | Status: DC
  Administered 2020-11-20: 40 mg via ORAL
  Filled 2020-11-20: qty 1

## 2020-11-20 MED ORDER — MIDAZOLAM HCL 2 MG/2ML IJ SOLN
INTRAMUSCULAR | Status: AC
  Filled 2020-11-20: qty 2

## 2020-11-20 MED ORDER — OXYCODONE HCL 5 MG PO TABS
5.0000 mg | ORAL_TABLET | Freq: Once | ORAL | Status: AC | PRN
  Administered 2020-11-20: 5 mg via ORAL

## 2020-11-20 MED ORDER — ROCURONIUM BROMIDE 10 MG/ML (PF) SYRINGE
PREFILLED_SYRINGE | INTRAVENOUS | Status: AC
  Filled 2020-11-20: qty 10

## 2020-11-20 MED ORDER — BUPIVACAINE HCL (PF) 0.5 % IJ SOLN
INTRAMUSCULAR | Status: AC
  Filled 2020-11-20: qty 30

## 2020-11-20 MED ORDER — HYDROMORPHONE HCL 1 MG/ML IJ SOLN
0.2500 mg | INTRAMUSCULAR | Status: DC | PRN
  Administered 2020-11-20: 0.5 mg via INTRAVENOUS
  Administered 2020-11-20 (×2): 0.25 mg via INTRAVENOUS
  Administered 2020-11-20 (×2): 0.5 mg via INTRAVENOUS

## 2020-11-20 MED ORDER — PHENYLEPHRINE 40 MCG/ML (10ML) SYRINGE FOR IV PUSH (FOR BLOOD PRESSURE SUPPORT)
PREFILLED_SYRINGE | INTRAVENOUS | Status: AC
  Filled 2020-11-20: qty 10

## 2020-11-20 MED ORDER — THROMBIN 5000 UNITS EX SOLR: OROMUCOSAL | Status: DC | PRN

## 2020-11-20 MED ORDER — FENTANYL CITRATE (PF) 100 MCG/2ML IJ SOLN
INTRAMUSCULAR | Status: AC
  Filled 2020-11-20: qty 2

## 2020-11-20 MED ORDER — KETOROLAC TROMETHAMINE 15 MG/ML IJ SOLN
7.5000 mg | Freq: Four times a day (QID) | INTRAMUSCULAR | Status: AC
  Administered 2020-11-20 – 2020-11-21 (×4): 7.5 mg via INTRAVENOUS
  Filled 2020-11-20 (×4): qty 1

## 2020-11-20 MED ORDER — OXYCODONE HCL 5 MG PO TABS
ORAL_TABLET | ORAL | Status: AC
  Filled 2020-11-20: qty 1

## 2020-11-20 MED ORDER — HYDROCODONE-ACETAMINOPHEN 5-325 MG PO TABS: 0.5000 | ORAL_TABLET | Freq: Four times a day (QID) | ORAL | Status: DC | PRN

## 2020-11-20 MED ORDER — ACETAMINOPHEN 10 MG/ML IV SOLN: 1000.0000 mg | Freq: Once | INTRAVENOUS | Status: DC

## 2020-11-20 MED ORDER — ONDANSETRON HCL 4 MG/2ML IJ SOLN
INTRAMUSCULAR | Status: AC
  Filled 2020-11-20: qty 2

## 2020-11-20 MED ORDER — ONDANSETRON HCL 4 MG/2ML IJ SOLN
INTRAMUSCULAR | Status: DC | PRN
  Administered 2020-11-20: 4 mg via INTRAVENOUS

## 2020-11-20 MED ORDER — GABAPENTIN 100 MG PO CAPS
200.0000 mg | ORAL_CAPSULE | Freq: Every day | ORAL | Status: DC
  Administered 2020-11-20: 200 mg via ORAL
  Filled 2020-11-20: qty 2

## 2020-11-20 MED ORDER — PROPOFOL 10 MG/ML IV BOLUS
INTRAVENOUS | Status: AC
  Filled 2020-11-20: qty 40

## 2020-11-20 MED ORDER — PHENYLEPHRINE HCL-NACL 10-0.9 MG/250ML-% IV SOLN
INTRAVENOUS | Status: AC
  Filled 2020-11-20: qty 500

## 2020-11-20 MED ORDER — CHLORHEXIDINE GLUCONATE 0.12 % MT SOLN
OROMUCOSAL | Status: AC
  Administered 2020-11-20: 15 mL
  Filled 2020-11-20: qty 15

## 2020-11-20 MED ORDER — BUPIVACAINE LIPOSOME 1.3 % IJ SUSP
INTRAMUSCULAR | Status: AC
  Filled 2020-11-20: qty 20

## 2020-11-20 MED ORDER — KETOROLAC TROMETHAMINE 30 MG/ML IJ SOLN
30.0000 mg | Freq: Once | INTRAMUSCULAR | Status: AC
  Administered 2020-11-20: 30 mg via INTRAVENOUS

## 2020-11-20 MED ORDER — OXYCODONE HCL 5 MG/5ML PO SOLN: 5.0000 mg | Freq: Once | ORAL | Status: AC | PRN

## 2020-11-20 MED ORDER — HYDROMORPHONE HCL 1 MG/ML IJ SOLN
INTRAMUSCULAR | Status: AC
  Filled 2020-11-20: qty 1

## 2020-11-20 MED ORDER — ONDANSETRON HCL 4 MG/2ML IJ SOLN: 4.0000 mg | Freq: Once | INTRAMUSCULAR | Status: DC | PRN

## 2020-11-20 MED ORDER — BISACODYL 10 MG RE SUPP: 10.0000 mg | Freq: Every day | RECTAL | Status: DC | PRN

## 2020-11-20 MED ORDER — PHENYLEPHRINE HCL-NACL 10-0.9 MG/250ML-% IV SOLN
INTRAVENOUS | Status: DC | PRN
  Administered 2020-11-20: 25 ug/min via INTRAVENOUS

## 2020-11-20 MED ORDER — FENTANYL CITRATE (PF) 250 MCG/5ML IJ SOLN
INTRAMUSCULAR | Status: DC | PRN
  Administered 2020-11-20: 100 ug via INTRAVENOUS
  Administered 2020-11-20: 50 ug via INTRAVENOUS

## 2020-11-20 MED ORDER — KETAMINE HCL 10 MG/ML IJ SOLN
INTRAMUSCULAR | Status: DC | PRN
  Administered 2020-11-20: 10 mg via INTRAVENOUS
  Administered 2020-11-20: 20 mg via INTRAVENOUS

## 2020-11-20 MED ORDER — PHOSPHATIDYLSERINE 50 MG PO CAPS: 300.0000 mg | ORAL_CAPSULE | Freq: Every day | ORAL | Status: DC

## 2020-11-20 MED ORDER — CYCLOBENZAPRINE HCL 10 MG PO TABS
10.0000 mg | ORAL_TABLET | Freq: Every day | ORAL | Status: DC
  Administered 2020-11-20: 10 mg via ORAL
  Filled 2020-11-20: qty 1

## 2020-11-20 MED ORDER — SODIUM CHLORIDE 0.9% FLUSH
3.0000 mL | Freq: Two times a day (BID) | INTRAVENOUS | Status: DC
  Administered 2020-11-20: 3 mL via INTRAVENOUS

## 2020-11-20 MED ORDER — SODIUM CHLORIDE 0.9% FLUSH: 3.0000 mL | INTRAVENOUS | Status: DC | PRN

## 2020-11-20 MED ORDER — THROMBIN 5000 UNITS EX SOLR
CUTANEOUS | Status: AC
  Filled 2020-11-20: qty 5000

## 2020-11-20 MED ORDER — PROPOFOL 10 MG/ML IV BOLUS
INTRAVENOUS | Status: DC | PRN
  Administered 2020-11-20: 120 mg via INTRAVENOUS

## 2020-11-20 MED ORDER — KCL IN DEXTROSE-NACL 20-5-0.45 MEQ/L-%-% IV SOLN: INTRAVENOUS | Status: DC

## 2020-11-20 MED ORDER — DEXAMETHASONE SODIUM PHOSPHATE 10 MG/ML IJ SOLN
INTRAMUSCULAR | Status: DC | PRN
  Administered 2020-11-20: 5 mg via INTRAVENOUS

## 2020-11-20 MED ORDER — LIDOCAINE 2% (20 MG/ML) 5 ML SYRINGE
INTRAMUSCULAR | Status: AC
  Filled 2020-11-20: qty 10

## 2020-11-20 MED ORDER — 0.9 % SODIUM CHLORIDE (POUR BTL) OPTIME
TOPICAL | Status: DC | PRN
  Administered 2020-11-20: 1000 mL

## 2020-11-20 MED ORDER — KETAMINE HCL 50 MG/5ML IJ SOSY
PREFILLED_SYRINGE | INTRAMUSCULAR | Status: AC
  Filled 2020-11-20: qty 5

## 2020-11-20 MED ORDER — KETOROLAC TROMETHAMINE 30 MG/ML IJ SOLN
INTRAMUSCULAR | Status: AC
  Filled 2020-11-20: qty 1

## 2020-11-20 MED ORDER — ASCORBIC ACID 500 MG PO TABS: 2000.0000 mg | ORAL_TABLET | Freq: Two times a day (BID) | ORAL | Status: DC | PRN

## 2020-11-20 MED ORDER — CHLORHEXIDINE GLUCONATE CLOTH 2 % EX PADS: 6.0000 | MEDICATED_PAD | Freq: Once | CUTANEOUS | Status: DC

## 2020-11-20 MED ORDER — OXYCODONE-ACETAMINOPHEN 10-325 MG PO TABS: 1.0000 | ORAL_TABLET | Freq: Four times a day (QID) | ORAL | Status: DC | PRN

## 2020-11-20 MED ORDER — ONDANSETRON HCL 4 MG PO TABS: 4.0000 mg | ORAL_TABLET | Freq: Four times a day (QID) | ORAL | Status: DC | PRN

## 2020-11-20 MED ORDER — LIDOCAINE 2% (20 MG/ML) 5 ML SYRINGE
INTRAMUSCULAR | Status: DC | PRN
  Administered 2020-11-20: 60 mg via INTRAVENOUS

## 2020-11-20 MED ORDER — PANTOPRAZOLE SODIUM 40 MG IV SOLR: 40.0000 mg | Freq: Every day | INTRAVENOUS | Status: DC

## 2020-11-20 MED ORDER — ZOLPIDEM TARTRATE 5 MG PO TABS
5.0000 mg | ORAL_TABLET | Freq: Every evening | ORAL | Status: DC | PRN
  Administered 2020-11-20: 5 mg via ORAL
  Filled 2020-11-20: qty 1

## 2020-11-20 MED ORDER — LACTATED RINGERS IV SOLN: INTRAVENOUS | Status: DC | PRN

## 2020-11-20 MED ORDER — ACETAMINOPHEN 160 MG/5ML PO SOLN: 325.0000 mg | ORAL | Status: DC | PRN

## 2020-11-20 MED ORDER — TRAMADOL HCL 50 MG PO TABS: 50.0000 mg | ORAL_TABLET | Freq: Two times a day (BID) | ORAL | Status: DC | PRN

## 2020-11-20 MED ORDER — PHENYLEPHRINE 40 MCG/ML (10ML) SYRINGE FOR IV PUSH (FOR BLOOD PRESSURE SUPPORT)
PREFILLED_SYRINGE | INTRAVENOUS | Status: DC | PRN
  Administered 2020-11-20 (×2): 80 ug via INTRAVENOUS

## 2020-11-20 MED ORDER — ROCURONIUM BROMIDE 10 MG/ML (PF) SYRINGE
PREFILLED_SYRINGE | INTRAVENOUS | Status: DC | PRN
  Administered 2020-11-20: 60 mg via INTRAVENOUS
  Administered 2020-11-20 (×3): 20 mg via INTRAVENOUS

## 2020-11-20 MED ORDER — ACETAMINOPHEN 10 MG/ML IV SOLN
INTRAVENOUS | Status: AC
  Administered 2020-11-20: 1000 mg
  Filled 2020-11-20: qty 100

## 2020-11-20 MED ORDER — ACETAMINOPHEN 325 MG PO TABS: 650.0000 mg | ORAL_TABLET | ORAL | Status: DC | PRN

## 2020-11-20 MED ORDER — ALUM & MAG HYDROXIDE-SIMETH 200-200-20 MG/5ML PO SUSP: 30.0000 mL | Freq: Four times a day (QID) | ORAL | Status: DC | PRN

## 2020-11-20 MED ORDER — VITAMIN D3 125 MCG (5000 UT) PO CAPS: 10000.0000 [IU] | ORAL_CAPSULE | Freq: Every evening | ORAL | Status: DC

## 2020-11-20 MED ORDER — CEFAZOLIN SODIUM-DEXTROSE 2-4 GM/100ML-% IV SOLN
2.0000 g | Freq: Three times a day (TID) | INTRAVENOUS | Status: AC
  Administered 2020-11-20 (×2): 2 g via INTRAVENOUS
  Filled 2020-11-20 (×2): qty 100

## 2020-11-20 MED ORDER — DOXEPIN HCL 10 MG PO CAPS
10.0000 mg | ORAL_CAPSULE | Freq: Every evening | ORAL | Status: DC | PRN
  Administered 2020-11-20: 10 mg via ORAL
  Filled 2020-11-20 (×4): qty 1

## 2020-11-20 MED ORDER — FLEET ENEMA 7-19 GM/118ML RE ENEM: 1.0000 | ENEMA | Freq: Once | RECTAL | Status: DC | PRN

## 2020-11-20 MED ORDER — FENTANYL CITRATE (PF) 250 MCG/5ML IJ SOLN
INTRAMUSCULAR | Status: AC
  Filled 2020-11-20: qty 5

## 2020-11-20 MED ORDER — ACETAMINOPHEN 325 MG PO TABS: 325.0000 mg | ORAL_TABLET | ORAL | Status: DC | PRN

## 2020-11-20 MED ORDER — LIDOCAINE-EPINEPHRINE 1 %-1:100000 IJ SOLN
INTRAMUSCULAR | Status: DC | PRN
  Administered 2020-11-20: 5 mL

## 2020-11-20 MED ORDER — MIDAZOLAM HCL 2 MG/2ML IJ SOLN
INTRAMUSCULAR | Status: DC | PRN
  Administered 2020-11-20: 2 mg via INTRAVENOUS

## 2020-11-20 MED ORDER — ACETAMINOPHEN 650 MG RE SUPP: 650.0000 mg | RECTAL | Status: DC | PRN

## 2020-11-20 MED ORDER — LIDOCAINE-EPINEPHRINE 1 %-1:100000 IJ SOLN
INTRAMUSCULAR | Status: AC
  Filled 2020-11-20: qty 1

## 2020-11-20 MED ORDER — HYDROCODONE-ACETAMINOPHEN 5-325 MG PO TABS
2.0000 | ORAL_TABLET | ORAL | Status: DC | PRN
  Administered 2020-11-20 – 2020-11-21 (×5): 2 via ORAL
  Filled 2020-11-20 (×5): qty 2

## 2020-11-20 MED ORDER — DEXAMETHASONE SODIUM PHOSPHATE 10 MG/ML IJ SOLN
INTRAMUSCULAR | Status: AC
  Filled 2020-11-20: qty 1

## 2020-11-20 MED ORDER — MEPERIDINE HCL 25 MG/ML IJ SOLN: 6.2500 mg | INTRAMUSCULAR | Status: DC | PRN

## 2020-11-20 MED ORDER — OXYCODONE HCL 5 MG PO TABS: 5.0000 mg | ORAL_TABLET | ORAL | Status: DC | PRN

## 2020-11-20 MED ORDER — BUPIVACAINE LIPOSOME 1.3 % IJ SUSP
INTRAMUSCULAR | Status: DC | PRN
  Administered 2020-11-20: 20 mL

## 2020-11-20 MED ORDER — HYDROMORPHONE HCL 1 MG/ML IJ SOLN
INTRAMUSCULAR | Status: AC
  Administered 2020-11-20: 0.5 mg
  Filled 2020-11-20: qty 1

## 2020-11-20 MED ORDER — FENTANYL CITRATE (PF) 100 MCG/2ML IJ SOLN: 25.0000 ug | INTRAMUSCULAR | Status: DC | PRN

## 2020-11-20 MED ORDER — CEFAZOLIN SODIUM-DEXTROSE 2-4 GM/100ML-% IV SOLN
2.0000 g | INTRAVENOUS | Status: AC
  Administered 2020-11-20: 2 g via INTRAVENOUS
  Filled 2020-11-20: qty 100

## 2020-11-20 MED ORDER — LIDOCAINE 2% (20 MG/ML) 5 ML SYRINGE
INTRAMUSCULAR | Status: AC
  Filled 2020-11-20: qty 5

## 2020-11-20 SURGICAL SUPPLY — 73 items
BAG COUNTER SPONGE SURGICOUNT (BAG) ×6 IMPLANT
BASKET BONE COLLECTION (BASKET) ×3 IMPLANT
BLADE CLIPPER SURG (BLADE) IMPLANT
BONE CANC CHIPS 20CC PCAN1/4 (Bone Implant) ×3 IMPLANT
BUR MATCHSTICK NEURO 3.0 LAGG (BURR) ×3 IMPLANT
BUR PRECISION FLUTE 5.0 (BURR) ×3 IMPLANT
CAGE MAS PLIF 9X9X23-8 LUMBAR (Cage) ×6 IMPLANT
CANISTER SUCT 3000ML PPV (MISCELLANEOUS) ×3 IMPLANT
CARTRIDGE OIL MAESTRO DRILL (MISCELLANEOUS) ×2 IMPLANT
CHIPS CANC BONE 20CC PCAN1/4 (Bone Implant) ×2 IMPLANT
CNTNR URN SCR LID CUP LEK RST (MISCELLANEOUS) ×2 IMPLANT
CONT SPEC 4OZ STRL OR WHT (MISCELLANEOUS) ×3
COVER BACK TABLE 60X90IN (DRAPES) ×3 IMPLANT
DERMABOND ADVANCED (GAUZE/BANDAGES/DRESSINGS) ×1
DERMABOND ADVANCED .7 DNX12 (GAUZE/BANDAGES/DRESSINGS) ×2 IMPLANT
DIFFUSER DRILL AIR PNEUMATIC (MISCELLANEOUS) ×3 IMPLANT
DRAPE C-ARM 42X72 X-RAY (DRAPES) ×3 IMPLANT
DRAPE C-ARMOR (DRAPES) ×3 IMPLANT
DRAPE LAPAROTOMY 100X72X124 (DRAPES) ×3 IMPLANT
DRAPE PULSE TABLE ARRAY (DRAPES) ×3 IMPLANT
DRAPE SURG 17X23 STRL (DRAPES) ×3 IMPLANT
DRSG OPSITE POSTOP 4X8 (GAUZE/BANDAGES/DRESSINGS) ×3 IMPLANT
DURAPREP 26ML APPLICATOR (WOUND CARE) ×3 IMPLANT
ELECT BLADE 4.0 EZ CLEAN MEGAD (MISCELLANEOUS) ×3
ELECT REM PT RETURN 9FT ADLT (ELECTROSURGICAL) ×3
ELECTRODE BLDE 4.0 EZ CLN MEGD (MISCELLANEOUS) ×2 IMPLANT
ELECTRODE REM PT RTRN 9FT ADLT (ELECTROSURGICAL) ×2 IMPLANT
EVACUATOR 1/8 PVC DRAIN (DRAIN) IMPLANT
GAUZE 4X4 16PLY ~~LOC~~+RFID DBL (SPONGE) ×3 IMPLANT
GAUZE SPONGE 4X4 12PLY STRL (GAUZE/BANDAGES/DRESSINGS) ×3 IMPLANT
GLOVE EXAM NITRILE XL STR (GLOVE) IMPLANT
GLOVE SRG 8 PF TXTR STRL LF DI (GLOVE) ×4 IMPLANT
GLOVE SURG ENC MOIS LTX SZ8 (GLOVE) ×6 IMPLANT
GLOVE SURG LTX SZ8 (GLOVE) ×6 IMPLANT
GLOVE SURG UNDER POLY LF SZ8 (GLOVE) ×6
GLOVE SURG UNDER POLY LF SZ8.5 (GLOVE) ×6 IMPLANT
GOWN STRL REUS W/ TWL LRG LVL3 (GOWN DISPOSABLE) IMPLANT
GOWN STRL REUS W/ TWL XL LVL3 (GOWN DISPOSABLE) ×4 IMPLANT
GOWN STRL REUS W/TWL 2XL LVL3 (GOWN DISPOSABLE) ×6 IMPLANT
GOWN STRL REUS W/TWL LRG LVL3 (GOWN DISPOSABLE)
GOWN STRL REUS W/TWL XL LVL3 (GOWN DISPOSABLE) ×6
HEMOSTAT POWDER KIT SURGIFOAM (HEMOSTASIS) ×6 IMPLANT
KIT BASIN OR (CUSTOM PROCEDURE TRAY) ×3 IMPLANT
KIT INFUSE XX SMALL 0.7CC (Orthopedic Implant) ×3 IMPLANT
KIT POSITION SURG JACKSON T1 (MISCELLANEOUS) ×3 IMPLANT
KIT TURNOVER KIT B (KITS) ×3 IMPLANT
MILL MEDIUM DISP (BLADE) ×3 IMPLANT
NEEDLE HYPO 21X1.5 SAFETY (NEEDLE) ×3 IMPLANT
NEEDLE HYPO 25X1 1.5 SAFETY (NEEDLE) ×3 IMPLANT
NEEDLE SPNL 18GX3.5 QUINCKE PK (NEEDLE) IMPLANT
NS IRRIG 1000ML POUR BTL (IV SOLUTION) ×3 IMPLANT
OIL CARTRIDGE MAESTRO DRILL (MISCELLANEOUS) ×3
PACK LAMINECTOMY NEURO (CUSTOM PROCEDURE TRAY) ×3 IMPLANT
PATTIES SURGICAL .5 X.5 (GAUZE/BANDAGES/DRESSINGS) IMPLANT
PATTIES SURGICAL .5 X1 (DISPOSABLE) IMPLANT
PATTIES SURGICAL 1X1 (DISPOSABLE) IMPLANT
ROD RELINE-O LORD 5.5X40 (Rod) ×6 IMPLANT
RUBBER BAND PULSE STRL (MISCELLANEOUS) ×3 IMPLANT
SCREW LOCK RELINE 5.5 TULIP (Screw) ×12 IMPLANT
SCREW RELINE-O POLY 6.5X45 (Screw) ×12 IMPLANT
SPONGE SURGIFOAM ABS GEL 100 (HEMOSTASIS) IMPLANT
SPONGE T-LAP 4X18 ~~LOC~~+RFID (SPONGE) ×3 IMPLANT
STAPLER SKIN PROX WIDE 3.9 (STAPLE) ×3 IMPLANT
SUT VIC AB 1 CT1 18XBRD ANBCTR (SUTURE) ×2 IMPLANT
SUT VIC AB 1 CT1 8-18 (SUTURE) ×3
SUT VIC AB 2-0 CT1 18 (SUTURE) ×3 IMPLANT
SUT VIC AB 3-0 SH 8-18 (SUTURE) ×6 IMPLANT
SYR 20CC LL (SYRINGE) ×3 IMPLANT
SYR 5ML LL (SYRINGE) IMPLANT
TOWEL GREEN STERILE (TOWEL DISPOSABLE) ×3 IMPLANT
TOWEL GREEN STERILE FF (TOWEL DISPOSABLE) ×3 IMPLANT
TRAY FOLEY MTR SLVR 16FR STAT (SET/KITS/TRAYS/PACK) ×3 IMPLANT
WATER STERILE IRR 1000ML POUR (IV SOLUTION) ×3 IMPLANT

## 2020-11-20 NOTE — Transfer of Care (Signed)
Immediate Anesthesia Transfer of Care Note  Patient: Rebecca Marks  Procedure(s) Performed: Lumbar four-five Posterior lumbar interbody fusion (Spine Lumbar)  Patient Location: PACU  Anesthesia Type:General  Level of Consciousness: drowsy  Airway & Oxygen Therapy: Patient Spontanous Breathing and Patient connected to face mask oxygen  Post-op Assessment: Report given to RN and Post -op Vital signs reviewed and stable  Post vital signs: Reviewed and stable  Last Vitals:  Vitals Value Taken Time  BP    Temp    Pulse 76 11/20/20 1034  Resp 11 11/20/20 1034  SpO2 100 % 11/20/20 1034  Vitals shown include unvalidated device data.  Last Pain:  Vitals:   11/20/20 0559  TempSrc: Oral  PainSc: 5       Patients Stated Pain Goal: 0 (66/06/30 1601)  Complications: No notable events documented.

## 2020-11-20 NOTE — Anesthesia Postprocedure Evaluation (Signed)
Anesthesia Post Note  Patient: Rebecca Marks  Procedure(s) Performed: Lumbar four-five Posterior lumbar interbody fusion (Spine Lumbar) APPLICATION OF PULSE     Patient location during evaluation: PACU Anesthesia Type: General Level of consciousness: awake and alert Pain management: pain level controlled Vital Signs Assessment: post-procedure vital signs reviewed and stable Respiratory status: spontaneous breathing, nonlabored ventilation, respiratory function stable and patient connected to nasal cannula oxygen Cardiovascular status: blood pressure returned to baseline and stable Postop Assessment: no apparent nausea or vomiting Anesthetic complications: no   No notable events documented.  Last Vitals:  Vitals:   11/20/20 1633 11/20/20 1910  BP: 101/64 106/62  Pulse: 74 78  Resp: 18 16  Temp: 36.4 C 36.7 C  SpO2: 100% 100%    Last Pain:  Vitals:   11/20/20 1910  TempSrc: Oral  PainSc:                  Sanjna Haskew

## 2020-11-20 NOTE — Op Note (Signed)
11/20/2020  10:34 AM  PATIENT:  Rebecca Marks  65 y.o. female  PRE-OPERATIVE DIAGNOSIS:  Lumbar stenosis with neurogenic claudication, lumbar spondylolisthesis, lumbago, lumbar radiculopathy L 45 level  POST-OPERATIVE DIAGNOSIS:  Lumbar stenosis with neurogenic claudication, lumbar spondylolisthesis, lumbago, lumbar radiculopathy L 45 level   PROCEDURE:  Procedure(s): Lumbar four-five Posterior lumbar interbody fusion (N/A) with PEEK cages, autograft, pedicle screw fixation, posterolateral arthrodesis  SURGEON:  Surgeon(s) and Role:    Erline Levine, MD - Primary  PHYSICIAN ASSISTANT: Glenford Peers, NP  ASSISTANTS: Poteat, RN   ANESTHESIA:   general  EBL:  300 mL   BLOOD ADMINISTERED:none  DRAINS: none   LOCAL MEDICATIONS USED:  MARCAINE    and LIDOCAINE   SPECIMEN:  No Specimen  DISPOSITION OF SPECIMEN:  N/A  COUNTS:  YES  TOURNIQUET:  * No tourniquets in log *  DICTATION: Patient is 65 year old woman with 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 She has a severe bilateral L 5 radiculopathies. It was elected to take her to surgery for decompression and fusion at this level.   Procedure: Patient was placed in a prone position on the [place] table after smooth and uncomplicated induction of general endotracheal anesthesia. Her low back was prepped and draped in usual sterile fashion with betadine scrub and DuraPrep after preop localization of appropriate bony anatomy with Pulse.  Area of incision was infiltrated with local lidocaine. Incision was made to the lumbodorsal fascia was incised and exposure was performed of the L4 through L5 spinous processes laminae facet joint and transverse processes. Intraoperative x-ray was obtained which confirmed correct orientation. A total laminectomy of L4 was performed with disarticulation of the facet joints at this level and thorough decompression was performed of both L4 and L5 nerve roots along with the common dural  tube. This decompression was more involved than would be typical of that performed for PLIF alone and included painstaking dissection of adherent ligament compressing the thecal sac and wide decompression of all neural elements. A thorough discectomy was initially performed on the left with preparation of the endplates for grafting a trial spacer was placed this level and a thorough discectomy was performed on the right as well.  Bilateral 9 x 9 x 23 mm x 8 degree peek cages were packed with extra extra small BMP and autograft and were inserted the interspace and countersunk appropriately along with 10 cc of morselized bone autograft. The posterolateral region was extensively decorticated and pedicle probes were placed at L4 and L5 bilaterally. Intraoperative fluoroscopy confirmed correct orientationin the AP and lateral plane. 45 x 6.5 mm pedicle screws were placed at L5 bilaterally and 45 x 6.5 mm screws placed at L4 bilaterally final x-rays demonstrated well-positioned interbody grafts and pedicle screw fixation. A 40 mm lordotic rod was placed on the right and a 40 mm rod was placed on the left locked down in situ and the posterolateral region was packed with 20 cc bone allograft on the right. The wound was irrigated.  Long-acting Marcaine was injected in the deep musculature.   Fascia was closed with 1 Vicryl sutures skin edges were reapproximated 2 and 3-0 Vicryl sutures. The wound is dressed with Dermabond and an occlusive dressing.   the patient was extubated in the operating room and taken to recovery in stable satisfactory condition. She tolerated the operation well counts were correct at the end of the case.   PLAN OF CARE: Admit to inpatient   PATIENT DISPOSITION:  PACU -  hemodynamically stable.   Delay start of Pharmacological VTE agent (>24hrs) due to surgical blood loss or risk of bleeding: yes

## 2020-11-20 NOTE — Brief Op Note (Signed)
11/20/2020  10:34 AM  PATIENT:  Rebecca Marks  65 y.o. female  PRE-OPERATIVE DIAGNOSIS:  Lumbar stenosis with neurogenic claudication, lumbar spondylolisthesis, lumbago, lumbar radiculopathy L 45 level  POST-OPERATIVE DIAGNOSIS:  Lumbar stenosis with neurogenic claudication, lumbar spondylolisthesis, lumbago, lumbar radiculopathy L 45 level   PROCEDURE:  Procedure(s): Lumbar four-five Posterior lumbar interbody fusion (N/A) with PEEK cages, autograft, pedicle screw fixation, posterolateral arthrodesis  SURGEON:  Surgeon(s) and Role:    Erline Levine, MD - Primary  PHYSICIAN ASSISTANT: Glenford Peers, NP  ASSISTANTS: Poteat, RN   ANESTHESIA:   general  EBL:  300 mL   BLOOD ADMINISTERED:none  DRAINS: none   LOCAL MEDICATIONS USED:  MARCAINE    and LIDOCAINE   SPECIMEN:  No Specimen  DISPOSITION OF SPECIMEN:  N/A  COUNTS:  YES  TOURNIQUET:  * No tourniquets in log *  DICTATION: Patient is 65 year old woman with 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 She has a severe bilateral L 5 radiculopathies. It was elected to take her to surgery for decompression and fusion at this level.   Procedure: Patient was placed in a prone position on the [place] table after smooth and uncomplicated induction of general endotracheal anesthesia. Her low back was prepped and draped in usual sterile fashion with betadine scrub and DuraPrep after preop localization of appropriate bony anatomy with Pulse.  Area of incision was infiltrated with local lidocaine. Incision was made to the lumbodorsal fascia was incised and exposure was performed of the L4 through L5 spinous processes laminae facet joint and transverse processes. Intraoperative x-ray was obtained which confirmed correct orientation. A total laminectomy of L4 was performed with disarticulation of the facet joints at this level and thorough decompression was performed of both L4 and L5 nerve roots along with the common dural  tube. This decompression was more involved than would be typical of that performed for PLIF alone and included painstaking dissection of adherent ligament compressing the thecal sac and wide decompression of all neural elements. A thorough discectomy was initially performed on the left with preparation of the endplates for grafting a trial spacer was placed this level and a thorough discectomy was performed on the right as well.  Bilateral 9 x 9 x 23 mm x 8 degree peek cages were packed with extra extra small BMP and autograft and were inserted the interspace and countersunk appropriately along with 10 cc of morselized bone autograft. The posterolateral region was extensively decorticated and pedicle probes were placed at L4 and L5 bilaterally. Intraoperative fluoroscopy confirmed correct orientationin the AP and lateral plane. 45 x 6.5 mm pedicle screws were placed at L5 bilaterally and 45 x 6.5 mm screws placed at L4 bilaterally final x-rays demonstrated well-positioned interbody grafts and pedicle screw fixation. A 40 mm lordotic rod was placed on the right and a 40 mm rod was placed on the left locked down in situ and the posterolateral region was packed with 20 cc bone allograft on the right. The wound was irrigated.  Long-acting Marcaine was injected in the deep musculature.   Fascia was closed with 1 Vicryl sutures skin edges were reapproximated 2 and 3-0 Vicryl sutures. The wound is dressed with Dermabond and an occlusive dressing.   the patient was extubated in the operating room and taken to recovery in stable satisfactory condition. She tolerated the operation well counts were correct at the end of the case.   PLAN OF CARE: Admit to inpatient   PATIENT DISPOSITION:  PACU -  hemodynamically stable.   Delay start of Pharmacological VTE agent (>24hrs) due to surgical blood loss or risk of bleeding: yes

## 2020-11-20 NOTE — Progress Notes (Signed)
Abstracted Mammogram done @ [facility] 11/15/20

## 2020-11-20 NOTE — Anesthesia Procedure Notes (Signed)
Procedure Name: Intubation Date/Time: 11/20/2020 7:38 AM Performed by: Harden Mo, CRNA Pre-anesthesia Checklist: Patient identified, Emergency Drugs available, Suction available and Patient being monitored Patient Re-evaluated:Patient Re-evaluated prior to induction Oxygen Delivery Method: Circle System Utilized Preoxygenation: Pre-oxygenation with 100% oxygen Induction Type: IV induction Ventilation: Mask ventilation without difficulty and Oral airway inserted - appropriate to patient size Laryngoscope Size: Mac and 3 Grade View: Grade I Tube type: Oral Tube size: 7.0 mm Number of attempts: 1 Airway Equipment and Method: Stylet and Oral airway Placement Confirmation: ETT inserted through vocal cords under direct vision, positive ETCO2 and breath sounds checked- equal and bilateral Secured at: 21 cm Tube secured with: Tape Dental Injury: Teeth and Oropharynx as per pre-operative assessment  Comments: Placed by Rexford Maus, SRNA

## 2020-11-20 NOTE — Interval H&P Note (Signed)
History and Physical Interval Note:  11/20/2020 7:29 AM  Rebecca Marks  has presented today for surgery, with the diagnosis of Lumbar stenosis with neurogenic claudication.  The various methods of treatment have been discussed with the patient and family. After consideration of risks, benefits and other options for treatment, the patient has consented to  Procedure(s) with comments: Lumbar 4-5 Posterior lumbar interbody fusion (N/A) - 3C/RM 21 as a surgical intervention.  The patient's history has been reviewed, patient examined, no change in status, stable for surgery.  I have reviewed the patient's chart and labs.  Questions were answered to the patient's satisfaction.     Peggyann Shoals

## 2020-11-21 MED ORDER — OXYCODONE-ACETAMINOPHEN 5-325 MG PO TABS: 1.0000 | ORAL_TABLET | ORAL | 0 refills | Status: DC | PRN

## 2020-11-21 MED ORDER — METHOCARBAMOL 500 MG PO TABS: 500.0000 mg | ORAL_TABLET | Freq: Four times a day (QID) | ORAL | 0 refills | Status: DC | PRN

## 2020-11-21 MED ORDER — METHOCARBAMOL 500 MG PO TABS
500.0000 mg | ORAL_TABLET | Freq: Four times a day (QID) | ORAL | Status: DC | PRN
  Administered 2020-11-21 (×2): 500 mg via ORAL
  Filled 2020-11-21 (×2): qty 1

## 2020-11-21 MED FILL — Thrombin For Soln 5000 Unit: CUTANEOUS | Qty: 5000 | Status: AC

## 2020-11-21 NOTE — Discharge Summary (Signed)
Physician Discharge Summary  Patient ID: Rebecca Marks MRN: [id number] DOB/AGE: 1956/01/05 65 y.o.  Admit date: 11/20/2020 Discharge date: 11/21/2020  Admission Diagnoses: Lumbar stenosis with neurogenic claudication, lumbar spondylolisthesis, lumbago, lumbar radiculopathy L 45 level  Discharge Diagnoses: Lumbar stenosis with neurogenic claudication, lumbar spondylolisthesis, lumbago, lumbar radiculopathy L 45 level Active Problems:   Spondylolisthesis of lumbar region   Discharged Condition: good  Hospital Course: The patient was admitted on 11/20/2020 and taken to the operating room where the patient underwent decompression and fusion at the L4-5 level. The patient tolerated the procedure well and was taken to the recovery room and then to the floor in stable condition. The hospital course was routine. There were no complications. The wound remained clean dry and intact. Pt had appropriate back soreness. No complaints of leg pain or new N/T/W. The patient remained afebrile with stable vital signs, and tolerated a regular diet. The patient continued to increase activities, and pain was well controlled with oral pain medications.   Consults: None  Significant Diagnostic Studies: radiology: X-Ray: intraoperative   Treatments: surgery: Lumbar four-five Posterior lumbar interbody fusion (N/A) with PEEK cages, autograft, pedicle screw fixation, posterolateral arthrodesis  Discharge Exam: Blood pressure 112/68, pulse 73, temperature 98.1 F (36.7 C), temperature source Oral, resp. rate 16, height 5' 6.25" (1.683 m), weight 58.4 kg, SpO2 97 %.  Physical Exam: Patient is awake, A/O X 4, conversant, and in good spirits. Speech is fluent and appropriate. Doing well. MAEW with good strength that is symmetric bilaterally. 5/5 BUE/BLE.   Dressing is clean dry intact. Incision is well approximated with no drainage, erythema, or edema.  LSO brace in place   Disposition: Discharge disposition:  01-Home or Self Care        Allergies as of 11/21/2020       Reactions   Ritalin [methylphenidate Hcl]    Too hyper        Medication List     STOP taking these medications    cyclobenzaprine 10 MG tablet Commonly known as: FLEXERIL   HYDROcodone-acetaminophen 5-325 MG tablet Commonly known as: NORCO/VICODIN   oxyCODONE-acetaminophen 10-325 MG tablet Commonly known as: PERCOCET Replaced by: oxyCODONE-acetaminophen 5-325 MG tablet   traMADol 50 MG tablet Commonly known as: ULTRAM       TAKE these medications    CORAL CALCIUM PO Take 1 Dose by mouth. Ionic Calcium (Mixed in bottle of water--sand formulation)   methocarbamol 500 MG tablet Commonly known as: ROBAXIN Take 1 tablet (500 mg total) by mouth every 6 (six) hours as needed for muscle spasms.   oxyCODONE-acetaminophen 5-325 MG tablet Commonly known as: Percocet Take 1-2 tablets by mouth every 4 (four) hours as needed for severe pain. Replaces: oxyCODONE-acetaminophen 10-325 MG tablet   PHOSPHATIDYLSERINE PO Take 300 mg by mouth at bedtime.   vitamin C 1000 MG tablet Take 2,000 mg by mouth 2 (two) times daily as needed (immune system support).   Vitamin D3 125 MCG (5000 UT) Caps Take 10,000 Units by mouth every evening.   Voltaren 1 % Gel Generic drug: diclofenac Sodium Apply 1 application topically in the morning and at bedtime. Applied to both knees and lower back   zaleplon 10 MG capsule Commonly known as: SONATA Take 1 capsule (10 mg total) by mouth at bedtime as needed for sleep.       ASK your doctor about these medications    doxepin 10 MG capsule Commonly known as: SINEQUAN TAKE 1 CAPSULE BY MOUTH EVERY DAY AT  BEDTIME AS NEEDED   gabapentin 100 MG capsule Commonly known as: NEURONTIN Take 3 capsules (300 mg total) by mouth at bedtime.   varenicline 1 MG tablet Commonly known as: CHANTIX Take 1 tablet (1 mg total) by mouth daily.         Signed: Marvis Moeller,  DNP, NP-C 11/21/2020, 8:56 AM

## 2020-11-21 NOTE — Discharge Instructions (Signed)

## 2020-11-21 NOTE — Progress Notes (Signed)
NURSING PROGRESS NOTE  Rebecca Marks [id number] Discharge Data: 11/21/2020 1:14 PM Attending Provider: Erline Levine, MD [id number], Evie Lacks, MD     Cydney Ok to be D/C'd Home per MD order.  Discussed with the patient the After Visit Summary and all questions fully answered. All IV's discontinued with no bleeding noted. All belongings returned to patient for patient to take home.   Last Vital Signs:  Blood pressure 112/68, pulse 73, temperature 98.1 F (36.7 C), temperature source Oral, resp. rate 16, height 5' 6.25" (1.683 m), weight 58.4 kg, SpO2 97 %.  Discharge Medication List Allergies as of 11/21/2020       Reactions   Ritalin [methylphenidate Hcl]    Too hyper        Medication List     STOP taking these medications    cyclobenzaprine 10 MG tablet Commonly known as: FLEXERIL   HYDROcodone-acetaminophen 5-325 MG tablet Commonly known as: NORCO/VICODIN   oxyCODONE-acetaminophen 10-325 MG tablet Commonly known as: PERCOCET Replaced by: oxyCODONE-acetaminophen 5-325 MG tablet   traMADol 50 MG tablet Commonly known as: ULTRAM       TAKE these medications    CORAL CALCIUM PO Take 1 Dose by mouth. Ionic Calcium (Mixed in bottle of water--sand formulation)   methocarbamol 500 MG tablet Commonly known as: ROBAXIN Take 1 tablet (500 mg total) by mouth every 6 (six) hours as needed for muscle spasms.   oxyCODONE-acetaminophen 5-325 MG tablet Commonly known as: Percocet Take 1-2 tablets by mouth every 4 (four) hours as needed for severe pain. Replaces: oxyCODONE-acetaminophen 10-325 MG tablet   PHOSPHATIDYLSERINE PO Take 300 mg by mouth at bedtime.   vitamin C 1000 MG tablet Take 2,000 mg by mouth 2 (two) times daily as needed (immune system support).   Vitamin D3 125 MCG (5000 UT) Caps Take 10,000 Units by mouth every evening.   Voltaren 1 % Gel Generic drug: diclofenac Sodium Apply 1 application topically in the morning and at bedtime.  Applied to both knees and lower back   zaleplon 10 MG capsule Commonly known as: SONATA Take 1 capsule (10 mg total) by mouth at bedtime as needed for sleep.       ASK your doctor about these medications    doxepin 10 MG capsule Commonly known as: SINEQUAN TAKE 1 CAPSULE BY MOUTH EVERY DAY AT BEDTIME AS NEEDED   gabapentin 100 MG capsule Commonly known as: NEURONTIN Take 3 capsules (300 mg total) by mouth at bedtime.   varenicline 1 MG tablet Commonly known as: CHANTIX Take 1 tablet (1 mg total) by mouth daily.

## 2020-11-21 NOTE — Progress Notes (Signed)
Subjective: Patient reports that she has had a resolution of her BLE pain and only reports appropriate incisional discomfort. No acute events overnight.   Objective: Vital signs in last 24 hours: Temp:  [97.3 F (36.3 C)-98.1 F (36.7 C)] 98.1 F (36.7 C) (07/06 0730) Pulse Rate:  [71-81] 73 (07/06 0730) Resp:  [12-20] 16 (07/06 0730) BP: (90-134)/(50-109) 112/68 (07/06 0730) SpO2:  [97 %-100 %] 97 % (07/06 0730)  Intake/Output from previous day: 07/05 0701 - 07/06 0700 In: 1340 [P.O.:240; I.V.:1000; IV Piggyback:100] Out: 950 [Urine:650; Blood:300] Intake/Output this shift: No intake/output data recorded.  Physical Exam: Patient is awake, A/O X 4, conversant, and in good spirits. Speech is fluent and appropriate. Doing well. MAEW with good strength that is symmetric bilaterally. 5/5 BUE/BLE.   Dressing is clean dry intact. Incision is well approximated with no drainage, erythema, or edema.  LSO brace in place   Lab Results: No results for input(s): WBC, HGB, HCT, PLT in the last 72 hours. BMET No results for input(s): NA, K, CL, CO2, GLUCOSE, BUN, CREATININE, CALCIUM in the last 72 hours.  Studies/Results: DG Lumbar Spine 2-3 Views  Result Date: 11/20/2020 CLINICAL DATA:  Provided history: Elective surgery. Additional history provided: Lumbar 4-5 posterior lumbar interbody fusion. Provided fluoroscopy time 55 seconds (24.83 mGy). EXAM: LUMBAR SPINE - 2-3 VIEW; DG C-ARM 1-60 MIN COMPARISON:  Lumbar spine MRI 08/28/2020. Lumbar spine radiographs 09/10/2020. FINDINGS: P and lateral view intraoperative fluoroscopic images of the lumbar spine are submitted, 2 images total. The lowest well-formed intervertebral disc space is designated L5-S1. On the provided images, bilateral pedicle screws are present at L4 and L5. Vertical interconnecting rods were not present at the time the images were taken. Interbody device(s) also present at L4-L5. IMPRESSION: Three intraoperative fluoroscopic  images of the lumbar spine from L4-L5 posterior fusion, as described. Electronically Signed   By: Kellie Simmering DO   On: 11/20/2020 10:24   DG C-Arm 1-60 Min  Result Date: 11/20/2020 CLINICAL DATA:  Provided history: Elective surgery. Additional history provided: Lumbar 4-5 posterior lumbar interbody fusion. Provided fluoroscopy time 55 seconds (24.83 mGy). EXAM: LUMBAR SPINE - 2-3 VIEW; DG C-ARM 1-60 MIN COMPARISON:  Lumbar spine MRI 08/28/2020. Lumbar spine radiographs 09/10/2020. FINDINGS: P and lateral view intraoperative fluoroscopic images of the lumbar spine are submitted, 2 images total. The lowest well-formed intervertebral disc space is designated L5-S1. On the provided images, bilateral pedicle screws are present at L4 and L5. Vertical interconnecting rods were not present at the time the images were taken. Interbody device(s) also present at L4-L5. IMPRESSION: Three intraoperative fluoroscopic images of the lumbar spine from L4-L5 posterior fusion, as described. Electronically Signed   By: Kellie Simmering DO   On: 11/20/2020 10:24    Assessment/Plan: Patient is post-op day 1 s/p decompression and fusion at the L4-5 level. She is recovering well and reports a resolution of her preoperative symptoms.  Her only complaint is mild incisional discomfort.  She has ambulated with nursing staff and is awaiting PT/OT evaluation.  Continue LSO brace when OOB. Continue working on pain control, mobility and ambulating patient. Will plan for discharge today.     LOS: 1 day     Marvis Moeller, DNP, NP-C 11/21/2020, 8:21 AM

## 2020-11-21 NOTE — Evaluation (Signed)
Occupational Therapy Evaluation Patient Details Name: Rebecca Marks MRN: [id number] DOB: 09-16-1955 Today's Date: 11/21/2020    History of Present Illness 65 yo f s/p L4-5 PLIF.  PMH none given, unremarkable.   Clinical Impression   Patient admitted for the diagnosis and procedure above.  She lives with her spouse who can assist, but she is able to complete all mobility and ADL without assist.  No further needs in the acute setting.  All questions answered, and patient verbalized understanding of all precautions.      Follow Up Recommendations  No OT follow up    Equipment Recommendations  Other (comment) ([place] reacher)    Recommendations for Other Services       Precautions / Restrictions Precautions Precautions: Back Precaution Booklet Issued: Yes (comment) Precaution Comments: reviewed with patient, verbalized understanding. Required Braces or Orthoses: Spinal Brace Spinal Brace: Lumbar corset;Applied in standing position Restrictions Weight Bearing Restrictions: No      Mobility Bed Mobility Overal bed mobility: Modified Independent               Patient Response: Cooperative  Transfers Overall transfer level: Independent                    Balance Overall balance assessment: No apparent balance deficits (not formally assessed)                                         ADL either performed or assessed with clinical judgement   ADL Overall ADL's : At baseline                                       General ADL Comments: Patient was dressed upon entering.  No deficits noted and able to figure four sit.     Vision Patient Visual Report: No change from baseline       Perception     Praxis      Pertinent Vitals/Pain Pain Assessment: Faces Faces Pain Scale: Hurts a little bit Pain Location: incision Pain Descriptors / Indicators: Aching Pain Intervention(s): Monitored during session     Hand Dominance  Right   Extremity/Trunk Assessment Upper Extremity Assessment Upper Extremity Assessment: Overall WFL for tasks assessed   Lower Extremity Assessment Lower Extremity Assessment: Overall WFL for tasks assessed   Cervical / Trunk Assessment Cervical / Trunk Assessment: Other exceptions Cervical / Trunk Exceptions: spine surgery   Communication Communication Communication: No difficulties   Cognition Arousal/Alertness: Awake/alert Behavior During Therapy: WFL for tasks assessed/performed Overall Cognitive Status: Within Functional Limits for tasks assessed                                                      Home Living Family/patient expects to be discharged to:: Private residence Living Arrangements: Spouse/significant other Available Help at Discharge: Family;Available 24 hours/day Type of Home: House Home Access: Stairs to enter [employer] of Steps: 3 Entrance Stairs-Rails: Can reach both Home Layout: Two level;Able to live on main level with bedroom/bathroom Alternate Level Stairs-Number of Steps: full flight to upstairs office Alternate Level Stairs-Rails: Right Bathroom Shower/Tub: Occupational psychologist: Standard  Home Equipment: Shower seat          Prior Functioning/Environment Level of Independence: Independent                 OT Problem List: Pain      OT Treatment/Interventions:      OT Goals(Current goals can be found in the care plan section) Acute Rehab OT Goals Patient Stated Goal: Return home and recover OT Goal Formulation: With patient Time For Goal Achievement: 11/21/20 Potential to Achieve Goals: Good  OT Frequency:     Barriers to D/C:  None noted          Co-evaluation              AM-PAC OT "6 Clicks" Daily Activity     Outcome Measure Help from another person eating meals?: None Help from another person taking care of personal grooming?: None Help from another person  toileting, which includes using toliet, bedpan, or urinal?: None Help from another person bathing (including washing, rinsing, drying)?: None Help from another person to put on and taking off regular upper body clothing?: None Help from another person to put on and taking off regular lower body clothing?: None 6 Click Score: 24   End of Session Equipment Utilized During Treatment: Back brace Nurse Communication: Mobility status  Activity Tolerance: Patient tolerated treatment well Patient left: in chair;with call bell/phone within reach  OT Visit Diagnosis: Pain Pain - Right/Left:  (back)                Time: 5396-7289 OT Time Calculation (min): 12 min Charges:  OT General Charges $OT Visit: 1 Visit OT Evaluation $OT Eval Moderate Complexity: 1 Mod  11/21/2020  Rebecca Marks, Rebecca Marks  Acute Rehabilitation Services  Office:  [phone]   Rebecca Marks 11/21/2020, 10:40 AM

## 2020-11-21 NOTE — Plan of Care (Signed)
°  Problem: Education: °Goal: Ability to verbalize activity precautions or restrictions will improve °Outcome: Adequate for Discharge °Goal: Knowledge of the prescribed therapeutic regimen will improve °Outcome: Adequate for Discharge °Goal: Understanding of discharge needs will improve °Outcome: Adequate for Discharge °  °Problem: Activity: °Goal: Ability to avoid complications of mobility impairment will improve °Outcome: Adequate for Discharge °Goal: Ability to tolerate increased activity will improve °Outcome: Adequate for Discharge °Goal: Will remain free from falls °Outcome: Adequate for Discharge °  °Problem: Bowel/Gastric: °Goal: Gastrointestinal status for postoperative course will improve °Outcome: Adequate for Discharge °  °

## 2020-11-21 NOTE — Evaluation (Signed)
Physical Therapy Evaluation Patient Details Name: Rebecca Marks MRN: [id number] DOB: June 23, 1955 Today's Date: 11/21/2020   History of Present Illness  65 yo f s/p L4-5 PLIF.  PMH none given, unremarkable.  Clinical Impression  Patient evaluated by Physical Therapy with no further acute PT needs identified. All education has been completed and the patient has no further questions. Independent with mobility and progressive amb; OK for dc home from PT standpoint;  See below for any follow-up Physical Therapy or equipment needs. PT is signing off. Thank you for this referral.     Follow Up Recommendations No PT follow up The potential benefit of Outpatient PT can be addressed at Neurosurgery follow-up appointments.    Equipment Recommendations  None recommended by PT    Recommendations for Other Services       Precautions / Restrictions Precautions Precautions: Back Precaution Booklet Issued: Yes (comment) Precaution Comments: reviewed with patient, verbalized understanding. Required Braces or Orthoses: Spinal Brace Spinal Brace: Lumbar corset;Applied in standing position Restrictions Weight Bearing Restrictions: No      Mobility  Bed Mobility Overal bed mobility: Modified Independent                  Transfers Overall transfer level: Independent               General transfer comment: No difficulty  Ambulation/Gait Ambulation/Gait assistance: Independent Gait Distance (Feet): 500 Feet Assistive device: None Gait Pattern/deviations: WFL(Within Functional Limits)     General Gait Details: Good method for turning with feet to look further to side in stead of twisting  Stairs Stairs: Yes Stairs assistance: Modified independent (Device/Increase time) Stair Management: One rail Left;Forwards;Alternating pattern Number of Stairs: 6 General stair comments: no difficulty  Wheelchair Mobility    Modified Rankin (Stroke Patients Only)       Balance  Overall balance assessment: No apparent balance deficits (not formally assessed)                                           Pertinent Vitals/Pain Pain Assessment: Faces Faces Pain Scale: Hurts a little bit Pain Location: incision Pain Descriptors / Indicators: Aching Pain Intervention(s): Limited activity within patient's tolerance    Home Living Family/patient expects to be discharged to:: Private residence Living Arrangements: Spouse/significant other Available Help at Discharge: Family;Available 24 hours/day Type of Home: House Home Access: Stairs to enter Entrance Stairs-Rails: Can reach both Entrance Stairs-Number of Steps: 3 Home Layout: Two level;Able to live on main level with bedroom/bathroom Home Equipment: Shower seat      Prior Function Level of Independence: Independent               Hand Dominance   Dominant Hand: Right    Extremity/Trunk Assessment   Upper Extremity Assessment Upper Extremity Assessment: Defer to OT evaluation    Lower Extremity Assessment Lower Extremity Assessment: Overall WFL for tasks assessed    Cervical / Trunk Assessment Cervical / Trunk Assessment: Other exceptions Cervical / Trunk Exceptions: spine surgery  Communication   Communication: No difficulties  Cognition Arousal/Alertness: Awake/alert Behavior During Therapy: WFL for tasks assessed/performed Overall Cognitive Status: Within Functional Limits for tasks assessed  General Comments General comments (skin integrity, edema, etc.): Discussed car transfers, and reinforced back precautions; pt also demonstrated donning and doffing brace correctly    Exercises     Assessment/Plan    PT Assessment Patent does not need any further PT services  PT Problem List         PT Treatment Interventions      PT Goals (Current goals can be found in the Care Plan section)  Acute Rehab PT  Goals Patient Stated Goal: Return home and recover PT Goal Formulation: All assessment and education complete, DC therapy    Frequency     Barriers to discharge        Co-evaluation               AM-PAC PT "6 Clicks" Mobility  Outcome Measure Help needed turning from your back to your side while in a flat bed without using bedrails?: None Help needed moving from lying on your back to sitting on the side of a flat bed without using bedrails?: None Help needed moving to and from a bed to a chair (including a wheelchair)?: None Help needed standing up from a chair using your arms (e.g., wheelchair or bedside chair)?: None Help needed to walk in hospital room?: None Help needed climbing 3-5 steps with a railing? : None 6 Click Score: 24    End of Session Equipment Utilized During Treatment: Back brace Activity Tolerance: Patient tolerated treatment well Patient left: Other (comment) (Managing independently in room) Nurse Communication: Mobility status PT Visit Diagnosis: Other abnormalities of gait and mobility (R26.89)    Time: 3754-3606 PT Time Calculation (min) (ACUTE ONLY): 14 min   Charges:   PT Evaluation $PT Eval Low Complexity: [place], PT  Acute Rehabilitation Services Pager [phone] Office [phone]   [place]ta [place] 11/21/2020, 11:56 AM

## 2020-12-03 DIAGNOSIS — M5416 Radiculopathy, lumbar region: Secondary | ICD-10-CM | POA: Diagnosis not present

## 2021-01-15 DIAGNOSIS — M5416 Radiculopathy, lumbar region: Secondary | ICD-10-CM | POA: Diagnosis not present

## 2021-01-17 ENCOUNTER — Other Ambulatory Visit: Payer: Self-pay

## 2021-01-17 ENCOUNTER — Ambulatory Visit: Payer: BC Managed Care – PPO | Admitting: Internal Medicine

## 2021-01-17 ENCOUNTER — Encounter: Payer: Self-pay | Admitting: Internal Medicine

## 2021-01-17 DIAGNOSIS — M5441 Lumbago with sciatica, right side: Secondary | ICD-10-CM

## 2021-01-17 DIAGNOSIS — M5442 Lumbago with sciatica, left side: Secondary | ICD-10-CM

## 2021-01-17 DIAGNOSIS — F5101 Primary insomnia: Secondary | ICD-10-CM | POA: Diagnosis not present

## 2021-01-17 DIAGNOSIS — M4316 Spondylolisthesis, lumbar region: Secondary | ICD-10-CM

## 2021-01-17 DIAGNOSIS — G8929 Other chronic pain: Secondary | ICD-10-CM

## 2021-01-17 MED ORDER — GABAPENTIN 100 MG PO CAPS: 300.0000 mg | ORAL_CAPSULE | Freq: Every day | ORAL | 2 refills | Status: DC

## 2021-01-17 MED ORDER — ZALEPLON 10 MG PO CAPS: 10.0000 mg | ORAL_CAPSULE | Freq: Every evening | ORAL | 1 refills | Status: DC | PRN

## 2021-01-17 MED ORDER — DOXEPIN HCL 10 MG PO CAPS: 10.0000 mg | ORAL_CAPSULE | Freq: Every day | ORAL | 1 refills | Status: DC

## 2021-01-17 NOTE — Progress Notes (Signed)
Subjective:  Patient ID: Rebecca Marks, female    DOB: June 04, 1955  Age: 65 y.o. MRN: [id number]  CC: Follow-up (Requesting handicap form) and Medication Refill (Req refill on Doxepin, Gabapentin, Zalepon, and hydrocodone)   HPI Rebecca Marks presents for chronic insomnia, chronic low back pain, neuropathic pain   Rebecca Marks is status post lumbar four-five Posterior lumbar interbody fusion (N/A) with PEEK cages, autograft, pedicle screw fixation, posterolateral arthrodesis on 11/20/2020  Outpatient Medications Prior to Visit  Medication Sig Dispense Refill   Ascorbic Acid (VITAMIN C) 1000 MG tablet Take 2,000 mg by mouth 2 (two) times daily as needed (immune system support).     Cholecalciferol (VITAMIN D3) 125 MCG (5000 UT) CAPS Take 10,000 Units by mouth every evening.     CORAL CALCIUM PO Take 1 Dose by mouth. Ionic Calcium (Mixed in bottle of water--sand formulation)     doxepin (SINEQUAN) 10 MG capsule TAKE 1 CAPSULE BY MOUTH EVERY DAY AT BEDTIME AS NEEDED (Patient taking differently: Take 10 mg by mouth at bedtime. TAKE 1 CAPSULE BY MOUTH EVERY DAY AT BEDTIME AS NEEDED) 90 capsule 1   gabapentin (NEURONTIN) 100 MG capsule Take 3 capsules (300 mg total) by mouth at bedtime. (Patient taking differently: Take 200 mg by mouth at bedtime.) 360 capsule 2   HYDROcodone-acetaminophen (NORCO/VICODIN) 5-325 MG tablet Take 1-2 tablet by mouth every 6 hours as needed for pain     methocarbamol (ROBAXIN) 500 MG tablet Take 1 tablet (500 mg total) by mouth every 6 (six) hours as needed for muscle spasms. 90 tablet 0   PHOSPHATIDYLSERINE PO Take 300 mg by mouth at bedtime.     VOLTAREN 1 % GEL Apply 1 application topically in the morning and at bedtime. Applied to both knees and lower back     zaleplon (SONATA) 10 MG capsule Take 1 capsule (10 mg total) by mouth at bedtime as needed for sleep. 90 capsule 1   oxyCODONE-acetaminophen (PERCOCET) 5-325 MG tablet Take 1-2 tablets by mouth every 4 (four)  hours as needed for severe pain. (Patient not taking: Reported on 01/17/2021) 20 tablet 0   varenicline (CHANTIX) 1 MG tablet Take 1 tablet (1 mg total) by mouth daily. (Patient not taking: No sig reported) 7 tablet 1   No facility-administered medications prior to visit.    ROS: Review of Systems  Constitutional:  Negative for activity change, appetite change, chills, fatigue and unexpected weight change.  HENT:  Negative for congestion, mouth sores and sinus pressure.   Eyes:  Negative for visual disturbance.  Respiratory:  Negative for cough and chest tightness.   Gastrointestinal:  Negative for abdominal pain and nausea.  Genitourinary:  Negative for difficulty urinating, frequency and vaginal pain.  Musculoskeletal:  Positive for back pain. Negative for gait problem.  Skin:  Negative for pallor and rash.  Neurological:  Negative for dizziness, tremors, weakness, numbness and headaches.  Psychiatric/Behavioral:  Positive for sleep disturbance. Negative for confusion.    Objective:  BP 110/82 (BP Location: Left Arm)   Pulse 76   Temp 98.2 F (36.8 C) (Oral)   Ht 5' 6.5" (1.689 m)   Wt 129 lb 12.8 oz (58.9 kg)   SpO2 97%   BMI 20.64 kg/m   BP Readings from Last 3 Encounters:  01/17/21 110/82  11/21/20 112/68  11/14/20 (!) 134/93    Wt Readings from Last 3 Encounters:  01/17/21 129 lb 12.8 oz (58.9 kg)  11/20/20 128 lb 11.2 oz (58.4 kg)  11/14/20  128 lb 11.2 oz (58.4 kg)    Physical Exam Constitutional:      General: She is not in acute distress.    Appearance: She is well-developed.  HENT:     Head: Normocephalic.     Right Ear: External ear normal.     Left Ear: External ear normal.     Nose: Nose normal.  Eyes:     General:        Right eye: No discharge.        Left eye: No discharge.     Conjunctiva/sclera: Conjunctivae normal.     Pupils: Pupils are equal, round, and reactive to light.  Neck:     Thyroid: No thyromegaly.     Vascular: No JVD.      Trachea: No tracheal deviation.  Cardiovascular:     Rate and Rhythm: Normal rate and regular rhythm.     Heart sounds: Normal heart sounds.  Pulmonary:     Effort: No respiratory distress.     Breath sounds: No stridor. No wheezing.  Abdominal:     General: Bowel sounds are normal. There is no distension.     Palpations: Abdomen is soft. There is no mass.     Tenderness: There is no abdominal tenderness. There is no guarding or rebound.  Musculoskeletal:        General: No tenderness.     Cervical back: Normal range of motion and neck supple. No rigidity.  Lymphadenopathy:     Cervical: No cervical adenopathy.  Skin:    Findings: No erythema or rash.  Neurological:     Cranial Nerves: No cranial nerve deficit.     Motor: No abnormal muscle tone.     Coordination: Coordination normal.     Deep Tendon Reflexes: Reflexes normal.  Psychiatric:        Behavior: Behavior normal.        Thought Content: Thought content normal.        Judgment: Judgment normal.    Lab Results  Component Value Date   WBC 8.3 11/14/2020   HGB 14.3 11/14/2020   HCT 39.6 11/14/2020   PLT 261 11/14/2020   GLUCOSE 96 11/14/2020   CHOL 164 04/21/2018   TRIG 129.0 04/21/2018   HDL 48.70 04/21/2018   LDLCALC 90 04/21/2018   ALT 9 11/14/2020   AST 23 11/14/2020   NA 137 11/14/2020   K 3.9 11/14/2020   CL 103 11/14/2020   CREATININE 0.73 11/14/2020   BUN 9 11/14/2020   CO2 27 11/14/2020   TSH 2.04 04/21/2018   INR 1.0 09/12/2015   HGBA1C 4.4 (L) 07/30/2006    No results found.  Assessment & Plan:    Follow-up: No follow-ups on file.  Walker Kehr, MD

## 2021-01-17 NOTE — Assessment & Plan Note (Addendum)
  Status post lumbar four-five Posterior lumbar interbody fusion (N/A) with PEEK cages, autograft, pedicle screw fixation, posterolateral arthrodesis on 11/20/2020. Prescribed gabapentin.  She is currently on hydrocodone postop.

## 2021-01-17 NOTE — Patient Instructions (Addendum)
Try TENS unit  OrthoFeet shoes

## 2021-01-18 NOTE — Assessment & Plan Note (Signed)
Continue on zaleplon on doxepin.  Potential benefits of a long term benzodiazepines  use as well as potential risks  and complications were explained to the patient and were aknowledged.

## 2021-01-18 NOTE — Assessment & Plan Note (Signed)
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  Continue with gabapentin Tried TENS unit

## 2021-01-22 DIAGNOSIS — F4323 Adjustment disorder with mixed anxiety and depressed mood: Secondary | ICD-10-CM | POA: Diagnosis not present

## 2021-03-11 DIAGNOSIS — M5441 Lumbago with sciatica, right side: Secondary | ICD-10-CM | POA: Diagnosis not present

## 2021-03-11 DIAGNOSIS — M5136 Other intervertebral disc degeneration, lumbar region: Secondary | ICD-10-CM | POA: Diagnosis not present

## 2021-03-11 DIAGNOSIS — M48061 Spinal stenosis, lumbar region without neurogenic claudication: Secondary | ICD-10-CM | POA: Diagnosis not present

## 2021-03-11 DIAGNOSIS — M48062 Spinal stenosis, lumbar region with neurogenic claudication: Secondary | ICD-10-CM | POA: Diagnosis not present

## 2021-03-13 ENCOUNTER — Other Ambulatory Visit: Payer: Self-pay | Admitting: Internal Medicine

## 2021-05-21 ENCOUNTER — Telehealth (INDEPENDENT_AMBULATORY_CARE_PROVIDER_SITE_OTHER): Payer: Medicare Other | Admitting: Family Medicine

## 2021-05-21 DIAGNOSIS — R059 Cough, unspecified: Secondary | ICD-10-CM | POA: Diagnosis not present

## 2021-05-21 MED ORDER — ALBUTEROL SULFATE HFA 108 (90 BASE) MCG/ACT IN AERS: 2.0000 | INHALATION_SPRAY | Freq: Four times a day (QID) | RESPIRATORY_TRACT | 3 refills | Status: DC | PRN

## 2021-05-21 MED ORDER — PROMETHAZINE-DM 6.25-15 MG/5ML PO SYRP: 5.0000 mL | ORAL_SOLUTION | Freq: Four times a day (QID) | ORAL | 0 refills | Status: DC | PRN

## 2021-05-21 MED ORDER — DOXYCYCLINE HYCLATE 100 MG PO TABS: 100.0000 mg | ORAL_TABLET | Freq: Two times a day (BID) | ORAL | 0 refills | Status: DC

## 2021-05-21 NOTE — Patient Instructions (Signed)
-  I sent the medication(s) we discussed to your pharmacy: Meds ordered this encounter  Medications   albuterol (VENTOLIN HFA) 108 (90 Base) MCG/ACT inhaler    Sig: Inhale 2 puffs into the lungs every 6 (six) hours as needed for wheezing or shortness of breath.    Dispense:  1 each    Refill:  3   doxycycline (VIBRA-TABS) 100 MG tablet    Sig: Take 1 tablet (100 mg total) by mouth 2 (two) times daily.    Dispense:  14 tablet    Refill:  0   promethazine-dextromethorphan (PROMETHAZINE-DM) 6.25-15 MG/5ML syrup    Sig: Take 5 mLs by mouth 4 (four) times daily as needed for cough.    Dispense:  118 mL    Refill:  0     I hope you are feeling better soon!  Seek in person care promptly if your symptoms worsen, new concerns arise or you are not improving with treatment.  It was nice to meet you today. I help [facility] out with telemedicine visits on Tuesdays and Thursdays and am happy to help if you need a virtual follow up visit on those days. Otherwise, if you have any concerns or questions following this visit please schedule a follow up visit with your Primary Care office or seek care at a local urgent care clinic to avoid delays in care

## 2021-05-21 NOTE — Progress Notes (Signed)
Virtual Visit via Video Note  I connected with Besty  on 05/21/21 at 11:00 AM EST by a video enabled telemedicine application and verified that I am speaking with the correct person using two identifiers.  Location patient: [place] Location provider:work or home office Persons participating in the virtual visit: patient, provider  I discussed the limitations and requested verbal permission for telemedicine visit. The patient expressed understanding and agreed to proceed.   HPI:  Acute telemedicine visit for cough: -Onset: x 2 weeks -Symptoms include: productive cough, started with a flu like illness and had SOB initially, now the cough is worse and is coughing up green sputum, low grade temp 99 last night, thinks has some rhonchi at times -negative covid testing -Denies: CP, SOB (except with cough), NVD -Pertinent past medical history: see below, has had CAP before -Pertinent medication allergies: Allergies  Allergen Reactions   Ritalin [Methylphenidate Hcl]     Too hyper   -COVID-19 vaccine status:  Immunization History  Administered Date(s) Administered   Hepatitis B 04/19/2010, 05/24/2010   Influenza Inj Mdck Quad Pf 02/27/2018, 02/02/2019, 01/18/2020   Influenza Whole 04/19/2010   Influenza, Seasonal, Injecte, Preservative Fre 04/06/2017   Influenza,inj,Quad PF,6+ Mos 04/07/2014, 05/02/2015, 02/08/2016, 02/02/2019   PFIZER Comirnaty(Gray Top)Covid-19 Tri-Sucrose Vaccine 06/15/2020   PFIZER(Purple Top)SARS-COV-2 Vaccination 08/05/2019, 08/30/2019, 01/27/2020   Pneumococcal Conjugate-13 09/11/2015   Td 05/19/2000, 08/02/2013   Zoster Recombinat (Shingrix) 04/21/2018, 07/08/2018     ROS: See pertinent positives and negatives per HPI.  Past Medical History:  Diagnosis Date   Allergy    Anxiety    Arthritis    R wrist and R knee   CAP (community acquired pneumonia) 4/17-22/15   Rochephin & IV Zithromax & 3 days Levaquin   Chronic back pain    DVT (deep venous thrombosis)  ([facility])    a. in her 20s while on OCPs.   Dyspnea    GERD (gastroesophageal reflux disease)    Hepatitis C    contaminated blood @ her job, prior notes indicate spontaneously cleared   History of tobacco abuse    Osteoarthritis    Palpitations    Premature atrial contractions    a. Holter 11/2013: few PACs.   Pulmonary function study abnormality    a. 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   Raynaud phenomenon    Ruptured lumbar disc    4 herniated disc    Volume overload    a.  history of volume overload during hospital stay for PNA in 08/2013 felt iatrogenic due to receiving a lot of IV fluid. 2D echo 08/2013 showed EF 62-37%, normal diastolic function parameters.    Past Surgical History:  Procedure Laterality Date   CHOLECYSTECTOMY  1985   lumbar stem cell   2020     Current Outpatient Medications:    albuterol (VENTOLIN HFA) 108 (90 Base) MCG/ACT inhaler, Inhale 2 puffs into the lungs every 6 (six) hours as needed for wheezing or shortness of breath., Disp: 1 each, Rfl: 3   doxycycline (VIBRA-TABS) 100 MG tablet, Take 1 tablet (100 mg total) by mouth 2 (two) times daily., Disp: 14 tablet, Rfl: 0   promethazine-dextromethorphan (PROMETHAZINE-DM) 6.25-15 MG/5ML syrup, Take 5 mLs by mouth 4 (four) times daily as needed for cough., Disp: 118 mL, Rfl: 0   Ascorbic Acid (VITAMIN C) 1000 MG tablet, Take 2,000 mg by mouth 2 (two) times daily as needed (immune system support)., Disp: , Rfl:    Cholecalciferol (VITAMIN D3) 125 MCG (  5000 UT) CAPS, Take 10,000 Units by mouth every evening., Disp: , Rfl:    CORAL CALCIUM PO, Take 1 Dose by mouth. Ionic Calcium (Mixed in bottle of water--sand formulation), Disp: , Rfl:    cyclobenzaprine (FLEXERIL) 10 MG tablet, TAKE 1 TABLET BY MOUTH EVERY DAY AT BEDTIME AS NEEDED, Disp: 30 tablet, Rfl: 3   doxepin (SINEQUAN) 10 MG capsule, Take 1 capsule (10 mg total) by mouth at bedtime. TAKE 1 CAPSULE BY MOUTH EVERY DAY AT BEDTIME AS NEEDED,  Disp: 90 capsule, Rfl: 1   gabapentin (NEURONTIN) 100 MG capsule, Take 3 capsules (300 mg total) by mouth at bedtime., Disp: 360 capsule, Rfl: 2   HYDROcodone-acetaminophen (NORCO/VICODIN) 5-325 MG tablet, Take 1-2 tablet by mouth every 6 hours as needed for pain, Disp: , Rfl:    methocarbamol (ROBAXIN) 500 MG tablet, Take 1 tablet (500 mg total) by mouth every 6 (six) hours as needed for muscle spasms., Disp: 90 tablet, Rfl: 0   PHOSPHATIDYLSERINE PO, Take 300 mg by mouth at bedtime., Disp: , Rfl:    VOLTAREN 1 % GEL, Apply 1 application topically in the morning and at bedtime. Applied to both knees and lower back, Disp: , Rfl:    zaleplon (SONATA) 10 MG capsule, Take 1 capsule (10 mg total) by mouth at bedtime as needed for sleep., Disp: 90 capsule, Rfl: 1  EXAM:  VITALS per patient if applicable: T 72.6, P 88, BP 128/70  GENERAL: alert, oriented, appears well and in no acute distress  HEENT: atraumatic, conjunttiva clear, no obvious abnormalities on inspection of external nose and ears  NECK: normal movements of the head and neck  LUNGS: on inspection no signs of respiratory distress, breathing rate appears normal, no obvious gross SOB, gasping or wheezing  CV: no obvious cyanosis  MS: moves all visible extremities without noticeable abnormality  PSYCH/NEURO: pleasant and cooperative, no obvious depression or anxiety, speech and thought processing grossly intact  ASSESSMENT AND PLAN:  Discussed the following assessment and plan:  Cough, unspecified type  -we discussed possible serious and likely etiologies, options for evaluation and workup, limitations of telemedicine visit vs in person visit, treatment, treatment risks and precautions. Pt is agreeable to treatment via telemedicine at this moment. Query developing sinusitis vs bronchitis vs CAP vs other. She opted to try a cough medication, alb prn and addition of doxy 100mg  bid x 7 days if worsening or not improving with the  other measures.  Advised to seek prompt virtual visit or in person care if worsening, new symptoms arise, or if is not improving with treatment as expected per our conversation of expected course. Discussed options for follow up care. Did let this patient know that I do telemedicine on Tuesdays and Thursdays for [facility] and those are the days I am logged into the system. Advised to schedule follow up visit with PCP, [facility] virtual visits or [facility] if any further questions or concerns to avoid delays in care.   I discussed the assessment and treatment plan with the patient. The patient was provided an opportunity to ask questions and all were answered. The patient agreed with the plan and demonstrated an understanding of the instructions.     Lucretia Kern, DO

## 2021-06-27 ENCOUNTER — Other Ambulatory Visit: Payer: Self-pay | Admitting: Internal Medicine

## 2021-07-13 ENCOUNTER — Other Ambulatory Visit: Payer: Self-pay | Admitting: Internal Medicine

## 2021-08-05 ENCOUNTER — Encounter: Payer: Self-pay | Admitting: Internal Medicine

## 2021-08-05 ENCOUNTER — Telehealth: Payer: Self-pay | Admitting: Internal Medicine

## 2021-08-05 DIAGNOSIS — Z0000 Encounter for general adult medical examination without abnormal findings: Secondary | ICD-10-CM

## 2021-08-05 DIAGNOSIS — F419 Anxiety disorder, unspecified: Secondary | ICD-10-CM

## 2021-08-05 DIAGNOSIS — G8929 Other chronic pain: Secondary | ICD-10-CM

## 2021-08-05 NOTE — Telephone Encounter (Signed)
Pt requesting an order for labs prior to cpe appt on 08-19-2021 ?

## 2021-08-06 ENCOUNTER — Other Ambulatory Visit: Payer: Self-pay | Admitting: Internal Medicine

## 2021-08-06 MED ORDER — ZALEPLON 10 MG PO CAPS: 10.0000 mg | ORAL_CAPSULE | Freq: Every evening | ORAL | 1 refills | Status: DC | PRN

## 2021-08-07 ENCOUNTER — Encounter: Payer: Self-pay | Admitting: *Deleted

## 2021-08-07 NOTE — Telephone Encounter (Signed)
NOTE NOT NEEDED ?

## 2021-08-19 ENCOUNTER — Encounter: Payer: BC Managed Care – PPO | Admitting: Internal Medicine

## 2021-08-26 ENCOUNTER — Other Ambulatory Visit (INDEPENDENT_AMBULATORY_CARE_PROVIDER_SITE_OTHER): Payer: Medicare Other

## 2021-08-26 DIAGNOSIS — F419 Anxiety disorder, unspecified: Secondary | ICD-10-CM | POA: Diagnosis not present

## 2021-08-26 DIAGNOSIS — M5442 Lumbago with sciatica, left side: Secondary | ICD-10-CM

## 2021-08-26 DIAGNOSIS — Z0000 Encounter for general adult medical examination without abnormal findings: Secondary | ICD-10-CM

## 2021-08-26 DIAGNOSIS — G8929 Other chronic pain: Secondary | ICD-10-CM

## 2021-08-26 DIAGNOSIS — M5441 Lumbago with sciatica, right side: Secondary | ICD-10-CM

## 2021-08-26 LAB — URINALYSIS
Bilirubin Urine: NEGATIVE
Hgb urine dipstick: NEGATIVE
Ketones, ur: NEGATIVE
Leukocytes,Ua: NEGATIVE
Nitrite: NEGATIVE
Specific Gravity, Urine: 1.015 (ref 1.000–1.030)
Total Protein, Urine: NEGATIVE
Urine Glucose: NEGATIVE
Urobilinogen, UA: 0.2 (ref 0.0–1.0)
pH: 6.5 (ref 5.0–8.0)

## 2021-08-26 LAB — COMPREHENSIVE METABOLIC PANEL
ALT: 4 U/L (ref 0–35)
AST: 15 U/L (ref 0–37)
Albumin: 4.8 g/dL (ref 3.5–5.2)
Alkaline Phosphatase: 68 U/L (ref 39–117)
BUN: 19 mg/dL (ref 6–23)
CO2: 27 mEq/L (ref 19–32)
Calcium: 9.5 mg/dL (ref 8.4–10.5)
Chloride: 102 mEq/L (ref 96–112)
Creatinine, Ser: 0.71 mg/dL (ref 0.40–1.20)
GFR: 89.33 mL/min (ref >60.00)
Glucose, Bld: 105 mg/dL — ABNORMAL HIGH (ref 70–99)
Potassium: 3.7 mEq/L (ref 3.5–5.1)
Sodium: 139 mEq/L (ref 135–145)
Total Bilirubin: 0.5 mg/dL (ref 0.2–1.2)
Total Protein: 7.2 g/dL (ref 6.0–8.3)

## 2021-08-26 LAB — CBC WITH DIFFERENTIAL/PLATELET
Basophils Absolute: 0 10*3/uL (ref 0.0–0.1)
Basophils Relative: 0.3 % (ref 0.0–3.0)
Eosinophils Absolute: 0.1 10*3/uL (ref 0.0–0.7)
Eosinophils Relative: 0.6 % (ref 0.0–5.0)
HCT: 42.4 % (ref 36.0–46.0)
Hemoglobin: 15 g/dL (ref 12.0–15.0)
Lymphocytes Relative: 40 % (ref 12.0–46.0)
Lymphs Abs: 4.6 10*3/uL — ABNORMAL HIGH (ref 0.7–4.0)
MCHC: 35.4 g/dL (ref 30.0–36.0)
MCV: 96.5 fl (ref 78.0–100.0)
Monocytes Absolute: 0.7 10*3/uL (ref 0.1–1.0)
Monocytes Relative: 6.4 % (ref 3.0–12.0)
Neutro Abs: 6.1 10*3/uL (ref 1.4–7.7)
Neutrophils Relative %: 52.7 % (ref 43.0–77.0)
Platelets: 262 10*3/uL (ref 150.0–400.0)
RBC: 4.4 Mil/uL (ref 3.87–5.11)
RDW: 12.4 % (ref 11.5–15.5)
WBC: 11.6 10*3/uL — ABNORMAL HIGH (ref 4.0–10.5)

## 2021-08-26 LAB — LDL CHOLESTEROL, DIRECT: Direct LDL: 92 mg/dL

## 2021-08-26 LAB — LIPID PANEL
Cholesterol: 164 mg/dL (ref 0–200)
HDL: 38 mg/dL — ABNORMAL LOW (ref >39.00)
NonHDL: 126.39
Total CHOL/HDL Ratio: 4
Triglycerides: 344 mg/dL — ABNORMAL HIGH (ref 0.0–149.0)
VLDL: 68.8 mg/dL — ABNORMAL HIGH (ref 0.0–40.0)

## 2021-08-26 LAB — TSH: TSH: 1.86 u[IU]/mL (ref 0.35–5.50)

## 2021-09-02 ENCOUNTER — Encounter: Payer: BC Managed Care – PPO | Admitting: Internal Medicine

## 2021-09-04 ENCOUNTER — Encounter: Payer: Self-pay | Admitting: Internal Medicine

## 2021-09-04 ENCOUNTER — Ambulatory Visit (INDEPENDENT_AMBULATORY_CARE_PROVIDER_SITE_OTHER): Payer: Medicare Other | Admitting: Internal Medicine

## 2021-09-04 DIAGNOSIS — M5442 Lumbago with sciatica, left side: Secondary | ICD-10-CM | POA: Diagnosis not present

## 2021-09-04 DIAGNOSIS — F17200 Nicotine dependence, unspecified, uncomplicated: Secondary | ICD-10-CM | POA: Diagnosis not present

## 2021-09-04 DIAGNOSIS — Z0000 Encounter for general adult medical examination without abnormal findings: Secondary | ICD-10-CM | POA: Diagnosis not present

## 2021-09-04 DIAGNOSIS — M5441 Lumbago with sciatica, right side: Secondary | ICD-10-CM | POA: Diagnosis not present

## 2021-09-04 DIAGNOSIS — G8929 Other chronic pain: Secondary | ICD-10-CM | POA: Diagnosis not present

## 2021-09-04 DIAGNOSIS — E559 Vitamin D deficiency, unspecified: Secondary | ICD-10-CM | POA: Diagnosis not present

## 2021-09-04 DIAGNOSIS — F17210 Nicotine dependence, cigarettes, uncomplicated: Secondary | ICD-10-CM | POA: Diagnosis not present

## 2021-09-04 DIAGNOSIS — M51369 Other intervertebral disc degeneration, lumbar region without mention of lumbar back pain or lower extremity pain: Secondary | ICD-10-CM | POA: Insufficient documentation

## 2021-09-04 DIAGNOSIS — M5136 Other intervertebral disc degeneration, lumbar region: Secondary | ICD-10-CM | POA: Insufficient documentation

## 2021-09-04 MED ORDER — VENLAFAXINE HCL ER 37.5 MG PO CP24: 37.5000 mg | ORAL_CAPSULE | Freq: Every day | ORAL | 1 refills | Status: DC

## 2021-09-04 MED ORDER — DOXYCYCLINE HYCLATE 100 MG PO TABS: 100.0000 mg | ORAL_TABLET | Freq: Two times a day (BID) | ORAL | 0 refills | Status: DC

## 2021-09-04 NOTE — Patient Instructions (Addendum)
Blue-Emu cream - 2-3 times a day ? ?TENS unit ? ?Massage chair? ?

## 2021-09-04 NOTE — Assessment & Plan Note (Signed)
On Vit D 

## 2021-09-04 NOTE — Progress Notes (Signed)
? ?Subjective:  ?Patient ID: Rebecca Marks, female    DOB: [birth year]  Age: 66 y.o. MRN: [id number] ? ?CC: No chief complaint on file. ? ? ?HPI ?Rebecca Marks presents for severe LBP 8/10 during the day. S/p back surgery by Dr. Vertell Limber last July 2022.  No pain at night. ?They are moving again to their old house. ?Well exam ?Rebecca Marks is planning to stop smoking (currently smoking 1/2 PPD) ? ?Outpatient Medications Prior to Visit  ?Medication Sig Dispense Refill  ? albuterol (VENTOLIN HFA) 108 (90 Base) MCG/ACT inhaler Inhale 2 puffs into the lungs every 6 (six) hours as needed for wheezing or shortness of breath. 1 each 3  ? Ascorbic Acid (VITAMIN C) 1000 MG tablet Take 2,000 mg by mouth 2 (two) times daily as needed (immune system support).    ? Cholecalciferol (VITAMIN D3) 125 MCG (5000 UT) CAPS Take 10,000 Units by mouth every evening.    ? doxepin (SINEQUAN) 10 MG capsule TAKE 1 CAPSULE BY MOUTH AT BEDTIME. TAKE 1 CAPSULE BY MOUTH EVERY DAY AT BEDTIME AS NEEDED 90 capsule 1  ? gabapentin (NEURONTIN) 100 MG capsule Take 3 capsules (300 mg total) by mouth at bedtime. 360 capsule 2  ? HYDROcodone-acetaminophen (NORCO/VICODIN) 5-325 MG tablet Take 1-2 tablet by mouth every 6 hours as needed for pain    ? methocarbamol (ROBAXIN) 500 MG tablet Take 1 tablet (500 mg total) by mouth every 6 (six) hours as needed for muscle spasms. 90 tablet 0  ? oxyCODONE (OXY IR/ROXICODONE) 5 MG immediate release tablet Take 5 mg by mouth every 6 (six) hours as needed for severe pain.    ? PHOSPHATIDYLSERINE PO Take 300 mg by mouth at bedtime.    ? promethazine-dextromethorphan (PROMETHAZINE-DM) 6.25-15 MG/5ML syrup Take 5 mLs by mouth 4 (four) times daily as needed for cough. 118 mL 0  ? VOLTAREN 1 % GEL Apply 1 application topically in the morning and at bedtime. Applied to both knees and lower back    ? zaleplon (SONATA) 10 MG capsule Take 1 capsule (10 mg total) by mouth at bedtime as needed for sleep. 90 capsule 1  ? CORAL  CALCIUM PO Take 1 Dose by mouth. Ionic Calcium (Mixed in bottle of water--sand formulation) (Patient not taking: Reported on 09/04/2021)    ? cyclobenzaprine (FLEXERIL) 10 MG tablet TAKE 1 TABLET BY MOUTH EVERY DAY AT BEDTIME AS NEEDED (Patient not taking: Reported on 09/04/2021) 30 tablet 3  ? doxycycline (VIBRA-TABS) 100 MG tablet Take 1 tablet (100 mg total) by mouth 2 (two) times daily. 14 tablet 0  ? ?No facility-administered medications prior to visit.  ? ? ?ROS: ?Review of Systems  ?Constitutional:  Negative for activity change, appetite change, chills, fatigue and unexpected weight change.  ?HENT:  Negative for congestion, mouth sores and sinus pressure.   ?Eyes:  Negative for visual disturbance.  ?Respiratory:  Negative for cough and chest tightness.   ?Gastrointestinal:  Negative for abdominal pain and nausea.  ?Genitourinary:  Negative for difficulty urinating, frequency and vaginal pain.  ?Musculoskeletal:  Negative for back pain and gait problem.  ?Skin:  Negative for pallor and rash.  ?Neurological:  Negative for dizziness, tremors, weakness, numbness and headaches.  ?Psychiatric/Behavioral:  Positive for decreased concentration. Negative for confusion, sleep disturbance and suicidal ideas.   ? ?Objective:  ?BP 92/60 (BP Location: Left Arm, Patient Position: Sitting, Cuff Size: Large)   Pulse 79   Temp 98.4 ?F (36.9 ?C) (Oral)   Ht 5' 6.5" (1.689  m)   Wt 122 lb (55.3 kg)   SpO2 99%   BMI 19.40 kg/m?  ? ?BP Readings from Last 3 Encounters:  ?09/04/21 92/60  ?01/17/21 110/82  ?11/21/20 112/68  ? ? ?Wt Readings from Last 3 Encounters:  ?09/04/21 122 lb (55.3 kg)  ?01/17/21 129 lb 12.8 oz (58.9 kg)  ?11/20/20 128 lb 11.2 oz (58.4 kg)  ? ? ?Physical Exam ?Constitutional:   ?   General: She is not in acute distress. ?   Appearance: Normal appearance. She is well-developed.  ?HENT:  ?   Head: Normocephalic.  ?   Right Ear: External ear normal.  ?   Left Ear: External ear normal.  ?   Nose: Nose normal.   ?Eyes:  ?   General:     ?   Right eye: No discharge.     ?   Left eye: No discharge.  ?   Conjunctiva/sclera: Conjunctivae normal.  ?   Pupils: Pupils are equal, round, and reactive to light.  ?Neck:  ?   Thyroid: No thyromegaly.  ?   Vascular: No JVD.  ?   Trachea: No tracheal deviation.  ?Cardiovascular:  ?   Rate and Rhythm: Normal rate and regular rhythm.  ?   Heart sounds: Normal heart sounds.  ?Pulmonary:  ?   Effort: No respiratory distress.  ?   Breath sounds: No stridor. No wheezing.  ?Abdominal:  ?   General: Bowel sounds are normal. There is no distension.  ?   Palpations: Abdomen is soft. There is no mass.  ?   Tenderness: There is no abdominal tenderness. There is no guarding or rebound.  ?Musculoskeletal:     ?   General: No tenderness.  ?   Cervical back: Normal range of motion and neck supple. No rigidity.  ?Lymphadenopathy:  ?   Cervical: No cervical adenopathy.  ?Skin: ?   Findings: No erythema or rash.  ?Neurological:  ?   Cranial Nerves: No cranial nerve deficit.  ?   Motor: No abnormal muscle tone.  ?   Coordination: Coordination normal.  ?   Deep Tendon Reflexes: Reflexes normal.  ?Psychiatric:     ?   Behavior: Behavior normal.     ?   Thought Content: Thought content normal.     ?   Judgment: Judgment normal.  ?LS w/pain ?Using a cane ? ?Lab Results  ?Component Value Date  ? WBC 11.6 (H) 08/26/2021  ? HGB 15.0 08/26/2021  ? HCT 42.4 08/26/2021  ? PLT 262.0 08/26/2021  ? GLUCOSE 105 (H) 08/26/2021  ? CHOL 164 08/26/2021  ? TRIG 344.0 (H) 08/26/2021  ? HDL 38.00 (L) 08/26/2021  ? LDLDIRECT 92.0 08/26/2021  ? [place] 90 04/21/2018  ? ALT 4 08/26/2021  ? AST 15 08/26/2021  ? NA 139 08/26/2021  ? K 3.7 08/26/2021  ? CL 102 08/26/2021  ? CREATININE 0.71 08/26/2021  ? BUN 19 08/26/2021  ? CO2 27 08/26/2021  ? TSH 1.86 08/26/2021  ? INR 1.0 09/12/2015  ? HGBA1C 4.4 (L) 07/30/2006  ? ? ?DG Lumbar Spine 2-3 Views ? ?Result Date: 11/20/2020 ?CLINICAL DATA:  Provided history: Elective surgery. Additional  history provided: Lumbar 4-5 posterior lumbar interbody fusion. Provided fluoroscopy time 55 seconds (24.83 mGy). EXAM: LUMBAR SPINE - 2-3 VIEW; DG C-ARM 1-60 MIN COMPARISON:  Lumbar spine MRI 08/28/2020. Lumbar spine radiographs 09/10/2020. FINDINGS: P and lateral view intraoperative fluoroscopic images of the lumbar spine are submitted, 2 images total. The  lowest well-formed intervertebral disc space is designated L5-S1. On the provided images, bilateral pedicle screws are present at L4 and L5. Vertical interconnecting rods were not present at the time the images were taken. Interbody device(s) also present at L4-L5. IMPRESSION: Three intraoperative fluoroscopic images of the lumbar spine from L4-L5 posterior fusion, as described. Electronically Signed   By: Kellie Simmering DO   On: 11/20/2020 10:24  ? ?DG C-Arm 1-60 Min ? ?Result Date: 11/20/2020 ?CLINICAL DATA:  Provided history: Elective surgery. Additional history provided: Lumbar 4-5 posterior lumbar interbody fusion. Provided fluoroscopy time 55 seconds (24.83 mGy). EXAM: LUMBAR SPINE - 2-3 VIEW; DG C-ARM 1-60 MIN COMPARISON:  Lumbar spine MRI 08/28/2020. Lumbar spine radiographs 09/10/2020. FINDINGS: P and lateral view intraoperative fluoroscopic images of the lumbar spine are submitted, 2 images total. The lowest well-formed intervertebral disc space is designated L5-S1. On the provided images, bilateral pedicle screws are present at L4 and L5. Vertical interconnecting rods were not present at the time the images were taken. Interbody device(s) also present at L4-L5. IMPRESSION: Three intraoperative fluoroscopic images of the lumbar spine from L4-L5 posterior fusion, as described. Electronically Signed   By: Kellie Simmering DO   On: 11/20/2020 10:24  ? ? ?Assessment & Plan:  ? ?Problem List Items Addressed This Visit   ? ? Chronic low back pain  ?  Blue-Emu cream - 2-3 times a day ?In PT ?Get a TENS unit via PT ?See Dr Davy Pique ?  ?  ? Relevant Medications  ?  oxyCODONE (OXY IR/ROXICODONE) 5 MG immediate release tablet  ? venlafaxine XR (EFFEXOR-XR) 37.5 MG 24 hr capsule  ? Vitamin D deficiency  ?  On Vit D ? ?  ?  ? Well adult exam  ?   ?We discussed age appropriate health

## 2021-09-04 NOTE — Assessment & Plan Note (Addendum)
?  We discussed age appropriate health related issues, including available/recomended screening tests and vaccinations. Labs were ordered to be later reviewed . All questions were answered. We discussed one or more of the following - seat belt use, use of sunscreen/sun exposure exercise, fall risk reduction, second hand smoke exposure, firearm use and storage, seat belt use, a need for adhering to healthy diet and exercise. ?Labs were ordered.  All questions were answered. ? ?Rebecca Marks is planning to stop smoking (currently smoking 1/2 PPD)-smoking cessation discussed ? ?

## 2021-09-04 NOTE — Assessment & Plan Note (Signed)
Blue-Emu cream - 2-3 times a day ?In PT ?Get a TENS unit via PT ?See Dr Davy Pique ?

## 2021-09-08 NOTE — Assessment & Plan Note (Signed)
Rebecca Marks is planning to stop smoking (currently smoking 1/2 PPD)-smoking cessation discussed ?

## 2021-09-12 ENCOUNTER — Telehealth: Payer: Self-pay

## 2021-09-12 NOTE — Telephone Encounter (Signed)
Pt is calling reporting that she has been waking up at 1am unable to return to sleep and increase gastric reflux since starting venlafaxine XR (EFFEXOR-XR) 37.5 MG 24 hr capsule ? ?Pt is requesting to try varenicline (CHANTIX) 1 MG tablet  which is on the D/C med list from 3/22. ? ?Please advise. ?

## 2021-09-13 ENCOUNTER — Telehealth: Payer: Self-pay | Admitting: Internal Medicine

## 2021-09-13 NOTE — Telephone Encounter (Signed)
Discontinue venlafaxine.  Chantix is off the market. ?We can start Cymbalta if she is willing ?Thanks, ?

## 2021-09-13 NOTE — Telephone Encounter (Signed)
error 

## 2021-09-13 NOTE — Telephone Encounter (Signed)
PT calls back today and had informed me she had some concerns with taking the Cymbalta as she had a bad interaction with a medicine of a similar family. She had also stated that if the varenicline (in any kind, could be generic or could be another name) was available she would be willing to take that.  ? ?CB: [phone] ?

## 2021-09-13 NOTE — Telephone Encounter (Signed)
Called pt no answer LMOM w/ MD response.If wanting to start cymbalta call back and let MD know,.../lmb ?

## 2021-09-16 ENCOUNTER — Encounter: Payer: Self-pay | Admitting: Internal Medicine

## 2021-09-17 NOTE — Telephone Encounter (Signed)
FDA removed varenicline containing the N-nitroso-varenicline impurity off the market due to potential cancerogenic properties. I am not sure if the manufacturing problem has been solved.  Rebecca Marks can talk to her pharmacist at [facility] and make inquiries if Chantix or generic Chantix is available now. ?Thanks ?

## 2021-09-17 NOTE — Telephone Encounter (Signed)
Pt sent a mychart msg.. replied back w/ MD response..lmb ?

## 2021-11-07 ENCOUNTER — Encounter: Payer: Self-pay | Admitting: Internal Medicine

## 2021-11-08 ENCOUNTER — Ambulatory Visit (INDEPENDENT_AMBULATORY_CARE_PROVIDER_SITE_OTHER): Payer: Medicare Other | Admitting: Internal Medicine

## 2021-11-08 ENCOUNTER — Encounter: Payer: Self-pay | Admitting: Internal Medicine

## 2021-11-08 VITALS — BP 118/66 | HR 80 | Temp 98.4°F | Ht 66.5 in | Wt 115.6 lb

## 2021-11-08 DIAGNOSIS — M5441 Lumbago with sciatica, right side: Secondary | ICD-10-CM | POA: Diagnosis not present

## 2021-11-08 DIAGNOSIS — F17200 Nicotine dependence, unspecified, uncomplicated: Secondary | ICD-10-CM

## 2021-11-08 DIAGNOSIS — G8929 Other chronic pain: Secondary | ICD-10-CM | POA: Diagnosis not present

## 2021-11-08 DIAGNOSIS — E559 Vitamin D deficiency, unspecified: Secondary | ICD-10-CM

## 2021-11-08 DIAGNOSIS — M5442 Lumbago with sciatica, left side: Secondary | ICD-10-CM

## 2021-11-08 DIAGNOSIS — M461 Sacroiliitis, not elsewhere classified: Secondary | ICD-10-CM | POA: Insufficient documentation

## 2021-11-08 MED ORDER — HYDROCODONE-ACETAMINOPHEN 10-325 MG PO TABS: 1.0000 | ORAL_TABLET | Freq: Four times a day (QID) | ORAL | 0 refills | Status: DC | PRN

## 2021-11-08 MED ORDER — OXYCODONE HCL 5 MG PO TABS: 5.0000 mg | ORAL_TABLET | Freq: Four times a day (QID) | ORAL | 0 refills | Status: DC | PRN

## 2021-11-08 NOTE — Progress Notes (Signed)
Patient ID: Rebecca Marks, female   DOB: 1956-03-07, 66 y.o.   MRN: [id number]        Chief Complaint: follow up persistent LBP, unable to see PCP until July 3       HPI:  Rebecca Marks is a 66 y.o. female here after failed LB surgury from July 2022 with Dr Vertell Limber, now retired, and now seeing Gayleen Orem NP, has been through PT and 2 back injections post surgury, the latest one last wk to left lower back, may have helped somewhat.  Has ongoing persistent LBP with bilateral sciatica right > left by hx, and chronic persistent sacral pain that just wont quit, Average pain maybe 810, today is 9.10.  Did have leg weakness and collapse at grocery store, no injury, conts to walk with cane, no walker use to date. Last MRI per pt about mar 2023.  Has appt sept 17 with Dr Davy Pique.  Also in process of renovating the home, and overseeing the subcontracter, then plans to sell and move, and already moved 25 boxes to smaller new home. Plans to finish moving by sept 2023. Has hydrocodone 10/325 every 6 hrs, no longer able to be rx per NS.   Also has oxcydone 5 mg prn breakthrough.  Has tried ice, heat, salon paz.  Also takng Vit D    Wt Readings from Last 3 Encounters:  11/08/21 115 lb 9.6 oz (52.4 kg)  09/04/21 122 lb (55.3 kg)  01/17/21 129 lb 12.8 oz (58.9 kg)   BP Readings from Last 3 Encounters:  11/08/21 118/66  09/04/21 92/60  01/17/21 110/82         Past Medical History:  Diagnosis Date   Allergy    Anxiety    Arthritis    R wrist and R knee   CAP (community acquired pneumonia) 4/17-22/15   Rochephin & IV Zithromax & 3 days Levaquin   Chronic back pain    DVT (deep venous thrombosis) ([place])    a. in her 20s while on OCPs.   Dyspnea    GERD (gastroesophageal reflux disease)    Hepatitis C    contaminated blood @ her job, prior notes indicate spontaneously cleared   History of tobacco abuse    Osteoarthritis    Palpitations    Premature atrial contractions    a. Holter 11/2013: few  PACs.   Pulmonary function study abnormality    a. 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   Raynaud phenomenon    Ruptured lumbar disc    4 herniated disc    Volume overload    a.  history of volume overload during hospital stay for PNA in 08/2013 felt iatrogenic due to receiving a lot of IV fluid. 2D echo 08/2013 showed EF 60-73%, normal diastolic function parameters.   Past Surgical History:  Procedure Laterality Date   CHOLECYSTECTOMY  1985   lumbar stem cell   2020    reports that she quit smoking about 8 years ago. Her smoking use included cigarettes. She has a 12.50 pack-year smoking history. She has never used smokeless tobacco. She reports current alcohol use. She reports that she does not use drugs. family history includes Alzheimer's disease in her maternal grandmother; Breast cancer in her mother and sister; Diabetes in her father; Heart attack in her father; Heart disease in her father; Hypertension in her father; Lymphoma in her father; Stroke in her maternal grandfather; Stroke (age of onset: 10) in her mother. Allergies  Allergen Reactions  Cymbalta [Duloxetine Hcl]     Unwilling to take   Ritalin [Methylphenidate Hcl]     Too hyper   Venlafaxine     GERD   Current Outpatient Medications on File Prior to Visit  Medication Sig Dispense Refill   albuterol (VENTOLIN HFA) 108 (90 Base) MCG/ACT inhaler Inhale 2 puffs into the lungs every 6 (six) hours as needed for wheezing or shortness of breath. 1 each 3   Ascorbic Acid (VITAMIN C) 1000 MG tablet Take 2,000 mg by mouth 2 (two) times daily as needed (immune system support).     Cholecalciferol (VITAMIN D3) 125 MCG (5000 UT) CAPS Take 10,000 Units by mouth every evening.     CORAL CALCIUM PO Take 1 Dose by mouth. Ionic Calcium (Mixed in bottle of water--sand formulation)     doxepin (SINEQUAN) 10 MG capsule TAKE 1 CAPSULE BY MOUTH AT BEDTIME. TAKE 1 CAPSULE BY MOUTH EVERY DAY AT BEDTIME AS NEEDED 90 capsule 1    gabapentin (NEURONTIN) 100 MG capsule Take 3 capsules (300 mg total) by mouth at bedtime. 360 capsule 2   methocarbamol (ROBAXIN) 500 MG tablet Take 1 tablet (500 mg total) by mouth every 6 (six) hours as needed for muscle spasms. 90 tablet 0   PHOSPHATIDYLSERINE PO Take 300 mg by mouth at bedtime.     VOLTAREN 1 % GEL Apply 1 application topically in the morning and at bedtime. Applied to both knees and lower back     zaleplon (SONATA) 10 MG capsule Take 1 capsule (10 mg total) by mouth at bedtime as needed for sleep. 90 capsule 1   No current facility-administered medications on file prior to visit.        ROS:  All others reviewed and negative.  Objective        PE:  BP 118/66 (BP Location: Right Arm, Patient Position: Sitting, Cuff Size: Normal)   Pulse 80   Temp 98.4 F (36.9 C) (Oral)   Ht 5' 6.5" (1.689 m)   Wt 115 lb 9.6 oz (52.4 kg)   SpO2 98%   BMI 18.38 kg/m                 Constitutional: Pt appears in NAD but in significant pain, walks with antalgic gait bent at waist               HENT: Head: NCAT.                Right Ear: External ear normal.                 Left Ear: External ear normal.                Eyes: . Pupils are equal, round, and reactive to light. Conjunctivae and EOM are normal               Nose: without d/c or deformity               Neck: Neck supple. Gross normal ROM               Cardiovascular: Normal rate and regular rhythm.                 Pulmonary/Chest: Effort normal and breath sounds without rales or wheezing.                              Neurological: Pt is alert.  At baseline orientation, motor grossly intact               Skin: Skin is warm. No rashes, no other new lesions, LE edema - none               Psychiatric: Pt behavior is normal without agitation   Micro: none  Cardiac tracings I have personally interpreted today:  none  Pertinent Radiological findings (summarize): Jul 29 2018 LS spine MRI IMPRESSION: 5 mm anterolisthesis  L4-5 with severe facet degeneration. Moderate spinal stenosis and moderate subarticular stenosis bilaterally   Mild degenerative changes throughout the remainder of the lumbar spine as detailed above.   Lab Results  Component Value Date   WBC 11.6 (H) 08/26/2021   HGB 15.0 08/26/2021   HCT 42.4 08/26/2021   PLT 262.0 08/26/2021   GLUCOSE 105 (H) 08/26/2021   CHOL 164 08/26/2021   TRIG 344.0 (H) 08/26/2021   HDL 38.00 (L) 08/26/2021   LDLDIRECT 92.0 08/26/2021   LDLCALC 90 04/21/2018   ALT 4 08/26/2021   AST 15 08/26/2021   NA 139 08/26/2021   K 3.7 08/26/2021   CL 102 08/26/2021   CREATININE 0.71 08/26/2021   BUN 19 08/26/2021   CO2 27 08/26/2021   TSH 1.86 08/26/2021   INR 1.0 09/12/2015   HGBA1C 4.4 (L) 07/30/2006   Assessment/Plan:  Dia Donate is a 66 y.o. White or Caucasian [1] female with  has a past medical history of Allergy, Anxiety, Arthritis, CAP (community acquired pneumonia) (4/17-22/15), Chronic back pain, DVT (deep venous thrombosis) ([place]), Dyspnea, GERD (gastroesophageal reflux disease), Hepatitis C, History of tobacco abuse, Osteoarthritis, Palpitations, Premature atrial contractions, Pulmonary function study abnormality, Raynaud phenomenon, Ruptured lumbar disc, and Volume overload.  Chronic low back pain Pt with worsening pain control now out of norco 10 325  Scheduled and oxycdone 5 prn breakthrough pain, ok for med refill , f/u pain management sept 17  Vitamin D deficiency Last vitamin D Lab Results  Component Value Date   VD25OH 51.67 04/21/2018   Stable, cont oral replacement   Tobacco dependence Pt back to smoking after having quit some years ago; d/w pt - pt counsled to quit, pt not ready  Followup: Return if symptoms worsen or fail to improve.  Cathlean Cower, MD 11/13/2021 4:22 AM [facility]

## 2021-11-13 ENCOUNTER — Encounter: Payer: Self-pay | Admitting: Internal Medicine

## 2021-11-13 NOTE — Assessment & Plan Note (Signed)
Pt back to smoking after having quit some years ago; d/w pt - pt counsled to quit, pt not ready

## 2021-11-13 NOTE — Assessment & Plan Note (Signed)
Last vitamin D Lab Results  Component Value Date   VD25OH 51.67 04/21/2018   Stable, cont oral replacement

## 2021-11-13 NOTE — Assessment & Plan Note (Signed)
Pt with worsening pain control now out of norco 10 325  Scheduled and oxycdone 5 prn breakthrough pain, ok for med refill , f/u pain management sept 17

## 2021-11-18 ENCOUNTER — Encounter: Payer: Self-pay | Admitting: Internal Medicine

## 2021-11-18 ENCOUNTER — Ambulatory Visit (INDEPENDENT_AMBULATORY_CARE_PROVIDER_SITE_OTHER): Payer: Medicare Other | Admitting: Internal Medicine

## 2021-11-18 DIAGNOSIS — M5441 Lumbago with sciatica, right side: Secondary | ICD-10-CM

## 2021-11-18 DIAGNOSIS — R5382 Chronic fatigue, unspecified: Secondary | ICD-10-CM | POA: Diagnosis not present

## 2021-11-18 DIAGNOSIS — F439 Reaction to severe stress, unspecified: Secondary | ICD-10-CM

## 2021-11-18 DIAGNOSIS — M5442 Lumbago with sciatica, left side: Secondary | ICD-10-CM

## 2021-11-18 DIAGNOSIS — G8929 Other chronic pain: Secondary | ICD-10-CM | POA: Diagnosis not present

## 2021-11-18 MED ORDER — HYDROCODONE-ACETAMINOPHEN 10-325 MG PO TABS: 1.0000 | ORAL_TABLET | Freq: Two times a day (BID) | ORAL | 0 refills | Status: DC | PRN

## 2021-11-18 MED ORDER — OXYCODONE HCL 5 MG PO TABS: 5.0000 mg | ORAL_TABLET | Freq: Two times a day (BID) | ORAL | 0 refills | Status: DC | PRN

## 2021-11-18 NOTE — Progress Notes (Signed)
Subjective:  Patient ID: Rebecca Marks, female    DOB: 1956-01-28  Age: 66 y.o. MRN: [id number]  CC: Back Pain (Severe nerve damage from July surgery, ongoing back pain rated 8 out of 10 on pain scale)   HPI Trenese Bedingfield presents for severe LBP (Severe nerve pain from July surgery, ongoing back pain rated 8 out of 10 on pain scale). S/p multiple LS injections.  Outpatient Medications Prior to Visit  Medication Sig Dispense Refill   albuterol (VENTOLIN HFA) 108 (90 Base) MCG/ACT inhaler Inhale 2 puffs into the lungs every 6 (six) hours as needed for wheezing or shortness of breath. 1 each 3   Ascorbic Acid (VITAMIN C) 1000 MG tablet Take 2,000 mg by mouth 2 (two) times daily as needed (immune system support).     Cholecalciferol (VITAMIN D3) 125 MCG (5000 UT) CAPS Take 10,000 Units by mouth every evening.     CORAL CALCIUM PO Take 1 Dose by mouth. Ionic Calcium (Mixed in bottle of water--sand formulation)     doxepin (SINEQUAN) 10 MG capsule TAKE 1 CAPSULE BY MOUTH AT BEDTIME. TAKE 1 CAPSULE BY MOUTH EVERY DAY AT BEDTIME AS NEEDED 90 capsule 1   gabapentin (NEURONTIN) 100 MG capsule Take 3 capsules (300 mg total) by mouth at bedtime. 360 capsule 2   methocarbamol (ROBAXIN) 500 MG tablet Take 1 tablet (500 mg total) by mouth every 6 (six) hours as needed for muscle spasms. 90 tablet 0   PHOSPHATIDYLSERINE PO Take 300 mg by mouth at bedtime.     VOLTAREN 1 % GEL Apply 1 application topically in the morning and at bedtime. Applied to both knees and lower back     zaleplon (SONATA) 10 MG capsule Take 1 capsule (10 mg total) by mouth at bedtime as needed for sleep. 90 capsule 1   HYDROcodone-acetaminophen (NORCO) 10-325 MG tablet Take 1 tablet by mouth every 6 (six) hours as needed for severe pain. 30 tablet 0   oxyCODONE (ROXICODONE) 5 MG immediate release tablet Take 1 tablet (5 mg total) by mouth every 6 (six) hours as needed for severe pain or breakthrough pain. 30 tablet 0   No  facility-administered medications prior to visit.    ROS: Review of Systems  Constitutional:  Negative for activity change, appetite change, chills, fatigue and unexpected weight change.  HENT:  Negative for congestion, mouth sores and sinus pressure.   Eyes:  Negative for visual disturbance.  Respiratory:  Negative for cough and chest tightness.   Gastrointestinal:  Negative for abdominal pain and nausea.  Genitourinary:  Negative for difficulty urinating, frequency and vaginal pain.  Musculoskeletal:  Positive for back pain and gait problem.  Skin:  Negative for pallor and rash.  Neurological:  Negative for dizziness, tremors, weakness, numbness and headaches.  Psychiatric/Behavioral:  Negative for confusion and sleep disturbance.   Using a cane  Objective:  BP 122/76 (BP Location: Left Arm, Patient Position: Sitting, Cuff Size: Large)   Pulse 90   Temp 97.7 F (36.5 C) (Temporal)   Ht 5' 6.5" (1.689 m)   Wt 115 lb (52.2 kg)   SpO2 98%   BMI 18.28 kg/m   BP Readings from Last 3 Encounters:  11/18/21 122/76  11/08/21 118/66  09/04/21 92/60    Wt Readings from Last 3 Encounters:  11/18/21 115 lb (52.2 kg)  11/08/21 115 lb 9.6 oz (52.4 kg)  09/04/21 122 lb (55.3 kg)    Physical Exam Constitutional:      General:  She is not in acute distress.    Appearance: Normal appearance. She is well-developed.  HENT:     Head: Normocephalic.     Right Ear: External ear normal.     Left Ear: External ear normal.     Nose: Nose normal.  Eyes:     General:        Right eye: No discharge.        Left eye: No discharge.     Conjunctiva/sclera: Conjunctivae normal.     Pupils: Pupils are equal, round, and reactive to light.  Neck:     Thyroid: No thyromegaly.     Vascular: No JVD.     Trachea: No tracheal deviation.  Cardiovascular:     Rate and Rhythm: Normal rate and regular rhythm.     Heart sounds: Normal heart sounds.  Pulmonary:     Effort: No respiratory distress.      Breath sounds: No stridor. No wheezing.  Abdominal:     General: Bowel sounds are normal. There is no distension.     Palpations: Abdomen is soft. There is no mass.     Tenderness: There is no abdominal tenderness. There is no guarding or rebound.  Musculoskeletal:        General: Tenderness present.     Cervical back: Normal range of motion and neck supple. No rigidity.  Lymphadenopathy:     Cervical: No cervical adenopathy.  Skin:    Findings: No erythema or rash.  Neurological:     Mental Status: She is oriented to person, place, and time.     Cranial Nerves: No cranial nerve deficit.     Motor: No abnormal muscle tone.     Coordination: Coordination abnormal.     Gait: Gait abnormal.     Deep Tendon Reflexes: Reflexes normal.  Psychiatric:        Behavior: Behavior normal.        Thought Content: Thought content normal.        Judgment: Judgment normal.   Antalgic gait LS w/pain  Lab Results  Component Value Date   WBC 11.6 (H) 08/26/2021   HGB 15.0 08/26/2021   HCT 42.4 08/26/2021   PLT 262.0 08/26/2021   GLUCOSE 105 (H) 08/26/2021   CHOL 164 08/26/2021   TRIG 344.0 (H) 08/26/2021   HDL 38.00 (L) 08/26/2021   LDLDIRECT 92.0 08/26/2021   LDLCALC 90 04/21/2018   ALT 4 08/26/2021   AST 15 08/26/2021   NA 139 08/26/2021   K 3.7 08/26/2021   CL 102 08/26/2021   CREATININE 0.71 08/26/2021   BUN 19 08/26/2021   CO2 27 08/26/2021   TSH 1.86 08/26/2021   INR 1.0 09/12/2015   HGBA1C 4.4 (L) 07/30/2006    DG Lumbar Spine 2-3 Views  Result Date: 11/20/2020 CLINICAL DATA:  Provided history: Elective surgery. Additional history provided: Lumbar 4-5 posterior lumbar interbody fusion. Provided fluoroscopy time 55 seconds (24.83 mGy). EXAM: LUMBAR SPINE - 2-3 VIEW; DG C-ARM 1-60 MIN COMPARISON:  Lumbar spine MRI 08/28/2020. Lumbar spine radiographs 09/10/2020. FINDINGS: P and lateral view intraoperative fluoroscopic images of the lumbar spine are submitted, 2 images total.  The lowest well-formed intervertebral disc space is designated L5-S1. On the provided images, bilateral pedicle screws are present at L4 and L5. Vertical interconnecting rods were not present at the time the images were taken. Interbody device(s) also present at L4-L5. IMPRESSION: Three intraoperative fluoroscopic images of the lumbar spine from L4-L5 posterior fusion, as described. Electronically  Signed   By: Kellie Simmering DO   On: 11/20/2020 10:24   DG C-Arm 1-60 Min  Result Date: 11/20/2020 CLINICAL DATA:  Provided history: Elective surgery. Additional history provided: Lumbar 4-5 posterior lumbar interbody fusion. Provided fluoroscopy time 55 seconds (24.83 mGy). EXAM: LUMBAR SPINE - 2-3 VIEW; DG C-ARM 1-60 MIN COMPARISON:  Lumbar spine MRI 08/28/2020. Lumbar spine radiographs 09/10/2020. FINDINGS: P and lateral view intraoperative fluoroscopic images of the lumbar spine are submitted, 2 images total. The lowest well-formed intervertebral disc space is designated L5-S1. On the provided images, bilateral pedicle screws are present at L4 and L5. Vertical interconnecting rods were not present at the time the images were taken. Interbody device(s) also present at L4-L5. IMPRESSION: Three intraoperative fluoroscopic images of the lumbar spine from L4-L5 posterior fusion, as described. Electronically Signed   By: Kellie Simmering DO   On: 11/20/2020 10:24    Assessment & Plan:   Problem List Items Addressed This Visit     Chronic low back pain     Lumbar four-five Posterior lumbar interbody fusion (N/A) with PEEK cages, autograft, pedicle screw fixation, posterolateral arthrodesis on 11/20/2020 Dr Vertell Limber retired 2023. S/p back surgery last July 2022. Dr Davy Pique Cont on Oxy and Norco - this combo has worked for years. Will cont on current Rx. Narcane at home  Potential benefits of a long term opioids use as well as potential risks (i.e. addiction risk, apnea etc) and complications (i.e. Somnolence, constipation  and others) were explained to the patient and were aknowledged.        Relevant Medications   HYDROcodone-acetaminophen (NORCO) 10-325 MG tablet   oxyCODONE (ROXICODONE) 5 MG immediate release tablet   Fatigue    Chronic pain      Stress at home    Moving again         Meds ordered this encounter  Medications   HYDROcodone-acetaminophen (NORCO) 10-325 MG tablet    Sig: Take 1 tablet by mouth 2 (two) times daily as needed for severe pain.    Dispense:  60 tablet    Refill:  0   oxyCODONE (ROXICODONE) 5 MG immediate release tablet    Sig: Take 1 tablet (5 mg total) by mouth 2 (two) times daily as needed for severe pain or breakthrough pain.    Dispense:  60 tablet    Refill:  0      Follow-up: No follow-ups on file.  Walker Kehr, MD

## 2021-11-18 NOTE — Assessment & Plan Note (Signed)
Chronic pain 

## 2021-11-18 NOTE — Assessment & Plan Note (Addendum)
Lumbar four-five Posterior lumbar interbody fusion (N/A) with PEEK cages, autograft, pedicle screw fixation, posterolateral arthrodesis on 11/20/2020 Dr Vertell Limber retired 2023. S/p back surgery last July 2022. Dr Davy Pique Cont on Oxy and Norco - this combo has worked for years. Will cont on current Rx. Narcane at home  Potential benefits of a long term opioids use as well as potential risks (i.e. addiction risk, apnea etc) and complications (i.e. Somnolence, constipation and others) were explained to the patient and were aknowledged.

## 2021-11-18 NOTE — Assessment & Plan Note (Signed)
Moving again

## 2021-12-18 ENCOUNTER — Encounter: Payer: Self-pay | Admitting: Internal Medicine

## 2021-12-18 NOTE — Telephone Encounter (Signed)
Check [place] registry last filled Hydrocodone 11/26/2021 and Oxycodone 11/18/21.Marland KitchenJohny Chess

## 2021-12-19 ENCOUNTER — Other Ambulatory Visit: Payer: Self-pay | Admitting: Internal Medicine

## 2021-12-19 MED ORDER — OXYCODONE HCL 5 MG PO TABS
5.0000 mg | ORAL_TABLET | Freq: Two times a day (BID) | ORAL | 0 refills | Status: DC | PRN
Start: 2021-12-19 — End: 2022-01-16

## 2021-12-19 MED ORDER — HYDROCODONE-ACETAMINOPHEN 10-325 MG PO TABS
1.0000 | ORAL_TABLET | Freq: Two times a day (BID) | ORAL | 0 refills | Status: DC | PRN
Start: 2021-12-19 — End: 2022-01-16

## 2021-12-19 MED ORDER — NALOXONE HCL 4 MG/0.1ML NA LIQD: NASAL | 2 refills | Status: DC

## 2021-12-20 ENCOUNTER — Other Ambulatory Visit: Payer: Self-pay | Admitting: Internal Medicine

## 2022-01-15 ENCOUNTER — Telehealth: Payer: Self-pay

## 2022-01-15 NOTE — Telephone Encounter (Signed)
Check [place] registry last filled Oxycodone 5 on 12/19/2021 & hydrocodone 12/23/2021.Marland KitchenJohny Chess

## 2022-01-15 NOTE — Telephone Encounter (Signed)
Pt requesting a refill on: HYDROcodone-acetaminophen (NORCO) 10-325 MG tablet oxyCODONE (ROXICODONE) 5 MG immediate release tablet  [place] [id number]OV 01/30/22

## 2022-01-16 MED ORDER — HYDROCODONE-ACETAMINOPHEN 10-325 MG PO TABS: 1.0000 | ORAL_TABLET | Freq: Two times a day (BID) | ORAL | 0 refills | Status: DC | PRN

## 2022-01-16 MED ORDER — OXYCODONE HCL 5 MG PO TABS: 5.0000 mg | ORAL_TABLET | Freq: Two times a day (BID) | ORAL | 0 refills | Status: DC | PRN

## 2022-01-16 NOTE — Telephone Encounter (Signed)
Pt has appt 01/30/22.Marland KitchenJohny Marks

## 2022-01-16 NOTE — Telephone Encounter (Signed)
Okay.  Done. Schedule office visit in October please. Thanks

## 2022-01-30 ENCOUNTER — Ambulatory Visit (INDEPENDENT_AMBULATORY_CARE_PROVIDER_SITE_OTHER): Payer: Medicare Other | Admitting: Internal Medicine

## 2022-01-30 ENCOUNTER — Encounter: Payer: Self-pay | Admitting: Internal Medicine

## 2022-01-30 DIAGNOSIS — G8929 Other chronic pain: Secondary | ICD-10-CM

## 2022-01-30 DIAGNOSIS — M5442 Lumbago with sciatica, left side: Secondary | ICD-10-CM

## 2022-01-30 DIAGNOSIS — F5101 Primary insomnia: Secondary | ICD-10-CM

## 2022-01-30 DIAGNOSIS — M5441 Lumbago with sciatica, right side: Secondary | ICD-10-CM

## 2022-01-30 MED ORDER — OXYCODONE HCL 5 MG PO TABS: 5.0000 mg | ORAL_TABLET | Freq: Two times a day (BID) | ORAL | 0 refills | Status: DC | PRN

## 2022-01-30 MED ORDER — HYDROCODONE-ACETAMINOPHEN 10-325 MG PO TABS: 1.0000 | ORAL_TABLET | Freq: Two times a day (BID) | ORAL | 0 refills | Status: DC | PRN

## 2022-01-30 MED ORDER — ZALEPLON 10 MG PO CAPS: 10.0000 mg | ORAL_CAPSULE | Freq: Every evening | ORAL | 1 refills | Status: DC | PRN

## 2022-01-30 NOTE — Assessment & Plan Note (Signed)
On doxepin.  Zaleplon prn 2019  Potential benefits of a long term benzodiazepines  use as well as potential risks  and complications were explained to the patient and were aknowledged.

## 2022-01-30 NOTE — Assessment & Plan Note (Signed)
Cont on Oxy and Norco - this combo has worked for years. Will cont on current Rx. Narcane at home  Potential benefits of a long term opioids use as well as potential risks (i.e. addiction risk, apnea etc) and complications (i.e. Somnolence, constipation and others) were explained to the patient and were aknowledged.

## 2022-01-30 NOTE — Progress Notes (Signed)
Subjective:  Patient ID: Rebecca Marks, female    DOB: Sep 16, 1955  Age: 66 y.o. MRN: [id number]  CC: Follow-up (4 week f/u)   HPI Rebecca Marks presents for chronic LBP, insomnia - pt lost her Rx in the move  Outpatient Medications Prior to Visit  Medication Sig Dispense Refill   albuterol (VENTOLIN HFA) 108 (90 Base) MCG/ACT inhaler Inhale 2 puffs into the lungs every 6 (six) hours as needed for wheezing or shortness of breath. 1 each 3   Ascorbic Acid (VITAMIN C) 1000 MG tablet Take 2,000 mg by mouth 2 (two) times daily as needed (immune system support).     Cholecalciferol (VITAMIN D3) 125 MCG (5000 UT) CAPS Take 10,000 Units by mouth every evening.     CORAL CALCIUM PO Take 1 Dose by mouth. Ionic Calcium (Mixed in bottle of water--sand formulation)     doxepin (SINEQUAN) 10 MG capsule TAKE 1 CAPSULE BY MOUTH AT BEDTIME AS NEEDED 90 capsule 1   gabapentin (NEURONTIN) 100 MG capsule Take 3 capsules (300 mg total) by mouth at bedtime. 360 capsule 2   methocarbamol (ROBAXIN) 500 MG tablet Take 1 tablet (500 mg total) by mouth every 6 (six) hours as needed for muscle spasms. 90 tablet 0   naloxone (NARCAN) nasal spray 4 mg/0.1 mL 1 actuation in one nostril once. May repeat in 2-3 min 1 each 2   PHOSPHATIDYLSERINE PO Take 300 mg by mouth at bedtime.     VOLTAREN 1 % GEL Apply 1 application topically in the morning and at bedtime. Applied to both knees and lower back     HYDROcodone-acetaminophen (NORCO) 10-325 MG tablet Take 1 tablet by mouth 2 (two) times daily as needed for severe pain. 60 tablet 0   oxyCODONE (ROXICODONE) 5 MG immediate release tablet Take 1 tablet (5 mg total) by mouth 2 (two) times daily as needed for severe pain or breakthrough pain. 60 tablet 0   zaleplon (SONATA) 10 MG capsule Take 1 capsule (10 mg total) by mouth at bedtime as needed for sleep. 90 capsule 1   No facility-administered medications prior to visit.    ROS: Review of Systems   Constitutional:  Negative for activity change, appetite change, chills, fatigue and unexpected weight change.  HENT:  Negative for congestion, mouth sores and sinus pressure.   Eyes:  Negative for visual disturbance.  Respiratory:  Negative for cough and chest tightness.   Gastrointestinal:  Negative for abdominal pain and nausea.  Genitourinary:  Negative for difficulty urinating, frequency and vaginal pain.  Musculoskeletal:  Positive for back pain and gait problem.  Skin:  Negative for pallor and rash.  Neurological:  Negative for dizziness, tremors, weakness, numbness and headaches.  Psychiatric/Behavioral:  Positive for sleep disturbance. Negative for confusion.     Objective:  BP 112/72 (BP Location: Left Arm)   Pulse 68   Temp 98.2 F (36.8 C) (Oral)   Ht 5' 6.5" (1.689 m)   Wt 114 lb 4.8 oz (51.8 kg)   SpO2 96%   BMI 18.17 kg/m   BP Readings from Last 3 Encounters:  01/30/22 112/72  11/18/21 122/76  11/08/21 118/66    Wt Readings from Last 3 Encounters:  01/30/22 114 lb 4.8 oz (51.8 kg)  11/18/21 115 lb (52.2 kg)  11/08/21 115 lb 9.6 oz (52.4 kg)    Physical Exam Constitutional:      General: She is not in acute distress.    Appearance: Normal appearance. She is well-developed.  HENT:     Head: Normocephalic.     Right Ear: External ear normal.     Left Ear: External ear normal.     Nose: Nose normal.  Eyes:     General:        Right eye: No discharge.        Left eye: No discharge.     Conjunctiva/sclera: Conjunctivae normal.     Pupils: Pupils are equal, round, and reactive to light.  Neck:     Thyroid: No thyromegaly.     Vascular: No JVD.     Trachea: No tracheal deviation.  Cardiovascular:     Rate and Rhythm: Normal rate and regular rhythm.     Heart sounds: Normal heart sounds.  Pulmonary:     Effort: No respiratory distress.     Breath sounds: No stridor. No wheezing.  Abdominal:     General: Bowel sounds are normal. There is no  distension.     Palpations: Abdomen is soft. There is no mass.     Tenderness: There is no abdominal tenderness. There is no guarding or rebound.  Musculoskeletal:        General: No tenderness.     Cervical back: Normal range of motion and neck supple. No rigidity.  Lymphadenopathy:     Cervical: No cervical adenopathy.  Skin:    Findings: No erythema or rash.  Neurological:     Cranial Nerves: No cranial nerve deficit.     Motor: No abnormal muscle tone.     Coordination: Coordination normal.     Deep Tendon Reflexes: Reflexes normal.  Psychiatric:        Behavior: Behavior normal.        Thought Content: Thought content normal.        Judgment: Judgment normal.   Using a cane LS w/pain  Lab Results  Component Value Date   WBC 11.6 (H) 08/26/2021   HGB 15.0 08/26/2021   HCT 42.4 08/26/2021   PLT 262.0 08/26/2021   GLUCOSE 105 (H) 08/26/2021   CHOL 164 08/26/2021   TRIG 344.0 (H) 08/26/2021   HDL 38.00 (L) 08/26/2021   LDLDIRECT 92.0 08/26/2021   LDLCALC 90 04/21/2018   ALT 4 08/26/2021   AST 15 08/26/2021   NA 139 08/26/2021   K 3.7 08/26/2021   CL 102 08/26/2021   CREATININE 0.71 08/26/2021   BUN 19 08/26/2021   CO2 27 08/26/2021   TSH 1.86 08/26/2021   INR 1.0 09/12/2015   HGBA1C 4.4 (L) 07/30/2006    DG Lumbar Spine 2-3 Views  Result Date: 11/20/2020 CLINICAL DATA:  Provided history: Elective surgery. Additional history provided: Lumbar 4-5 posterior lumbar interbody fusion. Provided fluoroscopy time 55 seconds (24.83 mGy). EXAM: LUMBAR SPINE - 2-3 VIEW; DG C-ARM 1-60 MIN COMPARISON:  Lumbar spine MRI 08/28/2020. Lumbar spine radiographs 09/10/2020. FINDINGS: P and lateral view intraoperative fluoroscopic images of the lumbar spine are submitted, 2 images total. The lowest well-formed intervertebral disc space is designated L5-S1. On the provided images, bilateral pedicle screws are present at L4 and L5. Vertical interconnecting rods were not present at the time  the images were taken. Interbody device(s) also present at L4-L5. IMPRESSION: Three intraoperative fluoroscopic images of the lumbar spine from L4-L5 posterior fusion, as described. Electronically Signed   By: Kellie Simmering DO   On: 11/20/2020 10:24   DG C-Arm 1-60 Min  Result Date: 11/20/2020 CLINICAL DATA:  Provided history: Elective surgery. Additional history provided: Lumbar 4-5 posterior  lumbar interbody fusion. Provided fluoroscopy time 55 seconds (24.83 mGy). EXAM: LUMBAR SPINE - 2-3 VIEW; DG C-ARM 1-60 MIN COMPARISON:  Lumbar spine MRI 08/28/2020. Lumbar spine radiographs 09/10/2020. FINDINGS: P and lateral view intraoperative fluoroscopic images of the lumbar spine are submitted, 2 images total. The lowest well-formed intervertebral disc space is designated L5-S1. On the provided images, bilateral pedicle screws are present at L4 and L5. Vertical interconnecting rods were not present at the time the images were taken. Interbody device(s) also present at L4-L5. IMPRESSION: Three intraoperative fluoroscopic images of the lumbar spine from L4-L5 posterior fusion, as described. Electronically Signed   By: Kellie Simmering DO   On: 11/20/2020 10:24    Assessment & Plan:   Problem List Items Addressed This Visit     Chronic low back pain    Cont on Oxy and Norco - this combo has worked for years. Will cont on current Rx. Narcane at home  Potential benefits of a long term opioids use as well as potential risks (i.e. addiction risk, apnea etc) and complications (i.e. Somnolence, constipation and others) were explained to the patient and were aknowledged.      Relevant Medications   oxyCODONE (ROXICODONE) 5 MG immediate release tablet   HYDROcodone-acetaminophen (NORCO) 10-325 MG tablet   Insomnia    On doxepin.  Zaleplon prn 2019  Potential benefits of a long term benzodiazepines  use as well as potential risks  and complications were explained to the patient and were aknowledged.         Meds  ordered this encounter  Medications   oxyCODONE (ROXICODONE) 5 MG immediate release tablet    Sig: Take 1 tablet (5 mg total) by mouth 2 (two) times daily as needed for severe pain or breakthrough pain.    Dispense:  60 tablet    Refill:  0   HYDROcodone-acetaminophen (NORCO) 10-325 MG tablet    Sig: Take 1 tablet by mouth 2 (two) times daily as needed for severe pain.    Dispense:  60 tablet    Refill:  0   zaleplon (SONATA) 10 MG capsule    Sig: Take 1 capsule (10 mg total) by mouth at bedtime as needed for sleep.    Dispense:  90 capsule    Refill:  1    [facility] to fill early (Rx was lost in the move)      Follow-up: Return in about 2 months (around 04/01/2022) for a follow-up visit.  Walker Kehr, MD

## 2022-03-17 ENCOUNTER — Ambulatory Visit (INDEPENDENT_AMBULATORY_CARE_PROVIDER_SITE_OTHER): Payer: Medicare Other | Admitting: Internal Medicine

## 2022-03-17 ENCOUNTER — Encounter: Payer: Self-pay | Admitting: Internal Medicine

## 2022-03-17 ENCOUNTER — Telehealth: Payer: Self-pay | Admitting: Internal Medicine

## 2022-03-17 DIAGNOSIS — M5442 Lumbago with sciatica, left side: Secondary | ICD-10-CM | POA: Diagnosis not present

## 2022-03-17 DIAGNOSIS — M5441 Lumbago with sciatica, right side: Secondary | ICD-10-CM | POA: Diagnosis not present

## 2022-03-17 DIAGNOSIS — R634 Abnormal weight loss: Secondary | ICD-10-CM

## 2022-03-17 DIAGNOSIS — G8929 Other chronic pain: Secondary | ICD-10-CM

## 2022-03-17 MED ORDER — FAMOTIDINE 40 MG PO TABS: 40.0000 mg | ORAL_TABLET | Freq: Every day | ORAL | 3 refills | Status: DC

## 2022-03-17 MED ORDER — HYDROCODONE-ACETAMINOPHEN 10-325 MG PO TABS: 1.0000 | ORAL_TABLET | ORAL | 0 refills | Status: DC | PRN

## 2022-03-17 MED ORDER — DICLOFENAC SODIUM 75 MG PO TBEC: 75.0000 mg | DELAYED_RELEASE_TABLET | Freq: Two times a day (BID) | ORAL | 3 refills | Status: DC

## 2022-03-17 NOTE — Assessment & Plan Note (Signed)
Worse Start PT Start Diclofenac w/Pepcid Increase Norco to qid prn NS appt is pending

## 2022-03-17 NOTE — Progress Notes (Signed)
Subjective:  Patient ID: Rebecca Marks, female    DOB: Oct 09, 1955  Age: 66 y.o. MRN: [id number]  CC: Weight Loss   HPI Rebecca Marks presents for severe pain; Norco is working for 2 hrs only C/o wt loss. Pt quit smoking last week. Pain is 10/10.Can't sleep  Outpatient Medications Prior to Visit  Medication Sig Dispense Refill   albuterol (VENTOLIN HFA) 108 (90 Base) MCG/ACT inhaler Inhale 2 puffs into the lungs every 6 (six) hours as needed for wheezing or shortness of breath. 1 each 3   Ascorbic Acid (VITAMIN C) 1000 MG tablet Take 2,000 mg by mouth 2 (two) times daily as needed (immune system support).     Cholecalciferol (VITAMIN D3) 125 MCG (5000 UT) CAPS Take 10,000 Units by mouth every evening.     CORAL CALCIUM PO Take 1 Dose by mouth. Ionic Calcium (Mixed in bottle of water--sand formulation)     doxepin (SINEQUAN) 10 MG capsule TAKE 1 CAPSULE BY MOUTH AT BEDTIME AS NEEDED 90 capsule 1   gabapentin (NEURONTIN) 100 MG capsule Take 3 capsules (300 mg total) by mouth at bedtime. 360 capsule 2   methocarbamol (ROBAXIN) 500 MG tablet Take 1 tablet (500 mg total) by mouth every 6 (six) hours as needed for muscle spasms. 90 tablet 0   naloxone (NARCAN) nasal spray 4 mg/0.1 mL 1 actuation in one nostril once. May repeat in 2-3 min 1 each 2   PHOSPHATIDYLSERINE PO Take 300 mg by mouth at bedtime.     VOLTAREN 1 % GEL Apply 1 application topically in the morning and at bedtime. Applied to both knees and lower back     zaleplon (SONATA) 10 MG capsule Take 1 capsule (10 mg total) by mouth at bedtime as needed for sleep. 90 capsule 1   HYDROcodone-acetaminophen (NORCO) 10-325 MG tablet Take 1 tablet by mouth 2 (two) times daily as needed for severe pain. 60 tablet 0   oxyCODONE (ROXICODONE) 5 MG immediate release tablet Take 1 tablet (5 mg total) by mouth 2 (two) times daily as needed for severe pain or breakthrough pain. 60 tablet 0   No facility-administered medications prior to  visit.    ROS: Review of Systems  Constitutional:  Positive for unexpected weight change. Negative for activity change, appetite change, chills and fatigue.  HENT:  Negative for congestion, mouth sores and sinus pressure.   Eyes:  Negative for visual disturbance.  Respiratory:  Negative for cough and chest tightness.   Gastrointestinal:  Negative for abdominal pain and nausea.  Genitourinary:  Negative for difficulty urinating, frequency and vaginal pain.  Musculoskeletal:  Positive for back pain and gait problem.  Skin:  Negative for pallor and rash.  Neurological:  Negative for dizziness, tremors, weakness, numbness and headaches.  Psychiatric/Behavioral:  Negative for confusion, sleep disturbance and suicidal ideas.     Objective:  BP 112/72 (BP Location: Left Arm)   Pulse 77   Temp (!) 97.5 F (36.4 C) (Oral)   Ht 5' 3.5" (1.613 m)   Wt 109 lb (49.4 kg)   SpO2 98%   BMI 19.01 kg/m   BP Readings from Last 3 Encounters:  03/17/22 112/72  01/30/22 112/72  11/18/21 122/76    Wt Readings from Last 3 Encounters:  03/17/22 109 lb (49.4 kg)  01/30/22 114 lb 4.8 oz (51.8 kg)  11/18/21 115 lb (52.2 kg)    Physical Exam Constitutional:      General: She is in acute distress.  Appearance: She is well-developed.  HENT:     Head: Normocephalic.     Right Ear: External ear normal.     Left Ear: External ear normal.     Nose: Nose normal.  Eyes:     General:        Right eye: No discharge.        Left eye: No discharge.     Conjunctiva/sclera: Conjunctivae normal.     Pupils: Pupils are equal, round, and reactive to light.  Neck:     Thyroid: No thyromegaly.     Vascular: No JVD.     Trachea: No tracheal deviation.  Cardiovascular:     Rate and Rhythm: Normal rate and regular rhythm.     Heart sounds: Normal heart sounds.  Pulmonary:     Effort: No respiratory distress.     Breath sounds: No stridor. No wheezing.  Abdominal:     General: Bowel sounds are  normal. There is no distension.     Palpations: Abdomen is soft. There is no mass.     Tenderness: There is no abdominal tenderness. There is no guarding or rebound.  Musculoskeletal:        General: Tenderness present.     Cervical back: Normal range of motion and neck supple. No rigidity.  Lymphadenopathy:     Cervical: No cervical adenopathy.  Skin:    Findings: No erythema or rash.  Neurological:     Cranial Nerves: No cranial nerve deficit.     Motor: No abnormal muscle tone.     Coordination: Coordination normal.     Deep Tendon Reflexes: Reflexes normal.  Psychiatric:        Behavior: Behavior normal.        Thought Content: Thought content normal.        Judgment: Judgment normal.   Very uncomfortable Thin  Lab Results  Component Value Date   WBC 11.6 (H) 08/26/2021   HGB 15.0 08/26/2021   HCT 42.4 08/26/2021   PLT 262.0 08/26/2021   GLUCOSE 105 (H) 08/26/2021   CHOL 164 08/26/2021   TRIG 344.0 (H) 08/26/2021   HDL 38.00 (L) 08/26/2021   LDLDIRECT 92.0 08/26/2021   LDLCALC 90 04/21/2018   ALT 4 08/26/2021   AST 15 08/26/2021   NA 139 08/26/2021   K 3.7 08/26/2021   CL 102 08/26/2021   CREATININE 0.71 08/26/2021   BUN 19 08/26/2021   CO2 27 08/26/2021   TSH 1.86 08/26/2021   INR 1.0 09/12/2015   HGBA1C 4.4 (L) 07/30/2006    DG Lumbar Spine 2-3 Views  Result Date: 11/20/2020 CLINICAL DATA:  Provided history: Elective surgery. Additional history provided: Lumbar 4-5 posterior lumbar interbody fusion. Provided fluoroscopy time 55 seconds (24.83 mGy). EXAM: LUMBAR SPINE - 2-3 VIEW; DG C-ARM 1-60 MIN COMPARISON:  Lumbar spine MRI 08/28/2020. Lumbar spine radiographs 09/10/2020. FINDINGS: P and lateral view intraoperative fluoroscopic images of the lumbar spine are submitted, 2 images total. The lowest well-formed intervertebral disc space is designated L5-S1. On the provided images, bilateral pedicle screws are present at L4 and L5. Vertical interconnecting rods  were not present at the time the images were taken. Interbody device(s) also present at L4-L5. IMPRESSION: Three intraoperative fluoroscopic images of the lumbar spine from L4-L5 posterior fusion, as described. Electronically Signed   By: Kellie Simmering DO   On: 11/20/2020 10:24   DG C-Arm 1-60 Min  Result Date: 11/20/2020 CLINICAL DATA:  Provided history: Elective surgery. Additional history provided:  Lumbar 4-5 posterior lumbar interbody fusion. Provided fluoroscopy time 55 seconds (24.83 mGy). EXAM: LUMBAR SPINE - 2-3 VIEW; DG C-ARM 1-60 MIN COMPARISON:  Lumbar spine MRI 08/28/2020. Lumbar spine radiographs 09/10/2020. FINDINGS: P and lateral view intraoperative fluoroscopic images of the lumbar spine are submitted, 2 images total. The lowest well-formed intervertebral disc space is designated L5-S1. On the provided images, bilateral pedicle screws are present at L4 and L5. Vertical interconnecting rods were not present at the time the images were taken. Interbody device(s) also present at L4-L5. IMPRESSION: Three intraoperative fluoroscopic images of the lumbar spine from L4-L5 posterior fusion, as described. Electronically Signed   By: Kellie Simmering DO   On: 11/20/2020 10:24    Assessment & Plan:   Problem List Items Addressed This Visit     Chronic low back pain    Worse Start PT Start Diclofenac w/Pepcid Increase Norco to qid prn NS appt is pending       Relevant Medications   HYDROcodone-acetaminophen (NORCO) 10-325 MG tablet   diclofenac (VOLTAREN) 75 MG EC tablet   Other Relevant Orders   Ambulatory referral to Physical Therapy      Meds ordered this encounter  Medications   HYDROcodone-acetaminophen (NORCO) 10-325 MG tablet    Sig: Take 1 tablet by mouth every 4 (four) hours as needed for severe pain.    Dispense:  120 tablet    Refill:  0    Please fill on or after 03/17/22   diclofenac (VOLTAREN) 75 MG EC tablet    Sig: Take 1 tablet (75 mg total) by mouth 2 (two) times  daily.    Dispense:  60 tablet    Refill:  3   famotidine (PEPCID) 40 MG tablet    Sig: Take 1 tablet (40 mg total) by mouth daily.    Dispense:  30 tablet    Refill:  3      Follow-up: Return in about 2 months (around 05/17/2022) for a follow-up visit.  Walker Kehr, MD

## 2022-03-17 NOTE — Telephone Encounter (Signed)
Patient states that the pharmacy is out of stock on the   HYDROcodone-acetaminophen ([place]) 10-325 MG tablet   and she is wondering if provider would be willing to send in Oxycodone instead, in 10 day increments, until the  HYDROcodone-acetaminophen (NORCO) 10-325 MG tablet  is back in stock. Call back number is [phone].

## 2022-03-17 NOTE — Assessment & Plan Note (Signed)
Pt stopped smoking last week

## 2022-03-19 MED ORDER — OXYCODONE-ACETAMINOPHEN 5-325 MG PO TABS: 1.0000 | ORAL_TABLET | Freq: Four times a day (QID) | ORAL | 0 refills | Status: DC | PRN

## 2022-03-19 NOTE — Telephone Encounter (Signed)
Okay.  It is done.  Thanks

## 2022-03-19 NOTE — Telephone Encounter (Signed)
Notified pt MD sent rx for oxycodone.Marland KitchenJohny Marks

## 2022-04-02 ENCOUNTER — Other Ambulatory Visit: Payer: Self-pay

## 2022-04-02 ENCOUNTER — Ambulatory Visit: Payer: Medicare Other | Attending: Internal Medicine

## 2022-04-02 DIAGNOSIS — M5441 Lumbago with sciatica, right side: Secondary | ICD-10-CM | POA: Insufficient documentation

## 2022-04-02 DIAGNOSIS — M6281 Muscle weakness (generalized): Secondary | ICD-10-CM | POA: Insufficient documentation

## 2022-04-02 DIAGNOSIS — G8929 Other chronic pain: Secondary | ICD-10-CM | POA: Diagnosis present

## 2022-04-02 DIAGNOSIS — M5442 Lumbago with sciatica, left side: Secondary | ICD-10-CM | POA: Insufficient documentation

## 2022-04-02 NOTE — Therapy (Signed)
OUTPATIENT PHYSICAL THERAPY THORACOLUMBAR EVALUATION   Patient Name: Rebecca Marks MRN: [id number] DOB:August 12, 1955, 66 y.o., female Today's Date: 04/02/2022   PT End of Session - 04/02/22 1337     Visit Number 1    Number of Visits 9    Date for PT Re-Evaluation 06/04/22    Authorization Type MCR    Authorization Time Period FOTO v6, v10, kx mod v15    Progress Note Due on Visit 10    PT Start Time 1312    PT Stop Time 1355    PT Time Calculation (min) 43 min    Activity Tolerance Patient tolerated treatment well;Patient limited by pain    Behavior During Therapy WFL for tasks assessed/performed             Past Medical History:  Diagnosis Date   Allergy    Anxiety    Arthritis    R wrist and R knee   CAP (community acquired pneumonia) 4/17-22/15   Rochephin & IV Zithromax & 3 days Levaquin   Chronic back pain    DVT (deep venous thrombosis) ([place])    a. in her 20s while on OCPs.   Dyspnea    GERD (gastroesophageal reflux disease)    Hepatitis C    contaminated blood @ her job, prior notes indicate spontaneously cleared   History of tobacco abuse    Osteoarthritis    Palpitations    Premature atrial contractions    a. Holter 11/2013: few PACs.   Pulmonary function study abnormality    a. 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   Raynaud phenomenon    Ruptured lumbar disc    4 herniated disc    Volume overload    a.  history of volume overload during hospital stay for PNA in 08/2013 felt iatrogenic due to receiving a lot of IV fluid. 2D echo 08/2013 showed EF 08-67%, normal diastolic function parameters.   Past Surgical History:  Procedure Laterality Date   CHOLECYSTECTOMY  1985   lumbar stem cell   2020   Patient Active Problem List   Diagnosis Date Noted   Weight loss 03/17/2022   Inflammation of sacroiliac joint ([place]) 11/08/2021   Degeneration of lumbar intervertebral disc 09/04/2021   Spondylolisthesis of lumbar region 11/20/2020    Tobacco dependence 07/23/2020   Cough 04/28/2020   Cat scratch 03/04/2020   Eczema 01/02/2020   Patellofemoral arthritis of right knee 09/09/2019   Viral syndrome 11/23/2018   Degenerative lumbar spinal stenosis 11/16/2018   Well adult exam 04/21/2018   Anxiety 03/03/2018   Anserine bursitis 10/15/2017   Travel advice encounter 08/31/2016   Insomnia 08/26/2016   Postconcussive syndrome 07/24/2016   Somnolence 07/23/2016   Syncope and collapse 07/01/2016   Concussion 07/01/2016   Febrile illness, acute 07/01/2016   Respiratory infection 05/20/2016   Stress at home 02/08/2016   Vitamin D deficiency 09/11/2015   Routine general medical examination at a health care facility 09/11/2015   Allergic rhinitis due to pollen 09/11/2015   Premature atrial contractions    Chronic low back pain 08/21/2014   Nonallopathic lesion of lumbosacral region 08/21/2014   Nonallopathic lesion of thoracic region 08/21/2014   Nonallopathic lesion of sacral region 08/21/2014   Fatigue 09/05/2013   Osteoarthritis 08/02/2013   Raynaud phenomenon 08/02/2013   Hep C w/o coma, chronic ([place]) 05/24/2010    PCP: Cassandria Anger, MD   REFERRING PROVIDER: Cassandria Anger, MD   REFERRING DIAG: M54.42,M54.41,G89.29 (ICD-10-CM) - Chronic  bilateral low back pain with bilateral sciatica   Rationale for Evaluation and Treatment: Rehabilitation  THERAPY DIAG:  Chronic bilateral low back pain with bilateral sciatica - Plan: PT plan of care cert/re-cert  Muscle weakness (generalized) - Plan: PT plan of care cert/re-cert  ONSET DATE: 30 years ago  SUBJECTIVE:                                                                                                                                                                                           SUBJECTIVE STATEMENT: Pt reports primary c/o central LBP and BIL intermittent LE pain lasting 30 years. She reports her back pain is worse than her leg pain. She  denies any N/T related to this problem. The pain travels down BIL LE to the heels. Pt reports she broke her coccyx in high school and has had L4-L5 posterior lumbar interbody fusion on 11/20/2020 to correct a spondylolisthesis. Pt also has been diagnosed with moderate canal and foraminal stenosis. Pt reports recent exacerbation of LBP in March of 2022, leading to her surgery. However, the pain has not improved since her surgery. Current pain is 8/10. Worst pain is 10/10. Best pain is 8/10. Aggravating factors include sitting > 15 minutes, standing >2 minutes, stairs. Easing factors include positional change, hydrocodone, massage, heat. Pt reports a 20-pound weight loss since her surgery last year, which she attributes to increased pain. Pt denies any unrelenting night pain, nausea/ vomiting, changes in bowel/ bladder function.   PERTINENT HISTORY:  Hx of DVT, premature atrial contractions, spondylolisthesis of lumbar region, lumbar stenosis, L4-L5 posterior lumbar interbody fusion on 11/20/2020, hx of broken coccyx in high school  PAIN:  Are you having pain? Yes: NPRS scale: 8/10 Pain location: midline low back Pain description: Sharp, achy Aggravating factors: sitting > 15 minutes, standing >2 minutes, stairs Relieving factors: positional change, hydrocodone, massage, heat  PRECAUTIONS: 20# lifting restriction  WEIGHT BEARING RESTRICTIONS: No  FALLS:  Has patient fallen in last 6 months? No  LIVING ENVIRONMENT: Lives with: lives with their spouse Lives in: House/apartment Stairs: Yes: Internal: 18 steps; on right going up, on left going up, and can reach both and External: 5 steps; on right going up, on left going up, and can reach both Has following equipment at home: Single point cane  OCCUPATION: Retired Marine scientist  PLOF: Independent  PATIENT GOALS: ADLs, stairs, exercise, pain relief  NEXT MD VISIT:   OBJECTIVE:   DIAGNOSTIC FINDINGS:  11/20/2020: DG Lumbar Spine 2-3 Views:  IMPRESSION: Three intraoperative fluoroscopic images of the lumbar spine from L4-L5 posterior fusion, as described.  07/29/2018: MR Lumbar Spine WO Contrast:  IMPRESSION: 5 mm anterolisthesis L4-5 with severe facet degeneration. Moderate spinal stenosis and moderate subarticular stenosis bilaterally   Mild degenerative changes throughout the remainder of the lumbar spine as detailed above.  PATIENT SURVEYS:  FOTO 38%, predicted 50% in 14 visits  SCREENING FOR RED FLAGS: Bowel or bladder incontinence: No Cauda equina syndrome: No   COGNITION: Overall cognitive status: Within functional limits for tasks assessed     SENSATION: Not tested  MUSCLE LENGTH: Thomas test: Mild limitation BIL  POSTURE: decreased lumbar lordosis and increased thoracic kyphosis  PALPATION: TTP to BIL lumbar paraspinals/ QL  PASSIVE ACCESSORIES: Hypomobile and painful L2-S4  LUMBAR ROM:   AROM eval  Flexion 50%, p! On ascension  Extension 25%, p!  Right lateral flexion 75%, p!  Left lateral flexion 75%, p!  Right rotation 75%, minor p!  Left rotation 50%, p!   (Blank rows = not tested)   LOWER EXTREMITY MMT:    MMT Right eval Left eval  Hip flexion 3/5 3/5  Hip extension 3/5 3/5  Hip abduction 3+/5 3/5  Knee flexion 5/5 5/5  Knee extension 5/5 5/5  Ankle dorsiflexion 5/5 5/5  Ankle plantarflexion 5/5 5/5   (Blank rows = not tested)   FUNCTIONAL TESTS:  5xSTS: 16 seconds Forearm plank: Unable Squat: WNL  GAIT: Distance walked: 20 ft Assistive device utilized: Single point cane Level of assistance: Modified independence Comments: Slow gait speed, decreased stride length  TODAY'S TREATMENT:                                                                                                                               [facility] Adult PT Treatment:                                                DATE: 04/02/2022 Therapeutic Exercise: Demonstrated and issued Hep with pt education Pt  education regarding potential underlying pathophysiology, POC, prognosis, FOTO Manual Therapy: N/A Neuromuscular re-ed: N/A Therapeutic Activity: N/A Modalities: N/A Self Care: N/A    PATIENT EDUCATION:  Education details: Pt education regarding potential underlying pathophysiology, POC, prognosis, FOTO, and HEP Person educated: Patient Education method: Explanation, Demonstration, and Handouts Education comprehension: verbalized understanding and returned demonstration  HOME EXERCISE PROGRAM: Access Code: [email or web address] URL: [email or web address] Date: 04/02/2022 Prepared by: Vanessa [place]  Exercises - Sidelying Hip Abduction  - 1 x daily - 7 x weekly - 3 sets - 10 reps - Supine Bridge  - 1 x daily - 7 x weekly - 3 sets - 10 reps - Sidelying Open Book Thoracic Lumbar Rotation and Extension  - 1 x daily - 7 x weekly - 2 sets - 10 reps - Supine 90/90 Abdominal Bracing  - 1 x daily - 7 x weekly - 3 sets - 10 reps  ASSESSMENT:  CLINICAL IMPRESSION:  Patient is a 66 y.o. F who was seen today for physical therapy evaluation and treatment for chronic LBP with BIL LE radicular symptoms.  She has a known hx of central canal stenosis, foraminal stenosis, and L4-L5 interbody fusion in 2022. Upon assessment, her primary impairments include painful and limited global trunk AROM, weak BIL global hip MMT, weak functional core strength, painful and hypomobile lumbosacral passive accessory mobility, tight hip flexors, and TTP to BIL lumbar paraspinals and QL. Pt will benefit from skilled PT to address her primary impairments and return to her prior level of function with less limitation.   OBJECTIVE IMPAIRMENTS: Abnormal gait, decreased activity tolerance, decreased balance, decreased endurance, decreased mobility, difficulty walking, decreased ROM, decreased strength, decreased safety awareness, hypomobility, increased edema, impaired flexibility, improper body mechanics, postural  dysfunction, and pain.   ACTIVITY LIMITATIONS: carrying, lifting, bending, sitting, standing, squatting, sleeping, stairs, transfers, and locomotion level  PARTICIPATION LIMITATIONS: meal prep, cleaning, laundry, driving, shopping, community activity, and yard work  PERSONAL FACTORS: Time since onset of injury/illness/exacerbation and 3+ comorbidities: See medical hx  are also affecting patient's functional outcome.   REHAB POTENTIAL: Good  CLINICAL DECISION MAKING: Stable/uncomplicated  EVALUATION COMPLEXITY: Low   GOALS: Goals reviewed with patient? Yes  SHORT TERM GOALS: Target date: 04/30/2022  Pt will report understanding and adherence to initial HEP in order to promote independence in the management of primary impairments. Baseline: HEP provided at eval Goal status: INITIAL    LONG TERM GOALS: Target date: 05/28/2022  Pt will achieve a FOTO score of 50% in order to demonstrate improved functional ability as it relates to the pt's primary impairments. Baseline: 38% Goal status: INITIAL  2.  Pt will achieve 75% mobility in all lumbar AROM planes with 0-5/10 pain in order to get dressed with less limitation. Baseline: See AROM chart, 8-9/10 pain with each movement Goal status: INITIAL  3.  Pt will achieve 4+/5 BIL global hip MMT in order to progress her independent LE strengthening regimen with less limitation. Baseline: See MMT chart Goal status: INITIAL  4.  Pt will reports ability to stand >15 minutes in order to wash dishes with less limitation. Baseline: 2 minutes Goal status: INITIAL  5.  Pt will report ability to sit >30 minutes in order to go on car trips with less limitation. Baseline: 15 minutes Goal status: INITIAL  6.  Pt will achieve a 5xSTS in 13 seconds or less lin order to demonstrate safe community-level transfers with less limitation. Baseline: 16 seconds Goal status: INITIAL  PLAN:  PT FREQUENCY: 1x/week  PT DURATION: 8 weeks  PLANNED  INTERVENTIONS: Therapeutic exercises, Therapeutic activity, Neuromuscular re-education, Balance training, Gait training, Patient/Family education, Self Care, Joint mobilization, Stair training, Vestibular training, Dry Needling, Electrical stimulation, Spinal mobilization, Cryotherapy, Moist heat, Vasopneumatic device, Ultrasound, Contrast bath, Biofeedback, Ionotophoresis '4mg'$ /ml Dexamethasone, Manual therapy, and Re-evaluation.  PLAN FOR NEXT SESSION: Progress early core and hip strengthening and lumbar mobility, Manual techniques including TDN PRN   Vanessa [place], PT, DPT 04/02/22 2:29 PM

## 2022-04-08 ENCOUNTER — Ambulatory Visit: Payer: Medicare Other | Admitting: Physical Therapy

## 2022-04-08 NOTE — Therapy (Deleted)
OUTPATIENT PHYSICAL THERAPY THORACOLUMBAR EVALUATION   Patient Name: Rebecca Marks MRN: [id number] DOB:1955-09-30, 66 y.o., female Today's Date: 04/08/2022     Past Medical History:  Diagnosis Date   Allergy    Anxiety    Arthritis    R wrist and R knee   CAP (community acquired pneumonia) 4/17-22/15   Rochephin & IV Zithromax & 3 days Levaquin   Chronic back pain    DVT (deep venous thrombosis) ([place])    a. in her 20s while on OCPs.   Dyspnea    GERD (gastroesophageal reflux disease)    Hepatitis C    contaminated blood @ her job, prior notes indicate spontaneously cleared   History of tobacco abuse    Osteoarthritis    Palpitations    Premature atrial contractions    a. Holter 11/2013: few PACs.   Pulmonary function study abnormality    a. 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   Raynaud phenomenon    Ruptured lumbar disc    4 herniated disc    Volume overload    a.  history of volume overload during hospital stay for PNA in 08/2013 felt iatrogenic due to receiving a lot of IV fluid. 2D echo 08/2013 showed EF 01-09%, normal diastolic function parameters.   Past Surgical History:  Procedure Laterality Date   CHOLECYSTECTOMY  1985   lumbar stem cell   2020   Patient Active Problem List   Diagnosis Date Noted   Weight loss 03/17/2022   Inflammation of sacroiliac joint ([place]) 11/08/2021   Degeneration of lumbar intervertebral disc 09/04/2021   Spondylolisthesis of lumbar region 11/20/2020   Tobacco dependence 07/23/2020   Cough 04/28/2020   Cat scratch 03/04/2020   Eczema 01/02/2020   Patellofemoral arthritis of right knee 09/09/2019   Viral syndrome 11/23/2018   Degenerative lumbar spinal stenosis 11/16/2018   Well adult exam 04/21/2018   Anxiety 03/03/2018   Anserine bursitis 10/15/2017   Travel advice encounter 08/31/2016   Insomnia 08/26/2016   Postconcussive syndrome 07/24/2016   Somnolence 07/23/2016   Syncope and collapse 07/01/2016    Concussion 07/01/2016   Febrile illness, acute 07/01/2016   Respiratory infection 05/20/2016   Stress at home 02/08/2016   Vitamin D deficiency 09/11/2015   Routine general medical examination at a health care facility 09/11/2015   Allergic rhinitis due to pollen 09/11/2015   Premature atrial contractions    Chronic low back pain 08/21/2014   Nonallopathic lesion of lumbosacral region 08/21/2014   Nonallopathic lesion of thoracic region 08/21/2014   Nonallopathic lesion of sacral region 08/21/2014   Fatigue 09/05/2013   Osteoarthritis 08/02/2013   Raynaud phenomenon 08/02/2013   Hep C w/o coma, chronic ([place]) 05/24/2010    PCP: Cassandria Anger, MD   REFERRING PROVIDER: Cassandria Anger, MD   REFERRING DIAG: M54.42,M54.41,G89.29 (ICD-10-CM) - Chronic bilateral low back pain with bilateral sciatica   Rationale for Evaluation and Treatment: Rehabilitation  THERAPY DIAG:  No diagnosis found.  ONSET DATE: 30 years ago  SUBJECTIVE:  SUBJECTIVE STATEMENT: Pt reports primary c/o central LBP and BIL intermittent LE pain lasting 30 years. She reports her back pain is worse than her leg pain. She denies any N/T related to this problem. The pain travels down BIL LE to the heels. Pt reports she broke her coccyx in high school and has had L4-L5 posterior lumbar interbody fusion on 11/20/2020 to correct a spondylolisthesis. Pt also has been diagnosed with moderate canal and foraminal stenosis. Pt reports recent exacerbation of LBP in March of 2022, leading to her surgery. However, the pain has not improved since her surgery. Current pain is 8/10. Worst pain is 10/10. Best pain is 8/10. Aggravating factors include sitting > 15 minutes, standing >2 minutes, stairs. Easing factors include positional change,  hydrocodone, massage, heat. Pt reports a 20-pound weight loss since her surgery last year, which she attributes to increased pain. Pt denies any unrelenting night pain, nausea/ vomiting, changes in bowel/ bladder function.   PERTINENT HISTORY:  Hx of DVT, premature atrial contractions, spondylolisthesis of lumbar region, lumbar stenosis, L4-L5 posterior lumbar interbody fusion on 11/20/2020, hx of broken coccyx in high school  PAIN:  Are you having pain? Yes: NPRS scale: 8/10 Pain location: midline low back Pain description: Sharp, achy Aggravating factors: sitting > 15 minutes, standing >2 minutes, stairs Relieving factors: positional change, hydrocodone, massage, heat  PRECAUTIONS: 20# lifting restriction  WEIGHT BEARING RESTRICTIONS: No  FALLS:  Has patient fallen in last 6 months? No  LIVING ENVIRONMENT: Lives with: lives with their spouse Lives in: House/apartment Stairs: Yes: Internal: 18 steps; on right going up, on left going up, and can reach both and External: 5 steps; on right going up, on left going up, and can reach both Has following equipment at home: Single point cane  OCCUPATION: Retired Marine scientist  PLOF: Independent  PATIENT GOALS: ADLs, stairs, exercise, pain relief  NEXT MD VISIT:   OBJECTIVE:   DIAGNOSTIC FINDINGS:  11/20/2020: DG Lumbar Spine 2-3 Views: IMPRESSION: Three intraoperative fluoroscopic images of the lumbar spine from L4-L5 posterior fusion, as described.  07/29/2018: MR Lumbar Spine WO Contrast: IMPRESSION: 5 mm anterolisthesis L4-5 with severe facet degeneration. Moderate spinal stenosis and moderate subarticular stenosis bilaterally   Mild degenerative changes throughout the remainder of the lumbar spine as detailed above.  PATIENT SURVEYS:  FOTO 38%, predicted 50% in 14 visits  SCREENING FOR RED FLAGS: Bowel or bladder incontinence: No Cauda equina syndrome: No   COGNITION: Overall cognitive status: Within functional limits for tasks  assessed     SENSATION: Not tested  MUSCLE LENGTH: Thomas test: Mild limitation BIL  POSTURE: decreased lumbar lordosis and increased thoracic kyphosis  PALPATION: TTP to BIL lumbar paraspinals/ QL  PASSIVE ACCESSORIES: Hypomobile and painful L2-S4  LUMBAR ROM:   AROM eval  Flexion 50%, p! On ascension  Extension 25%, p!  Right lateral flexion 75%, p!  Left lateral flexion 75%, p!  Right rotation 75%, minor p!  Left rotation 50%, p!   (Blank rows = not tested)   LOWER EXTREMITY MMT:    MMT Right eval Left eval  Hip flexion 3/5 3/5  Hip extension 3/5 3/5  Hip abduction 3+/5 3/5  Knee flexion 5/5 5/5  Knee extension 5/5 5/5  Ankle dorsiflexion 5/5 5/5  Ankle plantarflexion 5/5 5/5   (Blank rows = not tested)   FUNCTIONAL TESTS:  5xSTS: 16 seconds Forearm plank: Unable Squat: WNL  GAIT: Distance walked: 20 ft Assistive device utilized: Single point cane Level of assistance: Modified  independence Comments: Slow gait speed, decreased stride length  TODAY'S TREATMENT:                                                                                                                               [facility] Adult PT Treatment:                                                DATE: 04/02/2022 Therapeutic Exercise: Demonstrated and issued Hep with pt education Pt education regarding potential underlying pathophysiology, POC, prognosis, FOTO    PATIENT EDUCATION:  Education details: Pt education regarding potential underlying pathophysiology, POC, prognosis, FOTO, and HEP Person educated: Patient Education method: Explanation, Demonstration, and Handouts Education comprehension: verbalized understanding and returned demonstration  HOME EXERCISE PROGRAM: Access Code: [email or web address] URL: [email or web address] Date: 04/02/2022 Prepared by: Vanessa [place]  Exercises - Sidelying Hip Abduction  - 1 x daily - 7 x weekly - 3 sets - 10 reps - Supine Bridge  -  1 x daily - 7 x weekly - 3 sets - 10 reps - Sidelying Open Book Thoracic Lumbar Rotation and Extension  - 1 x daily - 7 x weekly - 2 sets - 10 reps - Supine 90/90 Abdominal Bracing  - 1 x daily - 7 x weekly - 3 sets - 10 reps  ASSESSMENT:  CLINICAL IMPRESSION: Patient is a 66 y.o. F who was seen today for physical therapy evaluation and treatment for chronic LBP with BIL LE radicular symptoms.  She has a known hx of central canal stenosis, foraminal stenosis, and L4-L5 interbody fusion in 2022. Upon assessment, her primary impairments include painful and limited global trunk AROM, weak BIL global hip MMT, weak functional core strength, painful and hypomobile lumbosacral passive accessory mobility, tight hip flexors, and TTP to BIL lumbar paraspinals and QL. Pt will benefit from skilled PT to address her primary impairments and return to her prior level of function with less limitation.   OBJECTIVE IMPAIRMENTS: Abnormal gait, decreased activity tolerance, decreased balance, decreased endurance, decreased mobility, difficulty walking, decreased ROM, decreased strength, decreased safety awareness, hypomobility, increased edema, impaired flexibility, improper body mechanics, postural dysfunction, and pain.   ACTIVITY LIMITATIONS: carrying, lifting, bending, sitting, standing, squatting, sleeping, stairs, transfers, and locomotion level  PARTICIPATION LIMITATIONS: meal prep, cleaning, laundry, driving, shopping, community activity, and yard work  PERSONAL FACTORS: Time since onset of injury/illness/exacerbation and 3+ comorbidities: See medical hx  are also affecting patient's functional outcome.   REHAB POTENTIAL: Good  CLINICAL DECISION MAKING: Stable/uncomplicated  EVALUATION COMPLEXITY: Low   GOALS: Goals reviewed with patient? Yes  SHORT TERM GOALS: Target date: 04/30/2022  Pt will report understanding and adherence to initial HEP in order to promote independence in the management of  primary impairments. Baseline: HEP provided at eval Goal status: INITIAL    LONG  TERM GOALS: Target date: 05/28/2022  Pt will achieve a FOTO score of 50% in order to demonstrate improved functional ability as it relates to the pt's primary impairments. Baseline: 38% Goal status: INITIAL  2.  Pt will achieve 75% mobility in all lumbar AROM planes with 0-5/10 pain in order to get dressed with less limitation. Baseline: See AROM chart, 8-9/10 pain with each movement Goal status: INITIAL  3.  Pt will achieve 4+/5 BIL global hip MMT in order to progress her independent LE strengthening regimen with less limitation. Baseline: See MMT chart Goal status: INITIAL  4.  Pt will reports ability to stand >15 minutes in order to wash dishes with less limitation. Baseline: 2 minutes Goal status: INITIAL  5.  Pt will report ability to sit >30 minutes in order to go on car trips with less limitation. Baseline: 15 minutes Goal status: INITIAL  6.  Pt will achieve a 5xSTS in 13 seconds or less lin order to demonstrate safe community-level transfers with less limitation. Baseline: 16 seconds Goal status: INITIAL  PLAN:  PT FREQUENCY: 1x/week  PT DURATION: 8 weeks  PLANNED INTERVENTIONS: Therapeutic exercises, Therapeutic activity, Neuromuscular re-education, Balance training, Gait training, Patient/Family education, Self Care, Joint mobilization, Stair training, Vestibular training, Dry Needling, Electrical stimulation, Spinal mobilization, Cryotherapy, Moist heat, Vasopneumatic device, Ultrasound, Contrast bath, Biofeedback, Ionotophoresis '4mg'$ /ml Dexamethasone, Manual therapy, and Re-evaluation.  PLAN FOR NEXT SESSION: Progress early core and hip strengthening and lumbar mobility, Manual techniques including TDN PRN   Kevan Ny Makhi Muzquiz PT 04/08/22 9:28 AM

## 2022-04-15 ENCOUNTER — Telehealth: Payer: Self-pay | Admitting: Internal Medicine

## 2022-04-15 NOTE — Telephone Encounter (Signed)
Pt called requesting PCP to assist with billing problem for VISIT 09/04/21 (was diagnostic and a well exam in one visit).  ADDENDUM: to Chart to add procedure code [id number] to visit 09/04/21.  Call pt to confirm [phone]

## 2022-04-16 NOTE — Telephone Encounter (Signed)
What do I need to do?  Thanks

## 2022-04-16 NOTE — Telephone Encounter (Signed)
I will forward to [facility] for her help.Marland KitchenJohny Marks

## 2022-04-17 ENCOUNTER — Telehealth: Payer: Self-pay | Admitting: Internal Medicine

## 2022-04-17 NOTE — Telephone Encounter (Signed)
Patient would like her hydrocodone refilled - please send to [facility] at [facility], [place]  Next appt:  04/22/2022

## 2022-04-18 MED ORDER — HYDROCODONE-ACETAMINOPHEN 10-325 MG PO TABS: 1.0000 | ORAL_TABLET | ORAL | 0 refills | Status: DC | PRN

## 2022-04-18 NOTE — Telephone Encounter (Signed)
Okay. Thank you.

## 2022-04-21 ENCOUNTER — Encounter: Payer: Self-pay | Admitting: Internal Medicine

## 2022-04-21 ENCOUNTER — Ambulatory Visit (INDEPENDENT_AMBULATORY_CARE_PROVIDER_SITE_OTHER): Payer: Medicare Other | Admitting: Internal Medicine

## 2022-04-21 VITALS — BP 122/76 | HR 75 | Temp 97.9°F | Ht 63.5 in | Wt 112.0 lb

## 2022-04-21 DIAGNOSIS — M5442 Lumbago with sciatica, left side: Secondary | ICD-10-CM

## 2022-04-21 DIAGNOSIS — G8929 Other chronic pain: Secondary | ICD-10-CM | POA: Diagnosis not present

## 2022-04-21 DIAGNOSIS — M5441 Lumbago with sciatica, right side: Secondary | ICD-10-CM | POA: Diagnosis not present

## 2022-04-21 DIAGNOSIS — R634 Abnormal weight loss: Secondary | ICD-10-CM

## 2022-04-21 MED ORDER — HYDROCODONE-ACETAMINOPHEN 10-325 MG PO TABS: 1.0000 | ORAL_TABLET | ORAL | 0 refills | Status: DC | PRN

## 2022-04-21 MED ORDER — METHOCARBAMOL 500 MG PO TABS: 500.0000 mg | ORAL_TABLET | Freq: Four times a day (QID) | ORAL | 0 refills | Status: DC | PRN

## 2022-04-21 MED ORDER — OXYCODONE-ACETAMINOPHEN 10-325 MG PO TABS: 1.0000 | ORAL_TABLET | Freq: Four times a day (QID) | ORAL | 0 refills | Status: DC | PRN

## 2022-04-21 NOTE — Progress Notes (Signed)
Subjective:  Patient ID: Rebecca Marks, female    DOB: June 27, 1955  Age: 66 y.o. MRN: [id number]  CC: Follow-up (Pain from mid-back down into both legs with pain medication pain is a 8 on a scale 1-10)   HPI Rebecca Marks presents for severe LBP - pain relief 2 hrs after each Norco pill. Pain is 8-10/10. Rebecca Marks will see Dr Hardin Negus for pain management. Rx - out of Norco at [facility]; needs Oxy while waiting...   Outpatient Medications Prior to Visit  Medication Sig Dispense Refill   albuterol (VENTOLIN HFA) 108 (90 Base) MCG/ACT inhaler Inhale 2 puffs into the lungs every 6 (six) hours as needed for wheezing or shortness of breath. 1 each 3   Ascorbic Acid (VITAMIN C) 1000 MG tablet Take 2,000 mg by mouth 2 (two) times daily as needed (immune system support).     Cholecalciferol (VITAMIN D3) 125 MCG (5000 UT) CAPS Take 10,000 Units by mouth every evening.     CORAL CALCIUM PO Take 1 Dose by mouth. Ionic Calcium (Mixed in bottle of water--sand formulation)     diclofenac (VOLTAREN) 75 MG EC tablet Take 1 tablet (75 mg total) by mouth 2 (two) times daily. 60 tablet 3   doxepin (SINEQUAN) 10 MG capsule TAKE 1 CAPSULE BY MOUTH AT BEDTIME AS NEEDED 90 capsule 1   famotidine (PEPCID) 40 MG tablet Take 1 tablet (40 mg total) by mouth daily. 30 tablet 3   gabapentin (NEURONTIN) 100 MG capsule Take 3 capsules (300 mg total) by mouth at bedtime. 360 capsule 2   naloxone (NARCAN) nasal spray 4 mg/0.1 mL 1 actuation in one nostril once. May repeat in 2-3 min 1 each 2   PHOSPHATIDYLSERINE PO Take 300 mg by mouth at bedtime.     VOLTAREN 1 % GEL Apply 1 application topically in the morning and at bedtime. Applied to both knees and lower back     zaleplon (SONATA) 10 MG capsule Take 1 capsule (10 mg total) by mouth at bedtime as needed for sleep. 90 capsule 1   HYDROcodone-acetaminophen (NORCO) 10-325 MG tablet Take 1 tablet by mouth every 4 (four) hours as needed for severe pain. 120 tablet 0    methocarbamol (ROBAXIN) 500 MG tablet Take 1 tablet (500 mg total) by mouth every 6 (six) hours as needed for muscle spasms. 90 tablet 0   oxyCODONE-acetaminophen (PERCOCET/ROXICET) 5-325 MG tablet Take 1 tablet by mouth every 6 (six) hours as needed for severe pain. 40 tablet 0   No facility-administered medications prior to visit.    ROS: Review of Systems  Constitutional:  Negative for activity change, appetite change, chills, fatigue and unexpected weight change.  HENT:  Negative for congestion, mouth sores and sinus pressure.   Eyes:  Negative for visual disturbance.  Respiratory:  Negative for cough and chest tightness.   Gastrointestinal:  Negative for abdominal pain and nausea.  Genitourinary:  Negative for difficulty urinating, frequency and vaginal pain.  Musculoskeletal:  Positive for back pain and gait problem.  Skin:  Negative for pallor and rash.  Neurological:  Positive for weakness. Negative for dizziness, tremors, numbness and headaches.  Psychiatric/Behavioral:  Negative for confusion, sleep disturbance and suicidal ideas. The patient is not nervous/anxious.     Objective:  BP 122/76 (BP Location: Left Arm, Patient Position: Sitting, Cuff Size: Normal)   Pulse 75   Temp 97.9 F (36.6 C) (Oral)   Ht 5' 3.5" (1.613 m)   Wt 112 lb (50.8 kg)  SpO2 99%   BMI 19.53 kg/m   BP Readings from Last 3 Encounters:  04/21/22 122/76  03/17/22 112/72  01/30/22 112/72    Wt Readings from Last 3 Encounters:  04/21/22 112 lb (50.8 kg)  03/17/22 109 lb (49.4 kg)  01/30/22 114 lb 4.8 oz (51.8 kg)    Physical Exam Constitutional:      General: She is not in acute distress.    Appearance: Normal appearance. She is well-developed.  HENT:     Head: Normocephalic.     Right Ear: External ear normal.     Left Ear: External ear normal.     Nose: Nose normal.  Eyes:     General:        Right eye: No discharge.        Left eye: No discharge.     Conjunctiva/sclera:  Conjunctivae normal.     Pupils: Pupils are equal, round, and reactive to light.  Neck:     Thyroid: No thyromegaly.     Vascular: No JVD.     Trachea: No tracheal deviation.  Cardiovascular:     Rate and Rhythm: Normal rate and regular rhythm.     Heart sounds: Normal heart sounds.  Pulmonary:     Effort: No respiratory distress.     Breath sounds: No stridor. No wheezing.  Abdominal:     General: Bowel sounds are normal. There is no distension.     Palpations: Abdomen is soft. There is no mass.     Tenderness: There is no abdominal tenderness. There is no guarding or rebound.  Musculoskeletal:        General: Tenderness present.     Cervical back: Normal range of motion and neck supple. No rigidity.  Lymphadenopathy:     Cervical: No cervical adenopathy.  Skin:    Findings: No erythema or rash.  Neurological:     Cranial Nerves: No cranial nerve deficit.     Motor: Weakness present. No abnormal muscle tone.     Coordination: Coordination normal.     Gait: Gait abnormal.     Deep Tendon Reflexes: Reflexes normal.  Psychiatric:        Behavior: Behavior normal.        Thought Content: Thought content normal.        Judgment: Judgment normal.   Using a cane Antalgic gait  Lab Results  Component Value Date   WBC 11.6 (H) 08/26/2021   HGB 15.0 08/26/2021   HCT 42.4 08/26/2021   PLT 262.0 08/26/2021   GLUCOSE 105 (H) 08/26/2021   CHOL 164 08/26/2021   TRIG 344.0 (H) 08/26/2021   HDL 38.00 (L) 08/26/2021   LDLDIRECT 92.0 08/26/2021   LDLCALC 90 04/21/2018   ALT 4 08/26/2021   AST 15 08/26/2021   NA 139 08/26/2021   K 3.7 08/26/2021   CL 102 08/26/2021   CREATININE 0.71 08/26/2021   BUN 19 08/26/2021   CO2 27 08/26/2021   TSH 1.86 08/26/2021   INR 1.0 09/12/2015   HGBA1C 4.4 (L) 07/30/2006    DG Lumbar Spine 2-3 Views  Result Date: 11/20/2020 CLINICAL DATA:  Provided history: Elective surgery. Additional history provided: Lumbar 4-5 posterior lumbar interbody  fusion. Provided fluoroscopy time 55 seconds (24.83 mGy). EXAM: LUMBAR SPINE - 2-3 VIEW; DG C-ARM 1-60 MIN COMPARISON:  Lumbar spine MRI 08/28/2020. Lumbar spine radiographs 09/10/2020. FINDINGS: P and lateral view intraoperative fluoroscopic images of the lumbar spine are submitted, 2 images total. The lowest well-formed  intervertebral disc space is designated L5-S1. On the provided images, bilateral pedicle screws are present at L4 and L5. Vertical interconnecting rods were not present at the time the images were taken. Interbody device(s) also present at L4-L5. IMPRESSION: Three intraoperative fluoroscopic images of the lumbar spine from L4-L5 posterior fusion, as described. Electronically Signed   By: Kellie Simmering DO   On: 11/20/2020 10:24   DG C-Arm 1-60 Min  Result Date: 11/20/2020 CLINICAL DATA:  Provided history: Elective surgery. Additional history provided: Lumbar 4-5 posterior lumbar interbody fusion. Provided fluoroscopy time 55 seconds (24.83 mGy). EXAM: LUMBAR SPINE - 2-3 VIEW; DG C-ARM 1-60 MIN COMPARISON:  Lumbar spine MRI 08/28/2020. Lumbar spine radiographs 09/10/2020. FINDINGS: P and lateral view intraoperative fluoroscopic images of the lumbar spine are submitted, 2 images total. The lowest well-formed intervertebral disc space is designated L5-S1. On the provided images, bilateral pedicle screws are present at L4 and L5. Vertical interconnecting rods were not present at the time the images were taken. Interbody device(s) also present at L4-L5. IMPRESSION: Three intraoperative fluoroscopic images of the lumbar spine from L4-L5 posterior fusion, as described. Electronically Signed   By: Kellie Simmering DO   On: 11/20/2020 10:24    Assessment & Plan:   Problem List Items Addressed This Visit     Chronic low back pain    Severe LBP - pain relief 2 hrs after each Norco pill. Pain is 8-10/10. Jnae will see Dr Hardin Negus for pain management. Pt will see Dr Danae Orleans We will increase Norco tabs per  day #150. Robaxin prn Naloxone Rx  [facility]xy [facility]      Relevant Medications   HYDROcodone-acetaminophen (NORCO) 10-325 MG tablet   methocarbamol (ROBAXIN) 500 MG tablet   oxyCODONE-acetaminophen (PERCOCET) 10-325 MG tablet   Weight loss - Primary    Better Wt Readings from Last 3 Encounters:  04/21/22 112 lb (50.8 kg)  03/17/22 109 lb (49.4 kg)  01/30/22 114 lb 4.8 oz (51.8 kg)           Meds ordered this encounter  Medications   HYDROcodone-acetaminophen (NORCO) 10-325 MG tablet    Sig: Take 1 tablet by mouth every 4 (four) hours as needed for severe pain.    Dispense:  150 tablet    Refill:  0    Please fill on or after 04/21/22   methocarbamol (ROBAXIN) 500 MG tablet    Sig: Take 1 tablet (500 mg total) by mouth every 6 (six) hours as needed for muscle spasms.    Dispense:  90 tablet    Refill:  0   oxyCODONE-acetaminophen (PERCOCET) 10-325 MG tablet    Sig: Take 1 tablet by mouth every 6 (six) hours as needed.    Dispense:  20 tablet    Refill:  0    Pharmacy is out of Norco      Follow-up: Return in about 1 week (around 04/28/2022) for a follow-up visit.  Walker Kehr, MD

## 2022-04-21 NOTE — Assessment & Plan Note (Addendum)
Severe LBP - pain relief 2 hrs after each Norco pill. Pain is 8-10/10. Anise will see Dr Hardin Negus for pain management. Pt will see Dr Danae Orleans We will increase Norco tabs per day #150. Robaxin prn Naloxone Rx  [facility]xy [facility]  [facility]

## 2022-04-21 NOTE — Assessment & Plan Note (Signed)
Better Wt Readings from Last 3 Encounters:  04/21/22 112 lb (50.8 kg)  03/17/22 109 lb (49.4 kg)  01/30/22 114 lb 4.8 oz (51.8 kg)

## 2022-04-30 ENCOUNTER — Encounter: Payer: Self-pay | Admitting: Physical Therapy

## 2022-04-30 ENCOUNTER — Encounter: Payer: Self-pay | Admitting: Neurological Surgery

## 2022-04-30 ENCOUNTER — Other Ambulatory Visit: Payer: Self-pay | Admitting: Neurological Surgery

## 2022-04-30 ENCOUNTER — Ambulatory Visit: Payer: Medicare Other | Attending: Internal Medicine | Admitting: Physical Therapy

## 2022-04-30 DIAGNOSIS — M5442 Lumbago with sciatica, left side: Secondary | ICD-10-CM | POA: Diagnosis present

## 2022-04-30 DIAGNOSIS — M461 Sacroiliitis, not elsewhere classified: Secondary | ICD-10-CM

## 2022-04-30 DIAGNOSIS — M6281 Muscle weakness (generalized): Secondary | ICD-10-CM | POA: Diagnosis present

## 2022-04-30 DIAGNOSIS — M5441 Lumbago with sciatica, right side: Secondary | ICD-10-CM | POA: Insufficient documentation

## 2022-04-30 DIAGNOSIS — G8929 Other chronic pain: Secondary | ICD-10-CM | POA: Insufficient documentation

## 2022-04-30 NOTE — Therapy (Signed)
OUTPATIENT PHYSICAL THERAPY TREATMENT NOTE   Patient Name: Rebecca Marks MRN: [id number] DOB:Nov 01, 1955, 66 y.o., female Today's Date: 04/30/2022  PCP: Cassandria Anger, MD     REFERRING PROVIDER: Cassandria Anger, MD   PT End of Session - 04/30/22 1257     Visit Number 2    Number of Visits 9    Date for PT Re-Evaluation 06/04/22    Authorization Type MCR    Authorization Time Period FOTO v6, v10, kx mod v15    Progress Note Due on Visit 10    PT Start Time 1300    PT Stop Time 1341    PT Time Calculation (min) 41 min    Activity Tolerance Patient tolerated treatment well;Patient limited by pain    Behavior During Therapy WFL for tasks assessed/performed             Past Medical History:  Diagnosis Date   Allergy    Anxiety    Arthritis    R wrist and R knee   CAP (community acquired pneumonia) 4/17-22/15   Rochephin & IV Zithromax & 3 days Levaquin   Chronic back pain    DVT (deep venous thrombosis) ([place])    a. in her 20s while on OCPs.   Dyspnea    GERD (gastroesophageal reflux disease)    Hepatitis C    contaminated blood @ her job, prior notes indicate spontaneously cleared   History of tobacco abuse    Osteoarthritis    Palpitations    Premature atrial contractions    a. Holter 11/2013: few PACs.   Pulmonary function study abnormality    a. 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   Raynaud phenomenon    Ruptured lumbar disc    4 herniated disc    Volume overload    a.  history of volume overload during hospital stay for PNA in 08/2013 felt iatrogenic due to receiving a lot of IV fluid. 2D echo 08/2013 showed EF 20-25%, normal diastolic function parameters.   Past Surgical History:  Procedure Laterality Date   CHOLECYSTECTOMY  1985   lumbar stem cell   2020   Patient Active Problem List   Diagnosis Date Noted   Weight loss 03/17/2022   Inflammation of sacroiliac joint ([place]) 11/08/2021   Degeneration of lumbar  intervertebral disc 09/04/2021   Spondylolisthesis of lumbar region 11/20/2020   Tobacco dependence 07/23/2020   Cough 04/28/2020   Cat scratch 03/04/2020   Eczema 01/02/2020   Patellofemoral arthritis of right knee 09/09/2019   Viral syndrome 11/23/2018   Degenerative lumbar spinal stenosis 11/16/2018   Well adult exam 04/21/2018   Anxiety 03/03/2018   Anserine bursitis 10/15/2017   Travel advice encounter 08/31/2016   Insomnia 08/26/2016   Postconcussive syndrome 07/24/2016   Somnolence 07/23/2016   Syncope and collapse 07/01/2016   Concussion 07/01/2016   Febrile illness, acute 07/01/2016   Respiratory infection 05/20/2016   Stress at home 02/08/2016   Vitamin D deficiency 09/11/2015   Routine general medical examination at a health care facility 09/11/2015   Allergic rhinitis due to pollen 09/11/2015   Premature atrial contractions    Chronic low back pain 08/21/2014   Nonallopathic lesion of lumbosacral region 08/21/2014   Nonallopathic lesion of thoracic region 08/21/2014   Nonallopathic lesion of sacral region 08/21/2014   Fatigue 09/05/2013   Osteoarthritis 08/02/2013   Raynaud phenomenon 08/02/2013   Hep C w/o coma, chronic ([place]) 05/24/2010    THERAPY DIAG:  Chronic bilateral  low back pain with bilateral sciatica   Rationale for Evaluation and Treatment Rehabilitation  REFERRING DIAG: Plotnikov, Evie Lacks, MD   PERTINENT HISTORY: Hx of DVT, premature atrial contractions, spondylolisthesis of lumbar region, lumbar stenosis, L4-L5 posterior lumbar interbody fusion on 11/20/2020, hx of broken coccyx in high school   PRECAUTIONS/RESTRICTIONS:   20# lifting restriction   SUBJECTIVE:  Pt reports that she is having pain in both legs R>L  she reports that she has some n/t in her lower legs  PAIN:  Are you having pain? Yes: NPRS scale: 8/10 Pain location: midline low back Pain description: Sharp, achy Aggravating factors: sitting > 15 minutes, standing >2  minutes, stairs Relieving factors: positional change, hydrocodone, massage, heat  OBJECTIVE: (objective measures completed at initial evaluation unless otherwise dated)  DIAGNOSTIC FINDINGS:  11/20/2020: DG Lumbar Spine 2-3 Views: IMPRESSION: Three intraoperative fluoroscopic images of the lumbar spine from L4-L5 posterior fusion, as described.   07/29/2018: MR Lumbar Spine WO Contrast: IMPRESSION: 5 mm anterolisthesis L4-5 with severe facet degeneration. Moderate spinal stenosis and moderate subarticular stenosis bilaterally   Mild degenerative changes throughout the remainder of the lumbar spine as detailed above.   PATIENT SURVEYS:  FOTO 38%, predicted 50% in 14 visits   SCREENING FOR RED FLAGS: Bowel or bladder incontinence: No Cauda equina syndrome: No     COGNITION: Overall cognitive status: Within functional limits for tasks assessed                          SENSATION: Not tested   MUSCLE LENGTH: Thomas test: Mild limitation BIL   POSTURE: decreased lumbar lordosis and increased thoracic kyphosis   PALPATION: TTP to BIL lumbar paraspinals/ QL   PASSIVE ACCESSORIES: Hypomobile and painful L2-S4   LUMBAR ROM:    AROM eval  Flexion 50%, p! On ascension  Extension 25%, p!  Right lateral flexion 75%, p!  Left lateral flexion 75%, p!  Right rotation 75%, minor p!  Left rotation 50%, p!   (Blank rows = not tested)     LOWER EXTREMITY MMT:     MMT Right eval Left eval  Hip flexion 3/5 3/5  Hip extension 3/5 3/5  Hip abduction 3+/5 3/5  Knee flexion 5/5 5/5  Knee extension 5/5 5/5  Ankle dorsiflexion 5/5 5/5  Ankle plantarflexion 5/5 5/5   (Blank rows = not tested)     FUNCTIONAL TESTS:  5xSTS: 16 seconds Forearm plank: Unable Squat: WNL   GAIT: Distance walked: 20 ft Assistive device utilized: Single point cane Level of assistance: Modified independence Comments: Slow gait speed, decreased stride length  HOME EXERCISE PROGRAM: Access Code:  [email or web address] URL: [email or web address] Date: 04/30/2022 Prepared by: Shearon Balo  Exercises - Sidelying Hip Abduction  - 1 x daily - 7 x weekly - 3 sets - 10 reps - Supine Bridge  - 1 x daily - 7 x weekly - 3 sets - 10 reps - Supine 90/90 Abdominal Bracing  - 1 x daily - 7 x weekly - 3 sets - 10 reps - Hooklying Isometric Clamshell  - 1 x daily - 7 x weekly - 3 sets - 10 reps - Supine Hip Adduction Isometric with Ball  - 1 x daily - 7 x weekly - 2 sets - 10 reps - 10'' hold - Standing Bicep Curls with Resistance  - 1 x daily - 7 x weekly - 3 sets - 10 reps  TREATMENT 12/13:  Therapeutic Exercise: - 90-90  leg lifts - S/L hip abd - 2x10 ea - bridge - 3x10 - alternating clam - YTB - 2x10 - hip adduction ball squeeze - 2x10 - 5'' hold - SLR - 3x10 - abdominal contraction using swiss ball - 5'' 2x10 - nu-step L5 12m  ASSESSMENT:   CLINICAL IMPRESSION: Pt significantly limited throughout session by pain.  She is able to complete listed exercises with increase in baseline pain and reports that she wants to complete them despite the increase in pain.  She shows no gross LE weakness despite high pain levels.  Will continue to progress as able.  Reports that she does not want to complete aquatic therapy because she gets very cold when she gets out of the water in the winter.   OBJECTIVE IMPAIRMENTS: Abnormal gait, decreased activity tolerance, decreased balance, decreased endurance, decreased mobility, difficulty walking, decreased ROM, decreased strength, decreased safety awareness, hypomobility, increased edema, impaired flexibility, improper body mechanics, postural dysfunction, and pain.    ACTIVITY LIMITATIONS: carrying, lifting, bending, sitting, standing, squatting, sleeping, stairs, transfers, and locomotion level   PARTICIPATION LIMITATIONS: meal prep, cleaning, laundry, driving, shopping, community activity, and yard work   PERSONAL FACTORS: Time since onset of  injury/illness/exacerbation and 3+ comorbidities: See medical hx  are also affecting patient's functional outcome.    REHAB POTENTIAL: Good   CLINICAL DECISION MAKING: Stable/uncomplicated   EVALUATION COMPLEXITY: Low     GOALS: Goals reviewed with patient? Yes   SHORT TERM GOALS: Target date: 04/30/2022   Pt will report understanding and adherence to initial HEP in order to promote independence in the management of primary impairments. Baseline: HEP provided at eval Goal status: MET       LONG TERM GOALS: Target date: 05/28/2022   Pt will achieve a FOTO score of 50% in order to demonstrate improved functional ability as it relates to the pt's primary impairments. Baseline: 38% Goal status: INITIAL   2.  Pt will achieve 75% mobility in all lumbar AROM planes with 0-5/10 pain in order to get dressed with less limitation. Baseline: See AROM chart, 8-9/10 pain with each movement Goal status: INITIAL   3.  Pt will achieve 4+/5 BIL global hip MMT in order to progress her independent LE strengthening regimen with less limitation. Baseline: See MMT chart Goal status: INITIAL   4.  Pt will reports ability to stand >15 minutes in order to wash dishes with less limitation. Baseline: 2 minutes Goal status: INITIAL   5.  Pt will report ability to sit >30 minutes in order to go on car trips with less limitation. Baseline: 15 minutes Goal status: INITIAL   6.  Pt will achieve a 5xSTS in 13 seconds or less lin order to demonstrate safe community-level transfers with less limitation. Baseline: 16 seconds Goal status: INITIAL   PLAN:   PT FREQUENCY: 1x/week   PT DURATION: 8 weeks   PLANNED INTERVENTIONS: Therapeutic exercises, Therapeutic activity, Neuromuscular re-education, Balance training, Gait training, Patient/Family education, Self Care, Joint mobilization, Stair training, Vestibular training, Dry Needling, Electrical stimulation, Spinal mobilization, Cryotherapy, Moist heat,  Vasopneumatic device, Ultrasound, Contrast bath, Biofeedback, Ionotophoresis 448mml Dexamethasone, Manual therapy, and Re-evaluation.   PLAN FOR NEXT SESSION: Progress early core and hip strengthening and lumbar mobility, Manual techniques including TDN PRN   KaKevan Nyeinhartsen PT 04/30/2022, 1:47 PM

## 2022-05-01 ENCOUNTER — Telehealth: Payer: Self-pay | Admitting: Internal Medicine

## 2022-05-01 NOTE — Telephone Encounter (Signed)
Caller & Relationship to patient:  self   Call back number:[phone]   Date of last office visit:04/21/2022   Date of next office visit:   Medication(s) to be refilled:  Oxycodone 10 mg-325  - Patient states that they still dont have the hydrocodone at the pharmacy        Preferred Pharmacy:

## 2022-05-04 MED ORDER — OXYCODONE-ACETAMINOPHEN 10-325 MG PO TABS: 1.0000 | ORAL_TABLET | Freq: Four times a day (QID) | ORAL | 0 refills | Status: DC | PRN

## 2022-05-04 NOTE — Telephone Encounter (Signed)
Laray Anger prescription emailed.  Thank you

## 2022-05-05 ENCOUNTER — Encounter: Payer: Self-pay | Admitting: Internal Medicine

## 2022-05-05 ENCOUNTER — Telehealth: Payer: Self-pay | Admitting: Internal Medicine

## 2022-05-05 NOTE — Telephone Encounter (Signed)
Left message for patient to call back to schedule Medicare Annual Wellness Visit   No hx of AWV eligible as of 05/19/22  Please schedule at anytime with [facility] Advisor if patient calls the office back.     Any questions, please call me at [phone]

## 2022-05-06 ENCOUNTER — Other Ambulatory Visit: Payer: Self-pay | Admitting: Internal Medicine

## 2022-05-06 MED ORDER — HYDROCODONE-ACETAMINOPHEN 10-325 MG PO TABS: 1.0000 | ORAL_TABLET | ORAL | 0 refills | Status: DC | PRN

## 2022-05-06 NOTE — Progress Notes (Signed)
Rx to [facility]

## 2022-05-07 ENCOUNTER — Ambulatory Visit: Payer: Medicare Other | Admitting: Physical Therapy

## 2022-05-07 ENCOUNTER — Encounter: Payer: Self-pay | Admitting: Physical Therapy

## 2022-05-07 DIAGNOSIS — G8929 Other chronic pain: Secondary | ICD-10-CM

## 2022-05-07 DIAGNOSIS — M6281 Muscle weakness (generalized): Secondary | ICD-10-CM

## 2022-05-07 DIAGNOSIS — M5442 Lumbago with sciatica, left side: Secondary | ICD-10-CM | POA: Diagnosis not present

## 2022-05-07 NOTE — Therapy (Signed)
OUTPATIENT PHYSICAL THERAPY TREATMENT NOTE   Patient Name: Rebecca Marks MRN: [id number] DOB:04-17-1956, 66 y.o., female Today's Date: 05/07/2022  PCP: Cassandria Anger, MD     REFERRING PROVIDER: Cassandria Anger, MD   PT End of Session - 05/07/22 1301     Visit Number 3    Number of Visits 9    Date for PT Re-Evaluation 06/04/22    Authorization Type MCR    Authorization Time Period FOTO v6, v10, kx mod v15    Progress Note Due on Visit 10    PT Start Time 1300    PT Stop Time 1340    PT Time Calculation (min) 40 min    Activity Tolerance Patient tolerated treatment well;Patient limited by pain    Behavior During Therapy WFL for tasks assessed/performed             Past Medical History:  Diagnosis Date   Allergy    Anxiety    Arthritis    R wrist and R knee   CAP (community acquired pneumonia) 4/17-22/15   Rochephin & IV Zithromax & 3 days Levaquin   Chronic back pain    DVT (deep venous thrombosis) ([place])    a. in her 20s while on OCPs.   Dyspnea    GERD (gastroesophageal reflux disease)    Hepatitis C    contaminated blood @ her job, prior notes indicate spontaneously cleared   History of tobacco abuse    Osteoarthritis    Palpitations    Premature atrial contractions    a. Holter 11/2013: few PACs.   Pulmonary function study abnormality    a. 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   Raynaud phenomenon    Ruptured lumbar disc    4 herniated disc    Volume overload    a.  history of volume overload during hospital stay for PNA in 08/2013 felt iatrogenic due to receiving a lot of IV fluid. 2D echo 08/2013 showed EF 40-81%, normal diastolic function parameters.   Past Surgical History:  Procedure Laterality Date   CHOLECYSTECTOMY  1985   lumbar stem cell   2020   Patient Active Problem List   Diagnosis Date Noted   Weight loss 03/17/2022   Inflammation of sacroiliac joint ([place]) 11/08/2021   Degeneration of lumbar  intervertebral disc 09/04/2021   Spondylolisthesis of lumbar region 11/20/2020   Tobacco dependence 07/23/2020   Cough 04/28/2020   Cat scratch 03/04/2020   Eczema 01/02/2020   Patellofemoral arthritis of right knee 09/09/2019   Viral syndrome 11/23/2018   Degenerative lumbar spinal stenosis 11/16/2018   Well adult exam 04/21/2018   Anxiety 03/03/2018   Anserine bursitis 10/15/2017   Travel advice encounter 08/31/2016   Insomnia 08/26/2016   Postconcussive syndrome 07/24/2016   Somnolence 07/23/2016   Syncope and collapse 07/01/2016   Concussion 07/01/2016   Febrile illness, acute 07/01/2016   Respiratory infection 05/20/2016   Stress at home 02/08/2016   Vitamin D deficiency 09/11/2015   Routine general medical examination at a health care facility 09/11/2015   Allergic rhinitis due to pollen 09/11/2015   Premature atrial contractions    Chronic low back pain 08/21/2014   Nonallopathic lesion of lumbosacral region 08/21/2014   Nonallopathic lesion of thoracic region 08/21/2014   Nonallopathic lesion of sacral region 08/21/2014   Fatigue 09/05/2013   Osteoarthritis 08/02/2013   Raynaud phenomenon 08/02/2013   Hep C w/o coma, chronic ([place]) 05/24/2010    THERAPY DIAG:  Chronic bilateral  low back pain with bilateral sciatica  Muscle weakness (generalized)   Rationale for Evaluation and Treatment Rehabilitation  REFERRING DIAG: Plotnikov, Evie Lacks, MD   PERTINENT HISTORY: Hx of DVT, premature atrial contractions, spondylolisthesis of lumbar region, lumbar stenosis, L4-L5 posterior lumbar interbody fusion on 11/20/2020, hx of broken coccyx in high school   PRECAUTIONS/RESTRICTIONS:   20# lifting restriction   SUBJECTIVE:  Pt reports that she was feeling somewhat better this morning  PAIN:  Are you having pain? Yes: NPRS scale: 7/10 Pain location: midline low back Pain description: Sharp, achy Aggravating factors: sitting > 15 minutes, standing >2 minutes,  stairs Relieving factors: positional change, hydrocodone, massage, heat  OBJECTIVE: (objective measures completed at initial evaluation unless otherwise dated)  DIAGNOSTIC FINDINGS:  11/20/2020: DG Lumbar Spine 2-3 Views: IMPRESSION: Three intraoperative fluoroscopic images of the lumbar spine from L4-L5 posterior fusion, as described.   07/29/2018: MR Lumbar Spine WO Contrast: IMPRESSION: 5 mm anterolisthesis L4-5 with severe facet degeneration. Moderate spinal stenosis and moderate subarticular stenosis bilaterally   Mild degenerative changes throughout the remainder of the lumbar spine as detailed above.   PATIENT SURVEYS:  FOTO 38%, predicted 50% in 14 visits   SCREENING FOR RED FLAGS: Bowel or bladder incontinence: No Cauda equina syndrome: No     COGNITION: Overall cognitive status: Within functional limits for tasks assessed                          SENSATION: Not tested   MUSCLE LENGTH: Thomas test: Mild limitation BIL   POSTURE: decreased lumbar lordosis and increased thoracic kyphosis   PALPATION: TTP to BIL lumbar paraspinals/ QL   PASSIVE ACCESSORIES: Hypomobile and painful L2-S4   LUMBAR ROM:    AROM eval  Flexion 50%, p! On ascension  Extension 25%, p!  Right lateral flexion 75%, p!  Left lateral flexion 75%, p!  Right rotation 75%, minor p!  Left rotation 50%, p!   (Blank rows = not tested)     LOWER EXTREMITY MMT:     MMT Right eval Left eval  Hip flexion 3/5 3/5  Hip extension 3/5 3/5  Hip abduction 3+/5 3/5  Knee flexion 5/5 5/5  Knee extension 5/5 5/5  Ankle dorsiflexion 5/5 5/5  Ankle plantarflexion 5/5 5/5   (Blank rows = not tested)     FUNCTIONAL TESTS:  5xSTS: 16 seconds Forearm plank: Unable Squat: WNL   GAIT: Distance walked: 20 ft Assistive device utilized: Single point cane Level of assistance: Modified independence Comments: Slow gait speed, decreased stride length  HOME EXERCISE PROGRAM: Access Code:  [email or web address] URL: [email or web address] Date: 04/30/2022 Prepared by: Shearon Balo  Exercises - Sidelying Hip Abduction  - 1 x daily - 7 x weekly - 3 sets - 10 reps - Supine Bridge  - 1 x daily - 7 x weekly - 3 sets - 10 reps - Supine 90/90 Abdominal Bracing  - 1 x daily - 7 x weekly - 3 sets - 10 reps - Hooklying Isometric Clamshell  - 1 x daily - 7 x weekly - 3 sets - 10 reps - Supine Hip Adduction Isometric with Ball  - 1 x daily - 7 x weekly - 2 sets - 10 reps - 10'' hold - Standing Bicep Curls with Resistance  - 1 x daily - 7 x weekly - 3 sets - 10 reps  TREATMENT 12/20:  Therapeutic Exercise: - 90-90 leg lifts - 1x10 - seated paloff press -  GTB - 2x10 - marching with red TB around knees - 2x10 ea - S/L hip abd - 2x10 ea (NT) - bridge - 3x10 - alternating clam - RTB - 3x10 - hip adduction ball squeeze - 2x10 - 5'' hold - SLR - 1x10 - abdominal contraction using swiss ball - 5'' 2x10   ASSESSMENT:   CLINICAL IMPRESSION: Pt significantly limited throughout session by pain.  She does report therapeutic benefit to therapy last session.  She has increase in pain with almost all exercises performed but she states that this is typical and that her pain levels return to baseline within 24 hours.  We will continue to progress as tolerated.   OBJECTIVE IMPAIRMENTS: Abnormal gait, decreased activity tolerance, decreased balance, decreased endurance, decreased mobility, difficulty walking, decreased ROM, decreased strength, decreased safety awareness, hypomobility, increased edema, impaired flexibility, improper body mechanics, postural dysfunction, and pain.    ACTIVITY LIMITATIONS: carrying, lifting, bending, sitting, standing, squatting, sleeping, stairs, transfers, and locomotion level   PARTICIPATION LIMITATIONS: meal prep, cleaning, laundry, driving, shopping, community activity, and yard work   PERSONAL FACTORS: Time since onset of injury/illness/exacerbation and  3+ comorbidities: See medical hx  are also affecting patient's functional outcome.    REHAB POTENTIAL: Good   CLINICAL DECISION MAKING: Stable/uncomplicated   EVALUATION COMPLEXITY: Low     GOALS: Goals reviewed with patient? Yes   SHORT TERM GOALS: Target date: 04/30/2022   Pt will report understanding and adherence to initial HEP in order to promote independence in the management of primary impairments. Baseline: HEP provided at eval Goal status: MET       LONG TERM GOALS: Target date: 05/28/2022   Pt will achieve a FOTO score of 50% in order to demonstrate improved functional ability as it relates to the pt's primary impairments. Baseline: 38% Goal status: INITIAL   2.  Pt will achieve 75% mobility in all lumbar AROM planes with 0-5/10 pain in order to get dressed with less limitation. Baseline: See AROM chart, 8-9/10 pain with each movement Goal status: INITIAL   3.  Pt will achieve 4+/5 BIL global hip MMT in order to progress her independent LE strengthening regimen with less limitation. Baseline: See MMT chart Goal status: INITIAL   4.  Pt will reports ability to stand >15 minutes in order to wash dishes with less limitation. Baseline: 2 minutes Goal status: INITIAL   5.  Pt will report ability to sit >30 minutes in order to go on car trips with less limitation. Baseline: 15 minutes Goal status: INITIAL   6.  Pt will achieve a 5xSTS in 13 seconds or less lin order to demonstrate safe community-level transfers with less limitation. Baseline: 16 seconds Goal status: INITIAL   PLAN:   PT FREQUENCY: 1x/week   PT DURATION: 8 weeks   PLANNED INTERVENTIONS: Therapeutic exercises, Therapeutic activity, Neuromuscular re-education, Balance training, Gait training, Patient/Family education, Self Care, Joint mobilization, Stair training, Vestibular training, Dry Needling, Electrical stimulation, Spinal mobilization, Cryotherapy, Moist heat, Vasopneumatic device,  Ultrasound, Contrast bath, Biofeedback, Ionotophoresis 22m/ml Dexamethasone, Manual therapy, and Re-evaluation.   PLAN FOR NEXT SESSION: Progress early core and hip strengthening and lumbar mobility, Manual techniques including TDN PRN   KKevan NyReinhartsen PT 05/07/2022, 1:43 PM

## 2022-05-21 ENCOUNTER — Ambulatory Visit: Payer: Medicare Other | Attending: Internal Medicine | Admitting: Physical Therapy

## 2022-05-21 ENCOUNTER — Encounter: Payer: Self-pay | Admitting: Physical Therapy

## 2022-05-21 DIAGNOSIS — M5442 Lumbago with sciatica, left side: Secondary | ICD-10-CM | POA: Insufficient documentation

## 2022-05-21 DIAGNOSIS — M5441 Lumbago with sciatica, right side: Secondary | ICD-10-CM | POA: Diagnosis present

## 2022-05-21 DIAGNOSIS — G8929 Other chronic pain: Secondary | ICD-10-CM | POA: Insufficient documentation

## 2022-05-21 DIAGNOSIS — M6281 Muscle weakness (generalized): Secondary | ICD-10-CM | POA: Diagnosis present

## 2022-05-21 NOTE — Therapy (Signed)
OUTPATIENT PHYSICAL THERAPY TREATMENT NOTE   Patient Name: Rebecca Marks MRN: [id number] DOB:1955/12/09, 67 y.o., female Today's Date: 05/21/2022  PCP: Cassandria Anger, MD     REFERRING PROVIDER: Cassandria Anger, MD   PT End of Session - 05/21/22 1258     Visit Number 4    Number of Visits 9    Date for PT Re-Evaluation 06/04/22    Authorization Type MCR    Authorization Time Period FOTO v6, v10, kx mod v15    Progress Note Due on Visit 10    PT Start Time 1300    PT Stop Time 1340    PT Time Calculation (min) 40 min    Activity Tolerance Patient tolerated treatment well;Patient limited by pain    Behavior During Therapy WFL for tasks assessed/performed             Past Medical History:  Diagnosis Date   Allergy    Anxiety    Arthritis    R wrist and R knee   CAP (community acquired pneumonia) 4/17-22/15   Rochephin & IV Zithromax & 3 days Levaquin   Chronic back pain    DVT (deep venous thrombosis) ([place])    a. in her 20s while on OCPs.   Dyspnea    GERD (gastroesophageal reflux disease)    Hepatitis C    contaminated blood @ her job, prior notes indicate spontaneously cleared   History of tobacco abuse    Osteoarthritis    Palpitations    Premature atrial contractions    a. Holter 11/2013: few PACs.   Pulmonary function study abnormality    a. 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   Raynaud phenomenon    Ruptured lumbar disc    4 herniated disc    Volume overload    a.  history of volume overload during hospital stay for PNA in 08/2013 felt iatrogenic due to receiving a lot of IV fluid. 2D echo 08/2013 showed EF 01-60%, normal diastolic function parameters.   Past Surgical History:  Procedure Laterality Date   CHOLECYSTECTOMY  1985   lumbar stem cell   2020   Patient Active Problem List   Diagnosis Date Noted   Weight loss 03/17/2022   Inflammation of sacroiliac joint ([place]) 11/08/2021   Degeneration of lumbar  intervertebral disc 09/04/2021   Spondylolisthesis of lumbar region 11/20/2020   Tobacco dependence 07/23/2020   Cough 04/28/2020   Cat scratch 03/04/2020   Eczema 01/02/2020   Patellofemoral arthritis of right knee 09/09/2019   Viral syndrome 11/23/2018   Degenerative lumbar spinal stenosis 11/16/2018   Well adult exam 04/21/2018   Anxiety 03/03/2018   Anserine bursitis 10/15/2017   Travel advice encounter 08/31/2016   Insomnia 08/26/2016   Postconcussive syndrome 07/24/2016   Somnolence 07/23/2016   Syncope and collapse 07/01/2016   Concussion 07/01/2016   Febrile illness, acute 07/01/2016   Respiratory infection 05/20/2016   Stress at home 02/08/2016   Vitamin D deficiency 09/11/2015   Routine general medical examination at a health care facility 09/11/2015   Allergic rhinitis due to pollen 09/11/2015   Premature atrial contractions    Chronic low back pain 08/21/2014   Nonallopathic lesion of lumbosacral region 08/21/2014   Nonallopathic lesion of thoracic region 08/21/2014   Nonallopathic lesion of sacral region 08/21/2014   Fatigue 09/05/2013   Osteoarthritis 08/02/2013   Raynaud phenomenon 08/02/2013   Hep C w/o coma, chronic ([place]) 05/24/2010    THERAPY DIAG:  Chronic bilateral  low back pain with bilateral sciatica  Muscle weakness (generalized)   Rationale for Evaluation and Treatment Rehabilitation  REFERRING DIAG: Plotnikov, Evie Lacks, MD   PERTINENT HISTORY: Hx of DVT, premature atrial contractions, spondylolisthesis of lumbar region, lumbar stenosis, L4-L5 posterior lumbar interbody fusion on 11/20/2020, hx of broken coccyx in high school   PRECAUTIONS/RESTRICTIONS:   20# lifting restriction   SUBJECTIVE:   Pt reports that she is having increased pain today.  She started in pain management and will start on a new pain medication tomorrow.  PAIN:  Are you having pain? Yes: NPRS scale: 7/10 Pain location: midline low back Pain description: Sharp,  achy Aggravating factors: sitting > 15 minutes, standing >2 minutes, stairs Relieving factors: positional change, hydrocodone, massage, heat  OBJECTIVE: (objective measures completed at initial evaluation unless otherwise dated)  DIAGNOSTIC FINDINGS:  11/20/2020: DG Lumbar Spine 2-3 Views: IMPRESSION: Three intraoperative fluoroscopic images of the lumbar spine from L4-L5 posterior fusion, as described.   07/29/2018: MR Lumbar Spine WO Contrast: IMPRESSION: 5 mm anterolisthesis L4-5 with severe facet degeneration. Moderate spinal stenosis and moderate subarticular stenosis bilaterally   Mild degenerative changes throughout the remainder of the lumbar spine as detailed above.   PATIENT SURVEYS:  FOTO 38%, predicted 50% in 14 visits   SCREENING FOR RED FLAGS: Bowel or bladder incontinence: No Cauda equina syndrome: No     COGNITION: Overall cognitive status: Within functional limits for tasks assessed                          SENSATION: Not tested   MUSCLE LENGTH: Thomas test: Mild limitation BIL   POSTURE: decreased lumbar lordosis and increased thoracic kyphosis   PALPATION: TTP to BIL lumbar paraspinals/ QL   PASSIVE ACCESSORIES: Hypomobile and painful L2-S4   LUMBAR ROM:    AROM eval  Flexion 50%, p! On ascension  Extension 25%, p!  Right lateral flexion 75%, p!  Left lateral flexion 75%, p!  Right rotation 75%, minor p!  Left rotation 50%, p!   (Blank rows = not tested)     LOWER EXTREMITY MMT:     MMT Right eval Left eval  Hip flexion 3/5 3/5  Hip extension 3/5 3/5  Hip abduction 3+/5 3/5  Knee flexion 5/5 5/5  Knee extension 5/5 5/5  Ankle dorsiflexion 5/5 5/5  Ankle plantarflexion 5/5 5/5   (Blank rows = not tested)     FUNCTIONAL TESTS:  5xSTS: 16 seconds Forearm plank: Unable Squat: WNL   GAIT: Distance walked: 20 ft Assistive device utilized: Single point cane Level of assistance: Modified independence Comments: Slow gait speed,  decreased stride length  HOME EXERCISE PROGRAM: Access Code: [email or web address] URL: [email or web address] Date: 04/30/2022 Prepared by: Shearon Balo  Exercises - Sidelying Hip Abduction  - 1 x daily - 7 x weekly - 3 sets - 10 reps - Supine Bridge  - 1 x daily - 7 x weekly - 3 sets - 10 reps - Supine 90/90 Abdominal Bracing  - 1 x daily - 7 x weekly - 3 sets - 10 reps - Hooklying Isometric Clamshell  - 1 x daily - 7 x weekly - 3 sets - 10 reps - Supine Hip Adduction Isometric with Ball  - 1 x daily - 7 x weekly - 2 sets - 10 reps - 10'' hold - Standing Bicep Curls with Resistance  - 1 x daily - 7 x weekly - 3 sets - 10 reps  TREATMENT  1/3:  Therapeutic Exercise: - 90-90 leg lifts - 1x12 - seated paloff press - GTB - 2x10 - marching with green TB around knees - 3x12 ea - S/L hip abd - 2x10 ea (NT) - bridge - 3x12 - alternating clam - RTB - 3x12 - hip adduction ball squeeze - 2x10 - 5'' hold - SLR - 1x10 (NT) - LAQ - 3x10 ea - abdominal contraction using swiss ball - 5'' 2x10   ASSESSMENT:   CLINICAL IMPRESSION: Pt significantly limited throughout session by pain.  Despite high levels of pain she was able to increase volume of several exercises.  She feels that she has gained some strength since starting PT.   OBJECTIVE IMPAIRMENTS: Abnormal gait, decreased activity tolerance, decreased balance, decreased endurance, decreased mobility, difficulty walking, decreased ROM, decreased strength, decreased safety awareness, hypomobility, increased edema, impaired flexibility, improper body mechanics, postural dysfunction, and pain.    ACTIVITY LIMITATIONS: carrying, lifting, bending, sitting, standing, squatting, sleeping, stairs, transfers, and locomotion level   PARTICIPATION LIMITATIONS: meal prep, cleaning, laundry, driving, shopping, community activity, and yard work   PERSONAL FACTORS: Time since onset of injury/illness/exacerbation and 3+ comorbidities: See medical hx   are also affecting patient's functional outcome.    REHAB POTENTIAL: Good   CLINICAL DECISION MAKING: Stable/uncomplicated   EVALUATION COMPLEXITY: Low     GOALS: Goals reviewed with patient? Yes   SHORT TERM GOALS: Target date: 04/30/2022   Pt will report understanding and adherence to initial HEP in order to promote independence in the management of primary impairments. Baseline: HEP provided at eval Goal status: MET       LONG TERM GOALS: Target date: 05/28/2022   Pt will achieve a FOTO score of 50% in order to demonstrate improved functional ability as it relates to the pt's primary impairments. Baseline: 38% Goal status: INITIAL   2.  Pt will achieve 75% mobility in all lumbar AROM planes with 0-5/10 pain in order to get dressed with less limitation. Baseline: See AROM chart, 8-9/10 pain with each movement Goal status: INITIAL   3.  Pt will achieve 4+/5 BIL global hip MMT in order to progress her independent LE strengthening regimen with less limitation. Baseline: See MMT chart Goal status: INITIAL   4.  Pt will reports ability to stand >15 minutes in order to wash dishes with less limitation. Baseline: 2 minutes Goal status: INITIAL   5.  Pt will report ability to sit >30 minutes in order to go on car trips with less limitation. Baseline: 15 minutes Goal status: INITIAL   6.  Pt will achieve a 5xSTS in 13 seconds or less lin order to demonstrate safe community-level transfers with less limitation. Baseline: 16 seconds Goal status: INITIAL   PLAN:   PT FREQUENCY: 1x/week   PT DURATION: 8 weeks   PLANNED INTERVENTIONS: Therapeutic exercises, Therapeutic activity, Neuromuscular re-education, Balance training, Gait training, Patient/Family education, Self Care, Joint mobilization, Stair training, Vestibular training, Dry Needling, Electrical stimulation, Spinal mobilization, Cryotherapy, Moist heat, Vasopneumatic device, Ultrasound, Contrast bath, Biofeedback,  Ionotophoresis 17m/ml Dexamethasone, Manual therapy, and Re-evaluation.   PLAN FOR NEXT SESSION: Progress early core and hip strengthening and lumbar mobility, Manual techniques including TDN PRN   KKevan NyReinhartsen PT 05/21/2022, 1:50 PM

## 2022-05-26 ENCOUNTER — Ambulatory Visit (INDEPENDENT_AMBULATORY_CARE_PROVIDER_SITE_OTHER): Payer: Medicare Other | Admitting: Internal Medicine

## 2022-05-26 ENCOUNTER — Encounter: Payer: Self-pay | Admitting: Internal Medicine

## 2022-05-26 VITALS — BP 120/70 | HR 75 | Temp 98.3°F | Ht 63.5 in | Wt 112.0 lb

## 2022-05-26 DIAGNOSIS — M4316 Spondylolisthesis, lumbar region: Secondary | ICD-10-CM

## 2022-05-26 DIAGNOSIS — M5442 Lumbago with sciatica, left side: Secondary | ICD-10-CM

## 2022-05-26 DIAGNOSIS — G8929 Other chronic pain: Secondary | ICD-10-CM

## 2022-05-26 DIAGNOSIS — M5441 Lumbago with sciatica, right side: Secondary | ICD-10-CM | POA: Diagnosis not present

## 2022-05-26 DIAGNOSIS — R0789 Other chest pain: Secondary | ICD-10-CM

## 2022-05-26 MED ORDER — ASPIRIN 81 MG PO TBEC: 81.0000 mg | DELAYED_RELEASE_TABLET | Freq: Every day | ORAL | 3 refills | Status: DC

## 2022-05-26 NOTE — Patient Instructions (Signed)

## 2022-05-26 NOTE — Assessment & Plan Note (Addendum)
Pt declined EKG Check coronary calcium CT score Take baby ASA a day for row

## 2022-05-26 NOTE — Assessment & Plan Note (Signed)
Nivedita is seeing Dr Hardin Negus (on MS contin 30 mg bid) now. Pain is 6/10 today

## 2022-05-26 NOTE — Progress Notes (Signed)
Subjective:  Patient ID: Rebecca Marks, female    DOB: 18-Oct-1955  Age: 67 y.o. MRN: [id number]  CC: Follow-up (Had a burning sensation in the middle of her chest for about 20 mins yesterday )   HPI Rebecca Marks presents for chronic pain - seeing Dr Hardin Negus (on MS contin 30 mg bid) now. Pain is 6/10 today. C/o CP yesterday after hanging a bird feeder; No SOB, no sweats etc  Outpatient Medications Prior to Visit  Medication Sig Dispense Refill   albuterol (VENTOLIN HFA) 108 (90 Base) MCG/ACT inhaler Inhale 2 puffs into the lungs every 6 (six) hours as needed for wheezing or shortness of breath. 1 each 3   Ascorbic Acid (VITAMIN C) 1000 MG tablet Take 2,000 mg by mouth 2 (two) times daily as needed (immune system support).     Cholecalciferol (VITAMIN D3) 125 MCG (5000 UT) CAPS Take 10,000 Units by mouth every evening.     CORAL CALCIUM PO Take 1 Dose by mouth. Ionic Calcium (Mixed in bottle of water--sand formulation)     diclofenac (VOLTAREN) 75 MG EC tablet Take 1 tablet (75 mg total) by mouth 2 (two) times daily. 60 tablet 3   doxepin (SINEQUAN) 10 MG capsule TAKE 1 CAPSULE BY MOUTH AT BEDTIME AS NEEDED 90 capsule 1   famotidine (PEPCID) 40 MG tablet Take 1 tablet (40 mg total) by mouth daily. 30 tablet 3   gabapentin (NEURONTIN) 100 MG capsule Take 3 capsules (300 mg total) by mouth at bedtime. 360 capsule 2   magnesium hydroxide (MILK OF MAGNESIA) 400 MG/5ML suspension Take 15 mLs by mouth daily as needed for mild constipation.     methocarbamol (ROBAXIN) 500 MG tablet Take 1 tablet (500 mg total) by mouth every 6 (six) hours as needed for muscle spasms. 90 tablet 0   morphine (MS CONTIN) 30 MG 12 hr tablet SMARTSIG:1 Tablet(s) By Mouth Every 12 Hours     naloxone (NARCAN) nasal spray 4 mg/0.1 mL 1 actuation in one nostril once. May repeat in 2-3 min 1 each 2   PHOSPHATIDYLSERINE PO Take 300 mg by mouth at bedtime.     psyllium (HYDROCIL/METAMUCIL) 95 % PACK Take 1 packet by  mouth daily.     VOLTAREN 1 % GEL Apply 1 application topically in the morning and at bedtime. Applied to both knees and lower back     zaleplon (SONATA) 10 MG capsule Take 1 capsule (10 mg total) by mouth at bedtime as needed for sleep. 90 capsule 1   HYDROcodone-acetaminophen (NORCO) 10-325 MG tablet Take 1 tablet by mouth every 4 (four) hours as needed for severe pain. (Patient not taking: Reported on 05/26/2022) 150 tablet 0   oxyCODONE-acetaminophen (PERCOCET) 10-325 MG tablet Take 1 tablet by mouth every 6 (six) hours as needed for pain. (Patient not taking: Reported on 05/26/2022) 120 tablet 0   No facility-administered medications prior to visit.    ROS: Review of Systems  Constitutional:  Positive for fatigue. Negative for activity change, appetite change, chills and unexpected weight change.  HENT:  Negative for congestion, mouth sores and sinus pressure.   Eyes:  Negative for visual disturbance.  Respiratory:  Negative for cough and chest tightness.   Gastrointestinal:  Negative for abdominal pain and nausea.  Genitourinary:  Negative for difficulty urinating, frequency and vaginal pain.  Musculoskeletal:  Positive for back pain and gait problem.  Skin:  Negative for pallor and rash.  Neurological:  Negative for dizziness, tremors, weakness, numbness and  headaches.  Psychiatric/Behavioral:  Negative for confusion and sleep disturbance.     Objective:  BP 120/70 (BP Location: Right Arm, Patient Position: Sitting, Cuff Size: Normal)   Pulse 75   Temp 98.3 F (36.8 C) (Oral)   Ht 5' 3.5" (1.613 m)   Wt 112 lb (50.8 kg)   SpO2 93%   BMI 19.53 kg/m   BP Readings from Last 3 Encounters:  05/26/22 120/70  04/21/22 122/76  03/17/22 112/72    Wt Readings from Last 3 Encounters:  05/26/22 112 lb (50.8 kg)  04/21/22 112 lb (50.8 kg)  03/17/22 109 lb (49.4 kg)    Physical Exam Constitutional:      General: She is not in acute distress.    Appearance: Normal appearance. She  is well-developed.  HENT:     Head: Normocephalic.     Right Ear: External ear normal.     Left Ear: External ear normal.     Nose: Nose normal.  Eyes:     General:        Right eye: No discharge.        Left eye: No discharge.     Conjunctiva/sclera: Conjunctivae normal.     Pupils: Pupils are equal, round, and reactive to light.  Neck:     Thyroid: No thyromegaly.     Vascular: No JVD.     Trachea: No tracheal deviation.  Cardiovascular:     Rate and Rhythm: Normal rate and regular rhythm.     Heart sounds: Normal heart sounds.  Pulmonary:     Effort: No respiratory distress.     Breath sounds: No stridor. No wheezing.  Abdominal:     General: Bowel sounds are normal. There is no distension.     Palpations: Abdomen is soft. There is no mass.     Tenderness: There is no abdominal tenderness. There is no guarding or rebound.  Musculoskeletal:        General: Tenderness present.     Cervical back: Normal range of motion and neck supple. No rigidity.  Lymphadenopathy:     Cervical: No cervical adenopathy.  Skin:    Findings: No erythema or rash.  Neurological:     Cranial Nerves: No cranial nerve deficit.     Motor: No abnormal muscle tone.     Coordination: Coordination normal.     Deep Tendon Reflexes: Reflexes normal.  Psychiatric:        Behavior: Behavior normal.        Thought Content: Thought content normal.        Judgment: Judgment normal.   LS w/pain Using a cane  Lab Results  Component Value Date   WBC 11.6 (H) 08/26/2021   HGB 15.0 08/26/2021   HCT 42.4 08/26/2021   PLT 262.0 08/26/2021   GLUCOSE 105 (H) 08/26/2021   CHOL 164 08/26/2021   TRIG 344.0 (H) 08/26/2021   HDL 38.00 (L) 08/26/2021   LDLDIRECT 92.0 08/26/2021   LDLCALC 90 04/21/2018   ALT 4 08/26/2021   AST 15 08/26/2021   NA 139 08/26/2021   K 3.7 08/26/2021   CL 102 08/26/2021   CREATININE 0.71 08/26/2021   BUN 19 08/26/2021   CO2 27 08/26/2021   TSH 1.86 08/26/2021   INR 1.0  09/12/2015   HGBA1C 4.4 (L) 07/30/2006    DG Lumbar Spine 2-3 Views  Result Date: 11/20/2020 CLINICAL DATA:  Provided history: Elective surgery. Additional history provided: Lumbar 4-5 posterior lumbar interbody fusion. Provided fluoroscopy  time 55 seconds (24.83 mGy). EXAM: LUMBAR SPINE - 2-3 VIEW; DG C-ARM 1-60 MIN COMPARISON:  Lumbar spine MRI 08/28/2020. Lumbar spine radiographs 09/10/2020. FINDINGS: P and lateral view intraoperative fluoroscopic images of the lumbar spine are submitted, 2 images total. The lowest well-formed intervertebral disc space is designated L5-S1. On the provided images, bilateral pedicle screws are present at L4 and L5. Vertical interconnecting rods were not present at the time the images were taken. Interbody device(s) also present at L4-L5. IMPRESSION: Three intraoperative fluoroscopic images of the lumbar spine from L4-L5 posterior fusion, as described. Electronically Signed   By: Kellie Simmering DO   On: 11/20/2020 10:24   DG C-Arm 1-60 Min  Result Date: 11/20/2020 CLINICAL DATA:  Provided history: Elective surgery. Additional history provided: Lumbar 4-5 posterior lumbar interbody fusion. Provided fluoroscopy time 55 seconds (24.83 mGy). EXAM: LUMBAR SPINE - 2-3 VIEW; DG C-ARM 1-60 MIN COMPARISON:  Lumbar spine MRI 08/28/2020. Lumbar spine radiographs 09/10/2020. FINDINGS: P and lateral view intraoperative fluoroscopic images of the lumbar spine are submitted, 2 images total. The lowest well-formed intervertebral disc space is designated L5-S1. On the provided images, bilateral pedicle screws are present at L4 and L5. Vertical interconnecting rods were not present at the time the images were taken. Interbody device(s) also present at L4-L5. IMPRESSION: Three intraoperative fluoroscopic images of the lumbar spine from L4-L5 posterior fusion, as described. Electronically Signed   By: Kellie Simmering DO   On: 11/20/2020 10:24    Assessment & Plan:   Problem List Items Addressed  This Visit       Musculoskeletal and Integument   Spondylolisthesis of lumbar region - Primary    Rebecca Marks is seeing Dr Hardin Negus (on MS contin 30 mg bid) now. Pain is 6/10 today        Other   Chronic low back pain     Rebecca Marks is seeing Dr Hardin Negus (on MS contin 30 mg bid) now. Pain is 6/10 today      Relevant Medications   morphine (MS CONTIN) 30 MG 12 hr tablet   aspirin EC 81 MG tablet   Chest pain, atypical    Pt declined EKG Check coronary calcium CT score Take baby ASA a day for row      Relevant Orders   CT CARDIAC SCORING (SELF PAY ONLY)      Meds ordered this encounter  Medications   aspirin EC 81 MG tablet    Sig: Take 1 tablet (81 mg total) by mouth daily.    Dispense:  100 tablet    Refill:  3      Follow-up: Return in about 3 months (around 08/25/2022) for a follow-up visit.  Walker Kehr, MD

## 2022-05-26 NOTE — Assessment & Plan Note (Signed)
  Rebecca Marks is seeing Dr Hardin Negus (on MS contin 30 mg bid) now. Pain is 6/10 today

## 2022-05-28 ENCOUNTER — Encounter: Payer: Self-pay | Admitting: Physical Therapy

## 2022-05-28 ENCOUNTER — Ambulatory Visit: Payer: Medicare Other | Admitting: Physical Therapy

## 2022-05-28 DIAGNOSIS — M6281 Muscle weakness (generalized): Secondary | ICD-10-CM

## 2022-05-28 DIAGNOSIS — G8929 Other chronic pain: Secondary | ICD-10-CM

## 2022-05-28 DIAGNOSIS — M5442 Lumbago with sciatica, left side: Secondary | ICD-10-CM | POA: Diagnosis not present

## 2022-05-28 NOTE — Therapy (Signed)
Progress Note Reporting Period 11/15 to 1/03  See note below for Objective Data and Assessment of Progress/Goals.       Patient Name: Rebecca Marks MRN: [id number] DOB:11-11-1955, 67 y.o., female Today's Date: 05/28/2022  PCP: Cassandria Anger, MD     REFERRING PROVIDER: Cassandria Anger, MD   PT End of Session - 05/28/22 1307     Visit Number 5    Number of Visits 9    Date for PT Re-Evaluation 07/23/22    Authorization Type MCR    Authorization Time Period FOTO v6, v10, kx mod v15    Progress Note Due on Visit 10    PT Start Time 1306    PT Stop Time 1344    PT Time Calculation (min) 38 min    Activity Tolerance Patient tolerated treatment well;Patient limited by pain    Behavior During Therapy WFL for tasks assessed/performed             Past Medical History:  Diagnosis Date   Allergy    Anxiety    Arthritis    R wrist and R knee   CAP (community acquired pneumonia) 4/17-22/15   Rochephin & IV Zithromax & 3 days Levaquin   Chronic back pain    DVT (deep venous thrombosis) ([place])    a. in her 20s while on OCPs.   Dyspnea    GERD (gastroesophageal reflux disease)    Hepatitis C    contaminated blood @ her job, prior notes indicate spontaneously cleared   History of tobacco abuse    Osteoarthritis    Palpitations    Premature atrial contractions    a. Holter 11/2013: few PACs.   Pulmonary function study abnormality    a. 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   Raynaud phenomenon    Ruptured lumbar disc    4 herniated disc    Volume overload    a.  history of volume overload during hospital stay for PNA in 08/2013 felt iatrogenic due to receiving a lot of IV fluid. 2D echo 08/2013 showed EF 82-64%, normal diastolic function parameters.   Past Surgical History:  Procedure Laterality Date   CHOLECYSTECTOMY  1985   lumbar stem cell   2020   Patient Active Problem List   Diagnosis Date Noted   Chest pain, atypical 05/26/2022    Weight loss 03/17/2022   Inflammation of sacroiliac joint ([place]) 11/08/2021   Degeneration of lumbar intervertebral disc 09/04/2021   Spondylolisthesis of lumbar region 11/20/2020   Tobacco dependence 07/23/2020   Cough 04/28/2020   Cat scratch 03/04/2020   Eczema 01/02/2020   Patellofemoral arthritis of right knee 09/09/2019   Viral syndrome 11/23/2018   Degenerative lumbar spinal stenosis 11/16/2018   Well adult exam 04/21/2018   Anxiety 03/03/2018   Anserine bursitis 10/15/2017   Travel advice encounter 08/31/2016   Insomnia 08/26/2016   Postconcussive syndrome 07/24/2016   Somnolence 07/23/2016   Syncope and collapse 07/01/2016   Concussion 07/01/2016   Febrile illness, acute 07/01/2016   Respiratory infection 05/20/2016   Stress at home 02/08/2016   Vitamin D deficiency 09/11/2015   Routine general medical examination at a health care facility 09/11/2015   Allergic rhinitis due to pollen 09/11/2015   Premature atrial contractions    Chronic low back pain 08/21/2014   Nonallopathic lesion of lumbosacral region 08/21/2014   Nonallopathic lesion of thoracic region 08/21/2014   Nonallopathic lesion of sacral region 08/21/2014   Fatigue 09/05/2013   Osteoarthritis  08/02/2013   Raynaud phenomenon 08/02/2013   Hep C w/o coma, chronic ([place]) 05/24/2010    THERAPY DIAG:  Chronic bilateral low back pain with bilateral sciatica - Plan: PT plan of care cert/re-cert  Muscle weakness (generalized) - Plan: PT plan of care cert/re-cert   Rationale for Evaluation and Treatment Rehabilitation  REFERRING DIAG: Plotnikov, Evie Lacks, MD   PERTINENT HISTORY: Hx of DVT, premature atrial contractions, spondylolisthesis of lumbar region, lumbar stenosis, L4-L5 posterior lumbar interbody fusion on 11/20/2020, hx of broken coccyx in high school   PRECAUTIONS/RESTRICTIONS:   20# lifting restriction   SUBJECTIVE:   Pt reports that she feels more steady when walking since starting  therapy.  PAIN:  Are you having pain? Yes: NPRS scale: 7/10 Pain location: midline low back Pain description: Sharp, achy Aggravating factors: sitting > 15 minutes, standing >2 minutes, stairs Relieving factors: positional change, hydrocodone, massage, heat  OBJECTIVE: (objective measures completed at initial evaluation unless otherwise dated)  DIAGNOSTIC FINDINGS:  11/20/2020: DG Lumbar Spine 2-3 Views: IMPRESSION: Three intraoperative fluoroscopic images of the lumbar spine from L4-L5 posterior fusion, as described.   07/29/2018: MR Lumbar Spine WO Contrast: IMPRESSION: 5 mm anterolisthesis L4-5 with severe facet degeneration. Moderate spinal stenosis and moderate subarticular stenosis bilaterally   Mild degenerative changes throughout the remainder of the lumbar spine as detailed above.   PATIENT SURVEYS:  FOTO 38%, predicted 50% in 14 visits   SCREENING FOR RED FLAGS: Bowel or bladder incontinence: No Cauda equina syndrome: No     COGNITION: Overall cognitive status: Within functional limits for tasks assessed                          SENSATION: Not tested   MUSCLE LENGTH: Thomas test: Mild limitation BIL   POSTURE: decreased lumbar lordosis and increased thoracic kyphosis   PALPATION: TTP to BIL lumbar paraspinals/ QL   PASSIVE ACCESSORIES: Hypomobile and painful L2-S4   LUMBAR ROM:    AROM eval  Flexion 50%, p! On ascension  Extension 25%, p!  Right lateral flexion 75%, p!  Left lateral flexion 75%, p!  Right rotation 75%, minor p!  Left rotation 50%, p!   (Blank rows = not tested)     LOWER EXTREMITY MMT:     MMT Right eval Left eval  Hip flexion 3/5 3/5  Hip extension 3/5 3/5  Hip abduction 3+/5 3/5  Knee flexion 5/5 5/5  Knee extension 5/5 5/5  Ankle dorsiflexion 5/5 5/5  Ankle plantarflexion 5/5 5/5   (Blank rows = not tested)     FUNCTIONAL TESTS:  5xSTS: 16 seconds Forearm plank: Unable Squat: WNL   GAIT: Distance walked: 20  ft Assistive device utilized: Single point cane Level of assistance: Modified independence Comments: Slow gait speed, decreased stride length  HOME EXERCISE PROGRAM: Access Code: [email or web address] URL: [email or web address] Date: 04/30/2022 Prepared by: Shearon Balo  Exercises - Sidelying Hip Abduction  - 1 x daily - 7 x weekly - 3 sets - 10 reps - Supine Bridge  - 1 x daily - 7 x weekly - 3 sets - 10 reps - Supine 90/90 Abdominal Bracing  - 1 x daily - 7 x weekly - 3 sets - 10 reps - Hooklying Isometric Clamshell  - 1 x daily - 7 x weekly - 3 sets - 10 reps - Supine Hip Adduction Isometric with Ball  - 1 x daily - 7 x weekly - 2 sets - 10 reps -  10'' hold - Standing Bicep Curls with Resistance  - 1 x daily - 7 x weekly - 3 sets - 10 reps  TREATMENT 1/10:  Therapeutic Exercise: - 90-90 leg lifts - 1x15 - seated paloff press - GTB - 2x10 - marching with green TB around knees - 3x15 ea - S/L hip abd - 2x10 ea (NT) - bridge - 3x15 - alternating clam - GTB - 3x15 - hip adduction pilates ring squeeze - 2x10 - 5'' hold - SLR - 1x10 (NT) - LAQ - 3x10 ea - abdominal contraction using swiss ball - 5'' 2x10   ASSESSMENT:   CLINICAL IMPRESSION: [place]ellia tolerated session well with no adverse reaction.  Pt seems to be less restricted by pain today, possibly d/t medication change.  We continued with mat exercises, increasing resistance and volume as able.  Will plan on D/C on schedule with HEP as she is mostly independent with these exercises now.   OBJECTIVE IMPAIRMENTS: Abnormal gait, decreased activity tolerance, decreased balance, decreased endurance, decreased mobility, difficulty walking, decreased ROM, decreased strength, decreased safety awareness, hypomobility, increased edema, impaired flexibility, improper body mechanics, postural dysfunction, and pain.    ACTIVITY LIMITATIONS: carrying, lifting, bending, sitting, standing, squatting, sleeping, stairs, transfers, and  locomotion level   PARTICIPATION LIMITATIONS: meal prep, cleaning, laundry, driving, shopping, community activity, and yard work   PERSONAL FACTORS: Time since onset of injury/illness/exacerbation and 3+ comorbidities: See medical hx  are also affecting patient's functional outcome.    REHAB POTENTIAL: Good   CLINICAL DECISION MAKING: Stable/uncomplicated   EVALUATION COMPLEXITY: Low     GOALS: Goals reviewed with patient? Yes   SHORT TERM GOALS: Target date: 04/30/2022   Pt will report understanding and adherence to initial HEP in order to promote independence in the management of primary impairments. Baseline: HEP provided at eval Goal status: MET       LONG TERM GOALS: Target date: 05/28/2022 extended to 07/23/2022    Pt will achieve a FOTO score of 50% in order to demonstrate improved functional ability as it relates to the pt's primary impairments. Baseline: 38% Goal status: INITIAL   2.  Pt will achieve 75% mobility in all lumbar AROM planes with 0-5/10 pain in order to get dressed with less limitation. Baseline: See AROM chart, 8-9/10 pain with each movement Goal status: INITIAL   3.  Pt will achieve 4+/5 BIL global hip MMT in order to progress her independent LE strengthening regimen with less limitation. Baseline: See MMT chart Goal status: INITIAL   4.  Pt will reports ability to stand >15 minutes in order to wash dishes with less limitation. Baseline: 2 minutes 1/10: MET Goal status: INITIAL   5.  Pt will report ability to sit >30 minutes in order to go on car trips with less limitation. Baseline: 15 minutes 1/10: able to travel for >30 min Goal status: MET   6.  Pt will achieve a 5xSTS in 13 seconds or less lin order to demonstrate safe community-level transfers with less limitation. Baseline: 16 seconds Goal status: INITIAL   PLAN:   PT FREQUENCY: 1x/week   PT DURATION: 8 weeks 07/23/2022    PLANNED INTERVENTIONS: Therapeutic exercises, Therapeutic  activity, Neuromuscular re-education, Balance training, Gait training, Patient/Family education, Self Care, Joint mobilization, Stair training, Vestibular training, Dry Needling, Electrical stimulation, Spinal mobilization, Cryotherapy, Moist heat, Vasopneumatic device, Ultrasound, Contrast bath, Biofeedback, Ionotophoresis '4mg'$ /ml Dexamethasone, Manual therapy, and Re-evaluation.   PLAN FOR NEXT SESSION: Progress early core and hip strengthening and  lumbar mobility, Manual techniques including TDN PRN   Kevan Ny Katrinia Straker PT 05/28/2022, 1:55 PM

## 2022-05-29 ENCOUNTER — Ambulatory Visit
Admission: RE | Admit: 2022-05-29 | Discharge: 2022-05-29 | Disposition: A | Discharge status: Home / Self Care | Payer: Medicare Other | Source: Ambulatory Visit | Attending: Neurological Surgery | Admitting: Neurological Surgery

## 2022-05-29 DIAGNOSIS — M461 Sacroiliitis, not elsewhere classified: Secondary | ICD-10-CM

## 2022-06-04 ENCOUNTER — Ambulatory Visit: Payer: Medicare Other | Admitting: Physical Therapy

## 2022-06-04 ENCOUNTER — Encounter: Payer: Self-pay | Admitting: Physical Therapy

## 2022-06-04 DIAGNOSIS — M5442 Lumbago with sciatica, left side: Secondary | ICD-10-CM | POA: Diagnosis not present

## 2022-06-04 DIAGNOSIS — M6281 Muscle weakness (generalized): Secondary | ICD-10-CM

## 2022-06-04 DIAGNOSIS — G8929 Other chronic pain: Secondary | ICD-10-CM

## 2022-06-04 NOTE — Therapy (Signed)
Treatment    Patient Name: Rebecca Marks MRN: [id number] DOB:[birth year], 67 y.o., female Today's Date: 06/04/2022  PCP: Cassandria Anger, MD     REFERRING PROVIDER: Cassandria Anger, MD   PT End of Session - 06/04/22 1301     Visit Number 6    Number of Visits 9    Date for PT Re-Evaluation 07/23/22    Authorization Type MCR    Authorization Time Period FOTO v6, v10, kx mod v15    Progress Note Due on Visit 10    PT Start Time 1300    PT Stop Time 1338    PT Time Calculation (min) 38 min    Activity Tolerance Patient tolerated treatment well;Patient limited by pain    Behavior During Therapy WFL for tasks assessed/performed             Past Medical History:  Diagnosis Date   Allergy    Anxiety    Arthritis    R wrist and R knee   CAP (community acquired pneumonia) 4/17-22/15   Rochephin & IV Zithromax & 3 days Levaquin   Chronic back pain    DVT (deep venous thrombosis) ([place])    a. in her 20s while on OCPs.   Dyspnea    GERD (gastroesophageal reflux disease)    Hepatitis C    contaminated blood @ her job, prior notes indicate spontaneously cleared   History of tobacco abuse    Osteoarthritis    Palpitations    Premature atrial contractions    a. Holter 11/2013: few PACs.   Pulmonary function study abnormality    a. 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   Raynaud phenomenon    Ruptured lumbar disc    4 herniated disc    Volume overload    a.  history of volume overload during hospital stay for PNA in 08/2013 felt iatrogenic due to receiving a lot of IV fluid. 2D echo 08/2013 showed EF 33-00%, normal diastolic function parameters.   Past Surgical History:  Procedure Laterality Date   CHOLECYSTECTOMY  1985   lumbar stem cell   2020   Patient Active Problem List   Diagnosis Date Noted   Chest pain, atypical 05/26/2022   Weight loss 03/17/2022   Inflammation of sacroiliac joint ([place]) 11/08/2021   Degeneration of lumbar  intervertebral disc 09/04/2021   Spondylolisthesis of lumbar region 11/20/2020   Tobacco dependence 07/23/2020   Cough 04/28/2020   Cat scratch 03/04/2020   Eczema 01/02/2020   Patellofemoral arthritis of right knee 09/09/2019   Viral syndrome 11/23/2018   Degenerative lumbar spinal stenosis 11/16/2018   Well adult exam 04/21/2018   Anxiety 03/03/2018   Anserine bursitis 10/15/2017   Travel advice encounter 08/31/2016   Insomnia 08/26/2016   Postconcussive syndrome 07/24/2016   Somnolence 07/23/2016   Syncope and collapse 07/01/2016   Concussion 07/01/2016   Febrile illness, acute 07/01/2016   Respiratory infection 05/20/2016   Stress at home 02/08/2016   Vitamin D deficiency 09/11/2015   Routine general medical examination at a health care facility 09/11/2015   Allergic rhinitis due to pollen 09/11/2015   Premature atrial contractions    Chronic low back pain 08/21/2014   Nonallopathic lesion of lumbosacral region 08/21/2014   Nonallopathic lesion of thoracic region 08/21/2014   Nonallopathic lesion of sacral region 08/21/2014   Fatigue 09/05/2013   Osteoarthritis 08/02/2013   Raynaud phenomenon 08/02/2013   Hep C w/o coma, chronic ([place]) 05/24/2010    THERAPY DIAG:  Chronic bilateral low back pain with bilateral sciatica  Muscle weakness (generalized)   Rationale for Evaluation and Treatment Rehabilitation  REFERRING DIAG: Plotnikov, Evie Lacks, MD   PERTINENT HISTORY: Hx of DVT, premature atrial contractions, spondylolisthesis of lumbar region, lumbar stenosis, L4-L5 posterior lumbar interbody fusion on 11/20/2020, hx of broken coccyx in high school   PRECAUTIONS/RESTRICTIONS:   20# lifting restriction   SUBJECTIVE:   Pt reports that her low back is slightly less painful today.  She states that she had stumble, but caught herself.  She feels this is progress.  PAIN:  Are you having pain? Yes: NPRS scale: 6/10 Pain location: midline low back Pain description:  Sharp, achy Aggravating factors: sitting > 15 minutes, standing >2 minutes, stairs Relieving factors: positional change, hydrocodone, massage, heat  OBJECTIVE: (objective measures completed at initial evaluation unless otherwise dated)  DIAGNOSTIC FINDINGS:  11/20/2020: DG Lumbar Spine 2-3 Views: IMPRESSION: Three intraoperative fluoroscopic images of the lumbar spine from L4-L5 posterior fusion, as described.   07/29/2018: MR Lumbar Spine WO Contrast: IMPRESSION: 5 mm anterolisthesis L4-5 with severe facet degeneration. Moderate spinal stenosis and moderate subarticular stenosis bilaterally   Mild degenerative changes throughout the remainder of the lumbar spine as detailed above.   PATIENT SURVEYS:  FOTO 38%, predicted 50% in 14 visits   SCREENING FOR RED FLAGS: Bowel or bladder incontinence: No Cauda equina syndrome: No     COGNITION: Overall cognitive status: Within functional limits for tasks assessed                          SENSATION: Not tested   MUSCLE LENGTH: Thomas test: Mild limitation BIL   POSTURE: decreased lumbar lordosis and increased thoracic kyphosis   PALPATION: TTP to BIL lumbar paraspinals/ QL   PASSIVE ACCESSORIES: Hypomobile and painful L2-S4   LUMBAR ROM:    AROM eval  Flexion 50%, p! On ascension  Extension 25%, p!  Right lateral flexion 75%, p!  Left lateral flexion 75%, p!  Right rotation 75%, minor p!  Left rotation 50%, p!   (Blank rows = not tested)     LOWER EXTREMITY MMT:     MMT Right eval Left eval  Hip flexion 3/5 3/5  Hip extension 3/5 3/5  Hip abduction 3+/5 3/5  Knee flexion 5/5 5/5  Knee extension 5/5 5/5  Ankle dorsiflexion 5/5 5/5  Ankle plantarflexion 5/5 5/5   (Blank rows = not tested)     FUNCTIONAL TESTS:  5xSTS: 16 seconds Forearm plank: Unable Squat: WNL   GAIT: Distance walked: 20 ft Assistive device utilized: Single point cane Level of assistance: Modified independence Comments: Slow gait  speed, decreased stride length  HOME EXERCISE PROGRAM: Access Code: [email or web address] URL: [email or web address] Date: 04/30/2022 Prepared by: Shearon Balo  Exercises - Sidelying Hip Abduction  - 1 x daily - 7 x weekly - 3 sets - 10 reps - Supine Bridge  - 1 x daily - 7 x weekly - 3 sets - 10 reps - Supine 90/90 Abdominal Bracing  - 1 x daily - 7 x weekly - 3 sets - 10 reps - Hooklying Isometric Clamshell  - 1 x daily - 7 x weekly - 3 sets - 10 reps - Supine Hip Adduction Isometric with Ball  - 1 x daily - 7 x weekly - 2 sets - 10 reps - 10'' hold - Standing Bicep Curls with Resistance  - 1 x daily - 7 x weekly - 3 sets -  10 reps  TREATMENT 1/10:  Therapeutic Exercise: - 90-90 leg lifts - 20x - seated paloff press - GTB - 2x10 - marching with blue TB around knees - 3x10 ea - S/L hip abd - 2x10 ea (NT) - alternating clam - Blue TB - 3x15 - hip adduction pilates ring squeeze - 3x10 - 5'' hold - SLR - 1x10 (NT) - abdominal contraction using swiss ball - 5'' 3x10   ASSESSMENT:   CLINICAL IMPRESSION: Tawanda tolerated session well with no adverse reaction.  We continue to build volume of mat exercises to good effect.  Pt limited by pain with standing exercises.  Will plan on d/c next visit with HEP.   OBJECTIVE IMPAIRMENTS: Abnormal gait, decreased activity tolerance, decreased balance, decreased endurance, decreased mobility, difficulty walking, decreased ROM, decreased strength, decreased safety awareness, hypomobility, increased edema, impaired flexibility, improper body mechanics, postural dysfunction, and pain.    ACTIVITY LIMITATIONS: carrying, lifting, bending, sitting, standing, squatting, sleeping, stairs, transfers, and locomotion level   PARTICIPATION LIMITATIONS: meal prep, cleaning, laundry, driving, shopping, community activity, and yard work   PERSONAL FACTORS: Time since onset of injury/illness/exacerbation and 3+ comorbidities: See medical hx  are also  affecting patient's functional outcome.    REHAB POTENTIAL: Good   CLINICAL DECISION MAKING: Stable/uncomplicated   EVALUATION COMPLEXITY: Low     GOALS: Goals reviewed with patient? Yes   SHORT TERM GOALS: Target date: 04/30/2022   Pt will report understanding and adherence to initial HEP in order to promote independence in the management of primary impairments. Baseline: HEP provided at eval Goal status: MET       LONG TERM GOALS: Target date: 05/28/2022 extended to 07/23/2022    Pt will achieve a FOTO score of 50% in order to demonstrate improved functional ability as it relates to the pt's primary impairments. Baseline: 38% Goal status: INITIAL   2.  Pt will achieve 75% mobility in all lumbar AROM planes with 0-5/10 pain in order to get dressed with less limitation. Baseline: See AROM chart, 8-9/10 pain with each movement Goal status: INITIAL   3.  Pt will achieve 4+/5 BIL global hip MMT in order to progress her independent LE strengthening regimen with less limitation. Baseline: See MMT chart Goal status: INITIAL   4.  Pt will reports ability to stand >15 minutes in order to wash dishes with less limitation. Baseline: 2 minutes 1/10: MET Goal status: INITIAL   5.  Pt will report ability to sit >30 minutes in order to go on car trips with less limitation. Baseline: 15 minutes 1/10: able to travel for >30 min Goal status: MET   6.  Pt will achieve a 5xSTS in 13 seconds or less lin order to demonstrate safe community-level transfers with less limitation. Baseline: 16 seconds Goal status: INITIAL   PLAN:   PT FREQUENCY: 1x/week   PT DURATION: 8 weeks 07/23/2022    PLANNED INTERVENTIONS: Therapeutic exercises, Therapeutic activity, Neuromuscular re-education, Balance training, Gait training, Patient/Family education, Self Care, Joint mobilization, Stair training, Vestibular training, Dry Needling, Electrical stimulation, Spinal mobilization, Cryotherapy, Moist heat,  Vasopneumatic device, Ultrasound, Contrast bath, Biofeedback, Ionotophoresis '4mg'$ /ml Dexamethasone, Manual therapy, and Re-evaluation.   PLAN FOR NEXT SESSION: Progress early core and hip strengthening and lumbar mobility, Manual techniques including TDN PRN   Kevan Ny Veralyn Lopp PT 06/04/2022, 1:48 PM

## 2022-06-11 ENCOUNTER — Encounter: Payer: Self-pay | Admitting: Physical Therapy

## 2022-06-11 ENCOUNTER — Ambulatory Visit: Payer: Medicare Other | Admitting: Physical Therapy

## 2022-06-11 ENCOUNTER — Other Ambulatory Visit: Payer: Self-pay | Admitting: Internal Medicine

## 2022-06-11 DIAGNOSIS — M6281 Muscle weakness (generalized): Secondary | ICD-10-CM

## 2022-06-11 DIAGNOSIS — G8929 Other chronic pain: Secondary | ICD-10-CM

## 2022-06-11 DIAGNOSIS — M5442 Lumbago with sciatica, left side: Secondary | ICD-10-CM | POA: Diagnosis not present

## 2022-06-11 NOTE — Therapy (Signed)
PHYSICAL THERAPY DISCHARGE SUMMARY  Visits from Start of Care: 7  Current functional level related to goals / functional outcomes: See assessment/goals   Remaining deficits: See assessment/goals   Education / Equipment: HEP and D/C plans  Patient agrees to discharge. Patient goals were partially met. Patient is being discharged due to the patient's request.    Patient Name: Rebecca Marks MRN: [id number] DOB:[birth year], 67 y.o., female Today's Date: 06/11/2022  PCP: Cassandria Anger, MD     REFERRING PROVIDER: Cassandria Anger, MD   PT End of Session - 06/11/22 1302     Visit Number 7    Number of Visits 9    Date for PT Re-Evaluation 07/23/22    Authorization Type MCR    Authorization Time Period FOTO v6, v10, kx mod v15    Progress Note Due on Visit 10    PT Start Time 1300    PT Stop Time 1341    PT Time Calculation (min) 41 min    Activity Tolerance Patient tolerated treatment well;Patient limited by pain    Behavior During Therapy [facility] for tasks assessed/performed             Past Medical History:  Diagnosis Date   Allergy    Anxiety    Arthritis    R wrist and R knee   CAP (community acquired pneumonia) 4/17-22/15   Rochephin & IV Zithromax & 3 days Levaquin   Chronic back pain    DVT (deep venous thrombosis) ([place])    a. in her 20s while on OCPs.   Dyspnea    GERD (gastroesophageal reflux disease)    Hepatitis C    contaminated blood @ her job, prior notes indicate spontaneously cleared   History of tobacco abuse    Osteoarthritis    Palpitations    Premature atrial contractions    a. Holter 11/2013: few PACs.   Pulmonary function study abnormality    a. 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   Raynaud phenomenon    Ruptured lumbar disc    4 herniated disc    Volume overload    a.  history of volume overload during hospital stay for PNA in 08/2013 felt iatrogenic due to receiving a lot of IV fluid. 2D echo 08/2013  showed EF 60-63%, normal diastolic function parameters.   Past Surgical History:  Procedure Laterality Date   CHOLECYSTECTOMY  1985   lumbar stem cell   2020   Patient Active Problem List   Diagnosis Date Noted   Chest pain, atypical 05/26/2022   Weight loss 03/17/2022   Inflammation of sacroiliac joint ([place]) 11/08/2021   Degeneration of lumbar intervertebral disc 09/04/2021   Spondylolisthesis of lumbar region 11/20/2020   Tobacco dependence 07/23/2020   Cough 04/28/2020   Cat scratch 03/04/2020   Eczema 01/02/2020   Patellofemoral arthritis of right knee 09/09/2019   Viral syndrome 11/23/2018   Degenerative lumbar spinal stenosis 11/16/2018   Well adult exam 04/21/2018   Anxiety 03/03/2018   Anserine bursitis 10/15/2017   Travel advice encounter 08/31/2016   Insomnia 08/26/2016   Postconcussive syndrome 07/24/2016   Somnolence 07/23/2016   Syncope and collapse 07/01/2016   Concussion 07/01/2016   Febrile illness, acute 07/01/2016   Respiratory infection 05/20/2016   Stress at home 02/08/2016   Vitamin D deficiency 09/11/2015   Routine general medical examination at a health care facility 09/11/2015   Allergic rhinitis due to pollen 09/11/2015   Premature atrial contractions  Chronic low back pain 08/21/2014   Nonallopathic lesion of lumbosacral region 08/21/2014   Nonallopathic lesion of thoracic region 08/21/2014   Nonallopathic lesion of sacral region 08/21/2014   Fatigue 09/05/2013   Osteoarthritis 08/02/2013   Raynaud phenomenon 08/02/2013   Hep C w/o coma, chronic ([place]) 05/24/2010    THERAPY DIAG:  Chronic bilateral low back pain with bilateral sciatica  Muscle weakness (generalized)   Rationale for Evaluation and Treatment Rehabilitation  REFERRING DIAG: Plotnikov, Evie Lacks, MD   PERTINENT HISTORY: Hx of DVT, premature atrial contractions, spondylolisthesis of lumbar region, lumbar stenosis, L4-L5 posterior lumbar interbody fusion on 11/20/2020, hx of  broken coccyx in high school   PRECAUTIONS/RESTRICTIONS:   20# lifting restriction   SUBJECTIVE:   Pt reports that her pain is high today.  She feels that she is stronger since starting PT, but her pain is about the same.  PAIN:  Are you having pain? Yes: NPRS scale: 6/10 Pain location: midline low back Pain description: Sharp, achy Aggravating factors: sitting > 15 minutes, standing >2 minutes, stairs Relieving factors: positional change, hydrocodone, massage, heat  OBJECTIVE: (objective measures completed at initial evaluation unless otherwise dated)  DIAGNOSTIC FINDINGS:  11/20/2020: DG Lumbar Spine 2-3 Views: IMPRESSION: Three intraoperative fluoroscopic images of the lumbar spine from L4-L5 posterior fusion, as described.   07/29/2018: MR Lumbar Spine WO Contrast: IMPRESSION: 5 mm anterolisthesis L4-5 with severe facet degeneration. Moderate spinal stenosis and moderate subarticular stenosis bilaterally   Mild degenerative changes throughout the remainder of the lumbar spine as detailed above.   PATIENT SURVEYS:  FOTO 38%, predicted 50% in 14 visits   SCREENING FOR RED FLAGS: Bowel or bladder incontinence: No Cauda equina syndrome: No     COGNITION: Overall cognitive status: Within functional limits for tasks assessed                          SENSATION: Not tested   MUSCLE LENGTH: Thomas test: Mild limitation BIL   POSTURE: decreased lumbar lordosis and increased thoracic kyphosis   PALPATION: TTP to BIL lumbar paraspinals/ QL   PASSIVE ACCESSORIES: Hypomobile and painful L2-S4   LUMBAR ROM:    AROM eval  Flexion 50%, p! On ascension  Extension 25%, p!  Right lateral flexion 75%, p!  Left lateral flexion 75%, p!  Right rotation 75%, minor p!  Left rotation 50%, p!   (Blank rows = not tested)     LOWER EXTREMITY MMT:     MMT Right eval Left eval R/L   Hip flexion 3/5 3/5 3+/3+   Hip extension 3/5 3/5 3+/3+   Hip abduction 3+/5 3/5 3+/3+    Knee flexion 5/5 5/5    Knee extension 5/5 5/5    Ankle dorsiflexion 5/5 5/5    Ankle plantarflexion 5/5 5/5     (Blank rows = not tested)     FUNCTIONAL TESTS:  5xSTS: 16 seconds Forearm plank: Unable Squat: WNL   GAIT: Distance walked: 20 ft Assistive device utilized: Single point cane Level of assistance: Modified independence Comments: Slow gait speed, decreased stride length  HOME EXERCISE PROGRAM: Access Code: [email or web address] URL: [email or web address] Date: 04/30/2022 Prepared by: Shearon Balo  Exercises - Sidelying Hip Abduction  - 1 x daily - 7 x weekly - 3 sets - 10 reps - Supine Bridge  - 1 x daily - 7 x weekly - 3 sets - 10 reps - Supine 90/90 Abdominal Bracing  - 1 x daily -  7 x weekly - 3 sets - 10 reps - Hooklying Isometric Clamshell  - 1 x daily - 7 x weekly - 3 sets - 10 reps - Supine Hip Adduction Isometric with Ball  - 1 x daily - 7 x weekly - 2 sets - 10 reps - 10'' hold - Standing Bicep Curls with Resistance  - 1 x daily - 7 x weekly - 3 sets - 10 reps  TREATMENT:  Therapeutic Exercise: - 90-90 leg lifts - 20x - seated paloff press - GTB - 2x10 - marching with blue TB around knees - 3x10 ea - alternating clam - Blue TB - 3x15 - hip adduction pilates ring squeeze - 3x10 - 5'' hold - abdominal contraction using swiss ball - 5'' 3x10  Therapeutic Activity - collecting information for goals, checking progress, and reviewing with patient   ASSESSMENT:   CLINICAL IMPRESSION: Ashawna Geraghty has progressed fair with therapy.  Improved impairments include: hip and core strength and lumbar ROM.  Functional improvements include: improved mobility at home, more stable at home.  Progressions needed include: continued work at home with hep.  Barriers to progress include: Chronic severe pain.  Please see GOALS section for progress on short term and long term goals established at evaluation.  I recommend D/C home with HEP; pt agrees with plan.    OBJECTIVE IMPAIRMENTS: Abnormal gait, decreased activity tolerance, decreased balance, decreased endurance, decreased mobility, difficulty walking, decreased ROM, decreased strength, decreased safety awareness, hypomobility, increased edema, impaired flexibility, improper body mechanics, postural dysfunction, and pain.    ACTIVITY LIMITATIONS: carrying, lifting, bending, sitting, standing, squatting, sleeping, stairs, transfers, and locomotion level   PARTICIPATION LIMITATIONS: meal prep, cleaning, laundry, driving, shopping, community activity, and yard work   PERSONAL FACTORS: Time since onset of injury/illness/exacerbation and 3+ comorbidities: See medical hx  are also affecting patient's functional outcome.    REHAB POTENTIAL: Good   CLINICAL DECISION MAKING: Stable/uncomplicated   EVALUATION COMPLEXITY: Low     GOALS: Goals reviewed with patient? Yes   SHORT TERM GOALS: Target date: 04/30/2022   Pt will report understanding and adherence to initial HEP in order to promote independence in the management of primary impairments. Baseline: HEP provided at eval Goal status: MET       LONG TERM GOALS: Target date: 05/28/2022 extended to 07/23/2022    Pt will achieve a FOTO score of 50% in order to demonstrate improved functional ability as it relates to the pt's primary impairments. Baseline: 38% Goal status: INITIAL   2.  Pt will achieve 75% mobility in all lumbar AROM planes with 0-5/10 pain in order to get dressed with less limitation. Baseline: See AROM chart, 8-9/10 pain with each movement 1/24: not met, 8/10 pain Goal status: not met   3.  Pt will achieve 4+/5 BIL global hip MMT in order to progress her independent LE strengthening regimen with less limitation. Baseline: See MMT chart 1/24: see chart Goal status: not met   4.  Pt will reports ability to stand >15 minutes in order to wash dishes with less limitation. Baseline: 2 minutes 1/10: MET Goal status: MET    5.  Pt will report ability to sit >30 minutes in order to go on car trips with less limitation. Baseline: 15 minutes 1/10: able to travel for >30 min Goal status: MET   6.  Pt will achieve a 5xSTS in 13 seconds or less lin order to demonstrate safe community-level transfers with less limitation. Baseline: 16 seconds 1/24:  30 Goal status: not met   PLAN:   PT FREQUENCY: 1x/week   PT DURATION: 8 weeks 07/23/2022    PLANNED INTERVENTIONS: Therapeutic exercises, Therapeutic activity, Neuromuscular re-education, Balance training, Gait training, Patient/Family education, Self Care, Joint mobilization, Stair training, Vestibular training, Dry Needling, Electrical stimulation, Spinal mobilization, Cryotherapy, Moist heat, Vasopneumatic device, Ultrasound, Contrast bath, Biofeedback, Ionotophoresis '4mg'$ /ml Dexamethasone, Manual therapy, and Re-evaluation.   PLAN FOR NEXT SESSION: Progress early core and hip strengthening and lumbar mobility, Manual techniques including TDN PRN   Kevan Ny Becca Bayne PT 06/11/2022, 1:46 PM

## 2022-06-12 ENCOUNTER — Other Ambulatory Visit: Payer: Self-pay | Admitting: Internal Medicine

## 2022-07-02 ENCOUNTER — Other Ambulatory Visit: Payer: Self-pay | Admitting: Neurological Surgery

## 2022-07-02 DIAGNOSIS — M4316 Spondylolisthesis, lumbar region: Secondary | ICD-10-CM

## 2022-07-12 ENCOUNTER — Other Ambulatory Visit: Payer: Self-pay | Admitting: Internal Medicine

## 2022-07-25 ENCOUNTER — Ambulatory Visit
Admission: RE | Admit: 2022-07-25 | Discharge: 2022-07-25 | Disposition: A | Discharge status: Home / Self Care | Payer: Medicare Other | Source: Ambulatory Visit | Attending: Neurological Surgery | Admitting: Neurological Surgery

## 2022-07-25 DIAGNOSIS — M4316 Spondylolisthesis, lumbar region: Secondary | ICD-10-CM

## 2022-08-04 ENCOUNTER — Ambulatory Visit
Admission: RE | Admit: 2022-08-04 | Discharge: 2022-08-04 | Disposition: A | Discharge status: Home / Self Care | Payer: Medicare Other | Source: Ambulatory Visit | Attending: Neurological Surgery | Admitting: Neurological Surgery

## 2022-08-04 DIAGNOSIS — M4316 Spondylolisthesis, lumbar region: Secondary | ICD-10-CM

## 2022-08-11 ENCOUNTER — Telehealth: Payer: Self-pay | Admitting: Internal Medicine

## 2022-08-11 NOTE — Telephone Encounter (Signed)
Prescription Request  08/11/2022  LOV: 05/26/2022  What is the name of the medication or equipment? zaleplon (SONATA) 10 MG capsule  methocarbamol (ROBAXIN) 500 MG tablet   Have you contacted your pharmacy to request a refill? No   Which pharmacy would you like this sent to?  [id number] [address], [place] [address] Phone: [phone] Fax: [phone]   Patient notified that their request is being sent to the clinical staff for review and that they should receive a response within 2 business days.   Please advise at Mobile [phone] (mobile)   Next OV is 08/25/22.

## 2022-08-12 MED ORDER — ZALEPLON 10 MG PO CAPS: 10.0000 mg | ORAL_CAPSULE | Freq: Every evening | ORAL | 1 refills | Status: DC | PRN

## 2022-08-12 NOTE — Telephone Encounter (Signed)
Okay.  Thanks.

## 2022-08-13 ENCOUNTER — Other Ambulatory Visit: Payer: Medicare Other

## 2022-08-20 ENCOUNTER — Telehealth: Payer: Self-pay | Admitting: *Deleted

## 2022-08-20 NOTE — Telephone Encounter (Signed)
Rec'd fax stating we have supplied your patient w/ a temporary supply of Zaleplon 10 mg on 08/13/22. This medication require a formulary exception form (PA), or the MD can change to alternative Zolpidem.Marland KitchenJohny Chess

## 2022-08-20 NOTE — Telephone Encounter (Signed)
OK PA Thanks

## 2022-08-21 NOTE — Telephone Encounter (Signed)
Submitted PA w/ (Key: [place]). Waiting on determination.Marland KitchenJohny Chess

## 2022-08-25 ENCOUNTER — Encounter: Payer: Self-pay | Admitting: Internal Medicine

## 2022-08-25 ENCOUNTER — Ambulatory Visit (INDEPENDENT_AMBULATORY_CARE_PROVIDER_SITE_OTHER): Payer: Medicare Other | Admitting: Internal Medicine

## 2022-08-25 VITALS — BP 122/74 | HR 71 | Temp 98.6°F | Ht 63.0 in | Wt 112.0 lb

## 2022-08-25 DIAGNOSIS — Z0000 Encounter for general adult medical examination without abnormal findings: Secondary | ICD-10-CM

## 2022-08-25 DIAGNOSIS — E559 Vitamin D deficiency, unspecified: Secondary | ICD-10-CM | POA: Diagnosis not present

## 2022-08-25 MED ORDER — METHOCARBAMOL 500 MG PO TABS: 500.0000 mg | ORAL_TABLET | Freq: Four times a day (QID) | ORAL | 0 refills | Status: DC | PRN

## 2022-08-25 MED ORDER — NIRMATRELVIR/RITONAVIR (PAXLOVID)TABLET: 3.0000 | ORAL_TABLET | Freq: Two times a day (BID) | ORAL | 0 refills | Status: AC

## 2022-08-25 MED ORDER — CEFUROXIME AXETIL 500 MG PO TABS: 500.0000 mg | ORAL_TABLET | Freq: Two times a day (BID) | ORAL | 0 refills | Status: AC

## 2022-08-25 MED ORDER — FLUCONAZOLE 150 MG PO TABS: 150.0000 mg | ORAL_TABLET | Freq: Once | ORAL | 1 refills | Status: AC

## 2022-08-25 MED ORDER — ONDANSETRON HCL 4 MG PO TABS: 4.0000 mg | ORAL_TABLET | Freq: Three times a day (TID) | ORAL | 0 refills | Status: DC | PRN

## 2022-08-25 MED ORDER — ZOLPIDEM TARTRATE 10 MG PO TABS: 10.0000 mg | ORAL_TABLET | Freq: Every evening | ORAL | 2 refills | Status: DC | PRN

## 2022-08-25 NOTE — Progress Notes (Signed)
Subjective:  Patient ID: Rebecca Marks, female    DOB: [birth year]  Age: 67 y.o. MRN: [id number]  CC: No chief complaint on file.   HPI Rebecca Marks presents for insomnia Travel advice Trying to   Outpatient Medications Prior to Visit  Medication Sig Dispense Refill   Ascorbic Acid (VITAMIN C) 1000 MG tablet Take 2,000 mg by mouth 2 (two) times daily as needed (immune system support).     Cholecalciferol (VITAMIN D3) 125 MCG (5000 UT) CAPS Take 10,000 Units by mouth every evening.     CORAL CALCIUM PO Take 1 Dose by mouth. Ionic Calcium (Mixed in bottle of water--sand formulation)     diclofenac (VOLTAREN) 75 MG EC tablet TAKE 1 TABLET BY MOUTH TWICE A DAY 60 tablet 3   doxepin (SINEQUAN) 10 MG capsule TAKE 1 CAPSULE BY MOUTH EVERY DAY AT BEDTIME AS NEEDED 90 capsule 1   famotidine (PEPCID) 40 MG tablet TAKE 1 TABLET BY MOUTH EVERY DAY 90 tablet 3   magnesium hydroxide (MILK OF MAGNESIA) 400 MG/5ML suspension Take 15 mLs by mouth daily as needed for mild constipation.     naloxone (NARCAN) nasal spray 4 mg/0.1 mL 1 actuation in one nostril once. May repeat in 2-3 min 1 each 2   oxyCODONE-acetaminophen (PERCOCET) 10-325 MG tablet Take 1 tablet by mouth in the morning and at bedtime.     PHOSPHATIDYLSERINE PO Take 300 mg by mouth at bedtime.     psyllium (HYDROCIL/METAMUCIL) 95 % PACK Take 1 packet by mouth daily.     VOLTAREN 1 % GEL Apply 1 application topically in the morning and at bedtime. Applied to both knees and lower back     XTAMPZA ER 18 MG C12A Take 18 mg by mouth in the morning and at bedtime.     zaleplon (SONATA) 10 MG capsule Take 1 capsule (10 mg total) by mouth at bedtime as needed for sleep. 90 capsule 1   methocarbamol (ROBAXIN) 500 MG tablet Take 1 tablet (500 mg total) by mouth every 6 (six) hours as needed for muscle spasms. 90 tablet 0   albuterol (VENTOLIN HFA) 108 (90 Base) MCG/ACT inhaler Inhale 2 puffs into the lungs every 6 (six) hours as needed for  wheezing or shortness of breath. (Patient not taking: Reported on 08/25/2022) 1 each 3   aspirin EC 81 MG tablet Take 1 tablet (81 mg total) by mouth daily. (Patient not taking: Reported on 08/25/2022) 100 tablet 3   gabapentin (NEURONTIN) 100 MG capsule Take 3 capsules (300 mg total) by mouth at bedtime. (Patient not taking: Reported on 08/25/2022) 360 capsule 2   morphine (MS CONTIN) 30 MG 12 hr tablet SMARTSIG:1 Tablet(s) By Mouth Every 12 Hours (Patient not taking: Reported on 08/25/2022)     No facility-administered medications prior to visit.    ROS: Review of Systems  Constitutional:  Negative for activity change, appetite change, chills, fatigue and unexpected weight change.  HENT:  Negative for congestion, mouth sores and sinus pressure.   Eyes:  Negative for visual disturbance.  Respiratory:  Negative for cough and chest tightness.   Gastrointestinal:  Negative for abdominal pain and nausea.  Genitourinary:  Negative for difficulty urinating, frequency and vaginal pain.  Musculoskeletal:  Positive for arthralgias, back pain and gait problem.  Skin:  Negative for pallor and rash.  Neurological:  Negative for dizziness, tremors, weakness, numbness and headaches.  Psychiatric/Behavioral:  Negative for confusion and sleep disturbance.     Objective:  BP  122/74 (BP Location: Left Arm, Patient Position: Sitting, Cuff Size: Normal)   Pulse 71   Temp 98.6 F (37 C) (Oral)   Ht 5\' 3"  (1.6 m)   Wt 112 lb (50.8 kg)   SpO2 95%   BMI 19.84 kg/m   BP Readings from Last 3 Encounters:  08/25/22 122/74  05/26/22 120/70  04/21/22 122/76    Wt Readings from Last 3 Encounters:  08/25/22 112 lb (50.8 kg)  05/26/22 112 lb (50.8 kg)  04/21/22 112 lb (50.8 kg)    Physical Exam Constitutional:      General: She is not in acute distress.    Appearance: She is well-developed.  HENT:     Head: Normocephalic.     Right Ear: External ear normal.     Left Ear: External ear normal.     Nose:  Nose normal.  Eyes:     General:        Right eye: No discharge.        Left eye: No discharge.     Conjunctiva/sclera: Conjunctivae normal.     Pupils: Pupils are equal, round, and reactive to light.  Neck:     Thyroid: No thyromegaly.     Vascular: No JVD.     Trachea: No tracheal deviation.  Cardiovascular:     Rate and Rhythm: Normal rate and regular rhythm.     Heart sounds: Normal heart sounds.  Pulmonary:     Effort: No respiratory distress.     Breath sounds: No stridor. No wheezing.  Abdominal:     General: Bowel sounds are normal. There is no distension.     Palpations: Abdomen is soft. There is no mass.     Tenderness: There is no abdominal tenderness. There is no guarding or rebound.  Musculoskeletal:        General: No tenderness.     Cervical back: Normal range of motion and neck supple. No rigidity.  Lymphadenopathy:     Cervical: No cervical adenopathy.  Skin:    Findings: No erythema or rash.  Neurological:     Cranial Nerves: No cranial nerve deficit.     Motor: No abnormal muscle tone.     Coordination: Coordination normal.     Deep Tendon Reflexes: Reflexes normal.  Psychiatric:        Behavior: Behavior normal.        Thought Content: Thought content normal.        Judgment: Judgment normal.     Lab Results  Component Value Date   WBC 11.6 (H) 08/26/2021   HGB 15.0 08/26/2021   HCT 42.4 08/26/2021   PLT 262.0 08/26/2021   GLUCOSE 105 (H) 08/26/2021   CHOL 164 08/26/2021   TRIG 344.0 (H) 08/26/2021   HDL 38.00 (L) 08/26/2021   LDLDIRECT 92.0 08/26/2021   LDLCALC 90 04/21/2018   ALT 4 08/26/2021   AST 15 08/26/2021   NA 139 08/26/2021   K 3.7 08/26/2021   CL 102 08/26/2021   CREATININE 0.71 08/26/2021   BUN 19 08/26/2021   CO2 27 08/26/2021   TSH 1.86 08/26/2021   INR 1.0 09/12/2015   HGBA1C 4.4 (L) 07/30/2006    MR LUMBAR SPINE WO CONTRAST  Result Date: 08/07/2022 CLINICAL DATA:  Low back pain radiating into the coccyx with  intermittent bilateral leg numbness for 2 years. Lumbar fusion 11/20/2020. EXAM: MRI LUMBAR SPINE WITHOUT CONTRAST TECHNIQUE: Multiplanar, multisequence MR imaging of the lumbar spine was performed. No intravenous contrast  was administered. COMPARISON:  Lumbar spine CT 07/25/2022.  Lumbar MRI 07/29/2018. FINDINGS: Segmentation: Conventional anatomy assumed, with the last open disc space designated L5-S1.Concordant with previous imaging. Alignment:  Stable grade 1 fixed anterolisthesis at L4-5. Vertebrae: No worrisome osseous lesion, acute fracture or pars defect. Postsurgical changes at L4-5 related to PLIF. No endplate edema. Mild sacroiliac degenerative changes bilaterally. Conus medullaris: Extends to the T12-L1 level and appears normal. Paraspinal and other soft tissues: No significant paraspinal findings. Grossly stable complex cystic lesions in the left kidney with associated fluid-fluid level; no follow-up imaging recommended. Disc levels: No significant disc space findings from T11-12 through L2-3. L3-4: As seen on recent CT, mild adjacent segment changes. Disc height is preserved with circumferential disc bulging, moderate facet and ligamentous hypertrophy. There are small bilateral facet joint effusions. Stable mild spinal stenosis and mild lateral recess narrowing bilaterally. The foramina are sufficiently patent. L4-5: The spinal canal and neural foramina appear widely patent status post laminectomy and PLIF. L5-S1: Stable mild disc bulging with mild-to-moderate facet hypertrophy. Stable mild narrowing of the left lateral recess. The foramina are sufficiently patent. IMPRESSION: 1. No acute findings or explanation for the patient's symptoms. 2. Stable postsurgical changes at L4-5 status post laminectomy and PLIF. 3. Stable mild adjacent segment changes at L3-4 with mild spinal stenosis and mild lateral recess narrowing bilaterally. 4. Stable mild left lateral recess narrowing at L5-S1. Electronically  Signed   By: Carey Bullocks M.D.   On: 08/07/2022 15:50    Assessment & Plan:   Problem List Items Addressed This Visit       Other   Vitamin D deficiency    On Vit D - look at Vit D3+K2      Well adult exam - Primary    We discussed age appropriate health related issues, including available/recomended screening tests and vaccinations. We discussed a need for adhering to healthy diet and exercise. Labs were ordered to be later reviewed . All questions were answered. Sadeem is planning to stop smoking (currently smoking 1/2 PPD) 4/23  cardiac CT scan for calcium scoring offered 2019         Meds ordered this encounter  Medications   nirmatrelvir/ritonavir (PAXLOVID) 20 x 150 MG & 10 x 100MG  TABS    Sig: Take 3 tablets by mouth 2 (two) times daily for 5 days. (Take nirmatrelvir 150 mg two tablets twice daily for 5 days and ritonavir 100 mg one tablet twice daily for 5 days) Patient GFR is 89    Dispense:  30 tablet    Refill:  0   cefUROXime (CEFTIN) 500 MG tablet    Sig: Take 1 tablet (500 mg total) by mouth 2 (two) times daily with a meal for 10 days.    Dispense:  20 tablet    Refill:  0   fluconazole (DIFLUCAN) 150 MG tablet    Sig: Take 1 tablet (150 mg total) by mouth once for 1 dose.    Dispense:  1 tablet    Refill:  1   ondansetron (ZOFRAN) 4 MG tablet    Sig: Take 1 tablet (4 mg total) by mouth every 8 (eight) hours as needed for nausea or vomiting.    Dispense:  20 tablet    Refill:  0   zolpidem (AMBIEN) 10 MG tablet    Sig: Take 1 tablet (10 mg total) by mouth at bedtime as needed for sleep.    Dispense:  30 tablet    Refill:  2   methocarbamol (ROBAXIN) 500 MG tablet    Sig: Take 1 tablet (500 mg total) by mouth every 6 (six) hours as needed for muscle spasms.    Dispense:  90 tablet    Refill:  0      Follow-up: Return in about 4 months (around 12/25/2022) for a follow-up visit.  Sonda PrimesAlex Kinzy Weyers, MD

## 2022-08-25 NOTE — Assessment & Plan Note (Signed)
We discussed age appropriate health related issues, including available/recomended screening tests and vaccinations. We discussed a need for adhering to healthy diet and exercise. Labs were ordered to be later reviewed . All questions were answered. Rebecca Marks is planning to stop smoking (currently smoking 1/2 PPD) 4/23  cardiac CT scan for calcium scoring offered 2019

## 2022-08-25 NOTE — Assessment & Plan Note (Signed)
On Vit D - look at Vit D3+K2

## 2022-08-25 NOTE — Patient Instructions (Signed)
look at Vit D3+K2

## 2022-09-16 ENCOUNTER — Telehealth: Payer: Self-pay | Admitting: Internal Medicine

## 2022-09-16 NOTE — Telephone Encounter (Signed)
Pt states her and her husband got covid while she was in the [place]. They had a share rx for Ceftin that MD rx her to take with her. She is wanting to know can she get another rx for Ceftin that way she can finish up taking the script.Marland KitchenRaechel Chute

## 2022-09-16 NOTE — Telephone Encounter (Signed)
Patient would like a call back to discuss her Covid medication. She has been taking it and is still not feeling well. Please call back at [phone].

## 2022-09-17 ENCOUNTER — Other Ambulatory Visit: Payer: Medicare Other

## 2022-09-17 MED ORDER — CEFUROXIME AXETIL 250 MG PO TABS
250.0000 mg | ORAL_TABLET | Freq: Two times a day (BID) | ORAL | 0 refills | Status: DC
Start: 2022-09-17 — End: 2022-12-03

## 2022-09-17 NOTE — Telephone Encounter (Signed)
Notified pt w/MD response.../lmb 

## 2022-09-17 NOTE — Telephone Encounter (Signed)
Okay.  Thanks.

## 2022-09-22 ENCOUNTER — Other Ambulatory Visit: Payer: Medicare Other

## 2022-09-29 ENCOUNTER — Telehealth: Payer: Self-pay | Admitting: Internal Medicine

## 2022-09-29 ENCOUNTER — Ambulatory Visit
Admission: RE | Admit: 2022-09-29 | Discharge: 2022-09-29 | Disposition: A | Discharge status: Home / Self Care | Payer: No Typology Code available for payment source | Source: Ambulatory Visit | Attending: Internal Medicine | Admitting: Internal Medicine

## 2022-09-29 DIAGNOSIS — R0789 Other chest pain: Secondary | ICD-10-CM

## 2022-09-29 NOTE — Telephone Encounter (Signed)
Prescription Request  09/29/2022  LOV: 08/25/2022  What is the name of the medication or equipment? zaleplon (SONATA) 10 MG capsule   Have you contacted your pharmacy to request a refill? No   Which pharmacy would you like this sent to?  [facility] [place], [place] - [address] [facility] [address] [place] [address] Phone: [phone] Fax: [phone]    Patient notified that their request is being sent to the clinical staff for review and that they should receive a response within 2 business days.   Please advise at [place] [phone] (mobile)     Patient says she has one pill left and would also like to know if a 3 month supply can be sent in.

## 2022-09-30 NOTE — Telephone Encounter (Signed)
Called pt no answer LMOM w/MD response. Per chart refill was sent to to [facility]...Raechel Chute

## 2022-09-30 NOTE — Telephone Encounter (Signed)
There should be a refill available at the pharmacy.  Thanks

## 2022-10-01 ENCOUNTER — Other Ambulatory Visit: Payer: Self-pay | Admitting: Internal Medicine

## 2022-10-01 DIAGNOSIS — I2510 Atherosclerotic heart disease of native coronary artery without angina pectoris: Secondary | ICD-10-CM | POA: Insufficient documentation

## 2022-10-23 ENCOUNTER — Telehealth (HOSPITAL_COMMUNITY): Payer: Self-pay | Admitting: Surgery

## 2022-10-23 NOTE — Telephone Encounter (Signed)
Patient called and said that she had Covid in early May.  She has been experiencing "pitting edema" in her lower extremities and palptitations since that time.  She had previously seen Dr. Shirlee Latch (Last noted 2017) and during her recent PCP appt that physician recommend that she follow-up with cardiology.  I have scheduled an appt with APP for her to expedite an appt.  I explained that it may be appropriate after appt that she follow with Gen. Cardiology.

## 2022-10-30 ENCOUNTER — Telehealth (HOSPITAL_COMMUNITY): Payer: Self-pay

## 2022-10-30 ENCOUNTER — Encounter (HOSPITAL_COMMUNITY): Payer: Self-pay

## 2022-10-30 ENCOUNTER — Inpatient Hospital Stay (HOSPITAL_COMMUNITY)
Admission: RE | Admit: 2022-10-30 | Discharge: 2022-10-30 | Disposition: A | Discharge status: Home / Self Care | Payer: Medicare Other | Source: Ambulatory Visit | Attending: Cardiology | Admitting: Cardiology

## 2022-10-30 ENCOUNTER — Ambulatory Visit (HOSPITAL_COMMUNITY)
Admission: RE | Admit: 2022-10-30 | Discharge: 2022-10-30 | Disposition: A | Discharge status: Home / Self Care | Payer: Medicare Other | Source: Ambulatory Visit | Attending: Cardiology | Admitting: Cardiology

## 2022-10-30 ENCOUNTER — Other Ambulatory Visit (HOSPITAL_COMMUNITY): Payer: Self-pay | Admitting: Cardiology

## 2022-10-30 VITALS — BP 128/74 | HR 69 | Ht 66.0 in | Wt 127.0 lb

## 2022-10-30 DIAGNOSIS — I509 Heart failure, unspecified: Secondary | ICD-10-CM

## 2022-10-30 DIAGNOSIS — Z8679 Personal history of other diseases of the circulatory system: Secondary | ICD-10-CM

## 2022-10-30 DIAGNOSIS — R600 Localized edema: Secondary | ICD-10-CM

## 2022-10-30 DIAGNOSIS — Z87891 Personal history of nicotine dependence: Secondary | ICD-10-CM | POA: Insufficient documentation

## 2022-10-30 DIAGNOSIS — Z8616 Personal history of COVID-19: Secondary | ICD-10-CM | POA: Diagnosis not present

## 2022-10-30 DIAGNOSIS — Z8249 Family history of ischemic heart disease and other diseases of the circulatory system: Secondary | ICD-10-CM | POA: Insufficient documentation

## 2022-10-30 DIAGNOSIS — I5030 Unspecified diastolic (congestive) heart failure: Secondary | ICD-10-CM | POA: Diagnosis present

## 2022-10-30 DIAGNOSIS — R002 Palpitations: Secondary | ICD-10-CM

## 2022-10-30 LAB — CBC
HCT: 38.1 % (ref 36.0–46.0)
Hemoglobin: 13.4 g/dL (ref 12.0–15.0)
MCH: 32.4 pg (ref 26.0–34.0)
MCHC: 35.2 g/dL (ref 30.0–36.0)
MCV: 92 fL (ref 80.0–100.0)
Platelets: 253 10*3/uL (ref 150–400)
RBC: 4.14 MIL/uL (ref 3.87–5.11)
RDW: 12.4 % (ref 11.5–15.5)
WBC: 7.2 10*3/uL (ref 4.0–10.5)
nRBC: 0 % (ref 0.0–0.2)

## 2022-10-30 LAB — BASIC METABOLIC PANEL
Anion gap: 12 (ref 5–15)
BUN: 16 mg/dL (ref 8–23)
CO2: 27 mmol/L (ref 22–32)
Calcium: 9.5 mg/dL (ref 8.9–10.3)
Chloride: 102 mmol/L (ref 98–111)
Creatinine, Ser: 0.8 mg/dL (ref 0.44–1.00)
GFR, Estimated: >60 mL/min (ref >60)
Glucose, Bld: 82 mg/dL (ref 70–99)
Potassium: 4.2 mmol/L (ref 3.5–5.1)
Sodium: 141 mmol/L (ref 135–145)

## 2022-10-30 LAB — BRAIN NATRIURETIC PEPTIDE: B Natriuretic Peptide: 132.1 pg/mL — ABNORMAL HIGH (ref 0.0–100.0)

## 2022-10-30 LAB — TSH: TSH: 1.525 u[IU]/mL (ref 0.350–4.500)

## 2022-10-30 LAB — MAGNESIUM: Magnesium: 2.4 mg/dL (ref 1.7–2.4)

## 2022-10-30 MED ORDER — FUROSEMIDE 20 MG PO TABS: 20.0000 mg | ORAL_TABLET | Freq: Every day | ORAL | 5 refills | Status: AC

## 2022-10-30 NOTE — Addendum Note (Signed)
Encounter addended by: Faythe Casa, CMA on: 10/30/2022 1:56 PM  Actions taken: Clinical Note Signed

## 2022-10-30 NOTE — Patient Instructions (Addendum)
Medication Changes:    Lab Work:  Labs today We will only contact you if something comes back abnormal or we need to make some changes. Otherwise no news is good news!   Testing/Procedures: Your physician has requested that you have an echocardiogram. Echocardiography is a painless test that uses sound waves to create images of your heart. It provides your doctor with information about the size and shape of your heart and how well your heart's chambers and valves are working. This procedure takes approximately one hour. There are no restrictions for this procedure. Please do NOT wear cologne, perfume, aftershave, or lotions (deodorant is allowed). Please arrive 15 minutes prior to your appointment time.     Follow-Up in:   At the [facility], you and your health needs are our priority. We have a designated team specialized in the treatment of Heart Failure. This Care Team includes your primary Heart Failure Specialized Cardiologist (physician), Advanced Practice Providers (APPs- Physician Assistants and Nurse Practitioners), and Pharmacist who all work together to provide you with the care you need, when you need it.   You may see any of the following providers on your designated Care Team at your next follow up:  Dr. Arvilla Meres Dr. Marca Ancona Dr. Marcos Eke, NP Robbie Lis, [place] [facility] [place], [place] Brynda Peon, NP Karle Plumber, PharmD   Please be sure to bring in all your medications bottles to every appointment.   Need to Contact us:  If you have any questions or concerns before your next appointment please send [name] a message through [place] or call our office at [phone].    TO LEAVE A MESSAGE FOR THE NURSE SELECT OPTION 2, PLEASE LEAVE A MESSAGE INCLUDING: YOUR NAME DATE OF BIRTH CALL BACK NUMBER REASON FOR CALL**this is important as we prioritize the call backs  YOU WILL RECEIVE A CALL BACK THE SAME DAY  AS LONG AS YOU CALL BEFORE 4:00 PM

## 2022-10-30 NOTE — Telephone Encounter (Signed)
-----   Message from Allayne Butcher, [place] sent at 10/30/2022  3:23 PM EDT ----- BNP (fluid marker) is mildly elevated. Suspect the cause of leg swelling. Can start Lasix 20 mg daily and get repeat BMP and BNP in 1 wk. If swelling improves and if she is dropping weight too quickly, can change lasix to PRN use.   All other labs are ok.

## 2022-10-30 NOTE — Progress Notes (Signed)
[facility] Note   Patient ID: Rebecca Marks, female   DOB: 04/08/1956, 67 y.o.   MRN: [id number] PCP: Dr. Yetta Barre  67 yo with history of diastolic CHF exacerbation in setting of iatrogenic volume overload during episode of PNA in 2015. Echo at that time showed normal EF 60-65%, normal RV, no significant valvular abnormalities. She was slow to recover from a dyspnea standpoint after her bout w/ PNA. Had subsequent PFTs that demonstrated only minimal obstructive airways disease (suspected to have some degree of post PNA scaring). Holter monitor (7/15) showed only occasional PACs an no other arrhythmias. She was followed by Dr. Shirlee Latch briefly. Last visit was in 2017 and at that time advised to return PRN.   She now returns given development of LEE and palpitations after recently being diagnosed w/ COVID about 6 wks ago. Denies associated exertional dyspnea nor CP but sometimes feels SOB at night when reclining to sleep. Palpitations intermittent. Currently no symptoms. Denies recent n/v/d.   EKG today shows NSR 66 bpm. QTc 436 ms.    Labs (3/15): TSH normal Labs (4/15): BNP 1147 => 454  Labs (9/17): LDL 80, HDL 37, K 4.3, creatinine 4.09  PMH: 1. Osteoarthritis 2. Raynauds phenomenon 3. HCV: Spontaneously cleared.  4. H/o DVT in her 23s while on OCPs.  5. PNA (metapneumovirus) in 4/15 6. Echo (4/15) with EF 60-65%, normal RV, no significant valvular abnormalities.  7. PFTs (7/15) with minimal obstructive airways disease 8. PACs: Holter (7/15) with occasional PACs.   SH: Married, prior smoking, occasional ETOH.   FH: Father with MI x 3, earliest in his 78s.   ROS: All systems reviewed and negative except as per HPI.   Current Outpatient Medications  Medication Sig Dispense Refill   Ascorbic Acid (VITAMIN C) 1000 MG tablet Take 2,000 mg by mouth 2 (two) times daily as needed (immune system support).     cefUROXime (CEFTIN) 250 MG tablet Take 1 tablet (250 mg total) by  mouth 2 (two) times daily with a meal. 14 tablet 0   Cholecalciferol (VITAMIN D3) 125 MCG (5000 UT) CAPS Take 10,000 Units by mouth every evening.     diclofenac (VOLTAREN) 75 MG EC tablet TAKE 1 TABLET BY MOUTH TWICE A DAY 60 tablet 3   doxepin (SINEQUAN) 10 MG capsule TAKE 1 CAPSULE BY MOUTH EVERY DAY AT BEDTIME AS NEEDED 90 capsule 1   famotidine (PEPCID) 40 MG tablet TAKE 1 TABLET BY MOUTH EVERY DAY 90 tablet 3   methocarbamol (ROBAXIN) 500 MG tablet Take 1 tablet (500 mg total) by mouth every 6 (six) hours as needed for muscle spasms. 90 tablet 0   oxyCODONE-acetaminophen (PERCOCET) 10-325 MG tablet Take 1 tablet by mouth in the morning and at bedtime.     PHOSPHATIDYLSERINE PO Take 300 mg by mouth at bedtime.     psyllium (HYDROCIL/METAMUCIL) 95 % PACK Take 1 packet by mouth daily.     VOLTAREN 1 % GEL Apply 1 application topically in the morning and at bedtime. Applied to both knees and lower back     XTAMPZA ER 18 MG C12A Take 18 mg by mouth in the morning and at bedtime.     zaleplon (SONATA) 10 MG capsule Take 1 capsule (10 mg total) by mouth at bedtime as needed for sleep. 90 capsule 1   albuterol (VENTOLIN HFA) 108 (90 Base) MCG/ACT inhaler Inhale 2 puffs into the lungs every 6 (six) hours as needed for wheezing or shortness of breath. (  Patient not taking: Reported on 08/25/2022) 1 each 3   aspirin EC 81 MG tablet Take 1 tablet (81 mg total) by mouth daily. (Patient not taking: Reported on 08/25/2022) 100 tablet 3   CORAL CALCIUM PO Take 1 Dose by mouth. Ionic Calcium (Mixed in bottle of water--sand formulation)     magnesium hydroxide (MILK OF MAGNESIA) 400 MG/5ML suspension Take 15 mLs by mouth daily as needed for mild constipation.     naloxone (NARCAN) nasal spray 4 mg/0.1 mL 1 actuation in one nostril once. May repeat in 2-3 min (Patient not taking: Reported on 10/30/2022) 1 each 2   ondansetron (ZOFRAN) 4 MG tablet Take 1 tablet (4 mg total) by mouth every 8 (eight) hours as needed for  nausea or vomiting. (Patient not taking: Reported on 10/30/2022) 20 tablet 0   No current facility-administered medications for this encounter.    BP 128/74   Pulse 69   Ht 5\' 6"  (1.676 m)   Wt 57.6 kg (127 lb)   SpO2 100%   BMI 20.50 kg/m  PHYSICAL EXAM: General:  Well appearing. No respiratory difficulty HEENT: normal Neck: supple. no JVD. Carotids 2+ bilat; no bruits. No lymphadenopathy or thyromegaly appreciated. Cor: PMI nondisplaced. Regular rate & rhythm. No rubs, gallops or murmurs. Lungs: clear Abdomen: soft, nontender, nondistended. No hepatosplenomegaly. No bruits or masses. Good bowel sounds. Extremities: no cyanosis, clubbing, rash, trace b/l pedal/ankle edema (nonpitting) + varicose veins  Neuro: alert & oriented x 3, cranial nerves grossly intact. moves all 4 extremities w/o difficulty. Affect pleasant.   1. HFpEF  - developed iatrogenic volume overload due to a large amount of IV fluid with elevated BNP during hospitalization in 2015 for PNA. Echo was normal EF 60-65%, normal RV, no significant valvular abnormalities. Suspected to have some diastolic dysfunction. She has done fairly well since that time, but now w/ development of LEE and palpitations post COVID infection ~6 wks ago. Her LEE on exam is nonpitting and b/l varicose veins are appreciated. Denies leg pain. Suspect some degree of venous insuffiencey but need to r/o CHF.  - check BNP  - obtain 2D Echo to reassesses cardiac function   2. Palpitations - EKG today shows NSR 66 bpm, QTc 436 ms  - check TSH, BMP, Mg and CBC - 2 wk Holter  - Echo per above   F/u in 3-4 wks post echo and zio monitor.   Robbie Lis, PA-C

## 2022-10-30 NOTE — Telephone Encounter (Signed)
Patient advised and verbalized understanding,lab appointment scheduled,lab orders entered. New Rx sent into patients pharmacy.   Orders Placed This Encounter  Procedures   Basic metabolic panel    Standing Status:   Future    Standing Expiration Date:   10/30/2023    Order Specific Question:   Release to patient    Answer:   Immediate    Order Specific Question:   Release to patient    Answer:   Immediate [1]   B Nat Peptide    Standing Status:   Future    Standing Expiration Date:   10/30/2023    Order Specific Question:   Release to patient    Answer:   Immediate [1]   Meds ordered this encounter  Medications   furosemide (LASIX) 20 MG tablet    Sig: Take 1 tablet (20 mg total) by mouth daily.    Dispense:  30 tablet    Refill:  5

## 2022-10-30 NOTE — Progress Notes (Signed)
Your provider has recommended that  you wear a Zio Patch for 14 days.  This monitor will record your heart rhythm for our review.  IF you have any symptoms while wearing the monitor please press the button.  If you have any issues with the patch or you notice a red or orange light on it please call the company at [phone].  Once you remove the patch please mail it back to the company as soon as possible so we can get the results.   Zio patch placed onto patient.  All instructions and information reviewed with patient, they verbalize understanding with no questions.

## 2022-11-04 ENCOUNTER — Other Ambulatory Visit (HOSPITAL_COMMUNITY): Payer: Self-pay | Admitting: Cardiology

## 2022-11-04 DIAGNOSIS — Z8679 Personal history of other diseases of the circulatory system: Secondary | ICD-10-CM

## 2022-11-04 DIAGNOSIS — R002 Palpitations: Secondary | ICD-10-CM

## 2022-11-04 DIAGNOSIS — R600 Localized edema: Secondary | ICD-10-CM

## 2022-11-07 ENCOUNTER — Ambulatory Visit (HOSPITAL_COMMUNITY)
Admission: RE | Admit: 2022-11-07 | Discharge: 2022-11-07 | Disposition: A | Discharge status: Home / Self Care | Payer: Medicare Other | Source: Ambulatory Visit | Attending: Internal Medicine | Admitting: Internal Medicine

## 2022-11-07 ENCOUNTER — Other Ambulatory Visit (HOSPITAL_COMMUNITY): Payer: Self-pay

## 2022-11-07 DIAGNOSIS — R600 Localized edema: Secondary | ICD-10-CM | POA: Diagnosis present

## 2022-11-07 LAB — BASIC METABOLIC PANEL
Anion gap: 9 (ref 5–15)
BUN: 17 mg/dL (ref 8–23)
CO2: 30 mmol/L (ref 22–32)
Calcium: 9.5 mg/dL (ref 8.9–10.3)
Chloride: 99 mmol/L (ref 98–111)
Creatinine, Ser: 0.89 mg/dL (ref 0.44–1.00)
GFR, Estimated: >60 mL/min (ref >60)
Glucose, Bld: 94 mg/dL (ref 70–99)
Potassium: 4.2 mmol/L (ref 3.5–5.1)
Sodium: 138 mmol/L (ref 135–145)

## 2022-11-07 LAB — BRAIN NATRIURETIC PEPTIDE: B Natriuretic Peptide: 80.3 pg/mL (ref 0.0–100.0)

## 2022-11-07 MED ORDER — OXYCODONE-ACETAMINOPHEN 10-325 MG PO TABS
1.0000 | ORAL_TABLET | Freq: Four times a day (QID) | ORAL | 0 refills | Status: AC | PRN
  Filled 2022-11-07: qty 120, 30d supply, fill #0

## 2022-11-07 MED ORDER — METHOCARBAMOL 500 MG PO TABS
500.0000 mg | ORAL_TABLET | Freq: Four times a day (QID) | ORAL | 1 refills | Status: DC
  Filled 2022-11-07: qty 90, 23d supply, fill #0
  Filled 2023-02-16: qty 90, 23d supply, fill #1

## 2022-11-10 ENCOUNTER — Ambulatory Visit (HOSPITAL_COMMUNITY)
Admission: RE | Admit: 2022-11-10 | Discharge: 2022-11-10 | Disposition: A | Discharge status: Home / Self Care | Payer: Medicare Other | Source: Ambulatory Visit | Attending: Cardiology | Admitting: Cardiology

## 2022-11-10 DIAGNOSIS — I5032 Chronic diastolic (congestive) heart failure: Secondary | ICD-10-CM | POA: Diagnosis not present

## 2022-11-10 DIAGNOSIS — R600 Localized edema: Secondary | ICD-10-CM

## 2022-11-10 DIAGNOSIS — Z8679 Personal history of other diseases of the circulatory system: Secondary | ICD-10-CM | POA: Insufficient documentation

## 2022-11-10 DIAGNOSIS — Z87891 Personal history of nicotine dependence: Secondary | ICD-10-CM | POA: Insufficient documentation

## 2022-11-10 DIAGNOSIS — I2510 Atherosclerotic heart disease of native coronary artery without angina pectoris: Secondary | ICD-10-CM | POA: Insufficient documentation

## 2022-11-10 DIAGNOSIS — B1920 Unspecified viral hepatitis C without hepatic coma: Secondary | ICD-10-CM | POA: Insufficient documentation

## 2022-11-10 LAB — ECHOCARDIOGRAM COMPLETE
AR max vel: 2.09 cm2
AV Area VTI: 2.23 cm2
AV Area mean vel: 2.04 cm2
AV Mean grad: 3 mmHg
AV Peak grad: 4.8 mmHg
Ao pk vel: 1.1 m/s
Area-P 1/2: 3.3 cm2
Calc EF: 60.1 %
MV M vel: 3.28 m/s
MV Peak grad: 42.9 mmHg
S' Lateral: 1.9 cm
Single Plane A2C EF: 60.5 %
Single Plane A4C EF: 59.3 %

## 2022-11-19 ENCOUNTER — Other Ambulatory Visit (HOSPITAL_COMMUNITY): Payer: Self-pay

## 2022-11-19 MED ORDER — OXYCODONE HCL ER 20 MG PO T12A
20.0000 mg | EXTENDED_RELEASE_TABLET | Freq: Two times a day (BID) | ORAL | 0 refills | Status: DC
  Filled 2022-11-19 – 2022-11-24 (×2): qty 14, 7d supply, fill #0

## 2022-11-21 ENCOUNTER — Other Ambulatory Visit (HOSPITAL_COMMUNITY): Payer: Self-pay

## 2022-11-24 ENCOUNTER — Other Ambulatory Visit (HOSPITAL_COMMUNITY): Payer: Self-pay

## 2022-11-27 ENCOUNTER — Encounter (HOSPITAL_COMMUNITY): Payer: Medicare Other

## 2022-11-27 LAB — HM MAMMOGRAPHY

## 2022-12-01 ENCOUNTER — Other Ambulatory Visit (HOSPITAL_COMMUNITY): Payer: Self-pay

## 2022-12-01 ENCOUNTER — Encounter: Payer: Self-pay | Admitting: Internal Medicine

## 2022-12-01 MED ORDER — OXYCODONE HCL ER 20 MG PO T12A
20.0000 mg | EXTENDED_RELEASE_TABLET | Freq: Two times a day (BID) | ORAL | 0 refills | Status: DC
  Filled 2022-12-01: qty 60, 30d supply, fill #0

## 2022-12-01 MED ORDER — OXYCODONE-ACETAMINOPHEN 10-325 MG PO TABS: 1.0000 | ORAL_TABLET | Freq: Four times a day (QID) | ORAL | 0 refills | Status: DC | PRN

## 2022-12-01 NOTE — Progress Notes (Signed)
Advanced Heart Failure Clinic Note   Patient ID: Rebecca Marks, female   DOB: 10-15-1955, 67 y.o.   MRN: [id number] PCP: Tresa Garter, MD HF Cardiologist: Dr. Shirlee Latch  67 y.o. with history of diastolic CHF exacerbation in setting of iatrogenic volume overload during episode of PNA in 2015. Echo at that time showed normal EF 60-65%, normal RV, no significant valvular abnormalities. She was slow to recover from a dyspnea standpoint after her bout w/ PNA. Had subsequent PFTs that demonstrated only minimal obstructive airways disease (suspected to have some degree of post PNA scaring). Holter monitor (7/15) showed only occasional PACs an no other arrhythmias. She was followed by Dr. Shirlee Latch briefly. Last visit was in 2017 and at that time advised to return PRN.   Follow up 6/24 for an acute visit with LEE and palpitations. 2 week Zio and repeat echo arranged.  BNP mildly elevated and Lasix 20 mg daily started. Echo showed EF 60-65%, grade I DD, normal RV. 2 week Zio showed mostly NSR, rare PACs and PVCs, few short runs of SVT, seem to be symptomatic.   Today she returns for palpitation follow up. Overall feeling fine. Overwhelmed, her husband Merton Border is undergoing chemo/rad for tonsilar cancer. Busy with household duties and caring for her 4 cats. Continues with palpitations, not daily. She is not SOB walking on flat ground or with ADLs, if she takes her time. She is limited by chronic back pain. Denies  CP, dizziness, edema, or PND/Orthopnea. Appetite ok. No fever or chills. Weight at home 119 pounds. Taking all medications. She is a retired Technical brewer.  ECG (personally reviewed): NSR 69 bpm  Labs (3/15): TSH normal Labs (4/15): BNP 1147 => 454  Labs (9/17): LDL 80, HDL 37, K 4.3, creatinine 7.06 Labs (1/24): LDL 92 Labs (6/24): K 4.2, creatinine 0.89  PMH: 1. Osteoarthritis 2. Raynauds phenomenon 3. HCV: Spontaneously cleared.  4. H/o DVT in her 40s while on OCPs.  5. PNA  (metapneumovirus) in 4/15 6. Echo (4/15) with EF 60-65%, normal RV, no significant valvular abnormalities.  - Echo (6/24): EF 60-65%, grade I DD, normal RV 7. PFTs (7/15) with minimal obstructive airways disease 8. PACs: Holter (7/15) with occasional PACs.  - Zio 2 week (7/24): rare PACs/PVCs, short runs of SVT, symptomatic  SH: Married, prior smoking, occasional ETOH.   FH: Father with MI x 3, earliest in his 75s.   ROS: All systems reviewed and negative except as per HPI.   Current Outpatient Medications  Medication Sig Dispense Refill   Ascorbic Acid (VITAMIN C) 1000 MG tablet Take 2,000 mg by mouth 2 (two) times daily as needed (immune system support).     Cholecalciferol (VITAMIN D3) 125 MCG (5000 UT) CAPS Take 10,000 Units by mouth every evening.     CORAL CALCIUM PO Take 1 Dose by mouth. Ionic Calcium (Mixed in bottle of water--sand formulation)     diclofenac (VOLTAREN) 75 MG EC tablet TAKE 1 TABLET BY MOUTH TWICE A DAY 60 tablet 3   doxepin (SINEQUAN) 10 MG capsule TAKE 1 CAPSULE BY MOUTH EVERY DAY AT BEDTIME AS NEEDED 90 capsule 1   famotidine (PEPCID) 40 MG tablet TAKE 1 TABLET BY MOUTH EVERY DAY 90 tablet 3   furosemide (LASIX) 20 MG tablet Take 1 tablet (20 mg total) by mouth daily. 30 tablet 5   magnesium hydroxide (MILK OF MAGNESIA) 400 MG/5ML suspension Take 15 mLs by mouth daily as needed for mild constipation.     methocarbamol (  ROBAXIN) 500 MG tablet Take 1 tablet (500 mg total) by mouth every 6 (six) hours. 90 tablet 1   naloxone (NARCAN) nasal spray 4 mg/0.1 mL 1 actuation in one nostril once. May repeat in 2-3 min 1 each 2   oxyCODONE (OXYCONTIN) 20 mg 12 hr tablet Take 1 tablet (20 mg) by mouth every 12 hours (stop Xtampza) 60 tablet 0   oxyCODONE-acetaminophen (PERCOCET) 10-325 MG tablet Take 1 tablet by mouth 4 times daily as needed  for pain 120 tablet 0   PHOSPHATIDYLSERINE PO Take 300 mg by mouth at bedtime.     psyllium (HYDROCIL/METAMUCIL) 95 % PACK Take 1  packet by mouth daily.     VOLTAREN 1 % GEL Apply 1 application topically in the morning and at bedtime. Applied to both knees and lower back     zaleplon (SONATA) 10 MG capsule Take 1 capsule (10 mg total) by mouth at bedtime as needed for sleep. 90 capsule 1   [START ON 12/05/2022] oxyCODONE-acetaminophen (PERCOCET) 10-325 MG tablet Take 1 tablet by mouth 4 times daily as needed for pain (fill 12/05/22) (Patient not taking: Reported on 12/03/2022) 120 tablet 0   No current facility-administered medications for this encounter.   BP 112/72   Pulse 73   Wt 55.8 kg (123 lb)   SpO2 97%   BMI 19.85 kg/m   Wt Readings from Last 3 Encounters:  12/03/22 55.8 kg (123 lb)  10/30/22 57.6 kg (127 lb)  08/25/22 50.8 kg (112 lb)   Physical Exam General:  NAD. No resp difficulty, walked into clinic, tearful HEENT: Normal Neck: Supple. No JVD. Carotids 2+ bilat; no bruits. No lymphadenopathy or thryomegaly appreciated. Cor: PMI nondisplaced. Regular rate & rhythm. No rubs, gallops or murmurs. Lungs: Clear Abdomen: Soft, nontender, nondistended. No hepatosplenomegaly. No bruits or masses. Good bowel sounds. Extremities: No cyanosis, clubbing, rash, edema Neuro: Alert & oriented x 3, cranial nerves grossly intact. Moves all 4 extremities w/o difficulty. Affect pleasant.  Assessment/Plan Hx of diastolic dysfunction: - Developed iatrogenic volume overload due to a large amount of IV fluid with elevated BNP during hospitalization in 2015 for PNA. Echo was normal EF 60-65%, normal RV, no significant valvular abnormalities. Suspected to have some diastolic dysfunction. She has done fairly well since that time, but developed LEE and palpitations 10/2022, post COVID infection. Echo 6/24 showed EF 60-65%, grade I DD, normal RV. Today, she is not volume overloaded and NYHA II. - Continue Lasix 20 mg daily. Recent labs stable  2. Palpitations: - 2 week Zio showed mostly NSR with rare PACs/PVCs, but few runs of  SVT, symptomatic. - Echo 6/24 with stable EF 60-65% - Suspect 2/2 to post-COVID and recent life stressors. - Start Toprol XL 25 mg daily, can try PRN first.  Follow up PRN.  Anderson Malta Micco, FNP-BC  12/03/22

## 2022-12-03 ENCOUNTER — Encounter (HOSPITAL_COMMUNITY): Payer: Self-pay

## 2022-12-03 ENCOUNTER — Ambulatory Visit (HOSPITAL_COMMUNITY)
Admission: RE | Admit: 2022-12-03 | Discharge: 2022-12-03 | Disposition: A | Discharge status: Home / Self Care | Payer: Medicare Other | Source: Ambulatory Visit | Attending: Family Medicine | Admitting: Family Medicine

## 2022-12-03 VITALS — BP 112/72 | HR 73 | Wt 123.0 lb

## 2022-12-03 DIAGNOSIS — Z79899 Other long term (current) drug therapy: Secondary | ICD-10-CM | POA: Insufficient documentation

## 2022-12-03 DIAGNOSIS — I5030 Unspecified diastolic (congestive) heart failure: Secondary | ICD-10-CM | POA: Diagnosis not present

## 2022-12-03 DIAGNOSIS — R002 Palpitations: Secondary | ICD-10-CM | POA: Diagnosis not present

## 2022-12-03 DIAGNOSIS — M549 Dorsalgia, unspecified: Secondary | ICD-10-CM | POA: Insufficient documentation

## 2022-12-03 DIAGNOSIS — G8929 Other chronic pain: Secondary | ICD-10-CM | POA: Diagnosis not present

## 2022-12-03 DIAGNOSIS — Z8616 Personal history of COVID-19: Secondary | ICD-10-CM | POA: Insufficient documentation

## 2022-12-03 DIAGNOSIS — Z87891 Personal history of nicotine dependence: Secondary | ICD-10-CM | POA: Diagnosis not present

## 2022-12-03 DIAGNOSIS — Z8679 Personal history of other diseases of the circulatory system: Secondary | ICD-10-CM

## 2022-12-03 DIAGNOSIS — I471 Supraventricular tachycardia, unspecified: Secondary | ICD-10-CM | POA: Diagnosis not present

## 2022-12-03 MED ORDER — METOPROLOL SUCCINATE ER 25 MG PO TB24
ORAL_TABLET | ORAL | 11 refills | Status: DC
Start: 2022-12-03 — End: 2024-02-22

## 2022-12-03 NOTE — Patient Instructions (Signed)
Start Toprol XL 25 mg daily; can start taking only as needed for fast heart rate. Call us at [phone] if any issues or questions in the future.

## 2022-12-08 ENCOUNTER — Other Ambulatory Visit (HOSPITAL_COMMUNITY): Payer: Self-pay

## 2022-12-08 ENCOUNTER — Ambulatory Visit (INDEPENDENT_AMBULATORY_CARE_PROVIDER_SITE_OTHER): Payer: Medicare Other

## 2022-12-08 VITALS — Ht 66.0 in | Wt 121.6 lb

## 2022-12-08 DIAGNOSIS — Z0000 Encounter for general adult medical examination without abnormal findings: Secondary | ICD-10-CM | POA: Diagnosis not present

## 2022-12-08 MED ORDER — OXYCODONE-ACETAMINOPHEN 10-325 MG PO TABS
1.0000 | ORAL_TABLET | Freq: Four times a day (QID) | ORAL | 0 refills | Status: DC | PRN
  Filled 2022-12-08: qty 120, 30d supply, fill #0

## 2022-12-08 NOTE — Progress Notes (Addendum)
Subjective:   Rebecca Marks is a 67 y.o. female who presents for an Initial Medicare Annual Wellness Visit.  Visit Complete: In person   Review of Systems    Cardiac Risk Factors include: advanced age (>95men, >53 women), Risk factor comments: Coronary atherosclerosis     Objective:    Today's Vitals   12/08/22 1427  Weight: 121 lb 9.6 oz (55.2 kg)  Height: 5\' 6"  (1.676 m)  PainSc: 8    Body mass index is 19.63 kg/m.     12/08/2022    2:41 PM 04/02/2022    1:19 PM 11/20/2020    6:00 AM 11/14/2020    1:27 PM 02/08/2015    7:16 AM 09/03/2013   12:58 AM  Advanced Directives  Does Patient Have a Medical Advance Directive? Yes Yes Yes Yes Yes Patient has advance directive, copy not in chart  Type of Advance Directive Healthcare Power of [place];Living will Healthcare Power of [place];Living will Healthcare Power of [place];Living will Healthcare Power of [place];Living will Healthcare Power of [place];Living will Healthcare Power of [place];Living will  Does patient want to make changes to medical advance directive? No - Patient declined No - Patient declined    No change requested  Copy of Healthcare Power of Attorney in Chart? Yes - validated most recent copy scanned in chart (See row information) Yes - validated most recent copy scanned in chart (See row information) No - copy requested   Copy requested from family  Pre-existing out of facility DNR order (yellow form or pink MOST form)      No    Current Medications (verified) Outpatient Encounter Medications as of 12/08/2022  Medication Sig   Ascorbic Acid (VITAMIN C) 1000 MG tablet Take 2,000 mg by mouth 2 (two) times daily as needed (immune system support).   Cholecalciferol (VITAMIN D3) 125 MCG (5000 UT) CAPS Take 10,000 Units by mouth every evening.   CORAL CALCIUM PO Take 1 Dose by mouth. Ionic Calcium (Mixed in bottle of water--sand formulation)   diclofenac (VOLTAREN) 75 MG EC tablet TAKE 1 TABLET BY MOUTH  TWICE A DAY   doxepin (SINEQUAN) 10 MG capsule TAKE 1 CAPSULE BY MOUTH EVERY DAY AT BEDTIME AS NEEDED   famotidine (PEPCID) 40 MG tablet TAKE 1 TABLET BY MOUTH EVERY DAY   furosemide (LASIX) 20 MG tablet Take 1 tablet (20 mg total) by mouth daily.   magnesium hydroxide (MILK OF MAGNESIA) 400 MG/5ML suspension Take 15 mLs by mouth daily as needed for mild constipation.   methocarbamol (ROBAXIN) 500 MG tablet Take 1 tablet (500 mg total) by mouth every 6 (six) hours.   metoprolol succinate (TOPROL XL) 25 MG 24 hr tablet Take one tablet daily; may start taking as needed   oxyCODONE (OXYCONTIN) 20 mg 12 hr tablet Take 1 tablet (20 mg) by mouth every 12 hours (stop Xtampza)   oxyCODONE-acetaminophen (PERCOCET) 10-325 MG tablet Take 1 tablet by mouth 4 times daily as needed  for pain   PHOSPHATIDYLSERINE PO Take 300 mg by mouth at bedtime.   psyllium (HYDROCIL/METAMUCIL) 95 % PACK Take 1 packet by mouth daily.   VOLTAREN 1 % GEL Apply 1 application topically in the morning and at bedtime. Applied to both knees and lower back   zaleplon (SONATA) 10 MG capsule Take 1 capsule (10 mg total) by mouth at bedtime as needed for sleep.   naloxone (NARCAN) nasal spray 4 mg/0.1 mL 1 actuation in one nostril once. May repeat in 2-3 min (Patient  not taking: Reported on 12/08/2022)   [DISCONTINUED] oxyCODONE-acetaminophen (PERCOCET) 10-325 MG tablet Take 1 tablet by mouth 4 times daily as needed for pain (fill 12/05/22) (Patient not taking: Reported on 12/03/2022)   No facility-administered encounter medications on file as of 12/08/2022.    Allergies (verified) Cymbalta [duloxetine hcl], Ritalin [methylphenidate hcl], and Venlafaxine   History: Past Medical History:  Diagnosis Date   Allergy    Anxiety    Arthritis    R wrist and R knee   CAP (community acquired pneumonia) 4/17-22/15   Rochephin & IV Zithromax & 3 days Levaquin   Chronic back pain    Clotting disorder (HCC)    DVT (deep venous thrombosis)  (HCC)    a. in her 20s while on OCPs.   Dyspnea    GERD (gastroesophageal reflux disease)    Hepatitis C    contaminated blood @ her job, prior notes indicate spontaneously cleared   History of tobacco abuse    Osteoarthritis    Palpitations    Premature atrial contractions    a. Holter 11/2013: few PACs.   Pulmonary function study abnormality    a. 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   Raynaud phenomenon    Ruptured lumbar disc    4 herniated disc    Sleep apnea    Volume overload    a.  history of volume overload during hospital stay for PNA in 08/2013 felt iatrogenic due to receiving a lot of IV fluid. 2D echo 08/2013 showed EF 60-65%, normal diastolic function parameters.   Past Surgical History:  Procedure Laterality Date   CHOLECYSTECTOMY  1985   lumbar stem cell   2020   Family History  Problem Relation Age of Onset   Diabetes Father    Hypertension Father    Heart attack Father        >55   Lymphoma Father    Heart disease Father    Stroke Mother 36       after bedridden post fall with BLE paralysis   Breast cancer Mother         X 3   Breast cancer Sister    Alzheimer's disease Maternal Grandmother    Stroke Maternal Grandfather        in 38s   Colon cancer Neg Hx    Social History   Socioeconomic History   Marital status: Married    Spouse name: Not on file   Number of children: Not on file   Years of education: Not on file   Highest education level: Not on file  Occupational History   Not on file  Tobacco Use   Smoking status: Former    Current packs/day: 0.00    Average packs/day: 0.5 packs/day for 25.0 years (12.5 ttl pk-yrs)    Types: Cigarettes    Start date: 08/19/1988    Quit date: 08/19/2013    Years since quitting: 9.3   Smokeless tobacco: Never   Tobacco comments:    smoked 1979-2008, up to 1 ppd. As of 08/01/13 1 cig every 2 weeks  Vaping Use   Vaping status: Never Used  Substance and Sexual Activity   Alcohol use: Yes     Alcohol/week: 0.0 standard drinks of alcohol    Comment: 1 Beer nightly   Drug use: No    Comment: History-long time ago   Sexual activity: Yes    Birth control/protection: None  Other Topics Concern   Not on file  Social  History Narrative   She lives with husband. She does not have children.   She is a retired Engineer, civil (consulting).    She lives in 3 story home.    Social Determinants of Health   Financial Resource Strain: Low Risk  (12/08/2022)   Overall Financial Resource Strain (CARDIA)    Difficulty of Paying Living Expenses: Not very hard  Food Insecurity: No Food Insecurity (12/08/2022)   Hunger Vital Sign    Worried About Running Out of Food in the Last Year: Never true    Ran Out of Food in the Last Year: Never true  Transportation Needs: No Transportation Needs (12/08/2022)   PRAPARE - Administrator, Civil Service (Medical): No    Lack of Transportation (Non-Medical): No  Physical Activity: Insufficiently Active (12/08/2022)   Exercise Vital Sign    Days of Exercise per Week: 5 days    Minutes of Exercise per Session: 10 min  Stress: No Stress Concern Present (12/08/2022)   [employer] of Occupational Health - Occupational Stress Questionnaire    Feeling of Stress : Not at all  Social Connections: Moderately Integrated (12/08/2022)   Social Connection and Isolation Panel [NHANES]    Frequency of Communication with Friends and Family: More than three times a week    Frequency of Social Gatherings with Friends and Family: Once a week    Attends Religious Services: Never    Database administrator or Organizations: No    Attends Engineer, structural: 1 to 4 times per year    Marital Status: Married    Tobacco Counseling Counseling given: Not Answered Tobacco comments: smoked 1979-2008, up to 1 ppd. As of 08/01/13 1 cig every 2 weeks   Clinical Intake:  Pre-visit preparation completed: Yes  Pain : 0-10 Pain Score: 8  Pain Type: Acute pain     BMI  - recorded: 19.63 Nutritional Risks: None Diabetes: No  How often do you need to have someone help you when you read instructions, pamphlets, or other written materials from your doctor or pharmacy?: 1 - Never  Interpreter Needed?: No      Activities of Daily Living    12/08/2022    2:30 PM  In your present state of health, do you have any difficulty performing the following activities:  Hearing? 1  Comment hearing issues  Vision? 1  Difficulty concentrating or making decisions? 1  Comment per patient- because of medication  Walking or climbing stairs? 1  Dressing or bathing? 0  Doing errands, shopping? 0  Preparing Food and eating ? N  Using the Toilet? N  In the past six months, have you accidently leaked urine? N  Do you have problems with loss of bowel control? N  Managing your Medications? N  Managing your Finances? N  Housekeeping or managing your Housekeeping? N    Patient Care Team: Plotnikov, Georgina Quint, MD as PCP - General (Internal Medicine) Glendale Chard, DO as Consulting Physician (Neurology) Thyra Breed, MD (Anesthesiology) Fredrich Birks, OD as Referring Physician (Optometry)  Indicate any recent Medical Services you may have received from other than [facility] providers in the past year (date may be approximate).     Assessment:   This is a routine wellness examination for [facility].  Hearing/Vision screen Hearing Screening - Comments:: Having some trouble Vision Screening - Comments:: Wears eyeglasses  Dietary issues and exercise activities discussed:     Goals Addressed   None   Depression Screen  12/08/2022    2:52 PM 08/25/2022    2:36 PM 05/26/2022    2:12 PM 04/21/2022    3:07 PM 09/04/2021    1:53 PM 08/11/2019    3:04 PM 06/09/2018   11:42 AM  PHQ 2/9 Scores  PHQ - 2 Score 1 0 0 0 0 0 0  PHQ- 9 Score 1          Fall Risk    12/08/2022    2:42 PM 08/25/2022    2:35 PM 05/26/2022    2:12 PM 04/21/2022    3:07 PM 09/04/2021    1:53 PM  Fall  Risk   Falls in the past year? 1 1 0 0 0  Comment last 6 months-per patient      Number falls in past yr: 0 0 0 0 0  Injury with Fall? 0 0 0 0 0  Risk for fall due to : Medication side effect No Fall Risks No Fall Risks No Fall Risks No Fall Risks  Follow up  Falls evaluation completed Falls evaluation completed Falls evaluation completed Falls evaluation completed    MEDICARE RISK AT HOME:  Medicare Risk at Home - 12/08/22 1443     Any stairs in or around the home? Yes    If so, are there any without handrails? Yes    Home free of loose throw rugs in walkways, pet beds, electrical cords, etc? Yes    Adequate lighting in your home to reduce risk of falls? Yes    Life alert? No    Use of a cane, walker or w/c? Yes    Grab bars in the bathroom? No   in the works-per patient   Shower chair or bench in shower? Yes    Elevated toilet seat or a handicapped toilet? Yes   3 more to go-per patient            TIMED UP AND GO:  Was the test performed? Yes  Length of time to ambulate 10 feet: 25 sec Gait slow and steady with assistive device    Cognitive Function:        12/08/2022    2:44 PM  6CIT Screen  What Year? 0 points  What month? 0 points  What time? 0 points  Count back from 20 0 points  Months in reverse 0 points  Repeat phrase 0 points  Total Score 0 points    Immunizations Immunization History  Administered Date(s) Administered   Covid-19, Mrna,Vaccine(Spikevax)40yrs and older 08/22/2022   Fluad Quad(high Dose 65+) 04/02/2022   Hepatitis B 04/19/2010, 05/24/2010   Influenza Inj Mdck Quad Pf 02/27/2018, 02/02/2019, 01/18/2020   Influenza Whole 04/19/2010   Influenza, Seasonal, Injecte, Preservative Fre 04/06/2017   Influenza,inj,Quad PF,6+ Mos 04/07/2014, 05/02/2015, 02/08/2016, 02/02/2019   PFIZER Comirnaty(Gray Top)Covid-19 Tri-Sucrose Vaccine 06/15/2020   PFIZER(Purple Top)SARS-COV-2 Vaccination 08/05/2019, 08/18/2019, 08/30/2019, 01/27/2020   Pfizer  Covid-19 Vaccine Bivalent Booster 68yrs & up 02/04/2021, 09/12/2021, 04/02/2022   Pneumococcal Conjugate-13 09/11/2015   Rsv, Mab, Nirsevimab-alip, 1 Ml, Neonate To 24 Mos(Beyfortus) 04/02/2022   Td 05/19/2000, 08/02/2013   Zoster Recombinant(Shingrix) 04/21/2018, 07/08/2018    TDAP status: Up to date  Flu Vaccine status: Up to date  Pneumococcal vaccine status: Up to date  Covid-19 vaccine status: Completed vaccines  Qualifies for Shingles Vaccine? Yes   Zostavax completed Yes   Shingrix Completed?: Yes  Screening Tests Health Maintenance  Topic Date Due   Pneumonia Vaccine 51+ Years old (2 of 2 -  PPSV23 or PCV20) 11/06/2015   Colonoscopy  02/08/2020   INFLUENZA VACCINE  12/18/2022   COVID-19 Vaccine (10 - 2023-24 season) 12/22/2022   DTaP/Tdap/Td (3 - Tdap) 08/03/2023   Medicare Annual Wellness (AWV)  12/08/2023   MAMMOGRAM  11/26/2024   DEXA SCAN  Completed   Hepatitis C Screening  Completed   Zoster Vaccines- Shingrix  Completed   HPV VACCINES  Aged Out    Health Maintenance  Health Maintenance Due  Topic Date Due   Pneumonia Vaccine 17+ Years old (2 of 2 - PPSV23 or PCV20) 11/06/2015   Colonoscopy  02/08/2020    Colorectal cancer screening: Type of screening: Colonoscopy. Completed 02/08/2015. Repeat every 5 years  Mammogram status: Completed 11/27/2022. Repeat every year.  Bone Density status: Completed 03/21/2015. Results reflect: Bone density results: NORMAL. Repeat every 10 years.  Lung Cancer Screening: (Low Dose CT Chest recommended if Age 35-80 years, 20 pack-year currently smoking OR have quit w/in 15years.) does qualify.   Lung Cancer Screening Referral: 09/29/2022  Additional Screening:  Hepatitis C Screening: does qualify; Completed 07/23/2016  Vision Screening: Recommended annual ophthalmology exams for early detection of glaucoma and other disorders of the eye. Is the patient up to date with their annual eye exam?  Yes  Who is the provider or  what is the name of the office in which the patient attends annual eye exams? [facility] If pt is not established with a provider, would they like to be referred to a provider to establish care? No .   Dental Screening: Recommended annual dental exams for proper oral hygiene   Community Resource Referral / Chronic Care Management: CRR required this visit?  No   CCM required this visit?  No     Plan:     I have personally reviewed and noted the following in the patient's chart:   Medical and social history Use of alcohol, tobacco or illicit drugs  Current medications and supplements including opioid prescriptions. Patient is currently taking opioid prescriptions. Information provided to patient regarding non-opioid alternatives. Patient advised to discuss non-opioid treatment plan with their provider. Functional ability and status Nutritional status Physical activity Advanced directives List of other physicians Hospitalizations, surgeries, and ER visits in previous 12 months Vitals Screenings to include cognitive, depression, and falls Referrals and appointments  In addition, I have reviewed and discussed with patient certain preventive protocols, quality metrics, and best practice recommendations. A written personalized care plan for preventive services as well as general preventive health recommendations were provided to patient.     Rebecca Marks, CMA   12/08/2022   After Visit Summary: (MyChart) Due to this being a telephonic visit, the after visit summary with patients personalized plan was offered to patient via MyChart   Nurse Notes: Patient is not up to date on her coloscopy but will be scheduling soon. She has no concerns today. She has had her Lung CT screening on 09/29/2022.   Medical screening examination/treatment/procedure(s) were performed by non-physician practitioner and as supervising physician I was immediately available for consultation/collaboration.  I  agree with above. Jacinta Shoe, MD

## 2022-12-08 NOTE — Patient Instructions (Signed)
Rebecca Marks , Thank you for taking time to come for your Medicare Wellness Visit. I appreciate your ongoing commitment to your health goals. Please review the following plan we discussed and let me know if I can assist you in the future.   These are the goals we discussed:  Goals   None     This is a list of the screening recommended for you and due dates:  Health Maintenance  Topic Date Due   Pneumonia Vaccine (2 of 2 - PPSV23 or PCV20) 11/06/2015   Colon Cancer Screening  02/08/2020   Flu Shot  12/18/2022   COVID-19 Vaccine (10 - 2023-24 season) 12/22/2022   DTaP/Tdap/Td vaccine (3 - Tdap) 08/03/2023   Medicare Annual Wellness Visit  12/08/2023   Mammogram  11/26/2024   DEXA scan (bone density measurement)  Completed   Hepatitis C Screening  Completed   Zoster (Shingles) Vaccine  Completed   HPV Vaccine  Aged Out    Advanced directives: Has documents in chart.  Conditions/risks identified: Keep up the good work.  Remember to schedule your colonoscopy as it is over due, since last year. Each day, aim for 6 glasses of water, plenty of protein in your diet and try to get up and walk/ stretch every hour for 5-10 minutes at a time.     Next appointment: Follow up in one year for your annual wellness visit    Preventive Care 65 Years and Older, Female Preventive care refers to lifestyle choices and visits with your health care provider that can promote health and wellness. What does preventive care include? A yearly physical exam. This is also called an annual well check. Dental exams once or twice a year. Routine eye exams. Ask your health care provider how often you should have your eyes checked. Personal lifestyle choices, including: Daily care of your teeth and gums. Regular physical activity. Eating a healthy diet. Avoiding tobacco and drug use. Limiting alcohol use. Practicing safe sex. Taking low-dose aspirin every day. Taking vitamin and mineral supplements as  recommended by your health care provider. What happens during an annual well check? The services and screenings done by your health care provider during your annual well check will depend on your age, overall health, lifestyle risk factors, and family history of disease. Counseling  Your health care provider may ask you questions about your: Alcohol use. Tobacco use. Drug use. Emotional well-being. Home and relationship well-being. Sexual activity. Eating habits. History of falls. Memory and ability to understand (cognition). Work and work Astronomer. Reproductive health. Screening  You may have the following tests or measurements: Height, weight, and BMI. Blood pressure. Lipid and cholesterol levels. These may be checked every 5 years, or more frequently if you are over 75 years old. Skin check. Lung cancer screening. You may have this screening every year starting at age 35 if you have a 30-pack-year history of smoking and currently smoke or have quit within the past 15 years. Fecal occult blood test (FOBT) of the stool. You may have this test every year starting at age 42. Flexible sigmoidoscopy or colonoscopy. You may have a sigmoidoscopy every 5 years or a colonoscopy every 10 years starting at age 69. Hepatitis C blood test. Hepatitis B blood test. Sexually transmitted disease (STD) testing. Diabetes screening. This is done by checking your blood sugar (glucose) after you have not eaten for a while (fasting). You may have this done every 1-3 years. Bone density scan. This is done to screen  for osteoporosis. You may have this done starting at age 53. Mammogram. This may be done every 1-2 years. Talk to your health care provider about how often you should have regular mammograms. Talk with your health care provider about your test results, treatment options, and if necessary, the need for more tests. Vaccines  Your health care provider may recommend certain vaccines, such  as: Influenza vaccine. This is recommended every year. Tetanus, diphtheria, and acellular pertussis (Tdap, Td) vaccine. You may need a Td booster every 10 years. Zoster vaccine. You may need this after age 67. Pneumococcal 13-valent conjugate (PCV13) vaccine. One dose is recommended after age 35. Pneumococcal polysaccharide (PPSV23) vaccine. One dose is recommended after age 68. Talk to your health care provider about which screenings and vaccines you need and how often you need them. This information is not intended to replace advice given to you by your health care provider. Make sure you discuss any questions you have with your health care provider. Document Released: 06/01/2015 Document Revised: 01/23/2016 Document Reviewed: 03/06/2015 Elsevier Interactive Patient Education  2017 [employer].  Fall Prevention in the Home Falls can cause injuries. They can happen to people of all ages. There are many things you can do to make your home safe and to help prevent falls. What can I do on the outside of my home? Regularly fix the edges of walkways and driveways and fix any cracks. Remove anything that might make you trip as you walk through a door, such as a raised step or threshold. Trim any bushes or trees on the path to your home. Use bright outdoor lighting. Clear any walking paths of anything that might make someone trip, such as rocks or tools. Regularly check to see if handrails are loose or broken. Make sure that both sides of any steps have handrails. Any raised decks and porches should have guardrails on the edges. Have any leaves, snow, or ice cleared regularly. Use sand or salt on walking paths during winter. Clean up any spills in your garage right away. This includes oil or grease spills. What can I do in the bathroom? Use night lights. Install grab bars by the toilet and in the tub and shower. Do not use towel bars as grab bars. Use non-skid mats or decals in the tub or  shower. If you need to sit down in the shower, use a plastic, non-slip stool. Keep the floor dry. Clean up any water that spills on the floor as soon as it happens. Remove soap buildup in the tub or shower regularly. Attach bath mats securely with double-sided non-slip rug tape. Do not have throw rugs and other things on the floor that can make you trip. What can I do in the bedroom? Use night lights. Make sure that you have a light by your bed that is easy to reach. Do not use any sheets or blankets that are too big for your bed. They should not hang down onto the floor. Have a firm chair that has side arms. You can use this for support while you get dressed. Do not have throw rugs and other things on the floor that can make you trip. What can I do in the kitchen? Clean up any spills right away. Avoid walking on wet floors. Keep items that you use a lot in easy-to-reach places. If you need to reach something above you, use a strong step stool that has a grab bar. Keep electrical cords out of the way. Do  not use floor polish or wax that makes floors slippery. If you must use wax, use non-skid floor wax. Do not have throw rugs and other things on the floor that can make you trip. What can I do with my stairs? Do not leave any items on the stairs. Make sure that there are handrails on both sides of the stairs and use them. Fix handrails that are broken or loose. Make sure that handrails are as long as the stairways. Check any carpeting to make sure that it is firmly attached to the stairs. Fix any carpet that is loose or worn. Avoid having throw rugs at the top or bottom of the stairs. If you do have throw rugs, attach them to the floor with carpet tape. Make sure that you have a light switch at the top of the stairs and the bottom of the stairs. If you do not have them, ask someone to add them for you. What else can I do to help prevent falls? Wear shoes that: Do not have high heels. Have  rubber bottoms. Are comfortable and fit you well. Are closed at the toe. Do not wear sandals. If you use a stepladder: Make sure that it is fully opened. Do not climb a closed stepladder. Make sure that both sides of the stepladder are locked into place. Ask someone to hold it for you, if possible. Clearly mark and make sure that you can see: Any grab bars or handrails. First and last steps. Where the edge of each step is. Use tools that help you move around (mobility aids) if they are needed. These include: Canes. Walkers. Scooters. Crutches. Turn on the lights when you go into a dark area. Replace any light bulbs as soon as they burn out. Set up your furniture so you have a clear path. Avoid moving your furniture around. If any of your floors are uneven, fix them. If there are any pets around you, be aware of where they are. Review your medicines with your doctor. Some medicines can make you feel dizzy. This can increase your chance of falling. Ask your doctor what other things that you can do to help prevent falls. This information is not intended to replace advice given to you by your health care provider. Make sure you discuss any questions you have with your health care provider. Document Released: 03/01/2009 Document Revised: 10/11/2015 Document Reviewed: 06/09/2014 Elsevier Interactive Patient Education  2017 [employer].

## 2022-12-17 ENCOUNTER — Other Ambulatory Visit: Payer: Self-pay | Admitting: Internal Medicine

## 2022-12-23 ENCOUNTER — Other Ambulatory Visit (HOSPITAL_COMMUNITY): Payer: Self-pay

## 2022-12-23 MED ORDER — OXYCODONE HCL ER 20 MG PO T12A
20.0000 mg | EXTENDED_RELEASE_TABLET | Freq: Two times a day (BID) | ORAL | 0 refills | Status: DC
  Filled 2023-01-02 (×2): qty 60, 30d supply, fill #0

## 2022-12-23 MED ORDER — OXYCODONE-ACETAMINOPHEN 10-325 MG PO TABS: 1.0000 | ORAL_TABLET | Freq: Four times a day (QID) | ORAL | 0 refills | Status: DC | PRN

## 2022-12-25 ENCOUNTER — Ambulatory Visit (INDEPENDENT_AMBULATORY_CARE_PROVIDER_SITE_OTHER): Payer: Medicare Other | Admitting: Internal Medicine

## 2022-12-25 ENCOUNTER — Encounter: Payer: Self-pay | Admitting: Internal Medicine

## 2022-12-25 VITALS — BP 110/70 | HR 68 | Temp 98.6°F | Ht 66.0 in | Wt 120.2 lb

## 2022-12-25 DIAGNOSIS — R634 Abnormal weight loss: Secondary | ICD-10-CM

## 2022-12-25 DIAGNOSIS — Z8616 Personal history of COVID-19: Secondary | ICD-10-CM | POA: Insufficient documentation

## 2022-12-25 DIAGNOSIS — E559 Vitamin D deficiency, unspecified: Secondary | ICD-10-CM | POA: Diagnosis not present

## 2022-12-25 DIAGNOSIS — F439 Reaction to severe stress, unspecified: Secondary | ICD-10-CM

## 2022-12-25 DIAGNOSIS — F5101 Primary insomnia: Secondary | ICD-10-CM

## 2022-12-25 DIAGNOSIS — M5441 Lumbago with sciatica, right side: Secondary | ICD-10-CM

## 2022-12-25 DIAGNOSIS — G8929 Other chronic pain: Secondary | ICD-10-CM

## 2022-12-25 DIAGNOSIS — M5442 Lumbago with sciatica, left side: Secondary | ICD-10-CM

## 2022-12-25 NOTE — Assessment & Plan Note (Signed)
On Vit D - look at Vit D3+K2

## 2022-12-25 NOTE — Assessment & Plan Note (Signed)
Chronic  On doxepin.  Zaleplon prn 2019  Potential benefits of a long term benzodiazepines  use as well as potential risks  and complications were explained to the patient and were aknowledged.

## 2022-12-25 NOTE — Assessment & Plan Note (Signed)
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

## 2022-12-25 NOTE — Assessment & Plan Note (Signed)
Palpitations and ?CHF - resolved COVID (1st episode - April 2024)

## 2022-12-25 NOTE — Assessment & Plan Note (Signed)
Rebecca Marks is seeing Dr Vear Clock (on MS contin 30 mg bid)

## 2022-12-25 NOTE — Progress Notes (Signed)
Subjective:  Patient ID: Rebecca Marks, female    DOB: 10/20/1955  Age: 67 y.o. MRN: [id number]  CC: Follow-up (4 [facility] F/U, Discuss a stress in her life.)   HPI Rebecca Marks presents for a f/u: LBP, palpitations post-COVID (1st episode - April 2024), insomnia Seeing Dr Vear Clock     Outpatient Medications Prior to Visit  Medication Sig Dispense Refill   Ascorbic Acid (VITAMIN C) 1000 MG tablet Take 2,000 mg by mouth 2 (two) times daily as needed (immune system support).     Cholecalciferol (VITAMIN D3) 125 MCG (5000 UT) CAPS Take 10,000 Units by mouth every evening.     CORAL CALCIUM PO Take 1 Dose by mouth. Ionic Calcium (Mixed in bottle of water--sand formulation)     diclofenac (VOLTAREN) 75 MG EC tablet TAKE 1 TABLET BY MOUTH TWICE A DAY 60 tablet 3   doxepin (SINEQUAN) 10 MG capsule TAKE 1 CAPSULE BY MOUTH EVERY DAY AT BEDTIME AS NEEDED 90 capsule 1   famotidine (PEPCID) 40 MG tablet TAKE 1 TABLET BY MOUTH EVERY DAY 90 tablet 3   furosemide (LASIX) 20 MG tablet Take 1 tablet (20 mg total) by mouth daily. 30 tablet 5   magnesium hydroxide (MILK OF MAGNESIA) 400 MG/5ML suspension Take 15 mLs by mouth daily as needed for mild constipation.     methocarbamol (ROBAXIN) 500 MG tablet Take 1 tablet (500 mg total) by mouth every 6 (six) hours. 90 tablet 1   metoprolol succinate (TOPROL XL) 25 MG 24 hr tablet Take one tablet daily; may start taking as needed 30 tablet 11   naloxone (NARCAN) nasal spray 4 mg/0.1 mL 1 actuation in one nostril once. May repeat in 2-3 min 1 each 2   oxyCODONE (OXYCONTIN) 20 mg 12 hr tablet Take 1 tablet (20 mg) by mouth every 12 hours (stop Xtampza) 60 tablet 0   [START ON 12/30/2022] oxyCODONE (OXYCONTIN) 20 mg 12 hr tablet Take 1 tablet (20 mg total) by mouth every 12 (twelve) hours. (fill 12/30/22) 60 tablet 0   oxyCODONE-acetaminophen (PERCOCET) 10-325 MG tablet Take 1 tablet by mouth 4 times daily as needed  for pain 120 tablet 0    oxyCODONE-acetaminophen (PERCOCET) 10-325 MG tablet Take 1 tablet by mouth 4 times daily as needed for pain 120 tablet 0   [START ON 01/06/2023] oxyCODONE-acetaminophen (PERCOCET) 10-325 MG tablet Take 1 tablet by mouth 4 (four) times daily as needed for pain (fill 01/06/23) 120 tablet 0   PHOSPHATIDYLSERINE PO Take 300 mg by mouth at bedtime.     psyllium (HYDROCIL/METAMUCIL) 95 % PACK Take 1 packet by mouth daily.     VOLTAREN 1 % GEL Apply 1 application topically in the morning and at bedtime. Applied to both knees and lower back     zaleplon (SONATA) 10 MG capsule Take 1 capsule (10 mg total) by mouth at bedtime as needed for sleep. 90 capsule 1   No facility-administered medications prior to visit.    ROS: Review of Systems  Constitutional:  Negative for activity change, appetite change, chills, fatigue and unexpected weight change.  HENT:  Negative for congestion, mouth sores and sinus pressure.   Eyes:  Negative for visual disturbance.  Respiratory:  Negative for cough and chest tightness.   Gastrointestinal:  Negative for abdominal pain and nausea.  Genitourinary:  Negative for difficulty urinating, frequency and vaginal pain.  Musculoskeletal:  Negative for back pain and gait problem.  Skin:  Negative for pallor and rash.  Neurological:  Negative for dizziness, tremors, weakness, numbness and headaches.  Psychiatric/Behavioral:  Negative for confusion, sleep disturbance and suicidal ideas.     Objective:  BP 110/70 (BP Location: Right Arm, Patient Position: Sitting, Cuff Size: Large)   Pulse 68   Temp 98.6 F (37 C) (Oral)   Ht 5\' 6"  (1.676 m)   Wt 120 lb 3 oz (54.5 kg)   SpO2 99%   BMI 19.40 kg/m   BP Readings from Last 3 Encounters:  12/25/22 110/70  12/03/22 112/72  10/30/22 128/74    Wt Readings from Last 3 Encounters:  12/25/22 120 lb 3 oz (54.5 kg)  12/08/22 121 lb 9.6 oz (55.2 kg)  12/03/22 123 lb (55.8 kg)    Physical Exam Constitutional:      General:  She is not in acute distress.    Appearance: She is well-developed. She is obese.  HENT:     Head: Normocephalic.     Right Ear: External ear normal.     Left Ear: External ear normal.     Nose: Nose normal.  Eyes:     General:        Right eye: No discharge.        Left eye: No discharge.     Conjunctiva/sclera: Conjunctivae normal.     Pupils: Pupils are equal, round, and reactive to light.  Neck:     Thyroid: No thyromegaly.     Vascular: No JVD.     Trachea: No tracheal deviation.  Cardiovascular:     Rate and Rhythm: Normal rate and regular rhythm.     Heart sounds: Normal heart sounds.  Pulmonary:     Effort: No respiratory distress.     Breath sounds: No stridor. No wheezing.  Abdominal:     General: Bowel sounds are normal. There is no distension.     Palpations: Abdomen is soft. There is no mass.     Tenderness: There is no abdominal tenderness. There is no guarding or rebound.  Musculoskeletal:        General: No tenderness.     Cervical back: Normal range of motion and neck supple. No rigidity.  Lymphadenopathy:     Cervical: No cervical adenopathy.  Skin:    Findings: No erythema or rash.  Neurological:     Mental Status: She is oriented to person, place, and time.     Cranial Nerves: No cranial nerve deficit.     Motor: No abnormal muscle tone.     Coordination: Coordination normal.     Deep Tendon Reflexes: Reflexes normal.  Psychiatric:        Behavior: Behavior normal.        Thought Content: Thought content normal.        Judgment: Judgment normal.     Lab Results  Component Value Date   WBC 7.2 10/30/2022   HGB 13.4 10/30/2022   HCT 38.1 10/30/2022   PLT 253 10/30/2022   GLUCOSE 94 11/07/2022   CHOL 164 08/26/2021   TRIG 344.0 (H) 08/26/2021   HDL 38.00 (L) 08/26/2021   LDLDIRECT 92.0 08/26/2021   LDLCALC 90 04/21/2018   ALT 4 08/26/2021   AST 15 08/26/2021   NA 138 11/07/2022   K 4.2 11/07/2022   CL 99 11/07/2022   CREATININE 0.89  11/07/2022   BUN 17 11/07/2022   CO2 30 11/07/2022   TSH 1.525 10/30/2022   INR 1.0 09/12/2015   HGBA1C 4.4 (L) 07/30/2006    No results found.  Assessment & Plan:   Problem List Items Addressed This Visit     Chronic low back pain    Rebecca Marks is seeing Dr Vear Clock (on MS contin 30 mg bid)       Vitamin D deficiency    On Vit D - look at Vit D3+K2      Stress at home    Alinda Money has tonsillar cancer - on chemo/XRT      Insomnia    Chronic  On doxepin.  Zaleplon prn 2019  Potential benefits of a long term benzodiazepines  use as well as potential risks  and complications were explained to the patient and were aknowledged.      Weight loss    Resolved      History of COVID-19 - Primary    Palpitations and ?CHF - resolved COVID (1st episode - April 2024)         No orders of the defined types were placed in this encounter.     Follow-up: Return in about 4 months (around 04/26/2023) for a follow-up visit.  Sonda Primes, MD

## 2022-12-25 NOTE — Assessment & Plan Note (Signed)
Resolved

## 2023-01-02 ENCOUNTER — Other Ambulatory Visit (HOSPITAL_COMMUNITY): Payer: Self-pay

## 2023-01-05 ENCOUNTER — Other Ambulatory Visit (HOSPITAL_COMMUNITY): Payer: Self-pay

## 2023-01-05 MED ORDER — OXYCODONE-ACETAMINOPHEN 10-325 MG PO TABS
1.0000 | ORAL_TABLET | Freq: Every day | ORAL | 0 refills | Status: DC | PRN
  Filled 2023-01-05 – 2023-01-07 (×2): qty 150, 30d supply, fill #0

## 2023-01-07 ENCOUNTER — Other Ambulatory Visit (HOSPITAL_COMMUNITY): Payer: Self-pay

## 2023-01-08 ENCOUNTER — Other Ambulatory Visit (HOSPITAL_COMMUNITY): Payer: Self-pay

## 2023-01-21 ENCOUNTER — Other Ambulatory Visit (HOSPITAL_COMMUNITY): Payer: Self-pay

## 2023-01-21 MED ORDER — FENTANYL 25 MCG/HR TD PT72
1.0000 | MEDICATED_PATCH | TRANSDERMAL | 0 refills | Status: DC
  Filled 2023-01-21: qty 10, 30d supply, fill #0

## 2023-01-22 ENCOUNTER — Other Ambulatory Visit (HOSPITAL_COMMUNITY): Payer: Self-pay

## 2023-02-04 ENCOUNTER — Other Ambulatory Visit (HOSPITAL_COMMUNITY): Payer: Self-pay

## 2023-02-04 MED ORDER — OXYCODONE-ACETAMINOPHEN 10-325 MG PO TABS
1.0000 | ORAL_TABLET | Freq: Four times a day (QID) | ORAL | 0 refills | Status: DC | PRN
  Filled 2023-02-06: qty 120, 30d supply, fill #0

## 2023-02-04 MED ORDER — FENTANYL 25 MCG/HR TD PT72: 1.0000 | MEDICATED_PATCH | TRANSDERMAL | 0 refills | Status: DC

## 2023-02-05 ENCOUNTER — Other Ambulatory Visit: Payer: Self-pay | Admitting: Internal Medicine

## 2023-02-06 ENCOUNTER — Other Ambulatory Visit (HOSPITAL_COMMUNITY): Payer: Self-pay

## 2023-02-17 ENCOUNTER — Other Ambulatory Visit (HOSPITAL_COMMUNITY): Payer: Self-pay

## 2023-02-18 ENCOUNTER — Other Ambulatory Visit (HOSPITAL_COMMUNITY): Payer: Self-pay

## 2023-02-18 MED ORDER — OXYCODONE-ACETAMINOPHEN 10-325 MG PO TABS
1.0000 | ORAL_TABLET | ORAL | 0 refills | Status: DC | PRN
  Filled 2023-03-17: qty 90, 15d supply, fill #0

## 2023-02-19 ENCOUNTER — Other Ambulatory Visit (HOSPITAL_COMMUNITY): Payer: Self-pay

## 2023-02-23 ENCOUNTER — Other Ambulatory Visit: Payer: Self-pay | Admitting: Internal Medicine

## 2023-02-23 MED ORDER — ZALEPLON 10 MG PO CAPS: 10.0000 mg | ORAL_CAPSULE | Freq: Every evening | ORAL | 1 refills | Status: DC | PRN

## 2023-03-02 ENCOUNTER — Other Ambulatory Visit (HOSPITAL_COMMUNITY): Payer: Self-pay

## 2023-03-02 MED ORDER — PREGABALIN 75 MG PO CAPS
75.0000 mg | ORAL_CAPSULE | Freq: Two times a day (BID) | ORAL | 1 refills | Status: DC
  Filled 2023-03-02: qty 60, 30d supply, fill #0

## 2023-03-03 ENCOUNTER — Other Ambulatory Visit (HOSPITAL_COMMUNITY): Payer: Self-pay

## 2023-03-03 MED ORDER — PREGABALIN 25 MG PO CAPS
25.0000 mg | ORAL_CAPSULE | Freq: Three times a day (TID) | ORAL | 1 refills | Status: DC
  Filled 2023-03-03: qty 90, 30d supply, fill #0

## 2023-03-17 ENCOUNTER — Other Ambulatory Visit (HOSPITAL_COMMUNITY): Payer: Self-pay

## 2023-03-19 ENCOUNTER — Other Ambulatory Visit: Payer: Self-pay

## 2023-03-19 ENCOUNTER — Telehealth: Payer: Self-pay | Admitting: Internal Medicine

## 2023-03-19 MED ORDER — ALBUTEROL SULFATE HFA 108 (90 BASE) MCG/ACT IN AERS: 2.0000 | INHALATION_SPRAY | Freq: Four times a day (QID) | RESPIRATORY_TRACT | 3 refills | Status: DC | PRN

## 2023-03-19 NOTE — Telephone Encounter (Signed)
Patient had an albuteral inhaler to be used as needed for SoB or wheezing. She said she ran out of the medication and would like to know if she can get a refill sent to [id number] - [place], [place] - [address] Best callback is [phone].

## 2023-03-20 ENCOUNTER — Other Ambulatory Visit (HOSPITAL_COMMUNITY): Payer: Self-pay

## 2023-03-30 ENCOUNTER — Other Ambulatory Visit (HOSPITAL_COMMUNITY): Payer: Self-pay

## 2023-03-30 MED ORDER — OXYCODONE-ACETAMINOPHEN 10-325 MG PO TABS
1.0000 | ORAL_TABLET | ORAL | 0 refills | Status: DC | PRN
  Filled 2023-04-07: qty 180, 30d supply, fill #0

## 2023-03-31 ENCOUNTER — Other Ambulatory Visit (HOSPITAL_COMMUNITY): Payer: Self-pay

## 2023-04-07 ENCOUNTER — Other Ambulatory Visit (HOSPITAL_COMMUNITY): Payer: Self-pay

## 2023-04-08 ENCOUNTER — Other Ambulatory Visit (HOSPITAL_COMMUNITY): Payer: Self-pay

## 2023-04-15 ENCOUNTER — Other Ambulatory Visit (HOSPITAL_COMMUNITY): Payer: Self-pay

## 2023-04-15 MED ORDER — PREGABALIN 25 MG PO CAPS
25.0000 mg | ORAL_CAPSULE | Freq: Three times a day (TID) | ORAL | 1 refills | Status: DC
  Filled 2023-04-15: qty 90, 30d supply, fill #0

## 2023-04-17 ENCOUNTER — Other Ambulatory Visit (HOSPITAL_COMMUNITY): Payer: Self-pay

## 2023-04-23 ENCOUNTER — Other Ambulatory Visit (HOSPITAL_COMMUNITY): Payer: Self-pay

## 2023-04-27 ENCOUNTER — Ambulatory Visit: Payer: Medicare Other | Admitting: Internal Medicine

## 2023-04-28 ENCOUNTER — Other Ambulatory Visit (HOSPITAL_COMMUNITY): Payer: Self-pay

## 2023-04-28 ENCOUNTER — Ambulatory Visit: Payer: Medicare Other | Admitting: Internal Medicine

## 2023-04-28 MED ORDER — OXYCODONE-ACETAMINOPHEN 10-325 MG PO TABS
1.0000 | ORAL_TABLET | ORAL | 0 refills | Status: DC | PRN
  Filled 2023-07-06: qty 180, 30d supply, fill #0

## 2023-05-06 ENCOUNTER — Other Ambulatory Visit (HOSPITAL_COMMUNITY): Payer: Self-pay

## 2023-05-06 MED ORDER — OXYCODONE-ACETAMINOPHEN 10-325 MG PO TABS
1.0000 | ORAL_TABLET | ORAL | 0 refills | Status: DC | PRN
  Filled 2023-05-06: qty 180, 30d supply, fill #0

## 2023-05-06 MED ORDER — METHOCARBAMOL 500 MG PO TABS
500.0000 mg | ORAL_TABLET | Freq: Three times a day (TID) | ORAL | 1 refills | Status: DC | PRN
  Filled 2023-05-06: qty 90, 30d supply, fill #0

## 2023-05-08 ENCOUNTER — Other Ambulatory Visit (HOSPITAL_COMMUNITY): Payer: Self-pay

## 2023-06-04 ENCOUNTER — Other Ambulatory Visit (HOSPITAL_COMMUNITY): Payer: Self-pay

## 2023-06-04 MED ORDER — OXYCODONE-ACETAMINOPHEN 10-325 MG PO TABS
ORAL_TABLET | ORAL | 0 refills | Status: DC
  Filled 2023-06-04: qty 180, 30d supply, fill #0

## 2023-06-04 MED ORDER — DOXEPIN HCL 25 MG PO CAPS
25.0000 mg | ORAL_CAPSULE | Freq: Every day | ORAL | 1 refills | Status: DC | PRN
  Filled 2023-06-04: qty 180, 90d supply, fill #0

## 2023-06-05 ENCOUNTER — Other Ambulatory Visit (HOSPITAL_COMMUNITY): Payer: Self-pay

## 2023-06-05 ENCOUNTER — Other Ambulatory Visit: Payer: Self-pay

## 2023-06-06 ENCOUNTER — Other Ambulatory Visit (HOSPITAL_COMMUNITY): Payer: Self-pay

## 2023-06-07 ENCOUNTER — Other Ambulatory Visit: Payer: Self-pay

## 2023-06-15 ENCOUNTER — Other Ambulatory Visit (HOSPITAL_COMMUNITY): Payer: Self-pay

## 2023-06-15 MED ORDER — OXYCODONE-ACETAMINOPHEN 10-325 MG PO TABS
1.0000 | ORAL_TABLET | ORAL | 0 refills | Status: DC | PRN
  Filled 2023-08-04: qty 180, 30d supply, fill #0

## 2023-06-23 ENCOUNTER — Ambulatory Visit: Payer: Medicare Other | Admitting: Internal Medicine

## 2023-06-23 ENCOUNTER — Encounter: Payer: Self-pay | Admitting: Internal Medicine

## 2023-06-23 VITALS — BP 106/70 | HR 77 | Temp 98.6°F | Ht 66.0 in

## 2023-06-23 DIAGNOSIS — E559 Vitamin D deficiency, unspecified: Secondary | ICD-10-CM

## 2023-06-23 DIAGNOSIS — M5442 Lumbago with sciatica, left side: Secondary | ICD-10-CM

## 2023-06-23 DIAGNOSIS — R5382 Chronic fatigue, unspecified: Secondary | ICD-10-CM | POA: Diagnosis not present

## 2023-06-23 DIAGNOSIS — L853 Xerosis cutis: Secondary | ICD-10-CM

## 2023-06-23 DIAGNOSIS — G8929 Other chronic pain: Secondary | ICD-10-CM

## 2023-06-23 DIAGNOSIS — F439 Reaction to severe stress, unspecified: Secondary | ICD-10-CM

## 2023-06-23 DIAGNOSIS — M5441 Lumbago with sciatica, right side: Secondary | ICD-10-CM

## 2023-06-23 MED ORDER — ZALEPLON 10 MG PO CAPS: 10.0000 mg | ORAL_CAPSULE | Freq: Every evening | ORAL | 1 refills | Status: DC | PRN

## 2023-06-23 MED ORDER — TRIAMCINOLONE ACETONIDE 0.1 % EX CREA
1.0000 | TOPICAL_CREAM | Freq: Two times a day (BID) | CUTANEOUS | 3 refills | Status: DC
Start: 2023-06-23 — End: 2024-02-22

## 2023-06-23 NOTE — Assessment & Plan Note (Signed)
 On Vit D

## 2023-06-23 NOTE — Progress Notes (Signed)
 Subjective:  Patient ID: Rebecca Marks, female    DOB: May 12, 1956  Age: 68 y.o. MRN: [id number]  CC: Referral   HPI Rebecca Marks presents for LBP - planning another LS spine fusion at [facility] - Dr Valere  F/u insomnia  Dry skin on back    Outpatient Medications Prior to Visit  Medication Sig Dispense Refill   albuterol  (VENTOLIN  HFA) 108 (90 Base) MCG/ACT inhaler Inhale 2 puffs into the lungs every 6 (six) hours as needed for wheezing or shortness of breath. 1 each 3   Ascorbic Acid  (VITAMIN C) 1000 MG tablet Take 2,000 mg by mouth 2 (two) times daily as needed (immune system support).     Cholecalciferol (VITAMIN D3) 125 MCG (5000 UT) CAPS Take 10,000 Units by mouth every evening.     CORAL CALCIUM PO Take 1 Dose by mouth. Ionic Calcium (Mixed in bottle of water--sand formulation)     diclofenac  (VOLTAREN ) 75 MG EC tablet TAKE 1 TABLET BY MOUTH TWICE A DAY 60 tablet 3   doxepin  (SINEQUAN ) 10 MG capsule TAKE 1 CAPSULE BY MOUTH EVERY DAY AT BEDTIME AS NEEDED 90 capsule 1   doxepin  (SINEQUAN ) 25 MG capsule Take 1-2 capsules (25-50 mg total) by mouth daily as needed. 180 capsule 1   famotidine  (PEPCID ) 40 MG tablet TAKE 1 TABLET BY MOUTH EVERY DAY 90 tablet 3   fentaNYL  (DURAGESIC ) 25 MCG/HR Place 1 patch onto the skin every 72 hours (stop oxycontin ) 10 patch 0   fentaNYL  (DURAGESIC ) 25 MCG/HR Place 1 patch onto the skin every 3 (three) days (every 72 hours). (fill 02/19/23) 10 patch 0   furosemide  (LASIX ) 20 MG tablet Take 1 tablet (20 mg total) by mouth daily. 30 tablet 5   magnesium hydroxide (MILK OF MAGNESIA) 400 MG/5ML suspension Take 15 mLs by mouth daily as needed for mild constipation.     methocarbamol  (ROBAXIN ) 500 MG tablet Take 1 tablet (500 mg total) by mouth every 6 (six) hours. 90 tablet 1   methocarbamol  (ROBAXIN ) 500 MG tablet Take 1 tablet (500 mg total) by mouth 3 (three) times daily as needed. 90 tablet 1   metoprolol  succinate (TOPROL  XL) 25 MG 24 hr tablet  Take one tablet daily; may start taking as needed 30 tablet 11   naloxone  (NARCAN ) nasal spray 4 mg/0.1 mL 1 actuation in one nostril once. May repeat in 2-3 min 1 each 2   oxyCODONE  (OXYCONTIN ) 20 mg 12 hr tablet Take 1 tablet (20 mg) by mouth every 12 hours (stop Xtampza ) 60 tablet 0   oxyCODONE  (OXYCONTIN ) 20 mg 12 hr tablet Take 1 tablet (20 mg total) by mouth every 12 (twelve) hours. (fill 12/30/22) 60 tablet 0   oxyCODONE -acetaminophen  (PERCOCET) 10-325 MG tablet Take 1 tablet by mouth 4 times daily as needed  for pain 120 tablet 0   oxyCODONE -acetaminophen  (PERCOCET) 10-325 MG tablet Take 1 tablet by mouth 4 times daily as needed for pain 120 tablet 0   oxyCODONE -acetaminophen  (PERCOCET) 10-325 MG tablet Take 1 tablet by mouth 5 (five) times daily as needed for pain 150 tablet 0   oxyCODONE -acetaminophen  (PERCOCET) 10-325 MG tablet Take 1 tablet by mouth 4 (four) times daily as needed for pain (fill 02/05/23) 120 tablet 0   oxyCODONE -acetaminophen  (PERCOCET) 10-325 MG tablet Take 1 tablet by mouth every 4 (four) hours as needed for pain (stop fentanyl ) 90 tablet 0   oxyCODONE -acetaminophen  (PERCOCET) 10-325 MG tablet Take 1 tablet by mouth every 4 (four) hours as needed  for pain 180 tablet 0   oxyCODONE -acetaminophen  (PERCOCET) 10-325 MG tablet Take 1 tablet by mouth every 4 (four) hours as needed for pain 180 tablet 0   oxyCODONE -acetaminophen  (PERCOCET) 10-325 MG tablet Take 1 tablet by mouth every 4 (four) hours as needed for pain. 180 tablet 0   oxyCODONE -acetaminophen  (PERCOCET) 10-325 MG tablet Take 1 tablet by mouth every four hours as needed for pain 180 tablet 0   [START ON 07/04/2023] oxyCODONE -acetaminophen  (PERCOCET) 10-325 MG tablet Take 1 tablet by mouth every 4 (four) hours as needed for pain 180 tablet 0   PHOSPHATIDYLSERINE PO Take 300 mg by mouth at bedtime.     pregabalin  (LYRICA ) 25 MG capsule Take 1 capsule (25 mg total) by mouth 3 (three) times daily. 90 capsule 1    pregabalin  (LYRICA ) 25 MG capsule Take 1 capsule (25 mg total) by mouth 3 (three) times daily. 90 capsule 1   pregabalin  (LYRICA ) 75 MG capsule Take 1 capsule (75 mg total) by mouth 2 (two) times daily. 60 capsule 1   psyllium (HYDROCIL/METAMUCIL) 95 % PACK Take 1 packet by mouth daily.     VOLTAREN  1 % GEL Apply 1 application topically in the morning and at bedtime. Applied to both knees and lower back     zaleplon  (SONATA ) 10 MG capsule Take 1 capsule (10 mg total) by mouth at bedtime as needed for sleep. 90 capsule 1   fluconazole  (DIFLUCAN ) 150 MG tablet TAKE 1 TABLET, FOLLOWED BY ANOTHER 3 DAYS LATER IF SYMPTOMS ARE NOT IMPROVED.     ondansetron  (ZOFRAN ) 4 MG tablet TAKE ONE TABLET BY MOUTH EVERY 8 HOURS AS NEEDED FOR NAUSEA AND VOMITING     No facility-administered medications prior to visit.    ROS: Review of Systems  Constitutional:  Negative for activity change, appetite change, chills, fatigue and unexpected weight change.  HENT:  Negative for congestion, mouth sores and sinus pressure.   Eyes:  Negative for visual disturbance.  Respiratory:  Negative for cough and chest tightness.   Gastrointestinal:  Negative for abdominal pain and nausea.  Genitourinary:  Negative for difficulty urinating, frequency and vaginal pain.  Musculoskeletal:  Positive for back pain. Negative for gait problem.  Skin:  Negative for pallor and rash.  Neurological:  Negative for dizziness, tremors, weakness, numbness and headaches.  Psychiatric/Behavioral:  Positive for sleep disturbance. Negative for confusion and suicidal ideas.     Objective:  BP 106/70 (BP Location: Right Arm, Patient Position: Sitting, Cuff Size: Normal)   Pulse 77   Temp 98.6 F (37 C) (Oral)   Ht 5' 6 (1.676 m)   SpO2 93%   BMI 19.40 kg/m   BP Readings from Last 3 Encounters:  06/23/23 106/70  12/25/22 110/70  12/03/22 112/72    Wt Readings from Last 3 Encounters:  12/25/22 120 lb 3 oz (54.5 kg)  12/08/22 121 lb  9.6 oz (55.2 kg)  12/03/22 123 lb (55.8 kg)    Physical Exam Constitutional:      General: She is not in acute distress.    Appearance: She is well-developed.  HENT:     Head: Normocephalic.     Right Ear: External ear normal.     Left Ear: External ear normal.     Nose: Nose normal.  Eyes:     General:        Right eye: No discharge.        Left eye: No discharge.     Conjunctiva/sclera: Conjunctivae normal.  Pupils: Pupils are equal, round, and reactive to light.  Neck:     Thyroid : No thyromegaly.     Vascular: No JVD.     Trachea: No tracheal deviation.  Cardiovascular:     Rate and Rhythm: Normal rate and regular rhythm.     Heart sounds: Normal heart sounds.  Pulmonary:     Effort: No respiratory distress.     Breath sounds: No stridor. No wheezing.  Abdominal:     General: Bowel sounds are normal. There is no distension.     Palpations: Abdomen is soft. There is no mass.     Tenderness: There is no abdominal tenderness. There is no guarding or rebound.  Musculoskeletal:        General: Tenderness present.     Cervical back: Normal range of motion and neck supple. No rigidity.  Lymphadenopathy:     Cervical: No cervical adenopathy.  Skin:    Findings: No erythema or rash.  Neurological:     Mental Status: She is oriented to person, place, and time.     Cranial Nerves: No cranial nerve deficit.     Motor: No abnormal muscle tone.     Coordination: Coordination normal.     Deep Tendon Reflexes: Reflexes normal.  Psychiatric:        Behavior: Behavior normal.        Thought Content: Thought content normal.        Judgment: Judgment normal.   LS w/ pain  Lab Results  Component Value Date   WBC 7.2 10/30/2022   HGB 13.4 10/30/2022   HCT 38.1 10/30/2022   PLT 253 10/30/2022   GLUCOSE 94 11/07/2022   CHOL 164 08/26/2021   TRIG 344.0 (H) 08/26/2021   HDL 38.00 (L) 08/26/2021   LDLDIRECT 92.0 08/26/2021   LDLCALC 90 04/21/2018   ALT 4 08/26/2021   AST  15 08/26/2021   NA 138 11/07/2022   K 4.2 11/07/2022   CL 99 11/07/2022   CREATININE 0.89 11/07/2022   BUN 17 11/07/2022   CO2 30 11/07/2022   TSH 1.525 10/30/2022   INR 1.0 09/12/2015   HGBA1C 4.4 (L) 07/30/2006    No results found.  Assessment & Plan:   Problem List Items Addressed This Visit     Fatigue   Treat chronic pain      Chronic low back pain - Primary   Treat chronic pain - Harmonee is seeing Dr Orlando (on MS contin 30 mg bid)  She is planning another LS spine fusion at [facility] - Dr Valere      Vitamin D  deficiency   On Vit D      Stress at home   Better       Dry skin   Use Aquaphor         Meds ordered this encounter  Medications   triamcinolone  cream (KENALOG ) 0.1 %    Sig: Apply 1 Application topically 2 (two) times daily.    Dispense:  80 g    Refill:  3   zaleplon  (SONATA ) 10 MG capsule    Sig: Take 1 capsule (10 mg total) by mouth at bedtime as needed for sleep.    Dispense:  90 capsule    Refill:  1      Follow-up: Return in about 6 months (around 12/21/2023) for a follow-up visit.  Marolyn Noel, MD

## 2023-06-23 NOTE — Patient Instructions (Addendum)
    [facility]

## 2023-06-23 NOTE — Assessment & Plan Note (Addendum)
Treat chronic pain

## 2023-06-29 ENCOUNTER — Other Ambulatory Visit: Payer: Self-pay | Admitting: Orthopedic Surgery

## 2023-06-29 DIAGNOSIS — M4807 Spinal stenosis, lumbosacral region: Secondary | ICD-10-CM

## 2023-07-03 DIAGNOSIS — L853 Xerosis cutis: Secondary | ICD-10-CM | POA: Insufficient documentation

## 2023-07-03 NOTE — Assessment & Plan Note (Signed)
Better

## 2023-07-03 NOTE — Assessment & Plan Note (Signed)
Treat chronic pain - Yan is seeing Dr Vear Clock (on MS contin 30 mg bid)  She is planning another LS spine fusion at [facility] - Dr Peggye Pitt

## 2023-07-03 NOTE — Assessment & Plan Note (Signed)
Use Aquaphor

## 2023-07-06 ENCOUNTER — Other Ambulatory Visit (HOSPITAL_COMMUNITY): Payer: Self-pay

## 2023-07-08 ENCOUNTER — Other Ambulatory Visit (HOSPITAL_COMMUNITY): Payer: Self-pay

## 2023-07-08 MED ORDER — OXYCODONE-ACETAMINOPHEN 10-325 MG PO TABS: 1.0000 | ORAL_TABLET | ORAL | 0 refills | Status: DC | PRN

## 2023-07-11 ENCOUNTER — Other Ambulatory Visit (HOSPITAL_COMMUNITY): Payer: Self-pay

## 2023-07-15 ENCOUNTER — Ambulatory Visit
Admission: RE | Admit: 2023-07-15 | Discharge: 2023-07-15 | Disposition: A | Discharge status: Home / Self Care | Payer: Medicare Other | Source: Ambulatory Visit | Attending: Orthopedic Surgery | Admitting: Orthopedic Surgery

## 2023-07-15 DIAGNOSIS — M4807 Spinal stenosis, lumbosacral region: Secondary | ICD-10-CM

## 2023-07-16 ENCOUNTER — Other Ambulatory Visit: Payer: Self-pay | Admitting: Internal Medicine

## 2023-07-23 ENCOUNTER — Other Ambulatory Visit (HOSPITAL_COMMUNITY): Payer: Self-pay

## 2023-07-23 ENCOUNTER — Other Ambulatory Visit: Payer: Self-pay

## 2023-07-23 MED ORDER — METHOCARBAMOL 500 MG PO TABS
500.0000 mg | ORAL_TABLET | Freq: Three times a day (TID) | ORAL | 1 refills | Status: DC | PRN
  Filled 2023-07-23: qty 90, 30d supply, fill #0

## 2023-07-23 MED ORDER — DOXEPIN HCL 50 MG PO CAPS
50.0000 mg | ORAL_CAPSULE | Freq: Every day | ORAL | 1 refills | Status: DC | PRN
  Filled 2023-07-23: qty 90, 90d supply, fill #0

## 2023-07-23 MED ORDER — OXYCODONE-ACETAMINOPHEN 10-325 MG PO TABS
1.0000 | ORAL_TABLET | ORAL | 0 refills | Status: DC | PRN
Start: 2023-08-05 — End: 2023-10-07

## 2023-08-04 ENCOUNTER — Other Ambulatory Visit (HOSPITAL_COMMUNITY): Payer: Self-pay

## 2023-09-02 ENCOUNTER — Other Ambulatory Visit (HOSPITAL_COMMUNITY): Payer: Self-pay

## 2023-09-02 MED ORDER — OXYCODONE-ACETAMINOPHEN 10-325 MG PO TABS
1.0000 | ORAL_TABLET | ORAL | 0 refills | Status: DC | PRN
  Filled 2023-09-02 – 2023-09-03 (×2): qty 180, 30d supply, fill #0

## 2023-09-02 MED ORDER — DOXEPIN HCL 10 MG PO CAPS
10.0000 mg | ORAL_CAPSULE | Freq: Every day | ORAL | 1 refills | Status: DC | PRN
  Filled 2023-09-02: qty 90, 90d supply, fill #0

## 2023-09-03 ENCOUNTER — Other Ambulatory Visit (HOSPITAL_COMMUNITY): Payer: Self-pay

## 2023-09-29 ENCOUNTER — Other Ambulatory Visit (HOSPITAL_COMMUNITY): Payer: Self-pay

## 2023-09-29 MED ORDER — METHOCARBAMOL 500 MG PO TABS
500.0000 mg | ORAL_TABLET | Freq: Three times a day (TID) | ORAL | 1 refills | Status: DC | PRN
  Filled 2023-09-29: qty 90, 30d supply, fill #0

## 2023-09-29 MED ORDER — DOXEPIN HCL 10 MG PO CAPS
20.0000 mg | ORAL_CAPSULE | Freq: Every day | ORAL | 1 refills | Status: DC | PRN
  Filled 2023-09-29: qty 180, 90d supply, fill #0

## 2023-09-29 MED ORDER — OXYCODONE-ACETAMINOPHEN 10-325 MG PO TABS
1.0000 | ORAL_TABLET | ORAL | 0 refills | Status: DC
  Filled 2023-10-02: qty 180, 30d supply, fill #0

## 2023-10-02 ENCOUNTER — Other Ambulatory Visit (HOSPITAL_COMMUNITY): Payer: Self-pay

## 2023-10-03 ENCOUNTER — Other Ambulatory Visit (HOSPITAL_COMMUNITY): Payer: Self-pay

## 2023-10-06 ENCOUNTER — Telehealth (HOSPITAL_COMMUNITY): Payer: Self-pay

## 2023-10-06 NOTE — Progress Notes (Addendum)
 Advanced [facility] Failure Clinic Note   Patient ID: Rebecca Marks, female   DOB: 09-05-1955, 68 y.o.   MRN: [id number] PCP: Genia Kettering, MD HF Cardiologist: Dr. Mitzie Anda  68 y.o. with history of diastolic CHF exacerbation in setting of iatrogenic volume overload during episode of PNA in 2015. Echo at that time showed normal EF 60-65%, normal RV, no significant valvular abnormalities. She was slow to recover from a dyspnea standpoint after her bout w/ PNA. Had subsequent PFTs that demonstrated only minimal obstructive airways disease (suspected to have some degree of post PNA scaring). Holter monitor (7/15) showed only occasional PACs an no other arrhythmias. She was followed by Dr. Mitzie Anda briefly. Last visit was in 2017 and at that time advised to return PRN.   Follow up 6/24 for an acute visit with LEE and palpitations. 2 week Zio and repeat echo arranged.  BNP mildly elevated and Lasix  20 mg daily started. Echo showed EF 60-65%, grade I DD, normal RV. 2 week Zio showed mostly NSR, rare PACs and PVCs, few short runs of SVT, seem to be symptomatic.   She has graduated from the AHF clinic.  Today she returns for cardiac clearance for spinal surgery at [facility]. Overall feeling fine. Rare dyspnea when laying flat, uses her inhaler and this helps. She is limited walking up steps due to her pain. Walks 1 mile every evening, part of the walk is uphill. 1 episode of palpitations last week, has not used PRN Toprol . Denies CP, dizziness, edema, or PND. She is a retired Technical brewer. Rarely takes lasix .   ECG (personally reviewed):  NSR  Labs (3/15): TSH normal Labs (4/15): BNP 1147 => 454  Labs (9/17): LDL 80, HDL 37, K 4.3, creatinine 8.65 Labs (1/24): LDL 92 Labs (6/24): K 4.2, creatinine 0.89 Labs (4/25): K 4.5, creatinine 0.72  PMH: 1. Osteoarthritis 2. Raynauds phenomenon 3. HCV: Spontaneously cleared.  4. H/o DVT in her 65s while on OCPs.  5. PNA (metapneumovirus) in 4/15 6. Echo  (4/15) with EF 60-65%, normal RV, no significant valvular abnormalities.  - Echo (6/24): EF 60-65%, grade I DD, normal RV 7. PFTs (7/15) with minimal obstructive airways disease 8. PACs: Holter (7/15) with occasional PACs.  - Zio 2 week (7/24): rare PACs/PVCs, short runs of SVT, symptomatic  SH: Married, prior smoking, occasional ETOH.   FH: Father with MI x 3, earliest in his 62s.   ROS: All systems reviewed and negative except as per HPI.   Current Outpatient Medications  Medication Sig Dispense Refill   albuterol  (VENTOLIN  HFA) 108 (90 Base) MCG/ACT inhaler TAKE 2 PUFFS BY MOUTH EVERY 6 HOURS AS NEEDED FOR WHEEZE OR SHORTNESS OF BREATH 18 each 3   Cholecalciferol (VITAMIN D3) 125 MCG (5000 UT) CAPS Take 10,000 Units by mouth every evening.     CORAL CALCIUM PO Take 1 Dose by mouth. Ionic Calcium (Mixed in bottle of water--sand formulation)     diclofenac  (VOLTAREN ) 75 MG EC tablet TAKE 1 TABLET BY MOUTH TWICE A DAY 60 tablet 3   doxepin  (SINEQUAN ) 10 MG capsule TAKE 1 CAPSULE BY MOUTH EVERY DAY AT BEDTIME AS NEEDED 90 capsule 1   famotidine  (PEPCID ) 40 MG tablet TAKE 1 TABLET BY MOUTH EVERY DAY 90 tablet 3   fluconazole  (DIFLUCAN ) 150 MG tablet As needed     furosemide  (LASIX ) 20 MG tablet Take 1 tablet (20 mg total) by mouth daily. (Patient taking differently: Take 20 mg by mouth daily as needed.)  30 tablet 5   magnesium hydroxide (MILK OF MAGNESIA) 400 MG/5ML suspension Take 15 mLs by mouth daily as needed for mild constipation.     metoprolol  succinate (TOPROL  XL) 25 MG 24 hr tablet Take one tablet daily; may start taking as needed 30 tablet 11   naloxone  (NARCAN ) nasal spray 4 mg/0.1 mL 1 actuation in one nostril once. May repeat in 2-3 min 1 each 2   ondansetron  (ZOFRAN ) 4 MG tablet TAKE ONE TABLET BY MOUTH EVERY 8 HOURS AS NEEDED FOR NAUSEA AND VOMITING     oxyCODONE -acetaminophen  (PERCOCET) 10-325 MG tablet Take 1 tablet by mouth 4 times daily as needed  for pain 120 tablet 0    PHOSPHATIDYLSERINE PO Take 300 mg by mouth at bedtime.     psyllium (HYDROCIL/METAMUCIL) 95 % PACK Take 1 packet by mouth daily.     triamcinolone  cream (KENALOG ) 0.1 % Apply 1 Application topically 2 (two) times daily. 80 g 3   VOLTAREN  1 % GEL Apply 1 application topically in the morning and at bedtime. Applied to both knees and lower back     zaleplon  (SONATA ) 10 MG capsule Take 1 capsule (10 mg total) by mouth at bedtime as needed for sleep. 90 capsule 1   Ascorbic Acid  (VITAMIN C) 1000 MG tablet Take 2,000 mg by mouth 2 (two) times daily as needed (immune system support). (Patient not taking: Reported on 10/07/2023)     oxyCODONE -acetaminophen  (PERCOCET) 10-325 MG tablet Take 1 tablet by mouth 4 (four) times daily as needed for pain (fill 02/05/23) 120 tablet 0   oxyCODONE -acetaminophen  (PERCOCET) 10-325 MG tablet Take 1 tablet by mouth every 4 (four) hours as needed for pain (stop fentanyl ) 90 tablet 0   oxyCODONE -acetaminophen  (PERCOCET) 10-325 MG tablet Take 1 tablet by mouth every 4 (four) hours as needed for pain 180 tablet 0   oxyCODONE -acetaminophen  (PERCOCET) 10-325 MG tablet Take 1 tablet by mouth every 4 (four) hours as needed for pain 180 tablet 0   oxyCODONE -acetaminophen  (PERCOCET) 10-325 MG tablet Take 1 tablet by mouth every 4 (four) hours as needed for pain. 180 tablet 0   oxyCODONE -acetaminophen  (PERCOCET) 10-325 MG tablet Take 1 tablet by mouth every four hours as needed for pain 180 tablet 0   oxyCODONE -acetaminophen  (PERCOCET) 10-325 MG tablet Take 1 tablet by mouth every 4 (four) hours as needed for pain 180 tablet 0   oxyCODONE -acetaminophen  (PERCOCET) 10-325 MG tablet Take 1 tablet by mouth every 4 (four) hours as needed. 180 tablet 0   oxyCODONE -acetaminophen  (PERCOCET) 10-325 MG tablet Take 1 tablet by mouth every 4 (four) hours as needed for pain 180 tablet 0   oxyCODONE -acetaminophen  (PERCOCET) 10-325 MG tablet Take 1 tablet by mouth every 4 (four) hours as needed for  pain 180 tablet 0   oxyCODONE -acetaminophen  (PERCOCET) 10-325 MG tablet Take 1 tablet by mouth every 4 (four) hours as needed for pain. 180 tablet 0   No current facility-administered medications for this encounter.   BP 118/80   Pulse 78   Wt 61 kg (134 lb 6.4 oz)   SpO2 98%   BMI 21.69 kg/m   Wt Readings from Last 3 Encounters:  10/07/23 61 kg (134 lb 6.4 oz)  12/25/22 54.5 kg (120 lb 3 oz)  12/08/22 55.2 kg (121 lb 9.6 oz)   Physical Exam General:  NAD. No resp difficulty, walked into clinic with cane HEENT: Normal Neck: Supple. No JVD. Cor: Regular rate & rhythm. No rubs, gallops or murmurs. Lungs: Clear  Abdomen: Soft, nontender, nondistended.  Extremities: No cyanosis, clubbing, rash, edema Neuro: Alert & oriented x 3, moves all 4 extremities w/o difficulty. Affect pleasant.  Assessment/Plan Hx of diastolic dysfunction: - Developed iatrogenic volume overload due to a large amount of IV fluid with elevated BNP during hospitalization in 2015 for PNA. Echo was normal EF 60-65%, normal RV, no significant valvular abnormalities. Suspected to have some diastolic dysfunction. She has done fairly well since that time, but developed LEE and palpitations 10/2022, post COVID infection. Echo 6/24 showed EF 60-65%, grade I DD, normal RV. Today, she is not volume overloaded and NYHA II. - Continue Lasix  20 mg PRN. - Recent labs reviewed from [facility] 08/29/23 and are stable  2. Palpitations: - 2 week Zio showed mostly NSR with rare PACs/PVCs, but few runs of SVT, symptomatic. - Echo 6/24 with stable EF 60-65% - Suspect 2/2 to post-COVID and recent life stressors. - Continue Toprol  XL 25 mg PRN  3. Pre-Operative CV risk stratification - She is low CV risk for surgery. OK to proceed from cardiac standpoint - Dr. Wadie Guile at [facility] in [place]  She has graduated from the AHF clinic.   Arlice Bene Shelburn, FNP-BC  10/07/23

## 2023-10-06 NOTE — Telephone Encounter (Signed)
 Called to confirm/remind patient of their appointment at the [facility] on 10/07/23.   Appointment:   [x] Confirmed  [] Left mess   [] No answer/No voice mail  [] VM Full/unable to leave message  [] Phone not in service  Patient reminded to bring all medications and/or complete list.  Confirmed patient has transportation. Gave directions, instructed to utilize valet parking.

## 2023-10-07 ENCOUNTER — Encounter (HOSPITAL_COMMUNITY): Payer: Self-pay

## 2023-10-07 ENCOUNTER — Ambulatory Visit (HOSPITAL_COMMUNITY)
Admission: RE | Admit: 2023-10-07 | Discharge: 2023-10-07 | Disposition: A | Discharge status: Home / Self Care | Source: Ambulatory Visit | Attending: Family Medicine | Admitting: Family Medicine

## 2023-10-07 VITALS — BP 118/80 | HR 78 | Wt 134.4 lb

## 2023-10-07 DIAGNOSIS — R002 Palpitations: Secondary | ICD-10-CM | POA: Diagnosis not present

## 2023-10-07 DIAGNOSIS — I5030 Unspecified diastolic (congestive) heart failure: Secondary | ICD-10-CM | POA: Diagnosis not present

## 2023-10-07 DIAGNOSIS — Z01818 Encounter for other preprocedural examination: Secondary | ICD-10-CM | POA: Diagnosis present

## 2023-10-07 DIAGNOSIS — Z01810 Encounter for preprocedural cardiovascular examination: Secondary | ICD-10-CM | POA: Insufficient documentation

## 2023-10-07 DIAGNOSIS — Z87891 Personal history of nicotine dependence: Secondary | ICD-10-CM | POA: Diagnosis not present

## 2023-10-07 NOTE — Addendum Note (Signed)
 Encounter addended by: Elmarie Hacking, FNP on: 10/07/2023 4:02 PM  Actions taken: Clinical Note Signed

## 2023-10-07 NOTE — Patient Instructions (Addendum)
 Good to see you today!  No medication changes  Congratulations! You have graduated from the clinic Great news!  Follow up with your primary care as directed

## 2023-10-09 ENCOUNTER — Ambulatory Visit (HOSPITAL_COMMUNITY): Payer: Self-pay | Admitting: Family Medicine

## 2023-10-29 ENCOUNTER — Other Ambulatory Visit (HOSPITAL_COMMUNITY): Payer: Self-pay

## 2023-10-29 MED ORDER — OXYCODONE-ACETAMINOPHEN 10-325 MG PO TABS
1.0000 | ORAL_TABLET | ORAL | 0 refills | Status: DC | PRN
  Filled 2023-10-29 – 2023-10-31 (×3): qty 180, 30d supply, fill #0

## 2023-10-30 ENCOUNTER — Other Ambulatory Visit (HOSPITAL_COMMUNITY): Payer: Self-pay

## 2023-10-31 ENCOUNTER — Other Ambulatory Visit (HOSPITAL_COMMUNITY): Payer: Self-pay

## 2023-11-16 ENCOUNTER — Other Ambulatory Visit (HOSPITAL_COMMUNITY): Payer: Self-pay

## 2023-11-16 MED ORDER — FAMOTIDINE 40 MG PO TABS
40.0000 mg | ORAL_TABLET | Freq: Every day | ORAL | 3 refills | Status: AC
  Filled 2023-11-16: qty 30, 30d supply, fill #0
  Filled 2023-12-15: qty 30, 30d supply, fill #1

## 2023-11-16 MED ORDER — DICLOFENAC SODIUM 75 MG PO TBEC
75.0000 mg | DELAYED_RELEASE_TABLET | Freq: Two times a day (BID) | ORAL | 2 refills | Status: AC
  Filled 2023-11-16: qty 60, 30d supply, fill #0
  Filled 2023-12-15: qty 60, 30d supply, fill #1

## 2023-12-01 ENCOUNTER — Other Ambulatory Visit (HOSPITAL_COMMUNITY): Payer: Self-pay

## 2023-12-01 MED ORDER — DOXEPIN HCL 10 MG PO CAPS
20.0000 mg | ORAL_CAPSULE | Freq: Every day | ORAL | 1 refills | Status: AC | PRN
  Filled 2023-12-01: qty 180, 90d supply, fill #0

## 2023-12-01 MED ORDER — OXYCODONE-ACETAMINOPHEN 10-325 MG PO TABS
1.0000 | ORAL_TABLET | ORAL | 0 refills | Status: AC | PRN
  Filled 2023-12-01: qty 180, 30d supply, fill #0

## 2023-12-01 MED ORDER — METHOCARBAMOL 500 MG PO TABS
500.0000 mg | ORAL_TABLET | Freq: Three times a day (TID) | ORAL | 1 refills | Status: AC | PRN
  Filled 2023-12-01: qty 90, 30d supply, fill #0

## 2023-12-10 ENCOUNTER — Ambulatory Visit: Payer: Medicare Other

## 2023-12-10 ENCOUNTER — Ambulatory Visit

## 2023-12-10 VITALS — Ht 66.0 in | Wt 132.0 lb

## 2023-12-10 DIAGNOSIS — K635 Polyp of colon: Secondary | ICD-10-CM

## 2023-12-10 DIAGNOSIS — Z0000 Encounter for general adult medical examination without abnormal findings: Secondary | ICD-10-CM | POA: Diagnosis not present

## 2023-12-10 NOTE — Progress Notes (Addendum)
 Subjective:   Rebecca Marks is a 68 y.o. who presents for a Medicare Wellness preventive visit.  As a reminder, Annual Wellness Visits don't include a physical exam, and some assessments may be limited, especially if this visit is performed virtually. We may recommend an in-person follow-up visit with your provider if needed.  Visit Complete: Virtual I connected with  Rebecca Marks on 12/10/23 by a audio enabled telemedicine application and verified that I am speaking with the correct person using two identifiers.  Patient Location: Home  Provider Location: Office/Clinic  I discussed the limitations of evaluation and management by telemedicine. The patient expressed understanding and agreed to proceed.  Vital Signs: Because this visit was a virtual/telehealth visit, some criteria may be missing or patient reported. Any vitals not documented were not able to be obtained and vitals that have been documented are patient reported.  VideoDeclined- This patient declined Librarian, academic. Therefore the visit was completed with audio only.  Persons Participating in Visit: Patient.  AWV Questionnaire: No: Patient Medicare AWV questionnaire was not completed prior to this visit.  Cardiac Risk Factors include: advanced age (>74men, >42 women)     Objective:    Today's Vitals   12/10/23 1417 12/10/23 1422  Weight: 132 lb (59.9 kg)   Height: 5' 6 (1.676 m)   PainSc:  8   PainLoc: Back    Body mass index is 21.31 kg/m.     12/10/2023    2:12 PM 12/08/2022    2:41 PM 04/02/2022    1:19 PM 11/20/2020    6:00 AM 11/14/2020    1:27 PM 02/08/2015    7:16 AM 09/03/2013   12:58 AM  Advanced Directives  Does Patient Have a Medical Advance Directive? Yes Yes Yes Yes Yes Yes  Patient has advance directive, copy not in chart   Type of Advance Directive Healthcare Power of [place];Living will Healthcare Power of [place];Living will Healthcare Power of  [place];Living will Healthcare Power of [place];Living will Healthcare Power of [place];Living will Healthcare Power of [place];Living will  Healthcare Power of [place];Living will   Does patient want to make changes to medical advance directive? No - Patient declined No - Patient declined No - Patient declined    No change requested   Copy of Healthcare Power of Attorney in Chart? Yes - validated most recent copy scanned in chart (See row information) Yes - validated most recent copy scanned in chart (See row information) Yes - validated most recent copy scanned in chart (See row information) No - copy requested   Copy requested from family   Pre-existing out of facility DNR order (yellow form or pink MOST form)       No      Data saved with a previous flowsheet row definition    Current Medications (verified) Outpatient Encounter Medications as of 12/10/2023  Medication Sig   albuterol  (VENTOLIN  HFA) 108 (90 Base) MCG/ACT inhaler TAKE 2 PUFFS BY MOUTH EVERY 6 HOURS AS NEEDED FOR WHEEZE OR SHORTNESS OF BREATH   Ascorbic Acid  (VITAMIN C) 1000 MG tablet Take 2,000 mg by mouth 2 (two) times daily as needed (immune system support).   Cholecalciferol (VITAMIN D3) 125 MCG (5000 UT) CAPS Take 10,000 Units by mouth every evening.   CORAL CALCIUM PO Take 1 Dose by mouth. Ionic Calcium (Mixed in bottle of water--sand formulation)   diclofenac  (VOLTAREN ) 75 MG EC tablet TAKE 1 TABLET BY MOUTH TWICE A DAY   diclofenac  (  VOLTAREN ) 75 MG EC tablet Take 1 tablet (75 mg total) by mouth 2 (two) times daily.   doxepin  (SINEQUAN ) 10 MG capsule TAKE 1 CAPSULE BY MOUTH EVERY DAY AT BEDTIME AS NEEDED   doxepin  (SINEQUAN ) 10 MG capsule Take 2 capsules (20 mg total) by mouth daily as needed.   famotidine  (PEPCID ) 40 MG tablet TAKE 1 TABLET BY MOUTH EVERY DAY   famotidine  (PEPCID ) 40 MG tablet Take 1 tablet (40 mg total) by mouth daily.   fluconazole  (DIFLUCAN ) 150 MG tablet As needed   furosemide  (LASIX ) 20 MG  tablet Take 1 tablet (20 mg total) by mouth daily.   magnesium hydroxide (MILK OF MAGNESIA) 400 MG/5ML suspension Take 15 mLs by mouth daily as needed for mild constipation.   methocarbamol  (ROBAXIN ) 500 MG tablet Take 1 tablet (500 mg total) by mouth 3 (three) times daily as needed.   metoprolol  succinate (TOPROL  XL) 25 MG 24 hr tablet Take one tablet daily; may start taking as needed   naloxone  (NARCAN ) nasal spray 4 mg/0.1 mL 1 actuation in one nostril once. May repeat in 2-3 min   ondansetron  (ZOFRAN ) 4 MG tablet TAKE ONE TABLET BY MOUTH EVERY 8 HOURS AS NEEDED FOR NAUSEA AND VOMITING   oxyCODONE -acetaminophen  (PERCOCET) 10-325 MG tablet Take 1 tablet by mouth 4 times daily as needed  for pain   oxyCODONE -acetaminophen  (PERCOCET) 10-325 MG tablet Take 1 tablet by mouth every 4 (four) hours as needed.   PHOSPHATIDYLSERINE PO Take 300 mg by mouth at bedtime.   psyllium (HYDROCIL/METAMUCIL) 95 % PACK Take 1 packet by mouth daily.   triamcinolone  cream (KENALOG ) 0.1 % Apply 1 Application topically 2 (two) times daily.   VOLTAREN  1 % GEL Apply 1 application topically in the morning and at bedtime. Applied to both knees and lower back   zaleplon  (SONATA ) 10 MG capsule Take 1 capsule (10 mg total) by mouth at bedtime as needed for sleep.   No facility-administered encounter medications on file as of 12/10/2023.    Allergies (verified) Cymbalta  [duloxetine  hcl], Risperidone, Ritalin  [methylphenidate  hcl], and Venlafaxine    History: Past Medical History:  Diagnosis Date   Allergy    Anxiety    Arthritis    R wrist and R knee   CAP (community acquired pneumonia) 4/17-22/15   Rochephin & IV Zithromax  & 3 days Levaquin    Chronic back pain    Clotting disorder (HCC)    DVT (deep venous thrombosis) (HCC)    a. in her 20s while on OCPs.   Dyspnea    GERD (gastroesophageal reflux disease)    Hepatitis C    contaminated blood @ her job, prior notes indicate spontaneously cleared   History of  tobacco abuse    Osteoarthritis    Palpitations    Premature atrial contractions    a. Holter 11/2013: few PACs.   Pulmonary function study abnormality    a. 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   Raynaud phenomenon    Ruptured lumbar disc    4 herniated disc    Sleep apnea    Volume overload    a.  history of volume overload during hospital stay for PNA in 08/2013 felt iatrogenic due to receiving a lot of IV fluid. 2D echo 08/2013 showed EF 60-65%, normal diastolic function parameters.   Past Surgical History:  Procedure Laterality Date   CHOLECYSTECTOMY  1985   lumbar stem cell   2020   Family History  Problem Relation Age of Onset  Diabetes Father    Hypertension Father    Heart attack Father        >55   Lymphoma Father    Heart disease Father    Stroke Mother 69       after bedridden post fall with BLE paralysis   Breast cancer Mother         X 3   Breast cancer Sister    Alzheimer's disease Maternal Grandmother    Stroke Maternal Grandfather        in 89s   Colon cancer Neg Hx    Social History   Socioeconomic History   Marital status: Married    Spouse name: Not on file   Number of children: Not on file   Years of education: Not on file   Highest education level: Not on file  Occupational History   Not on file  Tobacco Use   Smoking status: Former    Current packs/day: 0.00    Average packs/day: 0.5 packs/day for 25.0 years (12.5 ttl pk-yrs)    Types: Cigarettes    Start date: 08/19/1988    Quit date: 08/19/2013    Years since quitting: 10.3   Smokeless tobacco: Never   Tobacco comments:    smoked 1979-2008, up to 1 ppd. As of 08/01/13 1 cig every 2 weeks  Vaping Use   Vaping status: Never Used  Substance and Sexual Activity   Alcohol use: Yes    Alcohol/week: 0.0 standard drinks of alcohol    Comment: 1 Beer nightly   Drug use: No    Comment: History-long time ago   Sexual activity: Yes    Birth control/protection: None  Other  Topics Concern   Not on file  Social History Narrative   She lives with husband. She does not have children.   She is a retired Engineer, civil (consulting).    She lives in 3 story home.    Social Drivers of Corporate investment banker Strain: Low Risk  (12/10/2023)   Overall Financial Resource Strain (CARDIA)    Difficulty of Paying Living Expenses: Not very hard  Food Insecurity: No Food Insecurity (12/10/2023)   Hunger Vital Sign    Worried About Running Out of Food in the Last Year: Never true    Ran Out of Food in the Last Year: Never true  Transportation Needs: No Transportation Needs (12/10/2023)   PRAPARE - Administrator, Civil Service (Medical): No    Lack of Transportation (Non-Medical): No  Physical Activity: Insufficiently Active (12/10/2023)   Exercise Vital Sign    Days of Exercise per Week: 5 days    Minutes of Exercise per Session: 20 min  Stress: Stress Concern Present (12/10/2023)   [employer] of Occupational Health - Occupational Stress Questionnaire    Feeling of Stress: To some extent  Social Connections: Moderately Integrated (12/10/2023)   Social Connection and Isolation Panel    Frequency of Communication with Friends and Family: More than three times a week    Frequency of Social Gatherings with Friends and Family: Once a week    Attends Religious Services: Never    Database administrator or Organizations: No    Attends Engineer, structural: 1 to 4 times per year    Marital Status: Married    Tobacco Counseling Counseling given: Not Answered Tobacco comments: smoked 1979-2008, up to 1 ppd. As of 08/01/13 1 cig every 2 weeks    Clinical Intake:  Pre-visit preparation  completed: Yes  Pain : 0-10 Pain Score: 8  Pain Type: Acute pain Pain Location: Back Pain Orientation: Lower Pain Descriptors / Indicators: Aching Pain Onset: More than a month ago Pain Frequency: Intermittent Pain Relieving Factors: Percocet  Pain Relieving Factors:  Percocet  BMI - recorded: 21.31 Nutritional Status: BMI of 19-24  Normal Nutritional Risks: None Diabetes: No  Lab Results  Component Value Date   HGBA1C 4.4 (L) 07/30/2006     How often do you need to have someone help you when you read instructions, pamphlets, or other written materials from your doctor or pharmacy?: 1 - Never  Interpreter Needed?: No  Information entered by :: Verdie Saba, CMA   Activities of Daily Living     12/10/2023    2:24 PM  In your present state of health, do you have any difficulty performing the following activities:  Hearing? 0  Vision? 0  Difficulty concentrating or making decisions? 0  Walking or climbing stairs? 0  Dressing or bathing? 0  Doing errands, shopping? 0  Preparing Food and eating ? N  Using the Toilet? N  In the past six months, have you accidently leaked urine? N  Do you have problems with loss of bowel control? N  Managing your Medications? N  Managing your Finances? N  Housekeeping or managing your Housekeeping? N    Patient Care Team: Plotnikov, Karlynn GAILS, MD as PCP - General (Internal Medicine) Patel, Donika K, DO as Consulting Physician (Neurology) Orlando Anes, MD (Anesthesiology) Glendia Simmonds, OD as Referring Physician (Optometry)  I have updated your Care Teams any recent Medical Services you may have received from other providers in the past year.     Assessment:   This is a routine wellness examination for [facility].  Hearing/Vision screen Hearing Screening - Comments:: Denies hearing difficulties   Vision Screening - Comments:: Wears rx glasses - up to date with routine eye exams with Simmonds Glendia   Goals Addressed               This Visit's Progress     Patient Stated (pt-stated)        Patient stated she plans to manage her back pain       Depression Screen     12/10/2023    2:29 PM 12/08/2022    2:52 PM 08/25/2022    2:36 PM 05/26/2022    2:12 PM 04/21/2022    3:07 PM 09/04/2021    1:53 PM  08/11/2019    3:04 PM  PHQ 2/9 Scores  PHQ - 2 Score 0 1 0 0 0 0 0  PHQ- 9 Score 2 1         Fall Risk     12/10/2023    2:25 PM 12/08/2022    2:42 PM 08/25/2022    2:35 PM 05/26/2022    2:12 PM 04/21/2022    3:07 PM  Fall Risk   Falls in the past year? 1 1 1  0 0  Comment  last 6 months-per patient     Number falls in past yr: 0 0 0 0 0  Comment 1      Injury with Fall? 0 0 0 0 0  Risk for fall due to :  Medication side effect No Fall Risks No Fall Risks No Fall Risks  Follow up Falls evaluation completed;Falls prevention discussed  Falls evaluation completed Falls evaluation completed  Falls evaluation completed      Data saved with a previous flowsheet  row definition    MEDICARE RISK AT HOME:  Medicare Risk at Home Any stairs in or around the home?: Yes (inside/outside) If so, are there any without handrails?: No Home free of loose throw rugs in walkways, pet beds, electrical cords, etc?: Yes Adequate lighting in your home to reduce risk of falls?: Yes Life alert?: No Use of a cane, walker or w/c?: No Grab bars in the bathroom?: No Shower chair or bench in shower?: No Elevated toilet seat or a handicapped toilet?: Yes  TIMED UP AND GO:  Was the test performed?  No  Cognitive Function: 6CIT completed        12/10/2023    2:42 PM 12/08/2022    2:44 PM  6CIT Screen  What Year? 0 points 0 points  What month? 0 points 0 points  What time? 0 points 0 points  Count back from 20 0 points 0 points  Months in reverse 0 points 0 points  Repeat phrase 0 points 0 points  Total Score 0 points 0 points    Immunizations Immunization History  Administered Date(s) Administered   Fluad Quad(high Dose 65+) 04/02/2022   Hepatitis B 04/19/2010, 05/24/2010   Influenza Inj Mdck Quad Pf 02/27/2018, 02/02/2019, 01/18/2020   Influenza Whole 04/19/2010   Influenza, Seasonal, Injecte, Preservative Fre 04/06/2017   Influenza,inj,Quad PF,6+ Mos 04/07/2014, 05/02/2015, 02/08/2016,  02/02/2019   PFIZER Comirnaty(Gray Top)Covid-19 Tri-Sucrose Vaccine 06/15/2020   PFIZER(Purple Top)SARS-COV-2 Vaccination 08/05/2019, 08/18/2019, 08/30/2019, 01/27/2020   Pfizer Covid-19 Vaccine Bivalent Booster 43yrs & up 02/04/2021, 09/12/2021, 04/02/2022   Pneumococcal Conjugate-13 09/11/2015   Rsv, Mab, Nirsevimab-alip, 1 Ml, Neonate To 24 Mos(Beyfortus) 04/02/2022   Td 05/19/2000, 08/02/2013   Zoster Recombinant(Shingrix ) 04/21/2018, 07/08/2018    Screening Tests Health Maintenance  Topic Date Due   Hepatitis B Vaccines (3 of 3 - Risk 3-dose series) 10/19/2010   Pneumococcal Vaccine: 50+ Years (2 of 2 - PPSV23, PCV20, or PCV21) 11/06/2015   Colonoscopy  02/08/2020   COVID-19 Vaccine (9 - 2024-25 season) 01/18/2023   DTaP/Tdap/Td (3 - Tdap) 08/03/2023   INFLUENZA VACCINE  12/18/2023   MAMMOGRAM  11/26/2024   Medicare Annual Wellness (AWV)  12/09/2024   DEXA SCAN  Completed   Hepatitis C Screening  Completed   Zoster Vaccines- Shingrix   Completed   HPV VACCINES  Aged Out   Meningococcal B Vaccine  Aged Out    Health Maintenance  Health Maintenance Due  Topic Date Due   Hepatitis B Vaccines (3 of 3 - Risk 3-dose series) 10/19/2010   Pneumococcal Vaccine: 50+ Years (2 of 2 - PPSV23, PCV20, or PCV21) 11/06/2015   Colonoscopy  02/08/2020   COVID-19 Vaccine (9 - 2024-25 season) 01/18/2023   DTaP/Tdap/Td (3 - Tdap) 08/03/2023   Health Maintenance Items Addressed:  Referral sent to GI for colonoscopy w/Dr Elspeth Naval of [facility].  Additional Screening:  Vision Screening: Recommended annual ophthalmology exams for early detection of glaucoma and other disorders of the eye. Would you like a referral to an eye doctor? No    Dental Screening: Recommended annual dental exams for proper oral hygiene  Community Resource Referral / Chronic Care Management: CRR required this visit?  No   CCM required this visit?  No   Plan:    I have personally reviewed and noted  the following in the patient's chart:   Medical and social history Use of alcohol, tobacco or illicit drugs  Current medications and supplements including opioid prescriptions. Patient is currently taking opioid prescriptions.  Functional ability and status Nutritional status Physical activity Advanced directives List of other physicians Hospitalizations, surgeries, and ER visits in previous 12 months Vitals Screenings to include cognitive, depression, and falls Referrals and appointments  In addition, I have reviewed and discussed with patient certain preventive protocols, quality metrics, and best practice recommendations. A written personalized care plan for preventive services as well as general preventive health recommendations were provided to patient.   Verdie CHRISTELLA Saba, CMA   12/10/2023   After Visit Summary: (MyChart) Due to this being a telephonic visit, the after visit summary with patients personalized plan was offered to patient via MyChart   Notes: Nothing significant to report at this time.  Medical screening examination/treatment/procedure(s) were performed by non-physician practitioner and as supervising physician I was immediately available for consultation/collaboration.  I agree with above. Karlynn Noel, MD

## 2023-12-10 NOTE — Patient Instructions (Addendum)
 Ms. Rebecca Marks , Thank you for taking time out of your busy schedule to complete your Annual Wellness Visit with me. I enjoyed our conversation and look forward to speaking with you again next year. I, as well as your care team,  appreciate your ongoing commitment to your health goals. Please review the following plan we discussed and let me know if I can assist you in the future. Your Game plan/ To Do List    Referrals: If you haven't heard from the office you've been referred to, please reach out to them at the phone provided.  Referral to [facility] for a repeat Colonoscopy Follow up Visits: Next Medicare AWV with our clinical staff: 12/15/2024 - in the office   Have you seen your provider in the last 6 months (3 months if uncontrolled diabetes)? Yes Next Office Visit with your provider: to be scheduled by the patient  Clinician Recommendations:  Aim for 30 minutes of exercise or brisk walking, 6-8 glasses of water, and 5 servings of fruits and vegetables each day. Educated and advised on getting the Pneumonia, 3rd Hepatitis B, Tdap, and the COVID vaccines in 2025.      This is a list of the screening recommended for you and due dates:  Health Maintenance  Topic Date Due   Hepatitis B Vaccine (3 of 3 - Risk 3-dose series) 10/19/2010   Pneumococcal Vaccine for age over 107 (2 of 2 - PPSV23, PCV20, or PCV21) 11/06/2015   Colon Cancer Screening  02/08/2020   COVID-19 Vaccine (9 - 2024-25 season) 01/18/2023   DTaP/Tdap/Td vaccine (3 - Tdap) 08/03/2023   Flu Shot  12/18/2023   Mammogram  11/26/2024   Medicare Annual Wellness Visit  12/09/2024   DEXA scan (bone density measurement)  Completed   Hepatitis C Screening  Completed   Zoster (Shingles) Vaccine  Completed   HPV Vaccine  Aged Out   Meningitis B Vaccine  Aged Out    Advanced directives: (In Chart) A copy of your advanced directives are scanned into your chart should your provider ever need it. Advance Care Planning is important  because it:  [x]  Makes sure you receive the medical care that is consistent with your values, goals, and preferences  [x]  It provides guidance to your family and loved ones and reduces their decisional burden about whether or not they are making the right decisions based on your wishes.  Follow the link provided in your after visit summary or read over the paperwork we have mailed to you to help you started getting your Advance Directives in place. If you need assistance in completing these, please reach out to us  so that we can help you!   Managing Pain Without Opioids Opioids are strong medicines used to treat moderate to severe pain. For some people, especially those who have long-term (chronic) pain, opioids may not be the best choice for pain management due to: Side effects like nausea, constipation, and sleepiness. The risk of addiction (opioid use disorder). The longer you take opioids, the greater your risk of addiction. Pain that lasts for more than 3 months is called chronic pain. Managing chronic pain usually requires more than one approach and is often provided by a team of health care providers working together (multidisciplinary approach). Pain management may be done at a pain management center or pain clinic. How to manage pain without the use of opioids Use non-opioid medicines Non-opioid medicines for pain may include: Over-the-counter or prescription non-steroidal anti-inflammatory drugs (NSAIDs). These  may be the first medicines used for pain. They work well for muscle and bone pain, and they reduce swelling. Acetaminophen . This over-the-counter medicine may work well for milder pain but not swelling. Antidepressants. These may be used to treat chronic pain. A certain type of antidepressant (tricyclics) is often used. These medicines are given in lower doses for pain than when used for depression. Anticonvulsants. These are usually used to treat seizures but may also reduce  nerve (neuropathic) pain. Muscle relaxants. These relieve pain caused by sudden muscle tightening (spasms). You may also use a pain medicine that is applied to the skin as a patch, cream, or gel (topical analgesic), such as a numbing medicine. These may cause fewer side effects than medicines taken by mouth. Do certain therapies as directed Some therapies can help with pain management. They include: Physical therapy. You will do exercises to gain strength and flexibility. A physical therapist may teach you exercises to move and stretch parts of your body that are weak, stiff, or painful. You can learn these exercises at physical therapy visits and practice them at home. Physical therapy may also involve: Massage. Heat wraps or applying heat or cold to affected areas. Electrical signals that interrupt pain signals (transcutaneous electrical nerve stimulation, TENS). Weak lasers that reduce pain and swelling (low-level laser therapy). Signals from your body that help you learn to regulate pain (biofeedback). Occupational therapy. This helps you to learn ways to function at home and work with less pain. Recreational therapy. This involves trying new activities or hobbies, such as a physical activity or drawing. Mental health therapy, including: Cognitive behavioral therapy (CBT). This helps you learn coping skills for dealing with pain. Acceptance and commitment therapy (ACT) to change the way you think and react to pain. Relaxation therapies, including muscle relaxation exercises and mindfulness-based stress reduction. Pain management counseling. This may be individual, family, or group counseling.  Receive medical treatments Medical treatments for pain management include: Nerve block injections. These may include a pain blocker and anti-inflammatory medicines. You may have injections: Near the spine to relieve chronic back or neck pain. Into joints to relieve back or joint pain. Into nerve  areas that supply a painful area to relieve body pain. Into muscles (trigger point injections) to relieve some painful muscle conditions. A medical device placed near your spine to help block pain signals and relieve nerve pain or chronic back pain (spinal cord stimulation device). Acupuncture. Follow these instructions at home Medicines Take over-the-counter and prescription medicines only as told by your health care provider. If you are taking pain medicine, ask your health care providers about possible side effects to watch out for. Do not drive or use heavy machinery while taking prescription opioid pain medicine. Lifestyle  Do not use drugs or alcohol to reduce pain. If you drink alcohol, limit how much you have to: 0-1 drink a day for women who are not pregnant. 0-2 drinks a day for men. Know how much alcohol is in a drink. In the [place] one drink equals one 12 oz bottle of beer (355 mL), one 5 oz glass of wine (148 mL), or one 1 oz glass of hard liquor (44 mL). Do not use any products that contain nicotine or tobacco. These products include cigarettes, chewing tobacco, and vaping devices, such as e-cigarettes. If you need help quitting, ask your health care provider. Eat a healthy diet and maintain a healthy weight. Poor diet and excess weight may make pain worse. Eat foods that are  high in fiber. These include fresh fruits and vegetables, whole grains, and beans. Limit foods that are high in fat and processed sugars, such as fried and sweet foods. Exercise regularly. Exercise lowers stress and may help relieve pain. Ask your health care provider what activities and exercises are safe for you. If your health care provider approves, join an exercise class that combines movement and stress reduction. Examples include yoga and tai chi. Get enough sleep. Lack of sleep may make pain worse. Lower stress as much as possible. Practice stress reduction techniques as told by your  therapist. General instructions Work with all your pain management providers to find the treatments that work best for you. You are an important member of your pain management team. There are many things you can do to reduce pain on your own. Consider joining an online or in-person support group for people who have chronic pain. Keep all follow-up visits. This is important. Where to find more information You can find more information about managing pain without opioids from: American Academy of Pain Medicine: painmed.org Institute for Chronic Pain: instituteforchronicpain.org American Chronic Pain Association: theacpa.org Contact a health care provider if: You have side effects from pain medicine. Your pain gets worse or does not get better with treatments or home therapy. You are struggling with anxiety or depression. Summary Many types of pain can be managed without opioids. Chronic pain may respond better to pain management without opioids. Pain is best managed when you and a team of health care providers work together. Pain management without opioids may include non-opioid medicines, medical treatments, physical therapy, mental health therapy, and lifestyle changes. Tell your health care providers if your pain gets worse or is not being managed well enough. This information is not intended to replace advice given to you by your health care provider. Make sure you discuss any questions you have with your health care provider. Document Revised: 08/15/2020 Document Reviewed: 08/15/2020 Elsevier Patient Education  2024 [employer].

## 2023-12-25 LAB — HM MAMMOGRAPHY

## 2023-12-31 ENCOUNTER — Other Ambulatory Visit (HOSPITAL_COMMUNITY): Payer: Self-pay

## 2023-12-31 MED ORDER — OXYCODONE-ACETAMINOPHEN 10-325 MG PO TABS
1.0000 | ORAL_TABLET | ORAL | 0 refills | Status: AC
  Filled 2023-12-31: qty 180, 30d supply, fill #0

## 2024-01-06 ENCOUNTER — Ambulatory Visit (INDEPENDENT_AMBULATORY_CARE_PROVIDER_SITE_OTHER): Admitting: Internal Medicine

## 2024-01-06 ENCOUNTER — Encounter: Payer: Self-pay | Admitting: Internal Medicine

## 2024-01-06 VITALS — BP 110/72 | HR 70 | Temp 98.0°F | Ht 66.0 in | Wt 133.0 lb

## 2024-01-06 DIAGNOSIS — D649 Anemia, unspecified: Secondary | ICD-10-CM

## 2024-01-06 DIAGNOSIS — M51362 Other intervertebral disc degeneration, lumbar region with discogenic back pain and lower extremity pain: Secondary | ICD-10-CM | POA: Diagnosis not present

## 2024-01-06 DIAGNOSIS — M48061 Spinal stenosis, lumbar region without neurogenic claudication: Secondary | ICD-10-CM | POA: Diagnosis not present

## 2024-01-06 DIAGNOSIS — F5101 Primary insomnia: Secondary | ICD-10-CM | POA: Diagnosis not present

## 2024-01-06 DIAGNOSIS — G8929 Other chronic pain: Secondary | ICD-10-CM | POA: Diagnosis not present

## 2024-01-06 DIAGNOSIS — R634 Abnormal weight loss: Secondary | ICD-10-CM

## 2024-01-06 MED ORDER — ALBUTEROL SULFATE HFA 108 (90 BASE) MCG/ACT IN AERS: 2.0000 | INHALATION_SPRAY | RESPIRATORY_TRACT | 5 refills | Status: DC | PRN

## 2024-01-06 MED ORDER — ZALEPLON 10 MG PO CAPS: 10.0000 mg | ORAL_CAPSULE | Freq: Every evening | ORAL | 1 refills | Status: DC | PRN

## 2024-01-06 NOTE — Assessment & Plan Note (Signed)
 LBPis better : s/p L3-L5 anterior posterior decompression and fusion in May 2025 - Dr JONETTA Trudy

## 2024-01-06 NOTE — Assessment & Plan Note (Addendum)
 LBPis better : s/p L3-L5 anterior posterior decompression and fusion in May 2025 - Dr D Trudy Get a coccyx pillow

## 2024-01-06 NOTE — Assessment & Plan Note (Signed)
 Check labs - iron, CBC

## 2024-01-06 NOTE — Assessment & Plan Note (Signed)
 Resolved

## 2024-01-06 NOTE — Assessment & Plan Note (Signed)
Chronic  On doxepin.  Zaleplon prn 2019  Potential benefits of a long term benzodiazepines  use as well as potential risks  and complications were explained to the patient and were aknowledged.

## 2024-01-06 NOTE — Progress Notes (Signed)
 Subjective:  Patient ID: Rebecca Marks, female    DOB: 08/19/1955  Age: 68 y.o. MRN: [id number]  CC: Medical Management of Chronic Issues (4 MNTH F/U, referral for colonoscopy.)   HPI Kolby Myung Fleck presents for LBP: s/p L3-L5 anterior posterior decompression and fusion in May 2025 - Dr JONETTA Pouch Follow-up on insomnia   Outpatient Medications Prior to Visit  Medication Sig Dispense Refill   Ascorbic Acid  (VITAMIN C) 1000 MG tablet Take 2,000 mg by mouth 2 (two) times daily as needed (immune system support).     Cholecalciferol (VITAMIN D3) 125 MCG (5000 UT) CAPS Take 10,000 Units by mouth every evening.     CORAL CALCIUM PO Take 1 Dose by mouth. Ionic Calcium (Mixed in bottle of water--sand formulation)     diclofenac  (VOLTAREN ) 75 MG EC tablet TAKE 1 TABLET BY MOUTH TWICE A DAY 60 tablet 3   diclofenac  (VOLTAREN ) 75 MG EC tablet Take 1 tablet (75 mg total) by mouth 2 (two) times daily. 60 tablet 2   doxepin  (SINEQUAN ) 10 MG capsule TAKE 1 CAPSULE BY MOUTH EVERY DAY AT BEDTIME AS NEEDED 90 capsule 1   doxepin  (SINEQUAN ) 10 MG capsule Take 2 capsules (20 mg total) by mouth daily as needed. 180 capsule 1   famotidine  (PEPCID ) 40 MG tablet TAKE 1 TABLET BY MOUTH EVERY DAY 90 tablet 3   famotidine  (PEPCID ) 40 MG tablet Take 1 tablet (40 mg total) by mouth daily. 30 tablet 3   fluconazole  (DIFLUCAN ) 150 MG tablet As needed     furosemide  (LASIX ) 20 MG tablet Take 1 tablet (20 mg total) by mouth daily. 30 tablet 5   magnesium hydroxide (MILK OF MAGNESIA) 400 MG/5ML suspension Take 15 mLs by mouth daily as needed for mild constipation.     methocarbamol  (ROBAXIN ) 500 MG tablet Take 1 tablet (500 mg total) by mouth 3 (three) times daily as needed. 90 tablet 1   metoprolol  succinate (TOPROL  XL) 25 MG 24 hr tablet Take one tablet daily; may start taking as needed 30 tablet 11   naloxone  (NARCAN ) nasal spray 4 mg/0.1 mL 1 actuation in one nostril once. May repeat in 2-3 min 1 each 2    ondansetron  (ZOFRAN ) 4 MG tablet TAKE ONE TABLET BY MOUTH EVERY 8 HOURS AS NEEDED FOR NAUSEA AND VOMITING     oxyCODONE -acetaminophen  (PERCOCET) 10-325 MG tablet Take 1 tablet by mouth 4 times daily as needed  for pain 120 tablet 0   oxyCODONE -acetaminophen  (PERCOCET) 10-325 MG tablet Take 1 tablet by mouth every 4 (four) hours as needed. 180 tablet 0   oxyCODONE -acetaminophen  (PERCOCET) 10-325 MG tablet Take 1 tablet by mouth every 4 (four) hours as needed for pain 180 tablet 0   PHOSPHATIDYLSERINE PO Take 300 mg by mouth at bedtime.     psyllium (HYDROCIL/METAMUCIL) 95 % PACK Take 1 packet by mouth daily.     triamcinolone  cream (KENALOG ) 0.1 % Apply 1 Application topically 2 (two) times daily. 80 g 3   VOLTAREN  1 % GEL Apply 1 application topically in the morning and at bedtime. Applied to both knees and lower back     albuterol  (VENTOLIN  HFA) 108 (90 Base) MCG/ACT inhaler TAKE 2 PUFFS BY MOUTH EVERY 6 HOURS AS NEEDED FOR WHEEZE OR SHORTNESS OF BREATH 18 each 3   zaleplon  (SONATA ) 10 MG capsule Take 1 capsule (10 mg total) by mouth at bedtime as needed for sleep. 90 capsule 1   No facility-administered medications prior to  visit.    ROS: Review of Systems  Constitutional:  Positive for fatigue. Negative for activity change, appetite change, chills and unexpected weight change.  HENT:  Negative for congestion, mouth sores and sinus pressure.   Eyes:  Negative for visual disturbance.  Respiratory:  Negative for cough and chest tightness.   Gastrointestinal:  Negative for abdominal pain and nausea.  Genitourinary:  Negative for difficulty urinating, frequency and vaginal pain.  Musculoskeletal:  Positive for back pain. Negative for gait problem.  Skin:  Negative for pallor and rash.  Neurological:  Negative for dizziness, tremors, weakness, numbness and headaches.  Psychiatric/Behavioral:  Positive for sleep disturbance. Negative for confusion and suicidal ideas.     Objective:  BP  110/72   Pulse 70   Temp 98 F (36.7 C) (Oral)   Ht 5' 6 (1.676 m)   Wt 133 lb (60.3 kg)   SpO2 97%   BMI 21.47 kg/m   BP Readings from Last 3 Encounters:  01/06/24 110/72  10/07/23 118/80  06/23/23 106/70    Wt Readings from Last 3 Encounters:  01/06/24 133 lb (60.3 kg)  12/10/23 132 lb (59.9 kg)  10/07/23 134 lb 6.4 oz (61 kg)    Physical Exam Constitutional:      General: She is not in acute distress.    Appearance: Normal appearance. She is well-developed.  HENT:     Head: Normocephalic.     Right Ear: External ear normal.     Left Ear: External ear normal.     Nose: Nose normal.  Eyes:     General:        Right eye: No discharge.        Left eye: No discharge.     Conjunctiva/sclera: Conjunctivae normal.     Pupils: Pupils are equal, round, and reactive to light.  Neck:     Thyroid : No thyromegaly.     Vascular: No JVD.     Trachea: No tracheal deviation.  Cardiovascular:     Rate and Rhythm: Normal rate and regular rhythm.     Heart sounds: Normal heart sounds.  Pulmonary:     Effort: No respiratory distress.     Breath sounds: No stridor. No wheezing.  Abdominal:     General: Bowel sounds are normal. There is no distension.     Palpations: Abdomen is soft. There is no mass.     Tenderness: There is no abdominal tenderness. There is no guarding or rebound.  Musculoskeletal:        General: Tenderness present.     Cervical back: Normal range of motion and neck supple. No rigidity.     Right lower leg: No edema.     Left lower leg: No edema.  Lymphadenopathy:     Cervical: No cervical adenopathy.  Skin:    Findings: No erythema or rash.  Neurological:     Cranial Nerves: No cranial nerve deficit.     Motor: No abnormal muscle tone.     Coordination: Coordination normal.     Deep Tendon Reflexes: Reflexes normal.  Psychiatric:        Behavior: Behavior normal.        Thought Content: Thought content normal.        Judgment: Judgment normal.    Lumbar spine - tender with range of motion  Lab Results  Component Value Date   WBC 7.2 10/30/2022   HGB 13.4 10/30/2022   HCT 38.1 10/30/2022   PLT 253 10/30/2022  GLUCOSE 94 11/07/2022   CHOL 164 08/26/2021   TRIG 344.0 (H) 08/26/2021   HDL 38.00 (L) 08/26/2021   LDLDIRECT 92.0 08/26/2021   LDLCALC 90 04/21/2018   ALT 4 08/26/2021   AST 15 08/26/2021   NA 138 11/07/2022   K 4.2 11/07/2022   CL 99 11/07/2022   CREATININE 0.89 11/07/2022   BUN 17 11/07/2022   CO2 30 11/07/2022   TSH 1.525 10/30/2022   INR 1.0 09/12/2015   HGBA1C 4.4 (L) 07/30/2006    No results found.  Assessment & Plan:   Problem List Items Addressed This Visit     Anemia   Check labs - iron, CBC      Relevant Orders   Comprehensive metabolic panel with GFR   CBC with Differential/Platelet   Iron, TIBC and Ferritin Panel   T4, free   Urinalysis   TSH   Chronic low back pain - Primary   LBPis better : s/p L3-L5 anterior posterior decompression and fusion in May 2025 - Dr D Trudy Get a coccyx pillow      Degeneration of lumbar intervertebral disc   LBPis better : s/p L3-L5 anterior posterior decompression and fusion in May 2025 - Dr JONETTA Trudy      Degenerative lumbar spinal stenosis   LBPis better : s/p L3-L5 anterior posterior decompression and fusion in May 2025 - Dr JONETTA Trudy      Relevant Orders   Urinalysis   Insomnia   Chronic  On doxepin .  Zaleplon  prn 2019  Potential benefits of a long term benzodiazepines  use as well as potential risks  and complications were explained to the patient and were aknowledged.      Weight loss   Resolved      Relevant Orders   Comprehensive metabolic panel with GFR   CBC with Differential/Platelet   Iron, TIBC and Ferritin Panel   T4, free   Urinalysis   TSH      Meds ordered this encounter  Medications   zaleplon  (SONATA ) 10 MG capsule    Sig: Take 1 capsule (10 mg total) by mouth at bedtime as needed for sleep.     Dispense:  90 capsule    Refill:  1   albuterol  (VENTOLIN  HFA) 108 (90 Base) MCG/ACT inhaler    Sig: Inhale 2 puffs into the lungs every 4 (four) hours as needed for wheezing or shortness of breath.    Dispense:  8 each    Refill:  5      Follow-up: Return in about 6 months (around 07/08/2024) for a follow-up visit.  Marolyn Noel, MD

## 2024-01-11 ENCOUNTER — Encounter: Payer: Self-pay | Admitting: Internal Medicine

## 2024-01-11 DIAGNOSIS — M792 Neuralgia and neuritis, unspecified: Secondary | ICD-10-CM

## 2024-01-26 ENCOUNTER — Encounter: Payer: Self-pay | Admitting: Gastroenterology

## 2024-01-28 ENCOUNTER — Other Ambulatory Visit (HOSPITAL_COMMUNITY): Payer: Self-pay

## 2024-01-28 MED ORDER — OXYCODONE-ACETAMINOPHEN 10-325 MG PO TABS
1.0000 | ORAL_TABLET | ORAL | 0 refills | Status: DC | PRN
  Filled 2024-01-28: qty 180, 30d supply, fill #0

## 2024-02-04 ENCOUNTER — Telehealth: Payer: Self-pay

## 2024-02-04 NOTE — Telephone Encounter (Signed)
 Rebecca Marks is scheduled for Pre-Visit on 02/22/24 and a colonoscopy on 03/08/24 with Dr. Leigh. 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 Please review this patients chart to insure that she is okay to proceed to have her procedure at the [facility]. The patient was last seen by Dr. Leigh in 2016. Thanks!   Inocente Shams, LPN ( PV )

## 2024-02-22 ENCOUNTER — Ambulatory Visit (AMBULATORY_SURGERY_CENTER): Payer: PRIVATE HEALTH INSURANCE

## 2024-02-22 VITALS — Ht 66.0 in | Wt 133.0 lb

## 2024-02-22 DIAGNOSIS — Z860100 Personal history of colon polyps, unspecified: Secondary | ICD-10-CM

## 2024-02-22 MED ORDER — NA SULFATE-K SULFATE-MG SULF 17.5-3.13-1.6 GM/177ML PO SOLN: 1.0000 | Freq: Once | ORAL | 0 refills | Status: AC

## 2024-02-22 NOTE — Progress Notes (Signed)
 Pre visit completed in person; Patient verified name, DOB, and address; No egg or soy allergy known to patient  No issues known to pt with past sedation with any surgeries or procedures; Patient denies ever being told they had issues or difficulty with intubation;  No FH of Malignant Hyperthermia; Pt is not on diet pills; Pt is not on home 02;  Pt is not on blood thinners;  Pt reports issues with constipation-takes Dulcolax/Metamucil/ and MOM PRN and was advised to  No A fib or A flutter Have any cardiac testing pending--NO Insurance verified during PV appt--- Medicare A/B Pt can ambulate without assistance;  Pt denies use of chewing tobacco Discussed diabetic/weight loss medication holds; Discussed NSAID holds; Checked BMI to be less than 50; Pt instructed to use Singlecare.com or GoodRx for a price reduction on prep  Patient's chart reviewed by Norleen Schillings CNRA prior to previsit and patient appropriate for the [facility].  Pre visit completed and red dot placed by patient's name on their procedure day (on provider's schedule).   Instructions sent to MyChart as well as printed and given to patient at time of PV appt;

## 2024-02-25 ENCOUNTER — Other Ambulatory Visit (HOSPITAL_COMMUNITY): Payer: Self-pay

## 2024-02-25 MED ORDER — OXYCODONE-ACETAMINOPHEN 10-325 MG PO TABS
1.0000 | ORAL_TABLET | ORAL | 0 refills | Status: AC | PRN
  Filled 2024-02-26: qty 180, 30d supply, fill #0

## 2024-02-25 MED ORDER — DICLOFENAC SODIUM 75 MG PO TBEC
75.0000 mg | DELAYED_RELEASE_TABLET | Freq: Two times a day (BID) | ORAL | 2 refills | Status: AC
  Filled 2024-02-25: qty 60, 30d supply, fill #0

## 2024-02-25 MED ORDER — METHOCARBAMOL 500 MG PO TABS
500.0000 mg | ORAL_TABLET | Freq: Three times a day (TID) | ORAL | 1 refills | Status: AC | PRN
  Filled 2024-02-25: qty 90, 30d supply, fill #0

## 2024-02-26 ENCOUNTER — Other Ambulatory Visit (HOSPITAL_COMMUNITY): Payer: Self-pay

## 2024-02-26 ENCOUNTER — Other Ambulatory Visit: Payer: Self-pay

## 2024-02-28 ENCOUNTER — Other Ambulatory Visit (HOSPITAL_COMMUNITY): Payer: Self-pay

## 2024-03-08 ENCOUNTER — Ambulatory Visit (AMBULATORY_SURGERY_CENTER): Payer: PRIVATE HEALTH INSURANCE | Admitting: Gastroenterology

## 2024-03-08 ENCOUNTER — Encounter: Payer: Self-pay | Admitting: Gastroenterology

## 2024-03-08 VITALS — BP 111/58 | HR 66 | Temp 97.6°F | Resp 11 | Ht 66.0 in | Wt 133.0 lb

## 2024-03-08 DIAGNOSIS — Z1211 Encounter for screening for malignant neoplasm of colon: Secondary | ICD-10-CM | POA: Diagnosis not present

## 2024-03-08 DIAGNOSIS — K648 Other hemorrhoids: Secondary | ICD-10-CM

## 2024-03-08 DIAGNOSIS — Z860100 Personal history of colon polyps, unspecified: Secondary | ICD-10-CM

## 2024-03-08 DIAGNOSIS — D120 Benign neoplasm of cecum: Secondary | ICD-10-CM | POA: Diagnosis not present

## 2024-03-08 DIAGNOSIS — K5730 Diverticulosis of large intestine without perforation or abscess without bleeding: Secondary | ICD-10-CM

## 2024-03-08 DIAGNOSIS — Z860101 Personal history of adenomatous and serrated colon polyps: Secondary | ICD-10-CM

## 2024-03-08 DIAGNOSIS — Q439 Congenital malformation of intestine, unspecified: Secondary | ICD-10-CM

## 2024-03-08 MED ORDER — SODIUM CHLORIDE 0.9 % IV SOLN: 500.0000 mL | INTRAVENOUS | Status: DC

## 2024-03-08 NOTE — Progress Notes (Signed)
 To pacu, VSS. Report to [email or web address]

## 2024-03-08 NOTE — Progress Notes (Addendum)
 Samples of 145mcg Linzess given to pt (4 boxes).

## 2024-03-08 NOTE — Op Note (Signed)
 [facility] Patient Name: Rebecca Marks Procedure Date: 03/08/2024 1:34 PM MRN: [id number] Endoscopist: Elspeth P. Leigh , MD, [id number] Age: 68 Referring MD:  Date of Birth: 07-20-1955 Gender: Female Account #: [id number] Procedure:                Colonoscopy Indications:              High risk colon cancer surveillance: Personal                            history of colonic polyps - adenoma / SSP removed                            01/2015, patient endorses chronic constipation -                            does not like Miralax. Using dulcolax, interested                            in other options Medicines:                Monitored Anesthesia Care Procedure:                Pre-Anesthesia Assessment:                           - Prior to the procedure, a History and Physical                            was performed, and patient medications and                            allergies were reviewed. The patient's tolerance of                            previous anesthesia was also reviewed. The risks                            and benefits of the procedure and the sedation                            options and risks were discussed with the patient.                            All questions were answered, and informed consent                            was obtained. Prior Anticoagulants: The patient has                            taken no anticoagulant or antiplatelet agents. ASA                            Grade Assessment: II - A patient with mild systemic  disease. After reviewing the risks and benefits,                            the patient was deemed in satisfactory condition to                            undergo the procedure.                           After obtaining informed consent, the colonoscope                            was passed under direct vision. Throughout the                            procedure, the patient's blood  pressure, pulse, and                            oxygen saturations were monitored continuously. The                            Olympus Scope SN: [id number] was introduced through                            the anus and advanced to the the cecum, identified                            by appendiceal orifice and ileocecal valve. The                            colonoscopy was performed without difficulty. The                            patient tolerated the procedure well. The quality                            of the bowel preparation was adequate. The                            ileocecal valve, appendiceal orifice, and rectum                            were photographed. Scope In: 1:45:13 PM Scope Out: 2:01:43 PM Scope Withdrawal Time: 0 hours 11 minutes 4 seconds  Total Procedure Duration: 0 hours 16 minutes 30 seconds  Findings:                 The perianal and digital rectal examinations were                            normal.                           A 3 mm polyp was found in the cecum. The polyp was  sessile. The polyp was removed with a cold snare.                            Resection and retrieval were complete.                           The colon was tortuous.                           Diverticula were found in the left colon.                           Internal hemorrhoids were found during                            retroflexion. The hemorrhoids were small.                           The exam was otherwise without abnormality. Complications:            No immediate complications. Estimated blood loss:                            Minimal. Estimated Blood Loss:     Estimated blood loss was minimal. Impression:               - One 3 mm polyp in the cecum, removed with a cold                            snare. Resected and retrieved.                           - Tortuous colon.                           - Diverticulosis in the left colon.                            - Internal hemorrhoids.                           - The examination was otherwise normal. Recommendation:           - Patient has a contact number available for                            emergencies. The signs and symptoms of potential                            delayed complications were discussed with the                            patient. Return to normal activities tomorrow.                            Written discharge instructions were provided to the  patient.                           - Resume previous diet.                           - Continue present medications.                           - Await pathology results.                           - Consideration for trial of Linzess 145mcg / day                            if not yet tried, we can offer free samples from                            the office. Elspeth P. Kaien Pezzullo, MD 03/08/2024 2:06:25 PM This report has been signed electronically.

## 2024-03-08 NOTE — Patient Instructions (Signed)

## 2024-03-08 NOTE — Progress Notes (Signed)
 [facility] History and Physical   Primary Care Physician:  Plotnikov, Karlynn GAILS, MD   Reason for Procedure:   History of polyps  Plan:    colonoscopy     HPI: Rebecca Marks is a 68 y.o. female  here for colonoscopy surveillance - adenoma / SSP removed 01/2015.  Rebecca Marks Patient endorses chronic constipation. No family history of colon cancer known. Otherwise feels well without any cardiopulmonary symptoms.   I have discussed risks / benefits of anesthesia and endoscopic procedure with Rebecca Marks and they wish to proceed with the exams as outlined today.    Past Medical History:  Diagnosis Date   Allergy    Arthritis    R wrist and R knee   CAP (community acquired pneumonia) 4/17-22/15   Rochephin & IV Zithromax  & 3 days Levaquin    Chronic back pain    Clotting disorder    DVT (deep venous thrombosis) (HCC)    a. in her 20s while on OCPs.   Dyspnea    GERD (gastroesophageal reflux disease)    Hepatitis C    contaminated blood @ her job, prior notes indicate spontaneously cleared   History of tobacco abuse    Osteoarthritis    Palpitations    Premature atrial contractions    a. Holter 11/2013: few PACs.   Pulmonary function study abnormality    a. 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   Raynaud phenomenon    Ruptured lumbar disc    4 herniated disc    Volume overload    a.  history of volume overload during hospital stay for PNA in 08/2013 felt iatrogenic due to receiving a lot of IV fluid. 2D echo 08/2013 showed EF 60-65%, normal diastolic function parameters.    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 Authorizing Provider  albuterol  (VENTOLIN  HFA) 108 (90 Base) MCG/ACT inhaler Inhale 2 puffs into the lungs every 4 (four) hours  as needed for wheezing or shortness of breath. Patient taking differently: Inhale 2 puffs into the lungs at bedtime. 01/06/24  Yes Plotnikov, Aleksei V, MD  b complex vitamins capsule Take 1 capsule by mouth daily.   Yes [provider]  bisacodyl  (DULCOLAX) 5 MG EC tablet Take 5 mg by mouth daily as needed for moderate constipation.   Yes [provider]  Cholecalciferol (VITAMIN D3) 125 MCG (5000 UT) CAPS Take 10,000 Units by mouth every evening.   Yes [provider]  CORAL CALCIUM PO Take 1 Dose by mouth. Ionic Calcium (Mixed in bottle of water--sand formulation)   Yes [provider]  doxepin  (SINEQUAN ) 10 MG capsule TAKE 1 CAPSULE BY MOUTH EVERY DAY AT BEDTIME AS NEEDED Patient taking differently: Take 10 mg by mouth at bedtime. 02/05/23  Yes Plotnikov, Aleksei V, MD  magnesium hydroxide (MILK OF MAGNESIA) 400 MG/5ML suspension Take 15 mLs by mouth daily as needed for mild constipation.   Yes [provider]  methocarbamol  (ROBAXIN ) 500 MG tablet Take 1 tablet (500 mg total) by mouth 3 (three) times daily as needed. 12/01/23  Yes   oxyCODONE -acetaminophen  (PERCOCET) 10-325 MG tablet Take 1 tablet by mouth 4 times daily as needed  for pain 11/07/22  Yes   zaleplon  (SONATA ) 10 MG capsule  Take 1 capsule (10 mg total) by mouth at bedtime as needed for sleep. 01/06/24  Yes Plotnikov, Karlynn GAILS, MD  diclofenac  (VOLTAREN ) 75 MG EC tablet TAKE 1 TABLET BY MOUTH TWICE A DAY 12/17/22   Plotnikov, Aleksei V, MD  diclofenac  (VOLTAREN ) 75 MG EC tablet Take 1 tablet (75 mg total) by mouth 2 (two) times daily. Patient not taking: No sig reported 11/16/23     diclofenac  (VOLTAREN ) 75 MG EC tablet Take 1 tablet (75 mg total) by mouth 2 (two) times daily. 02/25/24     doxepin  (SINEQUAN ) 10 MG capsule Take 2 capsules (20 mg total) by mouth daily as needed. Patient not taking: No sig reported 12/01/23     famotidine  (PEPCID ) 40 MG tablet TAKE 1 TABLET BY MOUTH EVERY DAY  06/12/22   Plotnikov, Aleksei V, MD  famotidine  (PEPCID ) 40 MG tablet Take 1 tablet (40 mg total) by mouth daily. Patient not taking: No sig reported 11/16/23     furosemide  (LASIX ) 20 MG tablet Take 1 tablet (20 mg total) by mouth daily. Patient taking differently: Take 20 mg by mouth daily as needed for fluid. 10/30/22 02/22/24  Marcine Catalan M, PA-C  methocarbamol  (ROBAXIN ) 500 MG tablet Take 1 tablet (500 mg total) by mouth 3 (three) times daily as needed. 02/25/24     oxyCODONE -acetaminophen  (PERCOCET) 10-325 MG tablet Take 1 tablet by mouth every 4 (four) hours as needed. Patient not taking: No sig reported 12/01/23     oxyCODONE -acetaminophen  (PERCOCET) 10-325 MG tablet Take 1 tablet by mouth every 4 (four) hours as needed for pain 12/31/23     oxyCODONE -acetaminophen  (PERCOCET) 10-325 MG tablet Take 1 tablet by mouth every 4 (four) hours as needed for pain 02/25/24     PHOSPHATIDYLSERINE PO Take 300 mg by mouth at bedtime.    [provider]  psyllium (HYDROCIL/METAMUCIL) 95 % PACK Take 1 packet by mouth daily.    [provider]  VOLTAREN  1 % GEL Apply 1 application topically in the morning and at bedtime. Applied to both knees and lower back 07/31/13   [provider]    Current Outpatient Medications  Medication Sig Dispense Refill   albuterol  (VENTOLIN  HFA) 108 (90 Base) MCG/ACT inhaler Inhale 2 puffs into the lungs every 4 (four) hours as needed for wheezing or shortness of breath. (Patient taking differently: Inhale 2 puffs into the lungs at bedtime.) 8 each 5   b complex vitamins capsule Take 1 capsule by mouth daily.     bisacodyl  (DULCOLAX) 5 MG EC tablet Take 5 mg by mouth daily as needed for moderate constipation.     Cholecalciferol (VITAMIN D3) 125 MCG (5000 UT) CAPS Take 10,000 Units by mouth every evening.     CORAL CALCIUM PO Take 1 Dose by mouth. Ionic Calcium (Mixed in bottle of water--sand formulation)     doxepin  (SINEQUAN ) 10 MG capsule TAKE 1  CAPSULE BY MOUTH EVERY DAY AT BEDTIME AS NEEDED (Patient taking differently: Take 10 mg by mouth at bedtime.) 90 capsule 1   magnesium hydroxide (MILK OF MAGNESIA) 400 MG/5ML suspension Take 15 mLs by mouth daily as needed for mild constipation.     methocarbamol  (ROBAXIN ) 500 MG tablet Take 1 tablet (500 mg total) by mouth 3 (three) times daily as needed. 90 tablet 1   oxyCODONE -acetaminophen  (PERCOCET) 10-325 MG tablet Take 1 tablet by mouth 4 times daily as needed  for pain 120 tablet 0   zaleplon  (SONATA ) 10 MG capsule Take 1  capsule (10 mg total) by mouth at bedtime as needed for sleep. 90 capsule 1   diclofenac  (VOLTAREN ) 75 MG EC tablet TAKE 1 TABLET BY MOUTH TWICE A DAY 60 tablet 3   diclofenac  (VOLTAREN ) 75 MG EC tablet Take 1 tablet (75 mg total) by mouth 2 (two) times daily. (Patient not taking: No sig reported) 60 tablet 2   diclofenac  (VOLTAREN ) 75 MG EC tablet Take 1 tablet (75 mg total) by mouth 2 (two) times daily. 60 tablet 2   doxepin  (SINEQUAN ) 10 MG capsule Take 2 capsules (20 mg total) by mouth daily as needed. (Patient not taking: No sig reported) 180 capsule 1   famotidine  (PEPCID ) 40 MG tablet TAKE 1 TABLET BY MOUTH EVERY DAY 90 tablet 3   famotidine  (PEPCID ) 40 MG tablet Take 1 tablet (40 mg total) by mouth daily. (Patient not taking: No sig reported) 30 tablet 3   furosemide  (LASIX ) 20 MG tablet Take 1 tablet (20 mg total) by mouth daily. (Patient taking differently: Take 20 mg by mouth daily as needed for fluid.) 30 tablet 5   methocarbamol  (ROBAXIN ) 500 MG tablet Take 1 tablet (500 mg total) by mouth 3 (three) times daily as needed. 90 tablet 1   oxyCODONE -acetaminophen  (PERCOCET) 10-325 MG tablet Take 1 tablet by mouth every 4 (four) hours as needed. (Patient not taking: No sig reported) 180 tablet 0   oxyCODONE -acetaminophen  (PERCOCET) 10-325 MG tablet Take 1 tablet by mouth every 4 (four) hours as needed for pain 180 tablet 0   oxyCODONE -acetaminophen  (PERCOCET) 10-325  MG tablet Take 1 tablet by mouth every 4 (four) hours as needed for pain 180 tablet 0   PHOSPHATIDYLSERINE PO Take 300 mg by mouth at bedtime.     psyllium (HYDROCIL/METAMUCIL) 95 % PACK Take 1 packet by mouth daily.     VOLTAREN  1 % GEL Apply 1 application topically in the morning and at bedtime. Applied to both knees and lower back     Current Facility-Administered Medications  Medication Dose Route Frequency Provider Last Rate Last Admin   0.9 %  sodium chloride  infusion  500 mL Intravenous Continuous Quatisha Zylka, Elspeth SQUIBB, MD        Allergies as of 03/08/2024 - Review Complete 03/08/2024  Allergen Reaction Noted   Risperidone Other (See Comments) 06/23/2023   Ritalin  [methylphenidate  hcl] Other (See Comments) 08/26/2016   Venlafaxine  Other (See Comments) 09/13/2021   Cymbalta  [duloxetine  hcl] Other (See Comments) 09/17/2021    Family History  Problem Relation Age of Onset   Stroke Mother 72       after bedridden post fall with BLE paralysis   Breast cancer Mother         X 3   Diabetes Father    Hypertension Father    Heart attack Father        >55   Lymphoma Father    Heart disease Father    Breast cancer Sister    Alzheimer's disease Maternal Grandmother    Stroke Maternal Grandfather        in 27s   Colon cancer Neg Hx    Colon polyps Neg Hx    Esophageal cancer Neg Hx    Rectal cancer Neg Hx    Stomach cancer Neg Hx     Social History   Socioeconomic History   Marital status: Married    Spouse name: Not on file   Number of children: Not on file   Years of education: Not on file  Highest education level: Not on file  Occupational History   Not on file  Tobacco Use   Smoking status: Former    Current packs/day: 0.00    Average packs/day: 0.5 packs/day for 25.0 years (12.5 ttl pk-yrs)    Types: Cigarettes    Start date: 08/19/1988    Quit date: 08/19/2013    Years since quitting: 10.5   Smokeless tobacco: Never   Tobacco comments:    smoked 1979-2008, up  to 1 ppd. As of 08/01/13 1 cig every 2 weeks  Vaping Use   Vaping status: Never Used  Substance and Sexual Activity   Alcohol use: Yes    Alcohol/week: 2.0 standard drinks of alcohol    Types: 2 Standard drinks or equivalent per week    Comment: 1 Beer nightly   Drug use: No   Sexual activity: Yes    Birth control/protection: None  Other Topics Concern   Not on file  Social History Narrative   She lives with husband. She does not have children.   She is a retired Engineer, civil (consulting).    She lives in 3 story home.    Social Drivers of Corporate investment banker Strain: Low Risk  (12/10/2023)   Overall Financial Resource Strain (CARDIA)    Difficulty of Paying Living Expenses: Not very hard  Food Insecurity: No Food Insecurity (12/10/2023)   Hunger Vital Sign    Worried About Running Out of Food in the Last Year: Never true    Ran Out of Food in the Last Year: Never true  Transportation Needs: No Transportation Needs (12/10/2023)   PRAPARE - Administrator, Civil Service (Medical): No    Lack of Transportation (Non-Medical): No  Physical Activity: Insufficiently Active (12/10/2023)   Exercise Vital Sign    Days of Exercise per Week: 5 days    Minutes of Exercise per Session: 20 min  Stress: Stress Concern Present (12/10/2023)   [employer] of Occupational Health - Occupational Stress Questionnaire    Feeling of Stress: To some extent  Social Connections: Moderately Integrated (12/10/2023)   Social Connection and Isolation Panel    Frequency of Communication with Friends and Family: More than three times a week    Frequency of Social Gatherings with Friends and Family: Once a week    Attends Religious Services: Never    Database administrator or Organizations: No    Attends Engineer, structural: 1 to 4 times per year    Marital Status: Married  Catering manager Violence: Not At Risk (12/10/2023)   Humiliation, Afraid, Rape, and Kick questionnaire    Fear of Current  or Ex-Partner: No    Emotionally Abused: No    Physically Abused: No    Sexually Abused: No    Review of Systems: All other review of systems negative except as mentioned in the HPI.  Physical Exam: Vital signs BP 128/71   Pulse 72   Temp 97.6 F (36.4 C)   Resp 16   Ht 5' 6 (1.676 m)   Wt 133 lb (60.3 kg)   SpO2 99%   BMI 21.47 kg/m   General:   Alert,  Well-developed, pleasant and cooperative in NAD Lungs:  Clear throughout to auscultation.   Heart:  Regular rate and rhythm Abdomen:  Soft, nontender and nondistended.   Neuro/Psych:  Alert and cooperative. Normal mood and affect. A and O x 3  Marcey Naval, MD [facility]

## 2024-03-08 NOTE — Progress Notes (Signed)
 Called to room to assist during endoscopic procedure.  Patient ID and intended procedure confirmed with present staff. Received instructions for my participation in the procedure from the performing physician.

## 2024-03-08 NOTE — Progress Notes (Signed)
 Pt's states no medical or surgical changes since previsit or office visit.

## 2024-03-09 ENCOUNTER — Telehealth: Payer: Self-pay | Admitting: *Deleted

## 2024-03-09 NOTE — Telephone Encounter (Signed)
  Follow up Call-     03/08/2024   12:57 PM  Call back number  Post procedure Call Back phone  # [phone]  Permission to leave phone message Yes   Left message on machine to call back if any questions or concerns

## 2024-03-11 LAB — SURGICAL PATHOLOGY

## 2024-03-14 ENCOUNTER — Ambulatory Visit: Payer: Self-pay | Admitting: Gastroenterology

## 2024-03-23 ENCOUNTER — Other Ambulatory Visit: Payer: Self-pay

## 2024-03-23 ENCOUNTER — Other Ambulatory Visit (HOSPITAL_COMMUNITY): Payer: Self-pay

## 2024-03-23 MED ORDER — OXYCODONE-ACETAMINOPHEN 10-325 MG PO TABS
1.0000 | ORAL_TABLET | ORAL | 0 refills | Status: DC | PRN
  Filled 2024-03-30: qty 180, 30d supply, fill #0

## 2024-03-23 MED ORDER — METHOCARBAMOL 500 MG PO TABS
500.0000 mg | ORAL_TABLET | Freq: Three times a day (TID) | ORAL | 1 refills | Status: AC | PRN
  Filled 2024-03-23: qty 90, 30d supply, fill #0

## 2024-03-30 ENCOUNTER — Other Ambulatory Visit (HOSPITAL_COMMUNITY): Payer: Self-pay

## 2024-03-30 ENCOUNTER — Other Ambulatory Visit: Payer: Self-pay

## 2024-04-07 ENCOUNTER — Other Ambulatory Visit: Payer: Self-pay | Admitting: Internal Medicine

## 2024-04-07 MED ORDER — ALBUTEROL SULFATE HFA 108 (90 BASE) MCG/ACT IN AERS
2.0000 | INHALATION_SPRAY | RESPIRATORY_TRACT | 5 refills | Status: AC | PRN
Start: 2024-04-07 — End: ?

## 2024-04-07 NOTE — Telephone Encounter (Signed)
 Copied from CRM #[id number]. Topic: Clinical - Medication Refill >> Apr 07, 2024 11:47 AM Alexandria E wrote: Medication: albuterol  (VENTOLIN  HFA) 108 (90 Base) MCG/ACT inhaler Mupirocin 2% ointment  Has the patient contacted their pharmacy? Yes (Agent: If no, request that the patient contact the pharmacy for the refill. If patient does not wish to contact the pharmacy document the reason why and proceed with request.) (Agent: If yes, when and what did the pharmacy advise?)  This is the patient's preferred pharmacy:  [id number] [address] [facility] - [address] [place] [address] Phone: [phone] Fax: [phone]   Is this the correct pharmacy for this prescription? Yes If no, delete pharmacy and type the correct one.   Has the prescription been filled recently? No  Is the patient out of the medication? Yes, patient really hoping this can get filled today if possible.  Has the patient been seen for an appointment in the last year OR does the patient have an upcoming appointment? Yes  Can we respond through MyChart? Yes  Agent: Please be advised that Rx refills may take up to 3 business days. We ask that you follow-up with your pharmacy.

## 2024-04-07 NOTE — Telephone Encounter (Signed)
 Mupirocin 2% ointment, unable to pend

## 2024-04-11 NOTE — Progress Notes (Signed)
 [facility] [address] [place] [address] Phone: [phone] Subjective:   Rebecca Marks, am serving as a scribe for Dr. Arthea Claudene.  I'm seeing this patient by the request  of:  Plotnikov, Karlynn GAILS, MD  CC: Right toenail, bilateral knee and back.  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  We discussed bracing and given a Tru pull lite today.  Discussed avoiding the narcotics if possible.  Patient has had back pain and has been doing gabapentin .  Home exercises given for vastus medialis oblique strengthening.  Patient declined formal physical therapy.  May be a candidate for PRP and patient will consider that but hopefully just with the bracing it will be helpful.  Follow-up again in 6 weeks     Updated 04/19/2024 Rebecca Marks is a 68 y.o. female coming in with complaint of R big toe and B knee pain. Had back surgery in May. Fused from L3-L5. Having a hard time walking due to B knee pain.   Patient has corns on medial aspect of great toes that she attributes to an antalgic gait from her back.   Using voltaren  gel. Has oral voltaren  but it was not helpful. Using IBU 800mg  daily. Also using joint health oral medicaiton.       Past Medical History:  Diagnosis Date   Allergy    Arthritis    R wrist and R knee   CAP (community acquired pneumonia) 4/17-22/15   Rochephin & IV Zithromax  & 3 days Levaquin    Chronic back pain    Clotting disorder    DVT (deep venous thrombosis) (HCC)    a. in her 20s while on OCPs.   Dyspnea    GERD (gastroesophageal reflux disease)    Hepatitis C    contaminated blood @ her job, prior notes indicate spontaneously cleared   History of tobacco abuse    Osteoarthritis    Palpitations    Premature atrial contractions    a. Holter 11/2013: few PACs.   Pulmonary function study abnormality    a. 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   Raynaud phenomenon    Ruptured  lumbar disc    4 herniated disc    Volume overload    a.  history of volume overload during hospital stay for PNA in 08/2013 felt iatrogenic due to receiving a lot of IV fluid. 2D echo 08/2013 showed EF 60-65%, normal diastolic function parameters.   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 As of 08/01/13 1 cig every 2 weeks  Vaping Use   Vaping status: Never Used  Substance and Sexual  Activity   Alcohol use: Yes    Alcohol/week: 2.0 standard drinks of alcohol    Types: 2 Standard drinks or equivalent per week    Comment: 1 Beer nightly   Drug use: No   Sexual activity: Yes    Birth control/protection: None  Other Topics Concern   Not on file  Social History Narrative   She lives with husband. She does not have children.   She is a retired engineer, civil (consulting).    She lives in 3 story home.    Social Drivers of Corporate Investment Banker Strain: Low Risk  (12/10/2023)   Overall Financial Resource Strain (CARDIA)    Difficulty of Paying Living Expenses: Not very hard  Food Insecurity: No Food Insecurity (12/10/2023)   Hunger Vital Sign    Worried About Running Out of Food in the Last Year: Never true    Ran Out of Food in the Last Year: Never true  Transportation Needs: No Transportation Needs (12/10/2023)   PRAPARE -  Administrator, Civil Service (Medical): No    Lack of Transportation (Non-Medical): No  Physical Activity: Insufficiently Active (12/10/2023)   Exercise Vital Sign    Days of Exercise per Week: 5 days    Minutes of Exercise per Session: 20 min  Stress: Stress Concern Present (12/10/2023)   [employer] of Occupational Health - Occupational Stress Questionnaire    Feeling of Stress: To some extent  Social Connections: Moderately Integrated (12/10/2023)   Social Connection and Isolation Panel    Frequency of Communication with Friends and Family: More than three times a week    Frequency of Social Gatherings with Friends and Family: Once a week    Attends Religious Services: Never    Database Administrator or Organizations: No    Attends Engineer, Structural: 1 to 4 times per year    Marital Status: Married   Allergies  Allergen Reactions   Risperidone Other (See Comments)    Other Reaction(s): nervous/hyperactive   Ritalin  [Methylphenidate  Hcl] Other (See Comments)    Too hyper   Venlafaxine  Other (See Comments)    GERD   Cymbalta  [Duloxetine  Hcl] Other (See Comments)    Unwilling to take   Family History  Problem Relation Age of Onset   Stroke Mother 4       after bedridden post fall with BLE paralysis   Breast cancer Mother         X 3   Diabetes Father    Hypertension Father    Heart attack Father        >55   Lymphoma Father    Heart disease Father    Breast cancer Sister    Alzheimer's disease Maternal Grandmother    Stroke Maternal Grandfather        in 83s   Colon cancer Neg Hx    Colon polyps Neg Hx    Esophageal cancer Neg Hx    Rectal cancer Neg Hx    Stomach cancer Neg Hx      Current Outpatient Medications (Cardiovascular):    furosemide  (LASIX ) 20 MG tablet, Take 1 tablet (20 mg total) by mouth daily. (Patient taking differently: Take 20 mg by mouth daily as needed for fluid.)  Current Outpatient Medications (Respiratory):     albuterol  (VENTOLIN  HFA) 108 (90 Base) MCG/ACT inhaler, Inhale 2 puffs into the lungs every 4 (four) hours as needed for wheezing or shortness of breath.  Current Outpatient Medications (  Analgesics):    diclofenac  (VOLTAREN ) 75 MG EC tablet, TAKE 1 TABLET BY MOUTH TWICE A DAY   diclofenac  (VOLTAREN ) 75 MG EC tablet, Take 1 tablet (75 mg total) by mouth 2 (two) times daily.   diclofenac  (VOLTAREN ) 75 MG EC tablet, Take 1 tablet (75 mg total) by mouth 2 (two) times daily.   oxyCODONE -acetaminophen  (PERCOCET) 10-325 MG tablet, Take 1 tablet by mouth 4 times daily as needed  for pain   oxyCODONE -acetaminophen  (PERCOCET) 10-325 MG tablet, Take 1 tablet by mouth every 4 (four) hours as needed.   oxyCODONE -acetaminophen  (PERCOCET) 10-325 MG tablet, Take 1 tablet by mouth every 4 (four) hours as needed for pain   oxyCODONE -acetaminophen  (PERCOCET) 10-325 MG tablet, Take 1 tablet by mouth every 4 (four) hours as needed for pain   oxyCODONE -acetaminophen  (PERCOCET) 10-325 MG tablet, Take 1 tablet by mouth every 4 (four) hours as needed for pain.   Current Outpatient Medications (Other):    b complex vitamins capsule, Take 1 capsule by mouth daily.   bisacodyl  (DULCOLAX) 5 MG EC tablet, Take 5 mg by mouth daily as needed for moderate constipation.   Cholecalciferol (VITAMIN D3) 125 MCG (5000 UT) CAPS, Take 10,000 Units by mouth every evening.   CORAL CALCIUM PO, Take 1 Dose by mouth. Ionic Calcium (Mixed in bottle of water--sand formulation)   doxepin  (SINEQUAN ) 10 MG capsule, TAKE 1 CAPSULE BY MOUTH EVERY DAY AT BEDTIME AS NEEDED (Patient taking differently: Take 10 mg by mouth at bedtime.)   doxepin  (SINEQUAN ) 10 MG capsule, Take 2 capsules (20 mg total) by mouth daily as needed.   famotidine  (PEPCID ) 40 MG tablet, TAKE 1 TABLET BY MOUTH EVERY DAY   famotidine  (PEPCID ) 40 MG tablet, Take 1 tablet (40 mg total) by mouth daily.   magnesium hydroxide (MILK OF MAGNESIA) 400 MG/5ML suspension, Take 15  mLs by mouth daily as needed for mild constipation.   methocarbamol  (ROBAXIN ) 500 MG tablet, Take 1 tablet (500 mg total) by mouth 3 (three) times daily as needed.   methocarbamol  (ROBAXIN ) 500 MG tablet, Take 1 tablet (500 mg total) by mouth 3 (three) times daily as needed.   methocarbamol  (ROBAXIN ) 500 MG tablet, Take 1 tablet (500 mg total) by mouth 3 (three) times daily as needed.   PHOSPHATIDYLSERINE PO, Take 300 mg by mouth at bedtime.   psyllium (HYDROCIL/METAMUCIL) 95 % PACK, Take 1 packet by mouth daily.   VOLTAREN  1 % GEL, Apply 1 application topically in the morning and at bedtime. Applied to both knees and lower back   zaleplon  (SONATA ) 10 MG capsule, Take 1 capsule (10 mg total) by mouth at bedtime as needed for sleep.   Reviewed prior external information including notes and imaging from  primary care provider As well as notes that were available from care everywhere and other healthcare systems.  Past medical history, social, surgical and family history all reviewed in electronic medical record.  No pertanent information unless stated regarding to the chief complaint.   Review of Systems:  No headache, visual changes, nausea, vomiting, diarrhea, constipation, dizziness, abdominal pain, skin rash, fevers, chills, night sweats, weight loss, swollen lymph nodes, body aches, joint swelling, chest pain, shortness of breath, mood changes. POSITIVE muscle aches  Objective  Blood pressure 118/76, pulse 74, height 5' 6 (1.676 m), weight 133 lb (60.3 kg), SpO2 97%.   General: No apparent distress alert and oriented x3 mood and affect normal, dressed appropriately.  HEENT: Pupils equal, extraocular movements intact  Respiratory:  Patient's speak in full sentences and does not appear short of breath  Cardiovascular: No lower extremity edema, non tender, no erythema   Foot exam shows callus formation noted over the medial first toe.  Just distal to the MTP.  Bilateral knee  patellofemoral joint.  Positive grind test noted.  No significant Instability of the knees  Limited muscular skeletal ultrasound was performed and interpreted by CLAUDENE HUSSAR, M  Limited ultrasound of patient's patellofemoral joint does show narrowing bilaterally with trace hypoechoic changes.  Lateral is worse than medial.   Impression and Recommendations:     The above documentation has been reviewed and is accurate and complete Dael Howland M Shavell Nored, DO

## 2024-04-19 ENCOUNTER — Ambulatory Visit: Admitting: Family Medicine

## 2024-04-19 ENCOUNTER — Other Ambulatory Visit: Payer: Self-pay

## 2024-04-19 ENCOUNTER — Encounter: Payer: Self-pay | Admitting: Family Medicine

## 2024-04-19 VITALS — BP 118/76 | HR 74 | Ht 66.0 in | Wt 133.0 lb

## 2024-04-19 DIAGNOSIS — M1711 Unilateral primary osteoarthritis, right knee: Secondary | ICD-10-CM

## 2024-04-19 DIAGNOSIS — M25561 Pain in right knee: Secondary | ICD-10-CM

## 2024-04-19 DIAGNOSIS — G8929 Other chronic pain: Secondary | ICD-10-CM

## 2024-04-19 DIAGNOSIS — M79674 Pain in right toe(s): Secondary | ICD-10-CM

## 2024-04-19 DIAGNOSIS — M25562 Pain in left knee: Secondary | ICD-10-CM | POA: Diagnosis not present

## 2024-04-19 NOTE — Assessment & Plan Note (Signed)
 Bilateral, worsening previous years we saw her which was greater than 4 years ago.  We discussed different treatment options and patient has elected to go to formal physical therapy.  I think it will be beneficial.  Discussed with patient that she is already doing some of that for her back.  Did have the fusion.  Can be contributing to some of the weakness in the lower extremities as well.  Patient wants to avoid injections at this time or other medications.  Discussed vitamin supplementations that could be helpful though.  Follow-up with me again in 2 to 3 months

## 2024-04-19 NOTE — Patient Instructions (Addendum)
 Wart remover cream with bandaid for one week Do not lace last eye of shoes PT [facility] Total Orthotics Tart Cherry 3600mg  at night See you again in 10-12 weeks

## 2024-04-25 ENCOUNTER — Other Ambulatory Visit (HOSPITAL_COMMUNITY): Payer: Self-pay

## 2024-04-25 MED ORDER — IBUPROFEN 600 MG PO TABS
600.0000 mg | ORAL_TABLET | Freq: Three times a day (TID) | ORAL | 1 refills | Status: AC
  Filled 2024-04-25: qty 270, 90d supply, fill #0

## 2024-04-25 MED ORDER — METHOCARBAMOL 500 MG PO TABS
500.0000 mg | ORAL_TABLET | Freq: Three times a day (TID) | ORAL | 1 refills | Status: AC | PRN
  Filled 2024-04-25: qty 270, 90d supply, fill #0

## 2024-04-25 MED ORDER — DOXEPIN HCL 10 MG PO CAPS
20.0000 mg | ORAL_CAPSULE | Freq: Every day | ORAL | 1 refills | Status: AC | PRN
  Filled 2024-04-25: qty 180, 90d supply, fill #0

## 2024-04-25 MED ORDER — OXYCODONE-ACETAMINOPHEN 10-325 MG PO TABS
1.0000 | ORAL_TABLET | ORAL | 0 refills | Status: AC | PRN
  Filled 2024-04-27: qty 180, 30d supply, fill #0

## 2024-04-27 ENCOUNTER — Other Ambulatory Visit (HOSPITAL_COMMUNITY): Payer: Self-pay

## 2024-05-02 ENCOUNTER — Ambulatory Visit: Admitting: Family Medicine

## 2024-05-09 ENCOUNTER — Encounter (HOSPITAL_COMMUNITY): Payer: Self-pay | Admitting: Urology

## 2024-05-09 ENCOUNTER — Other Ambulatory Visit (HOSPITAL_COMMUNITY): Payer: Self-pay | Admitting: Urology

## 2024-05-09 DIAGNOSIS — Q644 Malformation of urachus: Secondary | ICD-10-CM

## 2024-05-17 ENCOUNTER — Encounter (HOSPITAL_COMMUNITY): Payer: Self-pay

## 2024-05-17 ENCOUNTER — Ambulatory Visit (HOSPITAL_COMMUNITY)
Admission: RE | Admit: 2024-05-17 | Discharge: 2024-05-17 | Disposition: A | Discharge status: Home / Self Care | Source: Ambulatory Visit | Attending: Urology | Admitting: Urology

## 2024-05-17 DIAGNOSIS — Q644 Malformation of urachus: Secondary | ICD-10-CM

## 2024-05-17 MED ORDER — SODIUM CHLORIDE 0.9 % IV SOLN
INTRAVENOUS | Status: AC
  Filled 2024-05-17: qty 500

## 2024-05-23 ENCOUNTER — Other Ambulatory Visit (HOSPITAL_COMMUNITY): Payer: Self-pay

## 2024-05-23 MED ORDER — OXYCODONE-ACETAMINOPHEN 10-325 MG PO TABS
1.0000 | ORAL_TABLET | ORAL | 0 refills | Status: AC | PRN
  Filled 2024-05-28: qty 180, 30d supply, fill #0

## 2024-05-24 ENCOUNTER — Other Ambulatory Visit (HOSPITAL_COMMUNITY): Payer: Self-pay

## 2024-05-28 ENCOUNTER — Other Ambulatory Visit (HOSPITAL_COMMUNITY): Payer: Self-pay

## 2024-06-07 ENCOUNTER — Ambulatory Visit: Admitting: Internal Medicine

## 2024-06-07 ENCOUNTER — Encounter: Payer: Self-pay | Admitting: Internal Medicine

## 2024-06-07 VITALS — BP 104/72 | HR 69 | Ht 66.0 in | Wt 133.2 lb

## 2024-06-07 DIAGNOSIS — I2583 Coronary atherosclerosis due to lipid rich plaque: Secondary | ICD-10-CM | POA: Diagnosis not present

## 2024-06-07 DIAGNOSIS — E559 Vitamin D deficiency, unspecified: Secondary | ICD-10-CM

## 2024-06-07 DIAGNOSIS — F5101 Primary insomnia: Secondary | ICD-10-CM

## 2024-06-07 MED ORDER — DOXEPIN HCL 10 MG PO CAPS: 10.0000 mg | ORAL_CAPSULE | Freq: Every evening | ORAL | 1 refills | Status: AC | PRN

## 2024-06-07 MED ORDER — ZALEPLON 10 MG PO CAPS: 10.0000 mg | ORAL_CAPSULE | Freq: Every evening | ORAL | 1 refills | Status: AC | PRN

## 2024-06-07 NOTE — Assessment & Plan Note (Signed)
"   Coronary calcium CT score is 21.1.  "

## 2024-06-07 NOTE — Assessment & Plan Note (Signed)
 Chronic  On doxepin .  Zaleplon  prn   Potential benefits of a long term benzodiazepines  use as well as potential risks  and complications were explained to the patient and were aknowledged.

## 2024-06-07 NOTE — Progress Notes (Signed)
 "  Subjective:  Patient ID: Rebecca Marks, female    DOB: Mar 07, 1956  Age: 69 y.o. MRN: [id number]  CC: Medical Management of Chronic Issues (5 Month follow up)   HPI Clarisa S Monday presents for insomnia, LBP - better overall, anemia  Outpatient Medications Prior to Visit  Medication Sig Dispense Refill   albuterol  (VENTOLIN  HFA) 108 (90 Base) MCG/ACT inhaler Inhale 2 puffs into the lungs every 4 (four) hours as needed for wheezing or shortness of breath. 8 each 5   b complex vitamins capsule Take 1 capsule by mouth daily.     bisacodyl  (DULCOLAX) 5 MG EC tablet Take 5 mg by mouth daily as needed for moderate constipation.     Cholecalciferol (VITAMIN D3) 125 MCG (5000 UT) CAPS Take 10,000 Units by mouth every evening.     CORAL CALCIUM PO Take 1 Dose by mouth. Ionic Calcium (Mixed in bottle of water--sand formulation)     diclofenac  (VOLTAREN ) 75 MG EC tablet TAKE 1 TABLET BY MOUTH TWICE A DAY 60 tablet 3   diclofenac  (VOLTAREN ) 75 MG EC tablet Take 1 tablet (75 mg total) by mouth 2 (two) times daily. 60 tablet 2   diclofenac  (VOLTAREN ) 75 MG EC tablet Take 1 tablet (75 mg total) by mouth 2 (two) times daily. 60 tablet 2   doxepin  (SINEQUAN ) 10 MG capsule Take 2 capsules (20 mg total) by mouth daily as needed. 180 capsule 1   doxepin  (SINEQUAN ) 10 MG capsule Take 2 capsules (20 mg total) by mouth daily as needed. 180 capsule 1   famotidine  (PEPCID ) 40 MG tablet TAKE 1 TABLET BY MOUTH EVERY DAY 90 tablet 3   famotidine  (PEPCID ) 40 MG tablet Take 1 tablet (40 mg total) by mouth daily. 30 tablet 3   furosemide  (LASIX ) 20 MG tablet Take 1 tablet (20 mg total) by mouth daily. (Patient taking differently: Take 20 mg by mouth daily as needed for fluid.) 30 tablet 5   ibuprofen  (ADVIL ) 600 MG tablet Take 1 tablet (600 mg total) by mouth 3 (three) times daily. Stop Voltaren /diclofenac . 270 tablet 1   magnesium hydroxide (MILK OF MAGNESIA) 400 MG/5ML suspension Take 15 mLs by mouth daily  as needed for mild constipation.     methocarbamol  (ROBAXIN ) 500 MG tablet Take 1 tablet (500 mg total) by mouth 3 (three) times daily as needed. 90 tablet 1   methocarbamol  (ROBAXIN ) 500 MG tablet Take 1 tablet (500 mg total) by mouth 3 (three) times daily as needed. 90 tablet 1   methocarbamol  (ROBAXIN ) 500 MG tablet Take 1 tablet (500 mg total) by mouth 3 (three) times daily as needed. 90 tablet 1   methocarbamol  (ROBAXIN ) 500 MG tablet Take 1 tablet (500 mg total) by mouth 3 (three) times daily as needed. 270 tablet 1   oxyCODONE -acetaminophen  (PERCOCET) 10-325 MG tablet Take 1 tablet by mouth 4 times daily as needed  for pain 120 tablet 0   oxyCODONE -acetaminophen  (PERCOCET) 10-325 MG tablet Take 1 tablet by mouth every 4 (four) hours as needed. 180 tablet 0   oxyCODONE -acetaminophen  (PERCOCET) 10-325 MG tablet Take 1 tablet by mouth every 4 (four) hours as needed for pain 180 tablet 0   oxyCODONE -acetaminophen  (PERCOCET) 10-325 MG tablet Take 1 tablet by mouth every 4 (four) hours as needed for pain 180 tablet 0   oxyCODONE -acetaminophen  (PERCOCET) 10-325 MG tablet Take 1 tablet by mouth every 4 (four) hours as needed. 180 tablet 0   oxyCODONE -acetaminophen  (PERCOCET) 10-325 MG tablet Take  1 tablet by mouth every 4 (four) hours as needed for pain 180 tablet 0   PHOSPHATIDYLSERINE PO Take 300 mg by mouth at bedtime.     psyllium (HYDROCIL/METAMUCIL) 95 % PACK Take 1 packet by mouth daily.     VOLTAREN  1 % GEL Apply 1 application topically in the morning and at bedtime. Applied to both knees and lower back     doxepin  (SINEQUAN ) 10 MG capsule TAKE 1 CAPSULE BY MOUTH EVERY DAY AT BEDTIME AS NEEDED (Patient taking differently: Take 10 mg by mouth at bedtime.) 90 capsule 1   zaleplon  (SONATA ) 10 MG capsule Take 1 capsule (10 mg total) by mouth at bedtime as needed for sleep. 90 capsule 1   No facility-administered medications prior to visit.    ROS: Review of Systems  Constitutional:  Negative  for activity change, appetite change, chills, fatigue and unexpected weight change.  HENT:  Negative for congestion, mouth sores and sinus pressure.   Eyes:  Negative for visual disturbance.  Respiratory:  Negative for cough and chest tightness.   Gastrointestinal:  Negative for abdominal pain and nausea.  Genitourinary:  Negative for difficulty urinating, frequency and vaginal pain.  Musculoskeletal:  Positive for arthralgias, back pain and gait problem.  Skin:  Negative for pallor and rash.  Neurological:  Negative for dizziness, tremors, weakness, numbness and headaches.  Psychiatric/Behavioral:  Negative for confusion and sleep disturbance.     Objective:  BP 104/72   Pulse 69   Ht 5' 6 (1.676 m)   Wt 133 lb 3.2 oz (60.4 kg)   BMI 21.50 kg/m   BP Readings from Last 3 Encounters:  06/07/24 104/72  04/19/24 118/76  03/08/24 (!) 111/58    Wt Readings from Last 3 Encounters:  06/07/24 133 lb 3.2 oz (60.4 kg)  04/19/24 133 lb (60.3 kg)  03/08/24 133 lb (60.3 kg)    Physical Exam Constitutional:      General: She is not in acute distress.    Appearance: She is well-developed.  HENT:     Head: Normocephalic.     Right Ear: External ear normal.     Left Ear: External ear normal.     Nose: Nose normal.  Eyes:     General:        Right eye: No discharge.        Left eye: No discharge.     Conjunctiva/sclera: Conjunctivae normal.     Pupils: Pupils are equal, round, and reactive to light.  Neck:     Thyroid : No thyromegaly.     Vascular: No JVD.     Trachea: No tracheal deviation.  Cardiovascular:     Rate and Rhythm: Normal rate and regular rhythm.     Heart sounds: Normal heart sounds.  Pulmonary:     Effort: No respiratory distress.     Breath sounds: No stridor. No wheezing.  Abdominal:     General: Bowel sounds are normal. There is no distension.     Palpations: Abdomen is soft. There is no mass.     Tenderness: There is no abdominal tenderness. There is no  guarding or rebound.  Musculoskeletal:        General: No tenderness.     Cervical back: Normal range of motion and neck supple. No rigidity.     Right lower leg: No edema.     Left lower leg: No edema.  Lymphadenopathy:     Cervical: No cervical adenopathy.  Skin:  Findings: No erythema or rash.  Neurological:     Mental Status: She is oriented to person, place, and time.     Cranial Nerves: No cranial nerve deficit.     Motor: No abnormal muscle tone.     Coordination: Coordination abnormal.     Gait: Gait abnormal.     Deep Tendon Reflexes: Reflexes normal.  Psychiatric:        Behavior: Behavior normal.        Thought Content: Thought content normal.        Judgment: Judgment normal.   Using a cane LS w/pain  Lab Results  Component Value Date   WBC 7.2 10/30/2022   HGB 13.4 10/30/2022   HCT 38.1 10/30/2022   PLT 253 10/30/2022   GLUCOSE 94 11/07/2022   CHOL 164 08/26/2021   TRIG 344.0 (H) 08/26/2021   HDL 38.00 (L) 08/26/2021   LDLDIRECT 92.0 08/26/2021   LDLCALC 90 04/21/2018   ALT 4 08/26/2021   AST 15 08/26/2021   NA 138 11/07/2022   K 4.2 11/07/2022   CL 99 11/07/2022   CREATININE 0.89 11/07/2022   BUN 17 11/07/2022   CO2 30 11/07/2022   TSH 1.525 10/30/2022   INR 1.0 09/12/2015   HGBA1C 4.4 (L) 07/30/2006    No results found.  Assessment & Plan:   Problem List Items Addressed This Visit     Vitamin D  deficiency   On Vit D [place] calcium      Insomnia - Primary   Chronic  On doxepin .  Zaleplon  prn   Potential benefits of a long term benzodiazepines  use as well as potential risks  and complications were explained to the patient and were aknowledged.      Coronary atherosclerosis    Coronary calcium CT score is 21.1.          Meds ordered this encounter  Medications   doxepin  (SINEQUAN ) 10 MG capsule    Sig: Take 1 capsule (10 mg total) by mouth at bedtime as needed.    Dispense:  90 capsule    Refill:  1   zaleplon  (SONATA ) 10  MG capsule    Sig: Take 1 capsule (10 mg total) by mouth at bedtime as needed for sleep.    Dispense:  90 capsule    Refill:  1      Follow-up: Return in about 6 months (around 12/05/2024) for a follow-up visit.  Marolyn Noel, MD "

## 2024-06-07 NOTE — Assessment & Plan Note (Signed)
 On Vit D [place] calcium

## 2024-06-13 NOTE — Progress Notes (Unsigned)
 "  [facility]  PATIENT: Rebecca Marks DOB: [birth year]  REFERRING DOCTOR OR PCP: Dr. Garald SOURCE: Patient, notes from primary care  _________________________________   HISTORICAL  CHIEF COMPLAINT:  Chief Complaint  Patient presents with   New Patient (Initial Visit)    Rm10, alone, internal referral for neuropathic pain: pt declined eye exam     HISTORY OF PRESENT ILLNESS:  I had the pleasure seeing your patient, Rebecca Marks, at [facility] for neurologic consultation regarding her leg pain.  She is a 69 year old woman with a long history of pain related to lumbar spine degenerative issues reporting bilateral leg pain in the lower leg sparing the upper leg and feet..  She had lumbar fusion at L4L5 in 01/2021.   In 06/2023, she had an MRI showing adjacent L3L4 severe spinal stenosis that had markedly progressed compared to an MRI from 2024.  This level was fused 08/2023.  Currently, she is experiencing pain in the lower lumbar/sacral area that has worsened compared to mid 2025.   Additionally, she has pain in the lower leg but not the upper leg or foot.  She notes that she was experiencing foot pain that resolved after the second lumbar fusion 08/2023.    She is doing P and notes of benefit.  T.    She is doing an U/S treatment at PT and she reports being told she had 6 knots in the lower back.    She feels her pain is actually little bit better over the past 2 months since doing the physical therapy  She is seen at a pain clinic and is on oxycodone , methocarbamol  and diclofenac .  In the past she was on gabapentin  but it did not help at 300 mg tid.  Duloxetine  had not helped.    Methocarbamol  helped more than cyclobenzaprine .      She also has neck pain.   She has urinary urgency but no incontinence.   This has worsened the last couple years.  She can walk without a cane but notes less pain when she uses the cane  Imaging: MRI of the  lumbar spine 07/15/2023 showed L4-L5 there is remote laminectomy with posterior lumbar interbody fusion.   There is moderately severe spinal stenosis at L3-L4 (AP diameter 6.5 mm due to disc protrusion, facet hypertrophy and ligamenta flava hypertrophy.  This appeared to have progressed compared to the MRI from 08/04/2022.  Neuroforamina are just minimally narrowed.  There is mild lateral recess stenosis though there does not appear to be nerve root compression.  REVIEW OF SYSTEMS: Constitutional: No fevers, chills, sweats, or change in appetite Eyes: No visual changes, double vision, eye pain Ear, nose and throat: No hearing loss, ear pain, nasal congestion, sore throat Cardiovascular: No chest pain, palpitations Respiratory:  No shortness of breath at rest or with exertion.   No wheezes GastrointestinaI: No nausea, vomiting, diarrhea, abdominal pain, fecal incontinence Genitourinary: She has urinary frequency/urgency. Musculoskeletal: As above  integumentary: No rash, pruritus, skin lesions Neurological: as above Psychiatric: No depression at this time.  No anxiety Endocrine: No palpitations, diaphoresis, change in appetite, change in weigh or increased thirst Hematologic/Lymphatic:  No anemia, purpura, petechiae. Allergic/Immunologic: No itchy/runny eyes, nasal congestion, recent allergic reactions, rashes  ALLERGIES: Allergies[1]  HOME MEDICATIONS: Current Medications[2]  PAST MEDICAL HISTORY: Past Medical History:  Diagnosis Date   Allergy    Arthritis    R wrist and R knee   CAP (community acquired pneumonia) 4/17-22/15  Rochephin & IV Zithromax  & 3 days Levaquin    Chronic back pain    Clotting disorder    DVT (deep venous thrombosis) (HCC)    a. in her 20s while on OCPs.   Dyspnea    GERD (gastroesophageal reflux disease)    Hepatitis C    contaminated blood @ her job, prior notes indicate spontaneously cleared   History of tobacco abuse    Osteoarthritis     Palpitations    Premature atrial contractions    a. Holter 11/2013: few PACs.   Pulmonary function study abnormality    a. 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   Raynaud phenomenon    Ruptured lumbar disc    4 herniated disc    Volume overload    a.  history of volume overload during hospital stay for PNA in 08/2013 felt iatrogenic due to receiving a lot of IV fluid. 2D echo 08/2013 showed EF 60-65%, normal diastolic function parameters.    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    FAMILY HISTORY: Family History  Problem Relation Age of Onset   Dementia Mother    Stroke Mother 8       after bedridden post fall with BLE paralysis   Breast cancer Mother         X 3   Diabetes Father    Hypertension Father    Heart attack Father        >55   Lymphoma Father    Heart disease Father    Breast cancer Sister    Alzheimer's disease Maternal Grandmother    Stroke Maternal Grandfather        in 39s   Colon cancer Neg Hx    Colon polyps Neg Hx    Esophageal cancer Neg Hx    Rectal cancer Neg Hx    Stomach cancer Neg Hx     SOCIAL HISTORY: Social History   Socioeconomic History   Marital status: Married    Spouse name: Not on file   Number of children: Not on file   Years of education: Not on file   Highest education level: Not on file  Occupational History   Not on file  Tobacco Use   Smoking status: Former    Current packs/day: 0.00    Average packs/day: 0.5 packs/day for 25.0 years (12.5 ttl pk-yrs)    Types: Cigarettes    Start date: 08/19/1988    Quit date: 08/19/2013    Years since quitting: 10.8   Smokeless tobacco: Never   Tobacco comments:    smoked 1979-2008, up to 1 ppd. As of 08/01/13 1 cig every 2 weeks  Vaping Use   Vaping status: Never Used  Substance and Sexual Activity   Alcohol use: Yes     Alcohol/week: 1.0 standard drink of alcohol    Types: 1 Standard drinks or equivalent per week    Comment: 1 Beer nightly   Drug use: No   Sexual activity: Not Currently    Birth control/protection: None  Other Topics Concern   Not on file  Social History Narrative   She lives with husband. She does not have children.   She is a retired engineer, civil (consulting).    She lives in 3 story home.    Social Drivers of Health   Tobacco Use:  Medium Risk (06/14/2024)   Patient History    Smoking Tobacco Use: Former    Smokeless Tobacco Use: Never    Passive Exposure: Not on file  Financial Resource Strain: Low Risk (12/10/2023)   Overall Financial Resource Strain (CARDIA)    Difficulty of Paying Living Expenses: Not very hard  Food Insecurity: No Food Insecurity (12/10/2023)   Epic    Worried About Programme Researcher, Broadcasting/film/video in the Last Year: Never true    Ran Out of Food in the Last Year: Never true  Transportation Needs: No Transportation Needs (12/10/2023)   Epic    Lack of Transportation (Medical): No    Lack of Transportation (Non-Medical): No  Physical Activity: Insufficiently Active (12/10/2023)   Exercise Vital Sign    Days of Exercise per Week: 5 days    Minutes of Exercise per Session: 20 min  Stress: Stress Concern Present (12/10/2023)   [employer] of Occupational Health - Occupational Stress Questionnaire    Feeling of Stress: To some extent  Social Connections: Moderately Integrated (12/10/2023)   Social Connection and Isolation Panel    Frequency of Communication with Friends and Family: More than three times a week    Frequency of Social Gatherings with Friends and Family: Once a week    Attends Religious Services: Never    Database Administrator or Organizations: No    Attends Banker Meetings: 1 to 4 times per year    Marital Status: Married  Catering Manager Violence: Not At Risk (12/10/2023)   Epic    Fear of Current or Ex-Partner: No    Emotionally Abused: No     Physically Abused: No    Sexually Abused: No  Depression (PHQ2-9): Low Risk (12/10/2023)   Depression (PHQ2-9)    PHQ-2 Score: 2  Alcohol Screen: Low Risk (12/10/2023)   Alcohol Screen    Last Alcohol Screening Score (AUDIT): 2  Housing: Unknown (12/10/2023)   Epic    Unable to Pay for Housing in the Last Year: No    Number of Times Moved in the Last Year: Not on file    Homeless in the Last Year: No  Utilities: Not At Risk (12/10/2023)   Epic    Threatened with loss of utilities: No  Health Literacy: Adequate Health Literacy (12/10/2023)   B1300 Health Literacy    Frequency of need for help with medical instructions: Never       PHYSICAL EXAM  Vitals:   06/14/24 1439  BP: 117/75  Pulse: 65  Resp: 16  Weight: 135 lb (61.2 kg)  Height: 5' 6.5 (1.689 m)    Body mass index is 21.46 kg/m.   General: The patient is well-developed and well-nourished and in no acute distress  HEENT:  Head is [place]/AT.  Sclera are anicteric.    Neck: No carotid bruits are noted.  The neck is nontender.  Cardiovascular: The heart has a regular rate and rhythm with a normal S1 and S2. There were no murmurs, gallops or rubs.    Skin: Extremities are without rash or  edema.  Musculoskeletal:  Back is nontender  Neurologic Exam  Mental status: The patient is alert and oriented x 3 at the time of the examination. The patient has apparent normal recent and remote memory, with an apparently normal attention span and concentration ability.   Speech is normal.  Cranial nerves: Extraocular movements are full. Pupils are equal, round, and reactive to light and accomodation.  Visual fields are  full.  Facial symmetry is present. There is good facial sensation to soft touch bilaterally.Facial strength is normal.  Trapezius and sternocleidomastoid strength is normal. No dysarthria is noted.  The tongue is midline, and the patient has symmetric elevation of the soft palate. No obvious hearing deficits are  noted.  Motor:  Muscle bulk is normal.   Tone is normal. Strength is  5 / 5 in all 4 extremities.   Sensory: Sensory testing is intact to pinprick, soft touch and vibration sensation in the arms and legs.  Sensation in the foot was equal to sensation above the ankles  Coordination: Cerebellar testing reveals good finger-nose-finger and heel-to-shin bilaterally.  Gait and station: Station is normal.   Gait is arthritic. Tandem gait is normal. Romberg is negative.   Reflexes: Deep tendon reflexes are symmetric and normal in the arms but increased at the knees and ankles.  There is spread at the ankles.  She had 2 beats of nonsustained clonus in the left ankle.   Plantar responses are flexor.    DIAGNOSTIC DATA (LABS, IMAGING, TESTING) - I reviewed patient records, labs, notes, testing and imaging myself where available.  Lab Results  Component Value Date   WBC 7.2 10/30/2022   HGB 13.4 10/30/2022   HCT 38.1 10/30/2022   MCV 92.0 10/30/2022   PLT 253 10/30/2022      Component Value Date/Time   NA 138 11/07/2022 1017   K 4.2 11/07/2022 1017   CL 99 11/07/2022 1017   CO2 30 11/07/2022 1017   GLUCOSE 94 11/07/2022 1017   BUN 17 11/07/2022 1017   CREATININE 0.89 11/07/2022 1017   CALCIUM 9.5 11/07/2022 1017   PROT 7.2 08/26/2021 1421   ALBUMIN 4.8 08/26/2021 1421   AST 15 08/26/2021 1421   ALT 4 08/26/2021 1421   ALKPHOS 68 08/26/2021 1421   BILITOT 0.5 08/26/2021 1421   GFRNONAA >60 11/07/2022 1017   GFRAA >90 09/04/2013 0633   Lab Results  Component Value Date   CHOL 164 08/26/2021   HDL 38.00 (L) 08/26/2021   LDLCALC 90 04/21/2018   LDLDIRECT 92.0 08/26/2021   TRIG 344.0 (H) 08/26/2021   CHOLHDL 4 08/26/2021   Lab Results  Component Value Date   HGBA1C 4.4 (L) 07/30/2006   Lab Results  Component Value Date   VITAMINB12 341 07/08/2018   Lab Results  Component Value Date   TSH 1.525 10/30/2022       ASSESSMENT AND PLAN  Dysesthesia - Plan: Vitamin B12,  MR CERVICAL SPINE WO CONTRAST  Gait disturbance - Plan: Vitamin B12, MR CERVICAL SPINE WO CONTRAST  Numbness - Plan: Vitamin B12, MR CERVICAL SPINE WO CONTRAST  History of lumbar fusion  Urinary urgency - Plan: MR CERVICAL SPINE WO CONTRAST  In summary, she is a 69 year old woman with a long history of back and leg pain currently experiencing pain between the knee and ankles of both legs.  Foot pain that she was experiencing before her last surgery has resolved.  Pain is fairly stable and, if anything, maybe a little bit better over the last couple months.  Her neurologic examination showed normal sensation in the arms and legs, including the feet.  Therefore, polyneuropathy is unlikely.  She did have increased reflexes at the knees and ankles.  Since she also has neck pain and urinary urgency, we need to consider cervical spinal stenosis could be causing some of her symptoms.  Therefore, we will check an MRI of the cervical spine.  I will also check blood for vitamin B12.  She had not been able to tolerate Lyrica  and gabapentin  had not helped.  Could consider some changes in medication such as swapping her muscle relaxant to tizanidine that might have more benefit.  Additionally, consider lamotrigine for the dysesthetic pain.  Since there is no evidence of polyneuropathy, we do not need to do NCV/EMG at this time.  I did not schedule a follow-up but she will call if she notes significant new or worsening neurologic symptoms.  Thank you for asking me to see this patient.  Please let me know if I can be of further assistance with her or other patients in the future.  Satrina Magallanes A. Vear, MD, [facility] 06/14/2024, 2:52 PM Certified in Neurology, Clinical Neurophysiology, Sleep Medicine and Neuroimaging  [facility] [address] [place], [place] [address] [phone]     [1]  Allergies Allergen Reactions   Risperidone Other (See Comments)    Other Reaction(s):  nervous/hyperactive   Ritalin  [Methylphenidate  Hcl] Other (See Comments)    Too hyper   Venlafaxine  Other (See Comments)    GERD   Cymbalta  [Duloxetine  Hcl] Other (See Comments)    Unwilling to take  [2]  Current Outpatient Medications:    albuterol  (VENTOLIN  HFA) 108 (90 Base) MCG/ACT inhaler, Inhale 2 puffs into the lungs every 4 (four) hours as needed for wheezing or shortness of breath., Disp: 8 each, Rfl: 5   Cholecalciferol (VITAMIN D3) 125 MCG (5000 UT) CAPS, Take 10,000 Units by mouth every evening., Disp: , Rfl:    CORAL CALCIUM PO, Take 1 Dose by mouth. Ionic Calcium (Mixed in bottle of water--sand formulation), Disp: , Rfl:    diclofenac  (VOLTAREN ) 75 MG EC tablet, TAKE 1 TABLET BY MOUTH TWICE A DAY, Disp: 60 tablet, Rfl: 3   doxepin  (SINEQUAN ) 10 MG capsule, Take 2 capsules (20 mg total) by mouth daily as needed., Disp: 180 capsule, Rfl: 1   ibuprofen  (ADVIL ) 600 MG tablet, Take 1 tablet (600 mg total) by mouth 3 (three) times daily. Stop Voltaren /diclofenac ., Disp: 270 tablet, Rfl: 1   magnesium hydroxide (MILK OF MAGNESIA) 400 MG/5ML suspension, Take 15 mLs by mouth daily as needed for mild constipation., Disp: , Rfl:    methocarbamol  (ROBAXIN ) 500 MG tablet, Take 1 tablet (500 mg total) by mouth 3 (three) times daily as needed., Disp: 90 tablet, Rfl: 1   oxyCODONE -acetaminophen  (PERCOCET) 10-325 MG tablet, Take 1 tablet by mouth every 4 (four) hours as needed for pain, Disp: 180 tablet, Rfl: 0   b complex vitamins capsule, Take 1 capsule by mouth daily. (Patient not taking: Reported on 06/14/2024), Disp: , Rfl:    bisacodyl  (DULCOLAX) 5 MG EC tablet, Take 5 mg by mouth daily as needed for moderate constipation. (Patient not taking: Reported on 06/14/2024), Disp: , Rfl:    diclofenac  (VOLTAREN ) 75 MG EC tablet, Take 1 tablet (75 mg total) by mouth 2 (two) times daily. (Patient not taking: Reported on 06/14/2024), Disp: 60 tablet, Rfl: 2   diclofenac  (VOLTAREN ) 75 MG EC tablet, Take 1  tablet (75 mg total) by mouth 2 (two) times daily. (Patient not taking: Reported on 06/14/2024), Disp: 60 tablet, Rfl: 2   doxepin  (SINEQUAN ) 10 MG capsule, Take 2 capsules (20 mg total) by mouth daily as needed. (Patient not taking: Reported on 06/14/2024), Disp: 180 capsule, Rfl: 1   doxepin  (SINEQUAN ) 10 MG capsule, Take 1 capsule (10 mg total) by mouth at bedtime as needed. (  Patient not taking: Reported on 06/14/2024), Disp: 90 capsule, Rfl: 1   famotidine  (PEPCID ) 40 MG tablet, TAKE 1 TABLET BY MOUTH EVERY DAY (Patient not taking: Reported on 06/14/2024), Disp: 90 tablet, Rfl: 3   famotidine  (PEPCID ) 40 MG tablet, Take 1 tablet (40 mg total) by mouth daily. (Patient not taking: Reported on 06/14/2024), Disp: 30 tablet, Rfl: 3   furosemide  (LASIX ) 20 MG tablet, Take 1 tablet (20 mg total) by mouth daily. (Patient not taking: Reported on 06/14/2024), Disp: 30 tablet, Rfl: 5   methocarbamol  (ROBAXIN ) 500 MG tablet, Take 1 tablet (500 mg total) by mouth 3 (three) times daily as needed. (Patient not taking: Reported on 06/14/2024), Disp: 90 tablet, Rfl: 1   methocarbamol  (ROBAXIN ) 500 MG tablet, Take 1 tablet (500 mg total) by mouth 3 (three) times daily as needed. (Patient not taking: Reported on 06/14/2024), Disp: 90 tablet, Rfl: 1   methocarbamol  (ROBAXIN ) 500 MG tablet, Take 1 tablet (500 mg total) by mouth 3 (three) times daily as needed. (Patient not taking: Reported on 06/14/2024), Disp: 270 tablet, Rfl: 1   oxyCODONE -acetaminophen  (PERCOCET) 10-325 MG tablet, Take 1 tablet by mouth 4 times daily as needed  for pain (Patient not taking: Reported on 06/14/2024), Disp: 120 tablet, Rfl: 0   oxyCODONE -acetaminophen  (PERCOCET) 10-325 MG tablet, Take 1 tablet by mouth every 4 (four) hours as needed. (Patient not taking: Reported on 06/14/2024), Disp: 180 tablet, Rfl: 0   oxyCODONE -acetaminophen  (PERCOCET) 10-325 MG tablet, Take 1 tablet by mouth every 4 (four) hours as needed for pain (Patient not taking: Reported  on 06/14/2024), Disp: 180 tablet, Rfl: 0   oxyCODONE -acetaminophen  (PERCOCET) 10-325 MG tablet, Take 1 tablet by mouth every 4 (four) hours as needed for pain (Patient not taking: Reported on 06/14/2024), Disp: 180 tablet, Rfl: 0   oxyCODONE -acetaminophen  (PERCOCET) 10-325 MG tablet, Take 1 tablet by mouth every 4 (four) hours as needed. (Patient not taking: Reported on 06/14/2024), Disp: 180 tablet, Rfl: 0   PHOSPHATIDYLSERINE PO, Take 300 mg by mouth at bedtime. (Patient not taking: Reported on 06/14/2024), Disp: , Rfl:    psyllium (HYDROCIL/METAMUCIL) 95 % PACK, Take 1 packet by mouth daily. (Patient not taking: Reported on 06/14/2024), Disp: , Rfl:    VOLTAREN  1 % GEL, Apply 1 application topically in the morning and at bedtime. Applied to both knees and lower back (Patient not taking: Reported on 06/14/2024), Disp: , Rfl:    zaleplon  (SONATA ) 10 MG capsule, Take 1 capsule (10 mg total) by mouth at bedtime as needed for sleep. (Patient not taking: Reported on 06/14/2024), Disp: 90 capsule, Rfl: 1  "

## 2024-06-14 ENCOUNTER — Ambulatory Visit: Admitting: Neurology

## 2024-06-14 ENCOUNTER — Encounter: Payer: Self-pay | Admitting: Neurology

## 2024-06-14 VITALS — BP 117/75 | HR 65 | Resp 16 | Ht 66.5 in | Wt 135.0 lb

## 2024-06-14 DIAGNOSIS — R292 Abnormal reflex: Secondary | ICD-10-CM | POA: Diagnosis not present

## 2024-06-14 DIAGNOSIS — Z981 Arthrodesis status: Secondary | ICD-10-CM

## 2024-06-14 DIAGNOSIS — M542 Cervicalgia: Secondary | ICD-10-CM | POA: Diagnosis not present

## 2024-06-14 DIAGNOSIS — R208 Other disturbances of skin sensation: Secondary | ICD-10-CM

## 2024-06-14 DIAGNOSIS — R3915 Urgency of urination: Secondary | ICD-10-CM | POA: Diagnosis not present

## 2024-06-14 DIAGNOSIS — R200 Anesthesia of skin: Secondary | ICD-10-CM | POA: Diagnosis not present

## 2024-06-14 DIAGNOSIS — R269 Unspecified abnormalities of gait and mobility: Secondary | ICD-10-CM | POA: Diagnosis not present

## 2024-06-15 ENCOUNTER — Telehealth: Payer: Self-pay | Admitting: Neurology

## 2024-06-15 ENCOUNTER — Ambulatory Visit: Payer: Self-pay | Admitting: Neurology

## 2024-06-15 LAB — VITAMIN B12: Vitamin B-12: 634 pg/mL (ref 232–1245)

## 2024-06-15 NOTE — Telephone Encounter (Signed)
 no auth required sent to [facility] [phone]

## 2024-06-16 ENCOUNTER — Other Ambulatory Visit (HOSPITAL_COMMUNITY): Payer: Self-pay

## 2024-06-16 ENCOUNTER — Other Ambulatory Visit: Payer: Self-pay

## 2024-06-20 ENCOUNTER — Other Ambulatory Visit (HOSPITAL_COMMUNITY): Payer: Self-pay

## 2024-06-20 MED ORDER — METHOCARBAMOL 500 MG PO TABS
500.0000 mg | ORAL_TABLET | Freq: Three times a day (TID) | ORAL | 1 refills | Status: AC | PRN
  Filled 2024-06-20: qty 270, 90d supply, fill #0

## 2024-06-20 MED ORDER — OXYCODONE-ACETAMINOPHEN 10-325 MG PO TABS: 1.0000 | ORAL_TABLET | ORAL | 0 refills | Status: AC | PRN

## 2024-07-05 ENCOUNTER — Ambulatory Visit: Admitting: Family Medicine

## 2024-12-06 ENCOUNTER — Ambulatory Visit: Admitting: Internal Medicine

## 2024-12-15 ENCOUNTER — Ambulatory Visit
